# Patient Record
Sex: Female | Born: 1960
Health system: Southern US, Community
[De-identification: ages and names within clinical notes are randomized; demographics above are authoritative.]

## PROBLEM LIST (undated history)

## (undated) DIAGNOSIS — M539 Dorsopathy, unspecified: Secondary | ICD-10-CM

## (undated) DIAGNOSIS — K219 Gastro-esophageal reflux disease without esophagitis: Secondary | ICD-10-CM

## (undated) DIAGNOSIS — R1310 Dysphagia, unspecified: Secondary | ICD-10-CM

## (undated) DIAGNOSIS — Z95828 Presence of other vascular implants and grafts: Secondary | ICD-10-CM

## (undated) DIAGNOSIS — N3020 Other chronic cystitis without hematuria: Secondary | ICD-10-CM

## (undated) DIAGNOSIS — R42 Dizziness and giddiness: Secondary | ICD-10-CM

## (undated) DIAGNOSIS — G35 Multiple sclerosis: Secondary | ICD-10-CM

## (undated) DIAGNOSIS — R1319 Other dysphagia: Secondary | ICD-10-CM

## (undated) DIAGNOSIS — G43909 Migraine, unspecified, not intractable, without status migrainosus: Secondary | ICD-10-CM

## (undated) DIAGNOSIS — Z978 Presence of other specified devices: Secondary | ICD-10-CM

## (undated) DIAGNOSIS — R12 Heartburn: Secondary | ICD-10-CM

## (undated) DIAGNOSIS — I10 Essential (primary) hypertension: Secondary | ICD-10-CM

## (undated) DIAGNOSIS — K5900 Constipation, unspecified: Secondary | ICD-10-CM

## (undated) DIAGNOSIS — G709 Myoneural disorder, unspecified: Secondary | ICD-10-CM

## (undated) DIAGNOSIS — F419 Anxiety disorder, unspecified: Secondary | ICD-10-CM

## (undated) DIAGNOSIS — F329 Major depressive disorder, single episode, unspecified: Secondary | ICD-10-CM

## (undated) DIAGNOSIS — K200 Eosinophilic esophagitis: Secondary | ICD-10-CM

## (undated) DIAGNOSIS — F32A Depression, unspecified: Secondary | ICD-10-CM

## (undated) HISTORY — DX: Other dysphagia: R13.19

## (undated) HISTORY — DX: Heartburn: R12

## (undated) HISTORY — DX: Essential (primary) hypertension: I10

## (undated) HISTORY — DX: Constipation, unspecified: K59.00

## (undated) HISTORY — DX: Dysphagia, unspecified: R13.10

## (undated) HISTORY — DX: Eosinophilic esophagitis: K20.0

## (undated) HISTORY — DX: Major depressive disorder, single episode, unspecified: F32.9

## (undated) HISTORY — DX: Other chronic cystitis without hematuria: N30.20

## (undated) HISTORY — DX: Multiple sclerosis: G35

## (undated) HISTORY — DX: Dorsopathy, unspecified: M53.9

## (undated) HISTORY — DX: Gastro-esophageal reflux disease without esophagitis: K21.9

## (undated) HISTORY — DX: Depression, unspecified: F32.A

---
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
## 1995-12-08 HISTORY — PX: CARPAL TUNNEL RELEASE: SHX101

## 2000-12-07 HISTORY — PX: OVARIAN CYST REMOVAL: SHX89

## 2000-12-07 HISTORY — PX: CYSTECTOMY: SUR359

## 2000-12-07 HISTORY — PX: KIDNEY DONATION: SHX685

## 2001-12-07 HISTORY — PX: ABDOMINAL HYSTERECTOMY: SHX81

## 2012-12-07 HISTORY — PX: FRACTURE SURGERY: SHX138

## 2014-11-19 ENCOUNTER — Ambulatory Visit: Payer: Self-pay | Admitting: Gastroenterology

## 2014-11-19 DIAGNOSIS — K200 Eosinophilic esophagitis: Secondary | ICD-10-CM

## 2014-11-19 HISTORY — DX: Eosinophilic esophagitis: K20.0

## 2014-11-19 HISTORY — PX: UPPER GASTROINTESTINAL ENDOSCOPY: SHX188

## 2014-12-03 ENCOUNTER — Ambulatory Visit: Payer: Self-pay | Admitting: Orthopedic Surgery

## 2014-12-24 DIAGNOSIS — R262 Difficulty in walking, not elsewhere classified: Secondary | ICD-10-CM | POA: Insufficient documentation

## 2014-12-24 DIAGNOSIS — G4700 Insomnia, unspecified: Secondary | ICD-10-CM | POA: Insufficient documentation

## 2014-12-24 DIAGNOSIS — R5382 Chronic fatigue, unspecified: Secondary | ICD-10-CM

## 2014-12-24 DIAGNOSIS — IMO0001 Reserved for inherently not codable concepts without codable children: Secondary | ICD-10-CM | POA: Insufficient documentation

## 2014-12-24 DIAGNOSIS — G4701 Insomnia due to medical condition: Secondary | ICD-10-CM | POA: Insufficient documentation

## 2014-12-24 DIAGNOSIS — D8989 Other specified disorders involving the immune mechanism, not elsewhere classified: Secondary | ICD-10-CM | POA: Insufficient documentation

## 2014-12-24 DIAGNOSIS — R5383 Other fatigue: Secondary | ICD-10-CM | POA: Insufficient documentation

## 2014-12-24 DIAGNOSIS — R413 Other amnesia: Secondary | ICD-10-CM | POA: Insufficient documentation

## 2015-01-07 ENCOUNTER — Ambulatory Visit: Payer: Self-pay | Admitting: Neurology

## 2015-01-16 ENCOUNTER — Emergency Department: Payer: Self-pay | Admitting: Emergency Medicine

## 2015-05-10 ENCOUNTER — Other Ambulatory Visit: Payer: Self-pay

## 2015-05-10 MED ORDER — DEXLANSOPRAZOLE 60 MG PO CPDR: 60.0000 mg | DELAYED_RELEASE_CAPSULE | Freq: Every day | ORAL | Status: DC

## 2015-05-10 NOTE — Telephone Encounter (Signed)
E-prescribe within allscripts had sent auto refill message for this patient. Sent to pharmacy at this time.

## 2015-05-14 ENCOUNTER — Ambulatory Visit: Payer: Self-pay | Admitting: Licensed Clinical Social Worker

## 2015-05-20 ENCOUNTER — Ambulatory Visit: Payer: Self-pay | Admitting: Psychiatry

## 2015-05-21 DIAGNOSIS — K200 Eosinophilic esophagitis: Secondary | ICD-10-CM

## 2015-05-24 ENCOUNTER — Other Ambulatory Visit: Payer: Self-pay

## 2015-05-24 DIAGNOSIS — R1310 Dysphagia, unspecified: Secondary | ICD-10-CM | POA: Insufficient documentation

## 2015-05-24 DIAGNOSIS — G35 Multiple sclerosis: Secondary | ICD-10-CM | POA: Insufficient documentation

## 2015-05-24 DIAGNOSIS — F32A Depression, unspecified: Secondary | ICD-10-CM | POA: Insufficient documentation

## 2015-05-24 DIAGNOSIS — K219 Gastro-esophageal reflux disease without esophagitis: Secondary | ICD-10-CM | POA: Insufficient documentation

## 2015-05-24 DIAGNOSIS — M9979 Connective tissue and disc stenosis of intervertebral foramina of abdomen and other regions: Secondary | ICD-10-CM | POA: Insufficient documentation

## 2015-05-24 DIAGNOSIS — F329 Major depressive disorder, single episode, unspecified: Secondary | ICD-10-CM | POA: Insufficient documentation

## 2015-05-24 DIAGNOSIS — R1319 Other dysphagia: Secondary | ICD-10-CM | POA: Insufficient documentation

## 2015-05-24 DIAGNOSIS — N3020 Other chronic cystitis without hematuria: Secondary | ICD-10-CM | POA: Insufficient documentation

## 2015-05-24 DIAGNOSIS — K5900 Constipation, unspecified: Secondary | ICD-10-CM | POA: Insufficient documentation

## 2015-05-24 DIAGNOSIS — I10 Essential (primary) hypertension: Secondary | ICD-10-CM | POA: Insufficient documentation

## 2015-05-27 ENCOUNTER — Ambulatory Visit: Payer: Self-pay | Admitting: Urgent Care

## 2015-06-04 ENCOUNTER — Ambulatory Visit: Payer: 59 | Admitting: Licensed Clinical Social Worker

## 2015-06-14 ENCOUNTER — Encounter: Payer: Self-pay | Admitting: Urgent Care

## 2015-06-14 ENCOUNTER — Ambulatory Visit (INDEPENDENT_AMBULATORY_CARE_PROVIDER_SITE_OTHER): Payer: 59 | Admitting: Urgent Care

## 2015-06-14 VITALS — BP 125/88 | HR 99 | Temp 98.3°F | Ht 65.0 in | Wt 178.2 lb

## 2015-06-14 DIAGNOSIS — K200 Eosinophilic esophagitis: Secondary | ICD-10-CM | POA: Diagnosis not present

## 2015-06-14 DIAGNOSIS — K5901 Slow transit constipation: Secondary | ICD-10-CM | POA: Diagnosis not present

## 2015-06-14 DIAGNOSIS — K2100 Gastro-esophageal reflux disease with esophagitis, without bleeding: Secondary | ICD-10-CM

## 2015-06-14 MED ORDER — DEXLANSOPRAZOLE 60 MG PO CPDR: 60.0000 mg | DELAYED_RELEASE_CAPSULE | Freq: Every day | ORAL | Status: DC

## 2015-06-14 MED ORDER — POLYETHYLENE GLYCOL POWD: 17.0000 g | Freq: Two times a day (BID) | Status: DC

## 2015-06-14 NOTE — Assessment & Plan Note (Signed)
Resume dexilant 60 mg daily

## 2015-06-14 NOTE — Progress Notes (Signed)
Primary Care Physician: Carlean Jews, NP Primary Gastroenterologist:  Dr Servando Snare  Chief Complaint  Patient presents with  . Follow-up    Eosinophilic Esophagitis    HPI: Rose Clements is a 54 y.o. female here for follow up of eosinophilic esophagitis. She is doing well after prednisone taper. She feels much better. Her dysphagia has improved. Eyes any chest pain. She does have occasional heartburn. She is mid she ran out of Dexilant and was having insurance problems with getting it. She doesn't prescription she has new insurance. She still occasionally has hoarseness. She has been to see an allergist and serum testing was thus far has revealed no allergies. She has been taking MiraLAX twice daily for constipation and this is working well. She denies abdominal pain, nausea, or vomiting. Her appetite has been fine.  Current Outpatient Prescriptions  Medication Sig Dispense Refill  . amitriptyline (ELAVIL) 50 MG tablet Take 50 mg by mouth at bedtime.    . Armodafinil (NUVIGIL) 250 MG tablet Take 250 mg by mouth daily.    Marland Kitchen aspirin 81 MG tablet Take 81 mg by mouth daily.    . baclofen (LIORESAL) 20 MG tablet Take 20 mg by mouth 4 (four) times daily.    . Cholecalciferol (VITAMIN D3) 1000 UNITS CAPS Take by mouth daily.    . Cranberry (THERACRAN PO) Take 1 capsule by mouth 2 (two) times daily.    Marland Kitchen dexlansoprazole (DEXILANT) 60 MG capsule Take 1 capsule (60 mg total) by mouth daily. 30 capsule 0  . escitalopram (LEXAPRO) 10 MG tablet Take 1 tablet by mouth daily.  0  . fluticasone (FLONASE) 50 MCG/ACT nasal spray Place 2 sprays into both nostrils daily.    . hydrOXYzine (VISTARIL) 25 MG capsule Take 1 capsule by mouth daily.  0  . lactobacillus acidophilus (BACID) TABS tablet Take 2 tablets by mouth daily.    Marland Kitchen MYRBETRIQ 50 MG TB24 tablet Take 1 tablet by mouth daily.  0  . polyethylene glycol (MIRALAX / GLYCOLAX) packet Take 17 g by mouth daily.    . polyethylene glycol powder  (GLYCOLAX/MIRALAX) powder Take 1 g by mouth daily.  0  . vitamin C (ASCORBIC ACID) 500 MG tablet Take 500 mg by mouth daily.    Marland Kitchen dexlansoprazole (DEXILANT) 60 MG capsule Take 1 capsule (60 mg total) by mouth daily. 90 capsule 3  . Polyethylene Glycol POWD Take 17 g by mouth 2 (two) times daily. 1054 g 5   No current facility-administered medications for this visit.    Allergies as of 06/14/2015  . (No Known Allergies)    Review of Systems: Gen: + Fatigue that is chronic with her MS Denies any fever, chills, malaise ENT: Negative for diifficulty swallowing , nasal congestion CV: Denies chest pain, angina, palpitations, syncope, orthopnea, PND, peripheral edema, and claudication. Resp: Denies dyspnea at rest, dyspnea with exercise, cough, sputum, wheezing, coughing up blood, and pleurisy. GI: See HPI GU:  Negative for dysuria, hematuria, urinary incontinence, urinary frequency, nocturnal urination.  Endo: Negative for unusual weight change or sweats Derm: Denies jaundice, rash, itching, or unhealing ulcers.  Psych: Denies depression, anxiety, memory loss, suicidal ideation, hallucinations, paranoia, and confusion. Heme: Denies bruising, bleeding, and enlarged lymph nodes.   Physical Examination:  BP 125/88 mmHg  Pulse 99  Temp(Src) 98.3 F (36.8 C) (Oral)  Ht  (1.651 m)  Wt 178 lb 3.2 oz (80.831 kg)  BMI 29.65 kg/m2 Body mass index is 29.65 kg/(m^2). No LMP recorded.  Patient has had a hysterectomy. General:   Alert,  Well-developed, well-nourished, pleasant and cooperative in NAD Head:  Normocephalic and atraumatic. Eyes:  Sclera clear, no icterus.   Conjunctiva pink. Mouth:  No deformity or lesions.  Oropharynx pink & moist. Neck:  Supple; no masses or thyromegaly. Heart:  Regular rate and rhythm; no murmurs, clicks, rubs,  or gallops. Abdomen:   Normal bowel sounds.  Soft, nontender and nondistended. No masses, hepatosplenomegaly or hernias noted. No guarding or rebound  tenderness.   Msk:  Symmetrical without gross deformities. Normal posture. Pulses:  Normal pulses noted. Extremities:  Without clubbing or edema. Neurologic:  Alert and  oriented x3;  grossly normal neurologically. Skin:  Intact without significant lesions or rashes. Cervical Nodes:  No significant cervical adenopathy. Psych:  Alert and cooperative. Normal mood and affect.

## 2015-06-14 NOTE — Assessment & Plan Note (Signed)
Doing well on MiraLAX 17 g twice a day, will refill

## 2015-06-14 NOTE — Assessment & Plan Note (Signed)
Great response with steroids.  She is doing very well. No dysphagia or chest pain at this time.

## 2015-06-14 NOTE — Patient Instructions (Signed)
Begin Dexilant 60mg  daily Followuo with bone density testing in future with your PCP Miralax 17 grams BID

## 2015-07-01 ENCOUNTER — Encounter: Payer: Self-pay | Admitting: Licensed Clinical Social Worker

## 2015-07-01 ENCOUNTER — Ambulatory Visit (INDEPENDENT_AMBULATORY_CARE_PROVIDER_SITE_OTHER): Payer: 59 | Admitting: Licensed Clinical Social Worker

## 2015-07-01 DIAGNOSIS — F331 Major depressive disorder, recurrent, moderate: Secondary | ICD-10-CM
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
## 2015-07-01 NOTE — Progress Notes (Signed)
THERAPIST PROGRESS NOTE  Session Time: 10:10 a.m. - 11:20 a.m.  Participation Level: Active  Behavioral Response: NeatAlertAnxious and Depressed  Type of Therapy: Individual Therapy  Treatment Goals addressed: Coping  Interventions: Solution Focused, Strength-based and Supportive  Summary: Rose Clements is a 54 y.o. female who has a diagnosis of MS and who sought services in this clinic for treatment of her anxiety and depression. Moods were reportedly more down over the last week yet are directly related to ongoing grief reactions combined with client's appropriate struggles with adapting to physical/functional losses due to MS and other health conditions.  Today is her mother's birthday.  She has been deceased for 7 years.  Client talked about feeling triggered around certain dates in terms of her grief.  Rose Clements also shared with LCSW:  "I get frustrated with myself. When I can't remember what I'm trying to say and dealing with some people on the phone."  "I'm still having those emotions."     Client was unable to see LCSW for a while as a result of insurance benefits. Unfortunately she also ran out of refills on her psychotropic medications and described what sounds to be withdrawal symptoms.  Her PCP has been helping her with medications and client is scheduled to return for medication management with Rose Clements later this week.  Spouse started a new job. She has had to drive self to appointments and sometimes feels a bit uncomfortable about this.  Will consider using [facility].  Rose Clements is client's case manager with her insurance company and she has been helpful in linking client to services and appropriate medical professionals.  Due to relapses, she was referred to a specialist at [facility] in [facility], Rose Clements;  She had to give self IV Prednisone following relapses and talked more today about her MS diagnosis which is considered to be the primary and progressive type.  Client reported that  she is eating a bit more but still unable to eat meats. Enjoying more foods without having to puree these any longer. Starting to gain some weight.  Client is learning more about what triggers her MS and other symptoms. She talked about how she has had to do an elimination diet.  Client was taken off of one of the MS medications in June because she was having the relapses and voiced appropriate nervousness about new medication that she will receive within the next few weeks.  This is done as an infusion and she will be doing this at the [facility].  Rose Clements has connected with a [employer] and is getting to know her neighbors and shared how they have visited and helped her out when she had her flare ups.  She is in a Bible Study group that is stopped for the Summer. Spouse, Rose Clements, is a truck driver so he is gone through the week and home on the week-ends.  Per client, he checks on her frequently by phone as does her sister and daughter.  She talked about possibility that her sister and brother in law will move to the area from [place] after a recent visit and their positive experience with the area.  Moving forward, client admits that "I feel safe coming here. I know I can say anything."  Her faith and increasing social contacts are helping her to cope and to distract herself.  Rose Clements with good insight that too much alone time results in racing negative thoughts.  She was very receptive to information and strategies  discussed today during session.  GOALS:  "Getting things out that I can't talk about with others."  Suicidal/Homicidal: Negativewithout intent/plan  Therapist Response:   LCSW normalized client's voiced feelings and stressors associated with chronic medical condition.  Help client to further identify and express feelings connected with life challenges and losses as a result of their medical condition.  Assist in developing awareness of cognitive messages that reinforce hopelessness and  helplessness and assist in self recognition of healthier cognitive messages that enhance self-confidence and improve coping strategies.  LCSW provided client with supportive therapy with insight along with emotional and social support to reinforce client's strengths and available resources.  Encouraged calls to clinic between appointments PRN.  Plan: Return again in two weeks.  Develop specific goals and interventions with client to keep her focused on personal goals.  Diagnosis: Major Depressive Disorder, Recurrent, Moderate   Anxiety   Rose Jan, LCSW 07/01/2015

## 2015-07-04 ENCOUNTER — Encounter: Payer: Self-pay | Admitting: Psychiatry

## 2015-07-04 ENCOUNTER — Ambulatory Visit (INDEPENDENT_AMBULATORY_CARE_PROVIDER_SITE_OTHER): Payer: 59 | Admitting: Psychiatry

## 2015-07-04 VITALS — BP 142/88 | HR 84 | Temp 97.5°F | Ht 65.0 in | Wt 183.2 lb

## 2015-07-04 DIAGNOSIS — F331 Major depressive disorder, recurrent, moderate: Secondary | ICD-10-CM

## 2015-07-04 MED ORDER — SERTRALINE HCL 25 MG PO TABS: ORAL_TABLET | ORAL | Status: DC

## 2015-07-04 MED ORDER — HYDROXYZINE PAMOATE 25 MG PO CAPS: ORAL_CAPSULE | ORAL | Status: DC

## 2015-07-04 NOTE — Progress Notes (Signed)
BH MD/PA/NP OP Progress Note  07/04/2015 10:44 AM Toni Demo  MRN:  [id number]  Subjective:  Patient returns for follow-up of her major depressive disorder, moderate, recurrent. She states that overall nothing has changed significantly. She states she still experiences depressive episodes she describes as being perhaps 2 days in length. She endorses symptoms of anhedonia, lack of motivation and depressed mood. She denies any suicidal ideation. She states that what predominates her life are her medical issues and getting treatment for her MS. She states that she had not been for a while as there were some issues related to insurance/[employer] but she relates now that has been straightened out.  He has continued her escitalopram since her last visit. He saw her primary care nurse practitioner on July 11 2 increased her dose from 10 mg daily to 15 mg daily. Patient states she has not noticed significant benefit in this medication since starting it in April 2016. She does state that the hydroxyzine she's been taking two 25 mg capsules at bedtime and she states it has had a moderate effect on her ability to sleep. She states she has not been taking it in the daytime because she was not sure how to take it as Clinical research associate had written that she could take 1-2 tablets in the daytime. Chief Complaint:  Chief Complaint    Follow-up; Medication Refill; Anxiety; Depression; Fatigue; Panic Attack     Visit Diagnosis:  No diagnosis found.  Past Medical History:  Past Medical History  Diagnosis Date  . Multilevel degenerative disc disease   . Depression   . GERD (gastroesophageal reflux disease)   . Hypertension   . Esophageal dysphagia   . Heartburn   . Dysphagia     Dx by Beverely Risen on 10/15/14  . Multiple sclerosis   . Cystitis bacillary, chronic   . Eosinophilic esophagitis 11/19/2014    GE junction benign stricture s/p balloon dilation, retained food contents. ?gastroparesis versus secondary to EE.  .  Constipation     Past Surgical History  Procedure Laterality Date  . Tonsillectomy  1973  . Cesarean section  1989  . Carpal tunnel release Right 1997  . Cystectomy  2002    From chest  . Ovarian cyst removal Left 2002    Ovarian mass removal  . Abdominal hysterectomy  2003  . Upper gastrointestinal endoscopy  11/19/2014    Benign appearing esophageal stricture-Dilated.  . Kidney donation Left 2002   Family History:  Family History  Problem Relation Age of Onset  . Diabetes Mother   . Hypertension Mother   . Heart disease Mother   . Pulmonary embolism Mother   . Diabetes Father   . Diabetes Sister   . Hypertension Sister   . Heart disease Sister   . Bipolar disorder Sister   . Heart attack Maternal Grandmother   . Pancreatic cancer Maternal Grandfather   . Narcolepsy Brother    Social History:  History   Social History  . Marital Status: Married    Spouse Name: N/A  . Number of Children: N/A  . Years of Education: N/A   Social History Main Topics  . Smoking status: Never Smoker   . Smokeless tobacco: Never Used  . Alcohol Use: No  . Drug Use: No  . Sexual Activity: No   Other Topics Concern  . None   Social History Narrative   Additional History:   Assessment:   Musculoskeletal: Strength & Muscle Tone: Patient ambulates slowly with  a cane. Gait & Station: Slow and with cane Patient leans: N/A  Psychiatric Specialty Exam: HPI  Review of Systems  Psychiatric/Behavioral: Positive for depression. Negative for suicidal ideas, hallucinations, memory loss and substance abuse. The patient is nervous/anxious and has insomnia.     Blood pressure 142/94, pulse 84, temperature 97.5 F (36.4 C), temperature source Tympanic, height 5\' 5"  (1.651 m), weight 183 lb 3.2 oz (83.099 kg), SpO2 98 %.Body mass index is 30.49 kg/(m^2).  General Appearance: Neat and Well Groomed  Eye Contact:  Good  Speech:  Clear and Coherent  Volume:  Normal  Mood:  Same  Affect:   Patient was slightly bright, she was tearful when discussing the recent July anniversary of her mother's death in Apr 29, 2008  Thought Process:  Linear and Logical  Orientation:  Full (Time, Place, and Person)  Thought Content:  Negative  Suicidal Thoughts:  No  Homicidal Thoughts:  No  Memory:  Immediate;   Good Recent;   Good Remote;   Good  Judgement:  Good  Insight:  Good  Psychomotor Activity:  Negative  Concentration:  Good  Recall:  Good  Fund of Knowledge: Good  Language: Good  Akathisia:  Negative  Handed:  Right unknown   AIMS (if indicated):  N/A  Assets:  Communication Skills Desire for Improvement Social Support  ADL's:  Intact  Cognition: WNL  Sleep:  Fair with Vistaril    Is the patient at risk to self?  No. Has the patient been a risk to self in the past 6 months?  No. Has the patient been a risk to self within the distant past?  No. Is the patient a risk to others?  No. Has the patient been a risk to others in the past 6 months?  No. Has the patient been a risk to others within the distant past?  No.  Current Medications: Current Outpatient Prescriptions  Medication Sig Dispense Refill  . amitriptyline (ELAVIL) 50 MG tablet Take 50 mg by mouth at bedtime.    . Armodafinil (NUVIGIL) 250 MG tablet Take 250 mg by mouth daily.    Marland Kitchen aspirin 81 MG tablet Take 81 mg by mouth daily.    . baclofen (LIORESAL) 20 MG tablet Take 20 mg by mouth 4 (four) times daily.    . Cholecalciferol (VITAMIN D3) 1000 UNITS CAPS Take by mouth daily.    . Cranberry (THERACRAN PO) Take 1 capsule by mouth 2 (two) times daily.    Marland Kitchen dexlansoprazole (DEXILANT) 60 MG capsule Take 1 capsule (60 mg total) by mouth daily. 30 capsule 0  . escitalopram (LEXAPRO) 10 MG tablet Take 1.5 tablets by mouth daily.    . fluticasone (FLONASE) 50 MCG/ACT nasal spray Place 2 sprays into both nostrils daily.    . hydrALAZINE (APRESOLINE) 25 MG tablet Take by mouth.    . hydrOXYzine (VISTARIL) 25 MG capsule  During day can take one capsule twice daily for anxiety, take four capsules at bedtime for sleep. 180 capsule 1  . lactobacillus acidophilus (BACID) TABS tablet Take 2 tablets by mouth daily.    Marland Kitchen MYRBETRIQ 50 MG TB24 tablet Take 1 tablet by mouth daily.  0  . nystatin cream (MYCOSTATIN) apply to affected area twice a day for 7 days then if needed for yeast infection  0  . polyethylene glycol (MIRALAX / GLYCOLAX) packet Take 17 g by mouth daily.    . sertraline (ZOLOFT) 25 MG tablet Take one half a tablet in the morning  for 7 days then increase to one whole tablet in the morning. 30 tablet 1   No current facility-administered medications for this visit.    Medical Decision Making:  Established Problem, Stable/Improving (1), Review of Medication Regimen & Side Effects (2) and Review of New Medication or Change in Dosage (2)  Treatment Plan Summary:Medication management and Plan We will discontinue her escitalopram. We will start sertraline 25 mg in the morning for 7 days and then she will increase to 50 mg in the morning. She will continue her Vistaril however I have written her dosing to be 25 mg twice a day as needed for anxiety and 100 mg at bedtime for insomnia. Patient will follow up in 1 month. She's been encouraged colony questions concerns prior to her next appointment. Patient is continuing with therapy.   Wallace Going 07/04/2015, 10:44 AM

## 2015-07-15 ENCOUNTER — Ambulatory Visit (INDEPENDENT_AMBULATORY_CARE_PROVIDER_SITE_OTHER): Payer: 59 | Admitting: Licensed Clinical Social Worker

## 2015-07-15 ENCOUNTER — Ambulatory Visit: Payer: Self-pay

## 2015-07-15 ENCOUNTER — Ambulatory Visit: Payer: Self-pay | Admitting: Family Medicine

## 2015-07-15 DIAGNOSIS — F331 Major depressive disorder, recurrent, moderate: Secondary | ICD-10-CM
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
## 2015-07-16 ENCOUNTER — Encounter: Payer: Self-pay | Admitting: Family Medicine

## 2015-07-16 ENCOUNTER — Inpatient Hospital Stay: Payer: 59 | Attending: Family Medicine | Admitting: Family Medicine

## 2015-07-16 ENCOUNTER — Inpatient Hospital Stay: Payer: 59

## 2015-07-16 VITALS — BP 134/85 | HR 81 | Temp 97.3°F | Resp 16 | Ht 67.13 in | Wt 181.7 lb

## 2015-07-16 VITALS — BP 134/75 | HR 80 | Temp 97.4°F | Resp 16

## 2015-07-16 DIAGNOSIS — K219 Gastro-esophageal reflux disease without esophagitis: Secondary | ICD-10-CM | POA: Insufficient documentation

## 2015-07-16 DIAGNOSIS — G35 Multiple sclerosis: Secondary | ICD-10-CM

## 2015-07-16 DIAGNOSIS — F329 Major depressive disorder, single episode, unspecified: Secondary | ICD-10-CM | POA: Diagnosis not present

## 2015-07-16 DIAGNOSIS — Z79899 Other long term (current) drug therapy: Secondary | ICD-10-CM | POA: Diagnosis not present

## 2015-07-16 DIAGNOSIS — I10 Essential (primary) hypertension: Secondary | ICD-10-CM | POA: Insufficient documentation

## 2015-07-16 HISTORY — DX: Multiple sclerosis: G35

## 2015-07-16 MED ORDER — SODIUM CHLORIDE 0.9 % IV SOLN
Freq: Once | INTRAVENOUS | Status: AC
  Administered 2015-07-16: 11:00:00 via INTRAVENOUS
  Filled 2015-07-16: qty 1000

## 2015-07-16 MED ORDER — SODIUM CHLORIDE 0.9 % IV SOLN
300.0000 mg | Freq: Once | INTRAVENOUS | Status: AC
  Administered 2015-07-16: 300 mg via INTRAVENOUS
  Filled 2015-07-16: qty 15

## 2015-07-16 MED ORDER — ACETAMINOPHEN 500 MG PO TABS
1000.0000 mg | ORAL_TABLET | Freq: Once | ORAL | Status: AC
  Administered 2015-07-16: 1000 mg via ORAL
  Filled 2015-07-16: qty 2

## 2015-07-16 NOTE — Progress Notes (Signed)
[facility]  [phone]  [phone]     Rose Clements DOB: May 11, 1961  MR#: [id number]  CWU#:[id number]  Patient Care Team: Carlean Jews, NP as PCP - General (Family Medicine)  CHIEF COMPLAINT:  Chief Complaint  Patient presents with  . Follow-up    Is here today for Tysabri infusion...   Patient with past medical history including multiple sclerosis, hypertension, depression, anxiety, arthritis, bulging lumbar intravertebral discs.  INTERVAL HISTORY:  Patient is here to establish care in order to receive Tysabri infusions for treatment of multiple sclerosis. She is currently under the primary care neurologist Dr. Malvin Johns extranodal clinic in [place]. She does see a specialist, Dr. Primitivo Gauze with [facility]. Patient understands that we are here for infusion services only. She denies any acute complaints or issues at this time.  REVIEW OF SYSTEMS:   Review of Systems  Constitutional: Negative for fever, chills, weight loss, malaise/fatigue and diaphoresis.  HENT: Negative for congestion, ear discharge, ear pain, hearing loss, nosebleeds, sore throat and tinnitus.   Eyes: Negative for blurred vision, double vision, photophobia, pain, discharge and redness.  Respiratory: Negative for cough, hemoptysis, sputum production, shortness of breath, wheezing and stridor.   Cardiovascular: Negative for chest pain, palpitations, orthopnea, claudication, leg swelling and PND.  Gastrointestinal: Negative for heartburn, nausea, vomiting, abdominal pain, diarrhea, constipation, blood in stool and melena.  Genitourinary: Negative.   Musculoskeletal: Negative.   Skin: Negative.   Neurological: Positive for focal weakness and weakness. Negative for dizziness, tingling, seizures and headaches.       MS  Endo/Heme/Allergies: Does not bruise/bleed easily.  Psychiatric/Behavioral: Negative for depression. The patient is not nervous/anxious and does not have  insomnia.     As per HPI. Otherwise, a complete review of systems is negatve.   PAST MEDICAL HISTORY: Past Medical History  Diagnosis Date  . Multilevel degenerative disc disease   . Depression   . GERD (gastroesophageal reflux disease)   . Hypertension   . Esophageal dysphagia   . Heartburn   . Dysphagia     Dx by Beverely Risen on 10/15/14  . Multiple sclerosis   . Cystitis bacillary, chronic   . Eosinophilic esophagitis 11/19/2014    GE junction benign stricture s/p balloon dilation, retained food contents. ?gastroparesis versus secondary to EE.  . Constipation   . Multiple sclerosis 07/16/2015    PAST SURGICAL HISTORY: Past Surgical History  Procedure Laterality Date  . Tonsillectomy  1973  . Cesarean section  1989  . Carpal tunnel release Right 1997  . Cystectomy  2002    From chest  . Ovarian cyst removal Left 2002    Ovarian mass removal  . Abdominal hysterectomy  2003  . Upper gastrointestinal endoscopy  11/19/2014    Benign appearing esophageal stricture-Dilated.  . Kidney donation Left 2002    FAMILY HISTORY Family History  Problem Relation Age of Onset  . Diabetes Mother   . Hypertension Mother   . Heart disease Mother   . Pulmonary embolism Mother   . Diabetes Father   . Diabetes Sister   . Hypertension Sister   . Heart disease Sister   . Bipolar disorder Sister   . Heart attack Maternal Grandmother   . Pancreatic cancer Maternal Grandfather   . Narcolepsy Brother     GYNECOLOGIC HISTORY:  No LMP recorded. Patient has had a hysterectomy.     ADVANCED DIRECTIVES:    HEALTH MAINTENANCE: History  Substance Use Topics  . Smoking status:  Never Smoker   . Smokeless tobacco: Never Used  . Alcohol Use: No     Colonoscopy:  PAP:  Bone density:  Lipid panel:  No Known Allergies  Current Outpatient Prescriptions  Medication Sig Dispense Refill  . amitriptyline (ELAVIL) 50 MG tablet Take 50 mg by mouth at bedtime.    . Armodafinil (NUVIGIL)  250 MG tablet Take 250 mg by mouth daily.    Marland Kitchen aspirin 81 MG tablet Take 81 mg by mouth daily.    . baclofen (LIORESAL) 20 MG tablet Take 20 mg by mouth 4 (four) times daily.    . calcium carbonate (OS-CAL) 600 MG TABS tablet Take 600 mg by mouth 2 (two) times daily with a meal.    . Cholecalciferol (VITAMIN D3) 1000 UNITS CAPS Take 2 capsules by mouth daily.     . Cranberry (THERACRAN PO) Take 1 capsule by mouth 2 (two) times daily.    Marland Kitchen dexlansoprazole (DEXILANT) 60 MG capsule Take 1 capsule (60 mg total) by mouth daily. 30 capsule 0  . fluticasone (FLONASE) 50 MCG/ACT nasal spray Place 2 sprays into both nostrils daily.    . hydrALAZINE (APRESOLINE) 25 MG tablet Take by mouth.    . hydrOXYzine (VISTARIL) 25 MG capsule During day can take one capsule twice daily for anxiety, take four capsules at bedtime for sleep. 180 capsule 1  . lactobacillus acidophilus (BACID) TABS tablet Take 2 tablets by mouth daily.    Marland Kitchen MYRBETRIQ 50 MG TB24 tablet Take 1 tablet by mouth daily.  0  . nystatin cream (MYCOSTATIN) apply to affected area twice a day for 7 days then if needed for yeast infection  0  . polyethylene glycol (MIRALAX / GLYCOLAX) packet Take 17 g by mouth daily.    . sertraline (ZOLOFT) 25 MG tablet Take one half a tablet in the morning for 7 days then increase to one whole tablet in the morning. (Patient taking differently: Take 25 mg by mouth daily. ) 30 tablet 1  . escitalopram (LEXAPRO) 10 MG tablet Take 1.5 tablets by mouth daily.     No current facility-administered medications for this visit.    OBJECTIVE: BP 134/85 mmHg  Pulse 81  Temp(Src) 97.3 F (36.3 C) (Tympanic)  Resp 16  Ht 5' 7.13" (1.705 m)  Wt 181 lb 10.5 oz (82.399 kg)  BMI 28.34 kg/m2  SpO2 100%   Body mass index is 28.34 kg/(m^2).    ECOG FS:1 - Symptomatic but completely ambulatory  General: Well-developed, well-nourished, no acute distress. Eyes: Pink conjunctiva, anicteric sclera. HEENT: Normocephalic, moist  mucous membranes, clear oropharnyx. Lungs: Clear to auscultation bilaterally. Heart: Regular rate and rhythm. No rubs, murmurs, or gallops. Abdomen: Soft, nontender, nondistended. No organomegaly noted, normoactive bowel sounds. Musculoskeletal: Bilateral lower extremity edema 1+, cyanosis, or clubbing. Neuro: Alert, answering all questions appropriately. Cranial nerves grossly intact. Multiple sclerosis, uses cane for ambulatory aid. Skin: No rashes or petechiae noted. Psych: Normal affect.    LAB RESULTS:  No results found for any previous visit.  STUDIES: No results found.  ASSESSMENT:  Multiple sclerosis  PLAN:   1. MS. We'll proceed with Tysabri 300 mg infusions every 4 weeks. Patient has been advised to you questions regarding treatment or her disease should be directed to neurologist. All appropriate paperwork has been received and copy of prescription from Dr. Primitivo Gauze is on file. We'll schedule infusions every 4 weeks with next provider follow-up in approximately 6 months.  Patient expressed understanding and was in  agreement with this plan. She also understands that She can call clinic at any time with any questions, concerns, or complaints.   Dr. Orlie Dakin was available for consultation and review of plan of care for this patient.   Loann Quill, NP   07/16/2015 12:05 PM

## 2015-08-06 ENCOUNTER — Encounter: Payer: Self-pay | Admitting: Psychiatry

## 2015-08-06 ENCOUNTER — Ambulatory Visit (INDEPENDENT_AMBULATORY_CARE_PROVIDER_SITE_OTHER): Payer: 59 | Admitting: Licensed Clinical Social Worker

## 2015-08-06 ENCOUNTER — Ambulatory Visit (INDEPENDENT_AMBULATORY_CARE_PROVIDER_SITE_OTHER): Payer: 59 | Admitting: Psychiatry

## 2015-08-06 VITALS — BP 132/84 | HR 78 | Temp 97.7°F | Ht 65.0 in | Wt 192.4 lb

## 2015-08-06 DIAGNOSIS — F411 Generalized anxiety disorder: Secondary | ICD-10-CM | POA: Diagnosis not present

## 2015-08-06 DIAGNOSIS — F331 Major depressive disorder, recurrent, moderate: Secondary | ICD-10-CM

## 2015-08-06 MED ORDER — SERTRALINE HCL 50 MG PO TABS: 75.0000 mg | ORAL_TABLET | ORAL | Status: DC

## 2015-08-06 MED ORDER — CLONAZEPAM 0.5 MG PO TABS: 0.5000 mg | ORAL_TABLET | Freq: Two times a day (BID) | ORAL | Status: DC | PRN
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
## 2015-08-06 NOTE — Progress Notes (Signed)
BH MD/PA/NP OP Progress Note  08/06/2015 10:49 AM Rose Clements  MRN:  [id number]  Subjective:  Patient returns for follow-up of her major depressive disorder, moderate, recurrent. She states that overall nothing has changed significantly.  She has only been taking sertraline 25 mg. She states that the Vistaril has not been helpful to address her anxiety or insomnia. She states she did begin some infusion treatments or an mass and states that she is wondering whether it lapses given her some type of headaches. She states that probably happened 2 weeks ago whereas in the sertraline was started about 4 weeks ago. She was inquiring as to whether it was sertraline. I stated I was not sure but it does not appear that the onset of the headaches is contemporaneous with the sertraline.  She said  that she tends to over think things. She states that will then lead to some anxiety. She states that sometimes she reflects that she should not be that worried but that her mind just over thinks things.  Chief Complaint:  I have not really noticed a thing Chief Complaint    Anxiety; Follow-up; Depression; Fatigue     Visit Diagnosis:  No diagnosis found.  Past Medical History:  Past Medical History  Diagnosis Date  . Multilevel degenerative disc disease   . Depression   . GERD (gastroesophageal reflux disease)   . Hypertension   . Esophageal dysphagia   . Heartburn   . Dysphagia     Dx by Beverely Risen on 10/15/14  . Multiple sclerosis   . Cystitis bacillary, chronic   . Eosinophilic esophagitis 11/19/2014    GE junction benign stricture s/p balloon dilation, retained food contents. ?gastroparesis versus secondary to EE.  . Constipation   . Multiple sclerosis 07/16/2015    Past Surgical History  Procedure Laterality Date  . Tonsillectomy  1973  . Cesarean section  1989  . Carpal tunnel release Right 1997  . Cystectomy  2002    From chest  . Ovarian cyst removal Left 2002    Ovarian mass removal  .  Abdominal hysterectomy  2003  . Upper gastrointestinal endoscopy  11/19/2014    Benign appearing esophageal stricture-Dilated.  . Kidney donation Left 2002   Family History:  Family History  Problem Relation Age of Onset  . Diabetes Mother   . Hypertension Mother   . Heart disease Mother   . Pulmonary embolism Mother   . Diabetes Father   . Diabetes Sister   . Hypertension Sister   . Heart disease Sister   . Bipolar disorder Sister   . Heart attack Maternal Grandmother   . Pancreatic cancer Maternal Grandfather   . Narcolepsy Brother    Social History:  Social History   Social History  . Marital Status: Married    Spouse Name: N/A  . Number of Children: N/A  . Years of Education: N/A   Social History Main Topics  . Smoking status: Never Smoker   . Smokeless tobacco: Never Used  . Alcohol Use: No  . Drug Use: No  . Sexual Activity: No   Other Topics Concern  . None   Social History Narrative   Additional History:   Assessment:   Musculoskeletal: Strength & Muscle Tone: Patient ambulates slowly with a cane. Gait & Station: Slow and with cane Patient leans: N/A  Psychiatric Specialty Exam: Anxiety Symptoms include insomnia and nervous/anxious behavior. Patient reports no suicidal ideas.    Depression  Associated symptoms include insomnia.  Associated symptoms include no suicidal ideas.  Past medical history includes anxiety.     Review of Systems  Psychiatric/Behavioral: Positive for depression. Negative for suicidal ideas, hallucinations, memory loss and substance abuse. The patient is nervous/anxious and has insomnia.     Blood pressure 132/84, pulse 78, temperature 97.7 F (36.5 C), temperature source Tympanic, height  (1.651 m), weight 192 lb 6.4 oz (87.272 kg), SpO2 96 %.Body mass index is 32.02 kg/(m^2).  General Appearance: Neat and Well Groomed  Eye Contact:  Good  Speech:  Clear and Coherent  Volume:  Normal  Mood:  Same  Affect:   reactive, able to smile and joke about difficulty remembering what she wants to stay in the doctor's appointment  Thought Process:  Linear and Logical  Orientation:  Full (Time, Place, and Person)  Thought Content:  Negative  Suicidal Thoughts:  No  Homicidal Thoughts:  No  Memory:  Immediate;   Good Recent;   Good Remote;   Good  Judgement:  Good  Insight:  Good  Psychomotor Activity:  Negative  Concentration:  Good  Recall:  Good  Fund of Knowledge: Good  Language: Good  Akathisia:  Negative  Handed:  Right unknown   AIMS (if indicated):  N/A  Assets:  Communication Skills Desire for Improvement Social Support  ADL's:  Intact  Cognition: WNL  Sleep:  Fair with Vistaril    Is the patient at risk to self?  No. Has the patient been a risk to self in the past 6 months?  No. Has the patient been a risk to self within the distant past?  No. Is the patient a risk to others?  No. Has the patient been a risk to others in the past 6 months?  No. Has the patient been a risk to others within the distant past?  No.  Current Medications: Current Outpatient Prescriptions  Medication Sig Dispense Refill  . amitriptyline (ELAVIL) 50 MG tablet Take 50 mg by mouth at bedtime.    Marland Kitchen aspirin 81 MG tablet Take 81 mg by mouth daily.    . baclofen (LIORESAL) 20 MG tablet Take 20 mg by mouth 4 (four) times daily.    . calcium carbonate (OS-CAL) 600 MG TABS tablet Take 600 mg by mouth 2 (two) times daily with a meal.    . Cholecalciferol (VITAMIN D3) 1000 UNITS CAPS Take 2 capsules by mouth daily.     . Cranberry (THERACRAN PO) Take 1 capsule by mouth 2 (two) times daily.    Marland Kitchen dexlansoprazole (DEXILANT) 60 MG capsule Take 1 capsule (60 mg total) by mouth daily. 30 capsule 0  . fluticasone (FLONASE) 50 MCG/ACT nasal spray Place 2 sprays into both nostrils daily.    . hydrALAZINE (APRESOLINE) 25 MG tablet Take by mouth.    . hydrOXYzine (VISTARIL) 25 MG capsule During day can take one capsule twice  daily for anxiety, take four capsules at bedtime for sleep. 180 capsule 1  . lactobacillus acidophilus (BACID) TABS tablet Take 2 tablets by mouth daily.    . modafinil (PROVIGIL) 200 MG tablet Take 200 mg by mouth daily.  0  . MYRBETRIQ 50 MG TB24 tablet Take 1 tablet by mouth daily.  0  . nystatin cream (MYCOSTATIN) apply to affected area twice a day for 7 days then if needed for yeast infection  0  . polyethylene glycol (MIRALAX / GLYCOLAX) packet Take 17 g by mouth daily.    Marland Kitchen  sertraline (ZOLOFT) 50 MG tablet Take 1.5 tablets (75 mg total) by mouth every morning. 45 tablet 1  . clonazePAM (KLONOPIN) 0.5 MG tablet Take 1 tablet (0.5 mg total) by mouth 2 (two) times daily as needed for anxiety. 60 tablet 1   No current facility-administered medications for this visit.    Medical Decision Making:  Established Problem, Stable/Improving (1), Review of Medication Regimen & Side Effects (2) and Review of New Medication or Change in Dosage (2)  Treatment Plan Summary:Medication management and Plan We will increase her sertraline to 50 mg in the morning for 1 week and then she will go to 75 mg. I've written out instructions for her to do this using 2/25 mg tablets for 1 week and then going to 3 of her 25 mg tablets. She's aware that when she goes to the pharmacy for her next prescription she will get a 50 mg tablet and thus take 1-1/2 tablet.   We will discontinue the Vistaril she has not noticed any benefit even when she is gone to 75 mg dosing. We will start some clonazepam 0.5 mg twice a day as needed for anxiety. Risk and benefits of been discussing patient's able consent. She'll follow up in 1 month. She's been encouraged call any questions or concerns prior to her next point. Wallace Going 08/06/2015, 10:49 AM

## 2015-08-15 ENCOUNTER — Ambulatory Visit (INDEPENDENT_AMBULATORY_CARE_PROVIDER_SITE_OTHER): Payer: 59 | Admitting: Licensed Clinical Social Worker

## 2015-08-15 DIAGNOSIS — F411 Generalized anxiety disorder: Secondary | ICD-10-CM

## 2015-08-15 DIAGNOSIS — F331 Major depressive disorder, recurrent, moderate: Secondary | ICD-10-CM | POA: Diagnosis not present

## 2015-08-15 NOTE — Progress Notes (Signed)
THERAPIST PROGRESS NOTE  Session Time: 10:03 a.m. -  11:10 a.m.  Participation Level: Active  Behavioral Response: NeatAlertAnxious and Depressed  Type of Therapy: Individual Therapy  Treatment Goals addressed: Coping  Interventions: Solution Focused, Strength-based and Supportive  Summary: Rose Clements is a 54 y.o. female who [place] to [place] and stated "I still have my little moments but it doesn't seem as bad."   Client reported that she was stiff today and has had frequent headaches and various other symptoms of MS flare ups.  Will return for her second infusion of a new MS medication in about two weeks.  Appropriate concern voiced about recent confusion at medical appointment regarding what type of MS she actually has.  Enjoyed recent trip to see family in [place] over the past week-end. Celebrated her wedding anniversary at a favorite [employer] in [place] and was joined by family and one of her best friends.   A friend from [place] came back with client and her husband to visit for two weeks.  This friend is very helpful yet sometimes Rose Clements reports that she feels over-whelmed with the friends efforts to do things around her home.  The two have a long relationship so she feels comfortable telling friend when to stop doing certain things and is okay being alone in her room while friend is in the home.  Rose Clements was glad that her appetite has improved so that she could enjoy a cake from her favorite hometown bakery and the food during her visit home.  Overall she feels physically and emotionally improved since last year and spoke very positively about her move to this area and her medical providers stating "Everyone has been so nice and spot on."  During visit with family, they complicated her on how much better she appeared and was getting around and client shared with LCSW her overall physical and mental status this time last year.  One stressor is that she has still not been approved for her  SSDI benefits and is working with a firm, English as a second language teacher, and unable to have her case transferred to a local disability attorney due to this firm having been involved for going on four years now.  Much appropriate frustration voiced about the delay in her going before a Sharee Pimple and the time frame provided to her is as long as another 18 months.  "I just want my money that I worked for since I was 54 years old."  Rose Clements was very receptive to [employer] idea about contacting her representatives and possibly the [employer] office.  Interactions with her spouse have been better with the exception that he did not notify her that he had invited her friend in [place] to come and stay with her for two weeks.  She expressed her frustration to him and overall is feeling better about being more assertive in her communication versus repressing her feelings.  "I don't hold back on my opinions."  Her faith continues to be a source of strength for her and client with determination to live with her disease and with more understanding about her illness and accepting of her physical limitations.  No new concerns or goals identified in session.  Rose Clements continues to be engaged in the therapeutic process and reports that being able to talk to LCSW is helpful and that previously provided hand-outs are good references for her.  Suicidal/Homicidal: Negativewithout intent/plan   Therapist Response:   LCSW normalized client's voiced feelings and stressors associated with living  with an uncertain diagnosis and chronic medical condition. Reinforced those habits and characteristics that client possesses that assist her to be resilient and cope.   Again commended her on recognizing the importance of setting appropriate boundaries to care for herself emotionally and physically and reviewed the benefits of having a positive support network.   LCSW provided client with ongoing emotional and social support.  Provided her with a print out of the names and  contact information for local [employer] and Representatives and urged her to reach to these individuals regarding the delay of her SSDI hearing/receipt of benefits.  Encouraged calls to clinic between appointments PRN.  Plan: Return again in two weeks.  Client will take medications as prescribed.  She will notify this clinic and providers of new concerns and/or psychosocial needs.  Diagnosis: Major Depressive Disorder, Recurrent, Moderate   Anxiety [address]  Felecia Jan, [place] 08/15/2015

## 2015-08-16 DIAGNOSIS — F411 Generalized anxiety disorder: Secondary | ICD-10-CM | POA: Insufficient documentation

## 2015-08-21 ENCOUNTER — Inpatient Hospital Stay: Payer: Commercial Managed Care - PPO | Attending: Family Medicine

## 2015-08-21 VITALS — BP 108/60 | HR 80 | Temp 98.2°F

## 2015-08-21 DIAGNOSIS — Z79899 Other long term (current) drug therapy: Secondary | ICD-10-CM | POA: Insufficient documentation

## 2015-08-21 DIAGNOSIS — G35 Multiple sclerosis: Secondary | ICD-10-CM

## 2015-08-21 MED ORDER — ACETAMINOPHEN 500 MG PO TABS
1000.0000 mg | ORAL_TABLET | Freq: Once | ORAL | Status: AC
  Administered 2015-08-21: 1000 mg via ORAL
  Filled 2015-08-21: qty 2

## 2015-08-21 MED ORDER — SODIUM CHLORIDE 0.9 % IV SOLN
Freq: Once | INTRAVENOUS | Status: AC
  Administered 2015-08-21: 11:00:00 via INTRAVENOUS
  Filled 2015-08-21: qty 1000

## 2015-08-21 MED ORDER — SODIUM CHLORIDE 0.9 % IV SOLN
300.0000 mg | Freq: Once | INTRAVENOUS | Status: AC
  Administered 2015-08-21: 300 mg via INTRAVENOUS
  Filled 2015-08-21: qty 15

## 2015-09-05 ENCOUNTER — Ambulatory Visit (INDEPENDENT_AMBULATORY_CARE_PROVIDER_SITE_OTHER): Payer: 59 | Admitting: Licensed Clinical Social Worker

## 2015-09-05 DIAGNOSIS — F331 Major depressive disorder, recurrent, moderate: Secondary | ICD-10-CM

## 2015-09-05 DIAGNOSIS — F411 Generalized anxiety disorder: Secondary | ICD-10-CM | POA: Diagnosis not present

## 2015-09-05 NOTE — Progress Notes (Signed)
THERAPIST PROGRESS NOTE  Session Time: 11:15 a.m. - 12:30 p.m.  Participation Level: Active  Behavioral Response: CasualAlertAnxious and Depressed  Type of Therapy: Individual Therapy  Treatment Goals addressed: Anxiety, Communication: assertiveness and Coping  Interventions: Solution Focused, Strength-based, Assertiveness Training and Supportive  Summary: Rose Clements is a 54 y.o. female who presents with ongoing anxiety and mild to moderate depressive symptoms which Rose Clements identifies as responding to current medications.  Primary stressors around friend from [place] still being in the home after extending her visit beyond the originally proposed two weeks.  Spouse & her daughter have offered to talk to the friend yet Rose Clements has declined and wants to continue to handle this.  She shared with LCSW that she has been open with the friend that it is time for her to return to her home in [place] and client has also established boundaries with the friend who client experiences as overly attentive and to a degree invasive into what client needs and into client and spouse's personal business.  Rose Clements discussed a few conversations where she feels that she was able to assert herself and notify the friend that she was crossing a line with her behaviors.  She very much wants to do things her way and at a pace that she is comfortable with and often feels that the friend wants things to be done the friend's way.  Discussion around not wanting to be pitied rather respected for how and when she does things.  Based on examples shared, Rose Clements is advocating for herself and her needs.  Various communication patterns and behaviors that client feels stressed about were the theme of the session. Rose Clements has asked her Church for prayer over how she handles things and her reactions to her friends and family members.  "I sometimes worry that I'm being inappropriate. It's about not wanting to hurt someone's feelings."  Rose Clements voiced how she struggles  with putting her thoughts into words and has concerns about ". . . Saying it the wrong way."    Other stress is that her son in law just came out of the hospital for a flare up of his Lupus and daughter works and has been caring for him.  She and spouse will travel to [facility] to stay with their daughter & son-in-law for the Holidays in December and if she can receive her next transfusion/treatment for her MS in [facility], she may plan to visit a little longer.  Her SSDI is still pending and she is waiting for a hearing before the [place].  Appropriate frustration regarding this lengthy process was voiced.  Her original denial and reasons cited per the Sharee Pimple around this were discussed with LCSW today.  Rose Clements will have additional assessments/evaluations to determine her functional status and she now feels more comfortable that all of her medical information/providers can furnish her attorney with additional medical records/assessments for her SSDI hearing.  She continued voicing much satisfaction with her medical care since moving to [facility].  Physically she has better days yet has learned more about her physical limitations and paces herself and how much she does.  She continues to find strength in her faith and much support from her Elesa Hacker and [employer] members who check on client.  [name]'s family in [place] and her daughter along with several friends living out of state also call to check on her regularly.  Suicidal/Homicidal: Negativewithout intent/plan  Therapist Response:   Ongoing emotional and social support was provided and emphasized client's strengths  and encouraged client to decide what she needs to know about responding to people and/or situations that she feels uncomfortable about.   LCSW normalized client's voiced feelings and stressors associated with the uncertainties of her illness/symptoms and the appropriate challenges while adapting and adjusting to these uncertainties.   Again commended her on recognizing the  importance of setting appropriate boundaries to care for herself emotionally and physically and reviewed the benefits of finding the skills/mind set to accept people as they are while not compromising her own needs and values.  Discussed the role of disappointment and the potential negative impact on mood and stress if not accepted and dealt with.  Normalized her experiences and validated ongoing expression of feelings, struggles and needs.  Encouraged calls to clinic between appointments PRN.  Plan: Return again in two weeks.  Client will take medications as prescribed.  She will notify this clinic and providers of new concerns and/or psychosocial needs.  Diagnosis: Major Depressive Disorder, Recurrent, Moderate   Anxiety [address]  Felecia Jan, [place] 09/05/2015

## 2015-09-06 ENCOUNTER — Ambulatory Visit (INDEPENDENT_AMBULATORY_CARE_PROVIDER_SITE_OTHER): Payer: 59 | Admitting: Psychiatry

## 2015-09-06 ENCOUNTER — Encounter: Payer: Self-pay | Admitting: Psychiatry

## 2015-09-06 VITALS — BP 132/84 | HR 80 | Temp 97.8°F | Ht 65.0 in | Wt 190.0 lb

## 2015-09-06 DIAGNOSIS — F331 Major depressive disorder, recurrent, moderate: Secondary | ICD-10-CM | POA: Diagnosis not present

## 2015-09-06 DIAGNOSIS — F411 Generalized anxiety disorder: Secondary | ICD-10-CM | POA: Diagnosis not present

## 2015-09-06 MED ORDER — ZOLPIDEM TARTRATE 5 MG PO TABS: 5.0000 mg | ORAL_TABLET | Freq: Every evening | ORAL | Status: DC | PRN

## 2015-09-06 MED ORDER — SERTRALINE HCL 100 MG PO TABS: 100.0000 mg | ORAL_TABLET | ORAL | Status: DC

## 2015-09-06 NOTE — Progress Notes (Signed)
BH MD/PA/NP OP Progress Note  09/06/2015 11:16 AM Rose Clements  MRN:  [id number]  Subjective:  Patient returns for follow-up of her major depressive disorder, moderate, recurrent. She indicated that she feels like she is doing a little bit better. She is not really able to state how much better but she feels like her mood has improved to some extent. She states she continues to have difficulty sleeping. She states that she has some issues with word finding and attention and concentration. She wonders whether this is part of her MS or part of her depression.  She states the biggest issue right now as far social stressors is that she has a house guest that was supposed to stay for 2 weeks but now is planning on leaving around October 10 which would make the house guest stay a total of 6 weeks. She states initially her husband how to be a good idea for her to have help around the house but the visit is being too prolonged and the patient is feeling somewhat crowded by the presence of the visit her.  She reports some anxiety when she gets up in the morning. She states she's been taking Klonopin in the morning with good benefit. She reports that she has issues with insomnia and typically does not sleep well at all.  Chief Complaint:  A little better Chief Complaint    Follow-up; Medication Refill     Visit Diagnosis:  No diagnosis found.  Past Medical History:  Past Medical History  Diagnosis Date  . Multilevel degenerative disc disease   . Depression   . GERD (gastroesophageal reflux disease)   . Hypertension   . Esophageal dysphagia   . Heartburn   . Dysphagia     Dx by Beverely Risen on 10/15/14  . Multiple sclerosis   . Cystitis bacillary, chronic   . Eosinophilic esophagitis 11/19/2014    GE junction benign stricture s/p balloon dilation, retained food contents. ?gastroparesis versus secondary to EE.  . Constipation   . Multiple sclerosis 07/16/2015    Past Surgical History  Procedure  Laterality Date  . Tonsillectomy  1973  . Cesarean section  1989  . Carpal tunnel release Right 1997  . Cystectomy  2002    From chest  . Ovarian cyst removal Left 2002    Ovarian mass removal  . Abdominal hysterectomy  2003  . Upper gastrointestinal endoscopy  11/19/2014    Benign appearing esophageal stricture-Dilated.  . Kidney donation Left 2002   Family History:  Family History  Problem Relation Age of Onset  . Diabetes Mother   . Hypertension Mother   . Heart disease Mother   . Pulmonary embolism Mother   . Diabetes Father   . Diabetes Sister   . Hypertension Sister   . Heart disease Sister   . Bipolar disorder Sister   . Heart attack Maternal Grandmother   . Pancreatic cancer Maternal Grandfather   . Narcolepsy Brother    Social History:  Social History   Social History  . Marital Status: Married    Spouse Name: N/A  . Number of Children: N/A  . Years of Education: N/A   Social History Main Topics  . Smoking status: Never Smoker   . Smokeless tobacco: Never Used  . Alcohol Use: No  . Drug Use: No  . Sexual Activity: No   Other Topics Concern  . Not on file   Social History Narrative   Additional History:   Assessment:  Musculoskeletal: Strength & Muscle Tone: Patient ambulates slowly with a cane. Gait & Station: Slow and with cane Patient leans: N/A  Psychiatric Specialty Exam: Anxiety Symptoms include insomnia and nervous/anxious behavior. Patient reports no suicidal ideas.    Depression        Associated symptoms include insomnia.  Associated symptoms include no suicidal ideas.  Past medical history includes anxiety.     Review of Systems  Psychiatric/Behavioral: Positive for depression (reports slight improvement). Negative for suicidal ideas, hallucinations, memory loss and substance abuse. The patient is nervous/anxious and has insomnia.     There were no vitals taken for this visit.There is no weight on file to calculate BMI.   General Appearance: Neat and Well Groomed  Eye Contact:  Good  Speech:  Clear and Coherent  Volume:  Normal  Mood:  A little better  Affect:  a little brighter  Thought Process:  Linear and Logical  Orientation:  Full (Time, Place, and Person)  Thought Content:  Negative  Suicidal Thoughts:  No  Homicidal Thoughts:  No  Memory:  Immediate;   Good Recent;   Good Remote;   Good  Judgement:  Good  Insight:  Good  Psychomotor Activity:  Negative  Concentration:  Good  Recall:  Good  Fund of Knowledge: Good  Language: Good  Akathisia:  Negative  Handed:  Right unknown   AIMS (if indicated):  N/A  Assets:  Communication Skills Desire for Improvement Social Support  ADL's:  Intact  Cognition: WNL  Sleep:  Fair with Vistaril    Is the patient at risk to self?  No. Has the patient been a risk to self in the past 6 months?  No. Has the patient been a risk to self within the distant past?  No. Is the patient a risk to others?  No. Has the patient been a risk to others in the past 6 months?  No. Has the patient been a risk to others within the distant past?  No.  Current Medications: Current Outpatient Prescriptions  Medication Sig Dispense Refill  . amitriptyline (ELAVIL) 50 MG tablet Take 50 mg by mouth at bedtime.    Marland Kitchen aspirin 81 MG tablet Take 81 mg by mouth daily.    . baclofen (LIORESAL) 20 MG tablet Take 20 mg by mouth 4 (four) times daily.    . calcium carbonate (OS-CAL) 600 MG TABS tablet Take 600 mg by mouth 2 (two) times daily with a meal.    . Cholecalciferol (VITAMIN D3) 1000 UNITS CAPS Take 2 capsules by mouth daily.     . clonazePAM (KLONOPIN) 0.5 MG tablet Take 1 tablet (0.5 mg total) by mouth 2 (two) times daily as needed for anxiety. 60 tablet 1  . dexlansoprazole (DEXILANT) 60 MG capsule Take 1 capsule (60 mg total) by mouth daily. 30 capsule 0  . fluticasone (FLONASE) 50 MCG/ACT nasal spray Place 2 sprays into both nostrils daily.    Marland Kitchen lactobacillus  acidophilus (BACID) TABS tablet Take 2 tablets by mouth daily.    . modafinil (PROVIGIL) 200 MG tablet Take 200 mg by mouth daily.  0  . nystatin cream (MYCOSTATIN) apply to affected area twice a day for 7 days then if needed for yeast infection  0  . polyethylene glycol (MIRALAX / GLYCOLAX) packet Take 17 g by mouth daily.    . sertraline (ZOLOFT) 100 MG tablet Take 1 tablet (100 mg total) by mouth every morning. 30 tablet 1  . zolpidem (AMBIEN) 5  MG tablet Take 1 tablet (5 mg total) by mouth at bedtime as needed for sleep. 30 tablet 1   No current facility-administered medications for this visit.    Medical Decision Making:  Established Problem, Stable/Improving (1), Review of Medication Regimen & Side Effects (2) and Review of New Medication or Change in Dosage (2)  Treatment Plan Summary:Medication management and Plan  Major depressive disorder, recurrent, moderate- patient reports some benefit at sertraline 75 mg daily. We decided we will increase to 100 mg daily. Patient is aware she can take 2 of her 50 mg tablets. We'll continue her clonazepam 0.5 millions twice daily as needed. Patient states she's typically only been using it once a day and has not used it twice a day yet.  Insomnia-Ambien 5 mg at bedtime as needed for insomnia. Risk and benefits of been discussing patient's able to consent.   Anxiety-Klonopin 0.5 mg twice daily as needed Wallace Going 09/06/2015, 11:16 AM

## 2015-09-18 ENCOUNTER — Inpatient Hospital Stay: Payer: Commercial Managed Care - PPO | Attending: Family Medicine

## 2015-09-18 VITALS — BP 145/81 | HR 75 | Temp 98.2°F | Resp 18

## 2015-09-18 DIAGNOSIS — G35 Multiple sclerosis: Secondary | ICD-10-CM | POA: Insufficient documentation

## 2015-09-18 DIAGNOSIS — Z79899 Other long term (current) drug therapy: Secondary | ICD-10-CM | POA: Diagnosis not present

## 2015-09-18 MED ORDER — NATALIZUMAB 300 MG/15ML IV CONC
300.0000 mg | Freq: Once | INTRAVENOUS | Status: AC
  Administered 2015-09-18: 300 mg via INTRAVENOUS
  Filled 2015-09-18: qty 15

## 2015-09-18 MED ORDER — SODIUM CHLORIDE 0.9 % IV SOLN
Freq: Once | INTRAVENOUS | Status: AC
  Administered 2015-09-18: 11:00:00 via INTRAVENOUS
  Filled 2015-09-18: qty 1000

## 2015-09-18 MED ORDER — ACETAMINOPHEN 500 MG PO TABS
1000.0000 mg | ORAL_TABLET | Freq: Once | ORAL | Status: AC
  Administered 2015-09-18: 1000 mg via ORAL
  Filled 2015-09-18: qty 2

## 2015-09-20 ENCOUNTER — Ambulatory Visit (INDEPENDENT_AMBULATORY_CARE_PROVIDER_SITE_OTHER): Payer: 59 | Admitting: Licensed Clinical Social Worker

## 2015-09-20 DIAGNOSIS — F331 Major depressive disorder, recurrent, moderate: Secondary | ICD-10-CM | POA: Diagnosis not present

## 2015-09-20 DIAGNOSIS — F411 Generalized anxiety disorder: Secondary | ICD-10-CM

## 2015-09-20 NOTE — Progress Notes (Signed)
THERAPIST PROGRESS NOTE  Session Time: 9:05 a.m. - 10:10 a.m.  Participation Level: Active  Behavioral Response: CasualAlertAnxious, Depressed and Euthymic  Type of Therapy: Individual Therapy  Treatment Goals addressed: Anxiety, Communication: Assertiveness & Boundary setting and Coping  Interventions: Strength-based and Supportive  Summary: Rose Clements is a 54 y.o. female who presents with improvement in depressive symptoms yet does report episodic feelings of guilt, frustration, feeling down yet connects much of this to recent physical problems related to her MS.  She feels anxious most days yet describes self as being more aware of triggers for this and with additional strategies that help to alleviate this, including self talk, prayer, music, aromatherapy and talking to family and friends who are positive and encouraging.  [name] talked about ongoing irritability and stress around friend's extended stay since the trip back home was cancelled due to [facility].  Rose Clements has been assertive with her friend yet has moments where she assesses herself to be almost mean and rude and keeps a check on any un-necessary guilt and shame.  Muscle spasms and leg cramps in her legs due to MS.  She is hoping to avoid having to start on Prednisone.  She received a third infusion called Tysabri for MS but there is trouble getting IVs started because of complications with client's veins.  Overall reports "It's not bad."  Rose Clements is able to laugh and joke with the nursing staff and continues to be pleased with all of the medical and psychiatric care that she is receiving.  She has a follow up with the Neurologist to assess the effectiveness of the medication.  "I can't really tell a difference." The medical staff go back and forth with what type of MS she has and this presents more stress for her given the unknowns of her illness.  Rose Clements is looking forward to being with the family over the [place]. Leaving  November 17, 2015, via [employer] to spend several weeks with her daughter and son-in-law.  The son-in-law is in better spirits when [place] talked to him today.    Her faith is a source of strength for her and her Elesa Clements family continues to keep in touch with her and offers to drive her places and come to visit and help her out at home.  "I have my way of doing things."  Rose Clements believes that part of her frustration with the friend is when her friend tries to compare her own physical aches/pains and problems to what Rose Clements has and rational or not, client's experience with her friend due to the two of them having very different ways of doing things around the home.  Overall she feels that she is moving towards personal goals in therapy when it comes to being more comfortable asserting herself and speaking up.  One area that she admits continues to bother her is that as a result of her MS, she often has a difficult time putting her words/thoughts together and can become easily distracted or go away from the topic.  She has come to accept this as part of her illness and seems to be less critical of herself in comparison to how she was in the remote past.  Rose Clements expressed appropriate disappointment about LCSW leaving this practice and would like to return for a follow up visit prior to LCSW's last day.  She accepted the letter outlining other providers in the area including Nolon Rod, LCSW in this practice.  She voiced appreciation to LCSW for help  provided over the past several months and indicated "You have helped me so much to see things in different ways and to be more assertive."  Suicidal/Homicidal: Negativewithout intent/plan  Therapist Response:   Ongoing emotional and social support was provided and emphasized client's strengths and encouraged client to journal/note specific social situations/encounters where she feels uncomfortable and further recommended that she also write down thoughts & feelings attached to  these experiences.   Commended her on recognizing the importance of setting appropriate boundaries to care for herself emotionally and physically.  Normalized her experiences and validated ongoing expression of feelings, struggles and needs.  Encouraged calls to clinic between appointments PRN.  LCSW discussed with client that 10/11/15 will be LCSW last day in this clinic and provided her with a letter that outlined this as well as her options for additional outpatient therapist.  Welcomed client to return to see LCSW at least one more time prior to 10/11/15, and reassured that either LCSW or [facility] staff could assist client with locating another therapist.  Reassured Rose Clements that this will not interrupt her services with Dr. Mayford Knife in the practice.   Thanked client for trusting LCSW and for being such an active participant in her treatment.    Plan: Return again in two weeks.  Client will take medications as prescribed.  She will notify this clinic and providers of new concerns and/or psychosocial needs along with her decision about future outpatient therapy referrals.  Diagnosis: Major Depressive Disorder, Recurrent, Moderate   Anxiety [address]  Rose Clements, [place] 09/20/2015

## 2015-09-24 ENCOUNTER — Ambulatory Visit: Payer: Self-pay | Admitting: Psychiatry

## 2015-10-04 ENCOUNTER — Ambulatory Visit (INDEPENDENT_AMBULATORY_CARE_PROVIDER_SITE_OTHER): Payer: 59 | Admitting: Licensed Clinical Social Worker

## 2015-10-04 DIAGNOSIS — F411 Generalized anxiety disorder: Secondary | ICD-10-CM

## 2015-10-04 DIAGNOSIS — F331 Major depressive disorder, recurrent, moderate: Secondary | ICD-10-CM

## 2015-10-04 NOTE — Progress Notes (Signed)
THERAPIST PROGRESS NOTE  Session Time: 11:20 a.m. - 12:30 p.m.  Participation Level: Active  Behavioral Response: CasualAlertAnxious and Depressed  Type of Therapy: Individual Therapy  Treatment Goals addressed: Anxiety and Coping  Interventions: CBT, Solution Focused, Strength-based, Supportive and Reframing  Summary: Rose Clements is a 54 y.o. female who presents with recent set back in terms of her depressive symptoms and anxiety given recent MS flare up and overall feeling sick physically.  Stress of having her friend from [place] in the home for the past six weeks has also exacerbated client's anxiety and irritability.  The friend left earlier in the week and Rose Clements is home alone again during the week until spouse comes home on the week-ends. She shared with LCSW a video clip of herself this morning awake at 4:00 a.m. And the different areas of her home where she has not completed certain tasks.  "This is not me. I'm just feeling special."  She is aware of her tendencies and beliefs around perfectionism and how things "should" or "should not" be and agreed that these likely cause her to feel more over-whelmed and depressed.  Ongoing struggles and adjustment to living with MS and being alone most of the time was topic of session.  Rose Clements's faith is a source of strength for her and she continues to connect and receive support from her [facility] group.  Overall client appears to be doing the best that she can in the circumstances and informed LCSW that she appreciated hearing that reassurance and encouragement.  She was receptive to suggestions and support offered around being less judgmental of herself, less critical self talk and to LCSW's ideas around tips for avoiding procrastination.  Rose Clements expressed appropriate disappointment about LCSW leaving this practice and agreed to follow up with Nolon Rod, LCSW in this practice.  She voiced appreciation to LCSW for help provided over the past several months  and indicated "You have helped me so much to see things in different ways and to be more assertive."  She would like to resume OPT services with LCSW once insurance credentialing is established with her insurance company.  In the interim, Rose Clements will follow up with Nolon Rod, LCSW in this practice.  No immediate risks of harm to self or others, Rose Clements maintains personal hygiene and able to care for herself independently at this time.  She denied additional concerns or psychosocial needs at time of session.  Suicidal/Homicidal: Negativewithout intent/plan  Therapist Response:  Assessed client's functional status and for immediate risk factors. Ongoing emotional and social support was provided and emphasized client's strengths and encouraged practicing less self-criticism and discussed idea of living judgment free.  Psycho-education on underlying causes for perfectionism and strategies for dealing with these.  Commended her on recognizing the importance of setting appropriate boundaries to care for herself emotionally and physically with others in her life.  Normalized her experiences and validated ongoing expression of feelings, struggles and needs in relation to living with a chronic & debilitating illness.  Encouraged to set up OPT appointment with other LCSW in [facility].  Thanked client for trusting LCSW and for being such an active participant in her treatment.  Reassured client that LCSW will follow up with her in coming weeks about insurance credentialing with her insurance provider.  Plan: Terminate patient-therapist relationship and client will follow up with other provider, Nolon Rod in this clinic.  Client will take medications as prescribed and keep scheduled appointments.    Diagnosis: Major Depressive Disorder, Recurrent,  Moderate   Anxiety State   Rose Clements, [place] 10/04/2015

## 2015-10-15 ENCOUNTER — Encounter: Payer: Self-pay | Admitting: Psychiatry

## 2015-10-15 ENCOUNTER — Ambulatory Visit (INDEPENDENT_AMBULATORY_CARE_PROVIDER_SITE_OTHER): Payer: 59 | Admitting: Psychiatry

## 2015-10-15 VITALS — BP 128/84 | HR 85 | Temp 97.5°F | Ht 65.0 in | Wt 202.6 lb

## 2015-10-15 DIAGNOSIS — F411 Generalized anxiety disorder: Secondary | ICD-10-CM

## 2015-10-15 DIAGNOSIS — F331 Major depressive disorder, recurrent, moderate: Secondary | ICD-10-CM | POA: Diagnosis not present

## 2015-10-15 MED ORDER — CLONAZEPAM 0.5 MG PO TABS: 0.5000 mg | ORAL_TABLET | Freq: Two times a day (BID) | ORAL | Status: DC | PRN

## 2015-10-15 MED ORDER — SERTRALINE HCL 100 MG PO TABS: 100.0000 mg | ORAL_TABLET | ORAL | Status: DC

## 2015-10-15 NOTE — Progress Notes (Signed)
BH MD/PA/NP OP Progress Note  10/15/2015 10:10 AM Rose Clements  MRN:  [id number]  Subjective:  Patient returns for follow-up of her major depressive disorder, moderate, recurrent. Patient indicates that her mood has been stable. She states she can't really noticed a significant difference from the increase in her sertraline from 75 mg 200 mg. However she is able to laugh and display a bright mood in this appointment today. She discusses an upcoming trip for the holidays to go see her daughter and son-in-law in [place]. She states she looks forward to that trip.  She states her biggest issue continues to be attention and follow through. She is seeing her neurologist yesterday and discussed this issue and she was told that this is part of her MS. I told her that one option might be a stimulant for these things however given the number of medications I did not feel that adding additional medication to her current medication regimen would be ideal this time. He could continue to explore this in the future and in particular if she is able to decrease the number of medication she is on. Chief Complaint:  memory Chief Complaint    Follow-up; Medication Refill     Visit Diagnosis:     ICD-9-CM ICD-10-CM   1. Anxiety state 300.00 F41.1   2. Major depressive disorder, recurrent episode, moderate (HCC) 296.32 F33.1     Past Medical History:  Past Medical History  Diagnosis Date  . Multilevel degenerative disc disease   . Depression   . GERD (gastroesophageal reflux disease)   . Hypertension   . Esophageal dysphagia   . Heartburn   . Dysphagia     Dx by Beverely Risen on 10/15/14  . Multiple sclerosis (HCC)   . Cystitis bacillary, chronic   . Eosinophilic esophagitis 11/19/2014    GE junction benign stricture s/p balloon dilation, retained food contents. ?gastroparesis versus secondary to EE.  . Constipation   . Multiple sclerosis (HCC) 07/16/2015    Past Surgical History  Procedure Laterality Date   . Tonsillectomy  1973  . Cesarean section  1989  . Carpal tunnel release Right 1997  . Cystectomy  2002    From chest  . Ovarian cyst removal Left 2002    Ovarian mass removal  . Abdominal hysterectomy  2003  . Upper gastrointestinal endoscopy  11/19/2014    Benign appearing esophageal stricture-Dilated.  . Kidney donation Left 2002   Family History:  Family History  Problem Relation Age of Onset  . Diabetes Mother   . Hypertension Mother   . Heart disease Mother   . Pulmonary embolism Mother   . Diabetes Father   . Diabetes Sister   . Hypertension Sister   . Heart disease Sister   . Bipolar disorder Sister   . Heart attack Maternal Grandmother   . Pancreatic cancer Maternal Grandfather   . Narcolepsy Brother    Social History:  Social History   Social History  . Marital Status: Married    Spouse Name: N/A  . Number of Children: N/A  . Years of Education: N/A   Social History Main Topics  . Smoking status: Never Smoker   . Smokeless tobacco: Never Used  . Alcohol Use: No  . Drug Use: No  . Sexual Activity: Not Currently   Other Topics Concern  . None   Social History Narrative   Additional History:   Assessment:   Musculoskeletal: Strength & Muscle Tone: Patient ambulates slowly with a cane.  Gait & Station: Slow and with cane Patient leans: N/A  Psychiatric Specialty Exam: Anxiety Symptoms include insomnia and nervous/anxious behavior. Patient reports no suicidal ideas.    Depression        Associated symptoms include insomnia.  Associated symptoms include no suicidal ideas.  Past medical history includes anxiety.     Review of Systems  Psychiatric/Behavioral: Positive for depression (reports slight improvement). Negative for suicidal ideas, hallucinations, memory loss and substance abuse. The patient is nervous/anxious and has insomnia.     Blood pressure 128/84, pulse 85, temperature 97.5 F (36.4 C), temperature source Tympanic, height   (1.651 m), weight 202 lb 9.6 oz (91.899 kg), SpO2 99 %.Body mass index is 33.71 kg/(m^2).  General Appearance: Neat and Well Groomed  Eye Contact:  Good  Speech:  Clear and Coherent  Volume:  Normal  Mood:  Good  Affect:  Fairly bright, able to smile  Thought Process:  Linear and Logical  Orientation:  Full (Time, Place, and Person)  Thought Content:  Negative  Suicidal Thoughts:  No  Homicidal Thoughts:  No  Memory:  Immediate;   Good Recent;   Good Remote;   Good  Judgement:  Good  Insight:  Good  Psychomotor Activity:  Negative  Concentration:  Good  Recall:  Good  Fund of Knowledge: Good  Language: Good  Akathisia:  Negative  Handed:  Right unknown   AIMS (if indicated):  N/A  Assets:  Communication Skills Desire for Improvement Social Support  ADL's:  Intact  Cognition: WNL  Sleep:  Fair with Vistaril    Is the patient at risk to self?  No. Has the patient been a risk to self in the past 6 months?  No. Has the patient been a risk to self within the distant past?  No. Is the patient a risk to others?  No. Has the patient been a risk to others in the past 6 months?  No. Has the patient been a risk to others within the distant past?  No.  Current Medications: Current Outpatient Prescriptions  Medication Sig Dispense Refill  . amitriptyline (ELAVIL) 50 MG tablet Take 50 mg by mouth at bedtime.    Marland Kitchen aspirin 81 MG tablet Take 81 mg by mouth daily.    . baclofen (LIORESAL) 20 MG tablet Take 20 mg by mouth 4 (four) times daily.    . calcium carbonate (OS-CAL) 600 MG TABS tablet Take 600 mg by mouth 2 (two) times daily with a meal.    . cetirizine (ZYRTEC) 10 MG tablet Take 10 mg by mouth daily.    . Cholecalciferol (VITAMIN D3) 1000 UNITS CAPS Take 2 capsules by mouth daily.     . clonazePAM (KLONOPIN) 0.5 MG tablet Take 1 tablet (0.5 mg total) by mouth 2 (two) times daily as needed for anxiety. 60 tablet 2  . dexlansoprazole (DEXILANT) 60 MG capsule Take 1 capsule (60  mg total) by mouth daily. 30 capsule 0  . fluticasone (FLONASE) 50 MCG/ACT nasal spray Place 2 sprays into both nostrils daily.    Marland Kitchen gabapentin (NEURONTIN) 300 MG capsule Take by mouth.    . hydrOXYzine (VISTARIL) 25 MG capsule   0  . lactobacillus acidophilus (BACID) TABS tablet Take 2 tablets by mouth daily.    . mirabegron ER (MYRBETRIQ) 25 MG TB24 tablet Take 25 mg by mouth.    . modafinil (PROVIGIL) 200 MG tablet Take 200 mg by mouth daily.  0  . natalizumab (TYSABRI) 300  MG/15ML injection Inject into the vein.    Marland Kitchen nystatin cream (MYCOSTATIN) apply to affected area twice a day for 7 days then if needed for yeast infection  0  . polyethylene glycol (MIRALAX / GLYCOLAX) packet Take 17 g by mouth daily.    . polyethylene glycol powder (GLYCOLAX/MIRALAX) powder take 17GM (DISSOLVED IN WATER) by mouth twice a day  0  . sertraline (ZOLOFT) 100 MG tablet Take 1 tablet (100 mg total) by mouth every morning. 30 tablet 2  . zolpidem (AMBIEN) 5 MG tablet Take 1 tablet (5 mg total) by mouth at bedtime as needed for sleep. 30 tablet 1   No current facility-administered medications for this visit.    Medical Decision Making:  Established Problem, Stable/Improving (1), Review of Medication Regimen & Side Effects (2) and Review of New Medication or Change in Dosage (2)  Treatment Plan Summary:Medication management and Plan  Major depressive disorder, recurrent, moderate- patient's mood is stable. We will continue sertraline 100 mg daily.    Insomnia-Ambien 5 mg at bedtime as needed for insomnia. Risk and benefits of been discussing patient's able to consent.   Anxiety-Klonopin 0.5 mg twice daily as neededPatient states she's typically only been using it once a day and has not used it twice a day yet. Wallace Going 10/15/2015, 10:10 AM

## 2015-10-16 ENCOUNTER — Inpatient Hospital Stay: Payer: 59 | Attending: Family Medicine

## 2015-10-16 VITALS — BP 133/73 | HR 78 | Temp 97.0°F | Resp 18

## 2015-10-16 DIAGNOSIS — Z79899 Other long term (current) drug therapy: Secondary | ICD-10-CM | POA: Insufficient documentation

## 2015-10-16 DIAGNOSIS — G35 Multiple sclerosis: Secondary | ICD-10-CM | POA: Diagnosis not present

## 2015-10-16 MED ORDER — ACETAMINOPHEN 500 MG PO TABS
1000.0000 mg | ORAL_TABLET | Freq: Once | ORAL | Status: AC
  Administered 2015-10-16: 1000 mg via ORAL
  Filled 2015-10-16: qty 2

## 2015-10-16 MED ORDER — SODIUM CHLORIDE 0.9 % IV SOLN
Freq: Once | INTRAVENOUS | Status: AC
  Administered 2015-10-16: 10:00:00 via INTRAVENOUS
  Filled 2015-10-16: qty 1000

## 2015-10-16 MED ORDER — NATALIZUMAB 300 MG/15ML IV CONC
300.0000 mg | Freq: Once | INTRAVENOUS | Status: AC
  Administered 2015-10-16: 300 mg via INTRAVENOUS
  Filled 2015-10-16: qty 15

## 2015-10-16 NOTE — Progress Notes (Signed)
Patient would not stay 1 hour post infusion.  IV removed and patient d/c to home.

## 2015-10-17 DIAGNOSIS — N3941 Urge incontinence: Secondary | ICD-10-CM | POA: Insufficient documentation

## 2015-10-17 DIAGNOSIS — R3915 Urgency of urination: Secondary | ICD-10-CM | POA: Insufficient documentation

## 2015-11-13 ENCOUNTER — Inpatient Hospital Stay: Payer: Commercial Managed Care - PPO | Attending: Family Medicine

## 2015-11-13 VITALS — BP 108/59 | HR 77 | Temp 98.7°F | Resp 18

## 2015-11-13 DIAGNOSIS — Z79899 Other long term (current) drug therapy: Secondary | ICD-10-CM | POA: Diagnosis not present

## 2015-11-13 DIAGNOSIS — G35 Multiple sclerosis: Secondary | ICD-10-CM

## 2015-11-13 MED ORDER — SODIUM CHLORIDE 0.9 % IV SOLN
300.0000 mg | Freq: Once | INTRAVENOUS | Status: AC
  Administered 2015-11-13: 300 mg via INTRAVENOUS
  Filled 2015-11-13: qty 15

## 2015-11-13 MED ORDER — SODIUM CHLORIDE 0.9 % IV SOLN
Freq: Once | INTRAVENOUS | Status: AC
  Administered 2015-11-13: 11:00:00 via INTRAVENOUS
  Filled 2015-11-13: qty 1000

## 2015-11-13 MED ORDER — ACETAMINOPHEN 500 MG PO TABS
1000.0000 mg | ORAL_TABLET | Freq: Once | ORAL | Status: AC
  Administered 2015-11-13: 1000 mg via ORAL
  Filled 2015-11-13: qty 2

## 2015-12-11 ENCOUNTER — Inpatient Hospital Stay: Payer: Commercial Managed Care - PPO | Attending: Family Medicine

## 2015-12-11 VITALS — BP 131/81 | HR 72 | Temp 97.2°F

## 2015-12-11 DIAGNOSIS — Z79899 Other long term (current) drug therapy: Secondary | ICD-10-CM | POA: Insufficient documentation

## 2015-12-11 DIAGNOSIS — G35 Multiple sclerosis: Secondary | ICD-10-CM | POA: Diagnosis not present

## 2015-12-11 MED ORDER — SODIUM CHLORIDE 0.9 % IV SOLN
Freq: Once | INTRAVENOUS | Status: AC
  Administered 2015-12-11: 11:00:00 via INTRAVENOUS
  Filled 2015-12-11: qty 1000

## 2015-12-11 MED ORDER — SODIUM CHLORIDE 0.9 % IV SOLN
300.0000 mg | Freq: Once | INTRAVENOUS | Status: AC
  Administered 2015-12-11: 300 mg via INTRAVENOUS
  Filled 2015-12-11: qty 15

## 2015-12-11 MED ORDER — ACETAMINOPHEN 500 MG PO TABS
1000.0000 mg | ORAL_TABLET | Freq: Once | ORAL | Status: AC
  Administered 2015-12-11: 1000 mg via ORAL
  Filled 2015-12-11: qty 2

## 2015-12-13 ENCOUNTER — Ambulatory Visit (INDEPENDENT_AMBULATORY_CARE_PROVIDER_SITE_OTHER): Payer: 59 | Admitting: Psychiatry

## 2015-12-13 ENCOUNTER — Encounter: Payer: Self-pay | Admitting: Psychiatry

## 2015-12-13 VITALS — BP 128/78 | HR 96 | Temp 98.2°F | Ht 65.0 in | Wt 202.4 lb

## 2015-12-13 DIAGNOSIS — F411 Generalized anxiety disorder: Secondary | ICD-10-CM | POA: Diagnosis not present

## 2015-12-13 DIAGNOSIS — F331 Major depressive disorder, recurrent, moderate: Secondary | ICD-10-CM

## 2015-12-13 MED ORDER — ZOLPIDEM TARTRATE 5 MG PO TABS: 5.0000 mg | ORAL_TABLET | Freq: Every evening | ORAL | Status: DC | PRN

## 2015-12-13 MED ORDER — SERTRALINE HCL 100 MG PO TABS: 100.0000 mg | ORAL_TABLET | ORAL | Status: DC

## 2015-12-13 MED ORDER — CLONAZEPAM 0.5 MG PO TABS: 0.5000 mg | ORAL_TABLET | Freq: Two times a day (BID) | ORAL | Status: DC | PRN

## 2015-12-13 NOTE — Progress Notes (Signed)
BH MD/PA/NP OP Progress Note  12/13/2015 10:35 AM Rose Clements  MRN:  [id number]  Subjective:  Patient returns for follow-up of her major depressive disorder, moderate, recurrent. Patient does states she feels like the sertraline has been good for her mood. She states that she did go visit her daughter in [place] over the holidays and the visit was good. She states the travel was somewhat stressful for her because she had her walker and had to get on planes and deal with transfers. However she states that the staff in the airport were very friendly to her and this helped. She states she plans to continue seeing the therapist that has left this clinic and gone on to her own practice. She states the clonazepam she typically uses once a day. She states uses the Ambien very infrequently. She discussed that this past week she moved a tray from her living room and put it on her walker but when she woke up in the morning she did not remember doing that. I discussed whether this was a side effect of Ambien and she states she's been on Ambien now since September and has not had any sleep behaviors related to Ambien and also believes she didn't take Ambien the night that the issue with the tray occurred. Chief Complaint:  good Chief Complaint    Follow-up; Medication Refill; Other     Visit Diagnosis:     ICD-9-CM ICD-10-CM   1. Anxiety state 300.00 F41.1   2. Major depressive disorder, recurrent episode, moderate (HCC) 296.32 F33.1     Past Medical History:  Past Medical History  Diagnosis Date  . Multilevel degenerative disc disease   . Depression   . GERD (gastroesophageal reflux disease)   . Hypertension   . Esophageal dysphagia   . Heartburn   . Dysphagia     Dx by Beverely Risen on 10/15/14  . Multiple sclerosis (HCC)   . Cystitis bacillary, chronic   . Eosinophilic esophagitis 11/19/2014    GE junction benign stricture s/p balloon dilation, retained food contents. ?gastroparesis versus secondary  to EE.  . Constipation   . Multiple sclerosis (HCC) 07/16/2015    Past Surgical History  Procedure Laterality Date  . Tonsillectomy  1973  . Cesarean section  1989  . Carpal tunnel release Right 1997  . Cystectomy  2002    From chest  . Ovarian cyst removal Left 2002    Ovarian mass removal  . Abdominal hysterectomy  2003  . Upper gastrointestinal endoscopy  11/19/2014    Benign appearing esophageal stricture-Dilated.  . Kidney donation Left 2002   Family History:  Family History  Problem Relation Age of Onset  . Diabetes Mother   . Hypertension Mother   . Heart disease Mother   . Pulmonary embolism Mother   . Diabetes Father   . Diabetes Sister   . Hypertension Sister   . Heart disease Sister   . Bipolar disorder Sister   . Heart attack Maternal Grandmother   . Pancreatic cancer Maternal Grandfather   . Narcolepsy Brother    Social History:  Social History   Social History  . Marital Status: Married    Spouse Name: N/A  . Number of Children: N/A  . Years of Education: N/A   Social History Main Topics  . Smoking status: Never Smoker   . Smokeless tobacco: Never Used  . Alcohol Use: No  . Drug Use: No  . Sexual Activity: Not Currently   Other Topics  Concern  . None   Social History Narrative   Additional History:   Assessment:   Musculoskeletal: Strength & Muscle Tone: Patient ambulates slowly with a cane. Gait & Station: Slow and with cane Patient leans: N/A  Psychiatric Specialty Exam: Anxiety Patient reports no insomnia, nervous/anxious behavior or suicidal ideas.    Depression        Associated symptoms include does not have insomnia and no suicidal ideas.  Past medical history includes anxiety.     Review of Systems  Psychiatric/Behavioral: Negative for depression, suicidal ideas, hallucinations, memory loss and substance abuse. The patient is not nervous/anxious and does not have insomnia.   All other systems reviewed and are negative.    Blood pressure 128/78, pulse 96, temperature 98.2 F (36.8 C), temperature source Tympanic, height 5\' 5"  (1.651 m), weight 202 lb 6.4 oz (91.808 kg), SpO2 97 %.Body mass index is 33.68 kg/(m^2).  General Appearance: Neat and Well Groomed  Eye Contact:  Good  Speech:  Clear and Coherent  Volume:  Normal  Mood:  Good  Affect:  Congruent  Thought Process:  Linear and Logical  Orientation:  Full (Time, Place, and Person)  Thought Content:  Negative  Suicidal Thoughts:  No  Homicidal Thoughts:  No  Memory:  Immediate;   Good Recent;   Good Remote;   Good  Judgement:  Good  Insight:  Good  Psychomotor Activity:  Negative  Concentration:  Good  Recall:  Good  Fund of Knowledge: Good  Language: Good  Akathisia:  Negative  Handed:  Right unknown   AIMS (if indicated):  N/A  Assets:  Communication Skills Desire for Improvement Social Support  ADL's:  Intact  Cognition: WNL  Sleep:  Good periodically uses the Ambien    Is the patient at risk to self?  No. Has the patient been a risk to self in the past 6 months?  No. Has the patient been a risk to self within the distant past?  No. Is the patient a risk to others?  No. Has the patient been a risk to others in the past 6 months?  No. Has the patient been a risk to others within the distant past?  No.  Current Medications: Current Outpatient Prescriptions  Medication Sig Dispense Refill  . amitriptyline (ELAVIL) 50 MG tablet Take 50 mg by mouth at bedtime.    Marland Kitchen aspirin 81 MG tablet Take 81 mg by mouth daily.    . baclofen (LIORESAL) 20 MG tablet Take 20 mg by mouth 4 (four) times daily.    . calcium carbonate (OS-CAL) 600 MG TABS tablet Take 600 mg by mouth 2 (two) times daily with a meal.    . cetirizine (ZYRTEC) 10 MG tablet Take 10 mg by mouth daily.    . Cholecalciferol (VITAMIN D3) 1000 UNITS CAPS Take 2 capsules by mouth daily.     . clonazePAM (KLONOPIN) 0.5 MG tablet Take 1 tablet (0.5 mg total) by mouth 2 (two) times  daily as needed for anxiety. 60 tablet 4  . dexlansoprazole (DEXILANT) 60 MG capsule Take 1 capsule (60 mg total) by mouth daily. 30 capsule 0  . fluticasone (FLONASE) 50 MCG/ACT nasal spray Place 2 sprays into both nostrils daily.    Marland Kitchen gabapentin (NEURONTIN) 300 MG capsule Take by mouth.    . lactobacillus acidophilus (BACID) TABS tablet Take 2 tablets by mouth daily.    . modafinil (PROVIGIL) 200 MG tablet Take 200 mg by mouth daily.  0  .  natalizumab (TYSABRI) 300 MG/15ML injection Inject into the vein.    Marland Kitchen nystatin cream (MYCOSTATIN) apply to affected area twice a day for 7 days then if needed for yeast infection  0  . polyethylene glycol powder (GLYCOLAX/MIRALAX) powder take 17GM (DISSOLVED IN WATER) by mouth twice a day  0  . sertraline (ZOLOFT) 100 MG tablet Take 1 tablet (100 mg total) by mouth every morning. 30 tablet 4  . zolpidem (AMBIEN) 5 MG tablet Take 1 tablet (5 mg total) by mouth at bedtime as needed for sleep. 30 tablet 4   No current facility-administered medications for this visit.    Medical Decision Making:  Established Problem, Stable/Improving (1), Review of Medication Regimen & Side Effects (2) and Review of New Medication or Change in Dosage (2)  Treatment Plan Summary:Medication management and Plan  Major depressive disorder, recurrent, moderate- patient's mood is stable. We will continue sertraline 100 mg daily.    Insomnia-Ambien 5 mg at bedtime as needed for insomnia. Risk and benefits of been discussing patient's able to consent.   Anxiety-Klonopin 0.5 mg twice daily as needed. Patient states she's typically only been using it once a day and has not used it twice a day yet.  Patient has been fairly stable on this regimen. She'll follow up in 3 months. She's aware I will be departing this clinic next month but that he will be able to continue with another provider within this clinic. She's been encouraged call any questions or concerns prior to her next  appointment. Wallace Going 12/13/2015, 10:35 AM

## 2016-01-13 ENCOUNTER — Inpatient Hospital Stay: Payer: 59

## 2016-01-13 ENCOUNTER — Inpatient Hospital Stay: Payer: 59 | Attending: Family Medicine | Admitting: Family Medicine

## 2016-01-13 VITALS — BP 132/87 | HR 86 | Temp 97.9°F | Resp 18 | Wt 211.4 lb

## 2016-01-13 DIAGNOSIS — K219 Gastro-esophageal reflux disease without esophagitis: Secondary | ICD-10-CM | POA: Diagnosis not present

## 2016-01-13 DIAGNOSIS — Z79899 Other long term (current) drug therapy: Secondary | ICD-10-CM | POA: Diagnosis not present

## 2016-01-13 DIAGNOSIS — F329 Major depressive disorder, single episode, unspecified: Secondary | ICD-10-CM | POA: Diagnosis not present

## 2016-01-13 DIAGNOSIS — F419 Anxiety disorder, unspecified: Secondary | ICD-10-CM | POA: Insufficient documentation

## 2016-01-13 DIAGNOSIS — I10 Essential (primary) hypertension: Secondary | ICD-10-CM | POA: Insufficient documentation

## 2016-01-13 DIAGNOSIS — G35 Multiple sclerosis: Secondary | ICD-10-CM | POA: Diagnosis not present

## 2016-01-13 DIAGNOSIS — Z7982 Long term (current) use of aspirin: Secondary | ICD-10-CM | POA: Diagnosis not present

## 2016-01-13 MED ORDER — SODIUM CHLORIDE 0.9 % IV SOLN
300.0000 mg | Freq: Once | INTRAVENOUS | Status: AC
  Administered 2016-01-13: 300 mg via INTRAVENOUS
  Filled 2016-01-13: qty 15

## 2016-01-13 MED ORDER — ACETAMINOPHEN 500 MG PO TABS
1000.0000 mg | ORAL_TABLET | Freq: Once | ORAL | Status: AC
  Administered 2016-01-13: 1000 mg via ORAL

## 2016-01-13 MED ORDER — SODIUM CHLORIDE 0.9 % IV SOLN
Freq: Once | INTRAVENOUS | Status: DC
  Administered 2016-01-13: 10:00:00 via INTRAVENOUS
  Filled 2016-01-13: qty 1000

## 2016-01-13 NOTE — Progress Notes (Signed)
Rose Clements  [phone]  [phone]     Rose Clements DOB: August 20, 1961  MR#: [id number]  QIO#:[id number]  Patient Care Team: Carlean Jews, NP as PCP - General (Family Medicine)  CHIEF COMPLAINT:  No chief complaint on file.  Patient with past medical history including multiple sclerosis, hypertension, depression, anxiety, arthritis, bulging lumbar intravertebral discs.  INTERVAL HISTORY:  Patient is here for further evaluation and continued outpatient non-oncology infusions of Tysabri for treatment of multiple sclerosis. She is currently under the primary care neurologist Dr. Dan Humphreys with [facility]. She last saw Dr. Dan Humphreys in December and she has scheduled follow up in March 2017. She reports having some numbness on the right side as well as chronic weakness. She is using a walker for ambulation.   REVIEW OF SYSTEMS:   Review of Systems  Constitutional: Negative for fever, chills, weight loss, malaise/fatigue and diaphoresis.  HENT: Negative for congestion, ear discharge, ear pain, hearing loss, nosebleeds, sore throat and tinnitus.   Eyes: Negative for blurred vision, double vision, photophobia, pain, discharge and redness.  Respiratory: Negative for cough, hemoptysis, sputum production, shortness of breath, wheezing and stridor.   Cardiovascular: Negative for chest pain, palpitations, orthopnea, claudication, leg swelling and PND.  Gastrointestinal: Negative for heartburn, nausea, vomiting, abdominal pain, diarrhea, constipation, blood in stool and melena.  Genitourinary: Negative.   Musculoskeletal: Negative.   Skin: Negative.   Neurological: Positive for tingling, focal weakness and weakness. Negative for dizziness, seizures and headaches.       MS  Endo/Heme/Allergies: Does not bruise/bleed easily.  Psychiatric/Behavioral: Negative for depression. The patient is not nervous/anxious and does not have insomnia.     As per HPI.  Otherwise, a complete review of systems is negatve.   PAST MEDICAL HISTORY: Past Medical History  Diagnosis Date  . Multilevel degenerative disc disease   . Depression   . GERD (gastroesophageal reflux disease)   . Hypertension   . Esophageal dysphagia   . Heartburn   . Dysphagia     Dx by Beverely Risen on 10/15/14  . Multiple sclerosis (HCC)   . Cystitis bacillary, chronic   . Eosinophilic esophagitis 11/19/2014    GE junction benign stricture s/p balloon dilation, retained food contents. ?gastroparesis versus secondary to EE.  . Constipation   . Multiple sclerosis (HCC) 07/16/2015    PAST SURGICAL HISTORY: Past Surgical History  Procedure Laterality Date  . Tonsillectomy  1973  . Cesarean section  1989  . Carpal tunnel release Right 1997  . Cystectomy  2002    From chest  . Ovarian cyst removal Left 2002    Ovarian mass removal  . Abdominal hysterectomy  2003  . Upper gastrointestinal endoscopy  11/19/2014    Benign appearing esophageal stricture-Dilated.  . Kidney donation Left 2002    FAMILY HISTORY Family History  Problem Relation Age of Onset  . Diabetes Mother   . Hypertension Mother   . Heart disease Mother   . Pulmonary embolism Mother   . Diabetes Father   . Diabetes Sister   . Hypertension Sister   . Heart disease Sister   . Bipolar disorder Sister   . Heart attack Maternal Grandmother   . Pancreatic cancer Maternal Grandfather   . Narcolepsy Brother     GYNECOLOGIC HISTORY:  No LMP recorded. Patient has had a hysterectomy.     ADVANCED DIRECTIVES:    HEALTH MAINTENANCE: Social History  Substance Use Topics  . Smoking status: Never Smoker   .  Smokeless tobacco: Never Used  . Alcohol Use: No    No Known Allergies  Current Outpatient Prescriptions  Medication Sig Dispense Refill  . baclofen (LIORESAL) 20 MG tablet Take 20 mg by mouth 4 (four) times daily.    . calcium carbonate (OS-CAL) 600 MG TABS tablet Take 600 mg by mouth 2 (two) times  daily with a meal.    . cetirizine (ZYRTEC) 10 MG tablet Take 10 mg by mouth daily.    . Cholecalciferol (VITAMIN D3) 1000 UNITS CAPS Take 2 capsules by mouth daily.     . clonazePAM (KLONOPIN) 0.5 MG tablet Take 1 tablet (0.5 mg total) by mouth 2 (two) times daily as needed for anxiety. 60 tablet 4  . dexlansoprazole (DEXILANT) 60 MG capsule Take 1 capsule (60 mg total) by mouth daily. 30 capsule 0  . fluticasone (FLONASE) 50 MCG/ACT nasal spray Place 2 sprays into both nostrils daily.    Marland Kitchen gabapentin (NEURONTIN) 300 MG capsule Take 300 mg by mouth 3 (three) times daily.     Marland Kitchen lactobacillus acidophilus (BACID) TABS tablet Take 2 tablets by mouth daily.    . modafinil (PROVIGIL) 200 MG tablet Take 200 mg by mouth daily.  0  . natalizumab (TYSABRI) 300 MG/15ML injection Inject into the vein.    Marland Kitchen nystatin cream (MYCOSTATIN) apply to affected area twice a day for 7 days then if needed for yeast infection  0  . polyethylene glycol powder (GLYCOLAX/MIRALAX) powder take 17GM (DISSOLVED IN WATER) by mouth twice a day  0  . sertraline (ZOLOFT) 100 MG tablet Take 1 tablet (100 mg total) by mouth every morning. 30 tablet 4  . zolpidem (AMBIEN) 5 MG tablet Take 1 tablet (5 mg total) by mouth at bedtime as needed for sleep. 30 tablet 4  . aspirin 81 MG tablet Take 81 mg by mouth daily.     No current facility-administered medications for this visit.   Facility-Administered Medications Ordered in Other Visits  Medication Dose Route Frequency Provider Last Rate Last Dose  . 0.9 %  sodium chloride infusion   Intravenous Once Loann Quill, NP      . acetaminophen (TYLENOL) tablet 1,000 mg  1,000 mg Oral Once Loann Quill, NP      . natalizumab (TYSABRI) 300 mg in sodium chloride 0.9 % 100 mL IVPB  300 mg Intravenous Once Loann Quill, NP        OBJECTIVE: BP 132/87 mmHg  Pulse 86  Temp(Src) 97.9 F (36.6 C) (Tympanic)  Resp 18  Wt 211 lb 6.7 oz (95.9 kg)   Body mass index is 35.18  kg/(m^2).    ECOG FS:2 - Symptomatic, <50% confined to bed  General: Well-developed, well-nourished, no acute distress. Eyes: Pink conjunctiva, anicteric sclera. HEENT: Normocephalic, moist mucous membranes, clear oropharnyx. Lungs: Clear to auscultation bilaterally. Heart: Regular rate and rhythm. No rubs, murmurs, or gallops. Abdomen: Soft, nontender, nondistended. No organomegaly noted, normoactive bowel sounds. Musculoskeletal: Bilateral lower extremity edema 1+, nonpitting, cyanosis, or clubbing. Neuro: Alert, answering all questions appropriately. Cranial nerves grossly intact. Multiple sclerosis, using walker for ambulatory aid. Skin: No rashes or petechiae noted. Psych: Normal affect.  LAB RESULTS:  No results found for any previous visit.  STUDIES: No results found.  ASSESSMENT:  Multiple sclerosis  PLAN:   1. MS. We'll proceed with Tysabri 300 mg infusions every 4 weeks. Patient has been advised that questions regarding treatment or her disease should be directed to neurologist. Her next follow  up appointment with Dr. Dan Humphreys is in March 2017. All appropriate paperwork has been received and copy of prescription from Dr. Dan Humphreys is on file.  We'll schedule infusions every 4 weeks with next provider follow-up in approximately 6 months.  Patient expressed understanding and was in agreement with this plan. She also understands that She can call clinic at any time with any questions, concerns, or complaints.   Dr. Orlie Dakin was available for consultation and review of plan of care for this patient.   Loann Quill, NP   01/13/2016 10:42 AM

## 2016-02-13 ENCOUNTER — Ambulatory Visit: Payer: Self-pay

## 2016-02-14 ENCOUNTER — Encounter: Payer: Self-pay | Admitting: Family Medicine

## 2016-02-14 ENCOUNTER — Ambulatory Visit: Payer: Self-pay

## 2016-02-18 ENCOUNTER — Ambulatory Visit: Payer: Self-pay

## 2016-02-20 ENCOUNTER — Inpatient Hospital Stay: Payer: Commercial Managed Care - PPO | Attending: Family Medicine

## 2016-02-20 ENCOUNTER — Ambulatory Visit: Payer: Self-pay

## 2016-02-20 VITALS — BP 135/87 | HR 90 | Temp 99.0°F | Resp 19

## 2016-02-20 DIAGNOSIS — Z79899 Other long term (current) drug therapy: Secondary | ICD-10-CM | POA: Insufficient documentation

## 2016-02-20 DIAGNOSIS — G35 Multiple sclerosis: Secondary | ICD-10-CM

## 2016-02-20 MED ORDER — SODIUM CHLORIDE 0.9 % IV SOLN
Freq: Once | INTRAVENOUS | Status: AC
  Administered 2016-02-20: 10:00:00 via INTRAVENOUS
  Filled 2016-02-20: qty 1000

## 2016-02-20 MED ORDER — ACETAMINOPHEN 500 MG PO TABS
1000.0000 mg | ORAL_TABLET | Freq: Once | ORAL | Status: AC
  Administered 2016-02-20: 1000 mg via ORAL
  Filled 2016-02-20: qty 2

## 2016-02-20 MED ORDER — NATALIZUMAB 300 MG/15ML IV CONC
300.0000 mg | Freq: Once | INTRAVENOUS | Status: AC
  Administered 2016-02-20: 300 mg via INTRAVENOUS
  Filled 2016-02-20: qty 15

## 2016-03-12 ENCOUNTER — Ambulatory Visit (INDEPENDENT_AMBULATORY_CARE_PROVIDER_SITE_OTHER): Payer: 59 | Admitting: Psychiatry

## 2016-03-12 ENCOUNTER — Ambulatory Visit: Payer: Self-pay

## 2016-03-12 ENCOUNTER — Encounter: Payer: Self-pay | Admitting: Psychiatry

## 2016-03-12 VITALS — BP 142/82 | HR 92 | Temp 97.9°F | Ht 65.0 in | Wt 206.6 lb

## 2016-03-12 DIAGNOSIS — F331 Major depressive disorder, recurrent, moderate: Secondary | ICD-10-CM | POA: Diagnosis not present

## 2016-03-12 MED ORDER — SERTRALINE HCL 100 MG PO TABS: 100.0000 mg | ORAL_TABLET | ORAL | Status: DC

## 2016-03-12 NOTE — Progress Notes (Signed)
Patient ID: Rose Clements, female   DOB: June 28, 1961, 55 y.o.   MRN: [id number] [facility] MD/PA/NP OP Progress Note  03/12/2016 10:54 AM Rose Clements  MRN:  [id number]  Subjective:  Patient returns for follow-up of her major depressive disorder, moderate, recurrent. She was previously seen by Dr. Mayford Knife and this is the first visit for this patient with this clinician. Patient comes to the office in the with a walker. She reports that she has multiple sclerosis and has been doing okay. States that her mood is overall okay. States that she sleeps okay and forgets to take the Ambien. She is able to do her own activities at home. States she takes the Klonopin once daily once in a while. Educated her about the risks with falls along with all her other medications for pain and muscle relaxation.  Chief Complaint:  good Chief Complaint    Follow-up; Medication Refill     Visit Diagnosis:   No diagnosis found.  Past Medical History:  Past Medical History  Diagnosis Date  . Multilevel degenerative disc disease   . Depression   . GERD (gastroesophageal reflux disease)   . Hypertension   . Esophageal dysphagia   . Heartburn   . Dysphagia     Dx by Beverely Risen on 10/15/14  . Multiple sclerosis (HCC)   . Cystitis bacillary, chronic   . Eosinophilic esophagitis 11/19/2014    GE junction benign stricture s/p balloon dilation, retained food contents. ?gastroparesis versus secondary to EE.  . Constipation   . Multiple sclerosis (HCC) 07/16/2015    Past Surgical History  Procedure Laterality Date  . Tonsillectomy  1973  . Cesarean section  1989  . Carpal tunnel release Right 1997  . Cystectomy  2002    From chest  . Ovarian cyst removal Left 2002    Ovarian mass removal  . Abdominal hysterectomy  2003  . Upper gastrointestinal endoscopy  11/19/2014    Benign appearing esophageal stricture-Dilated.  . Kidney donation Left 2002   Family History:  Family History  Problem Relation Age of Onset  .  Diabetes Mother   . Hypertension Mother   . Heart disease Mother   . Pulmonary embolism Mother   . Diabetes Father   . Diabetes Sister   . Hypertension Sister   . Heart disease Sister   . Bipolar disorder Sister   . Heart attack Maternal Grandmother   . Pancreatic cancer Maternal Grandfather   . Narcolepsy Brother    Social History:  Social History   Social History  . Marital Status: Married    Spouse Name: N/A  . Number of Children: N/A  . Years of Education: N/A   Social History Main Topics  . Smoking status: Never Smoker   . Smokeless tobacco: Never Used  . Alcohol Use: No  . Drug Use: No  . Sexual Activity: Not Currently   Other Topics Concern  . None   Social History Narrative   Additional History:   Assessment:   Musculoskeletal: Strength & Muscle Tone: Patient ambulates slowly with a cane. Gait & Station: Slow and with cane Patient leans: N/A  Psychiatric Specialty Exam: Anxiety Patient reports no insomnia, nervous/anxious behavior or suicidal ideas.    Depression        Associated symptoms include does not have insomnia and no suicidal ideas.  Past medical history includes anxiety.     Review of Systems  Psychiatric/Behavioral: Negative for depression, suicidal ideas, hallucinations, memory loss and substance abuse. The  patient is not nervous/anxious and does not have insomnia.   All other systems reviewed and are negative.   Blood pressure 142/82, pulse 92, temperature 97.9 F (36.6 C), temperature source Tympanic, height 5\' 5"  (1.651 m), weight 206 lb 9.6 oz (93.713 kg), SpO2 99 %.Body mass index is 34.38 kg/(m^2).  General Appearance: Neat and Well Groomed  Eye Contact:  Good  Speech:  Clear and Coherent  Volume:  Normal  Mood:  Good  Affect:  Congruent  Thought Process:  Linear and Logical  Orientation:  Full (Time, Place, and Person)  Thought Content:  Negative  Suicidal Thoughts:  No  Homicidal Thoughts:  No  Memory:  Immediate;    Good Recent;   Good Remote;   Good  Judgement:  Good  Insight:  Good  Psychomotor Activity:  Negative  Concentration:  Good  Recall:  Good  Fund of Knowledge: Good  Language: Good  Akathisia:  Negative  Handed:  Right unknown   AIMS (if indicated):  N/A  Assets:  Communication Skills Desire for Improvement Social Support  ADL's:  Intact  Cognition: WNL  Sleep:  Good periodically uses the Ambien    Is the patient at risk to self?  No. Has the patient been a risk to self in the past 6 months?  No. Has the patient been a risk to self within the distant past?  No. Is the patient a risk to others?  No. Has the patient been a risk to others in the past 6 months?  No. Has the patient been a risk to others within the distant past?  No.  Current Medications: Current Outpatient Prescriptions  Medication Sig Dispense Refill  . amitriptyline (ELAVIL) 50 MG tablet Take by mouth.    Marland Kitchen aspirin 81 MG tablet Take 81 mg by mouth daily.    . baclofen (LIORESAL) 20 MG tablet Take 20 mg by mouth 4 (four) times daily.    . calcium carbonate (OS-CAL) 600 MG TABS tablet Take 600 mg by mouth 2 (two) times daily with a meal.    . cetirizine (ZYRTEC) 10 MG tablet Take 10 mg by mouth daily.    . Cholecalciferol (VITAMIN D3) 1000 UNITS CAPS Take 2 capsules by mouth daily.     . clonazePAM (KLONOPIN) 0.5 MG tablet Take 1 tablet (0.5 mg total) by mouth 2 (two) times daily as needed for anxiety. 60 tablet 4  . fluticasone (FLONASE) 50 MCG/ACT nasal spray Place 2 sprays into both nostrils daily.    Marland Kitchen gabapentin (NEURONTIN) 300 MG capsule Take 300 mg by mouth 3 (three) times daily.     Marland Kitchen lactobacillus acidophilus (BACID) TABS tablet Take 2 tablets by mouth daily.    . modafinil (PROVIGIL) 200 MG tablet Take 200 mg by mouth daily.  0  . natalizumab (TYSABRI) 300 MG/15ML injection Inject into the vein.    Marland Kitchen nystatin cream (MYCOSTATIN) apply to affected area twice a day for 7 days then if needed for yeast  infection  0  . pantoprazole (PROTONIX) 40 MG tablet   0  . polyethylene glycol powder (GLYCOLAX/MIRALAX) powder take 17GM (DISSOLVED IN WATER) by mouth twice a day  0  . sertraline (ZOLOFT) 100 MG tablet Take 1 tablet (100 mg total) by mouth every morning. 30 tablet 4  . zolpidem (AMBIEN) 5 MG tablet Take 1 tablet (5 mg total) by mouth at bedtime as needed for sleep. 30 tablet 4   No current facility-administered medications for this visit.  Medical Decision Making:  Established Problem, Stable/Improving (1), Review of Medication Regimen & Side Effects (2) and Review of New Medication or Change in Dosage (2)  Treatment Plan Summary:Medication management and Plan   Major depressive disorder, recurrent, moderate- patient's mood is stable. We will continue sertraline 100 mg daily.    Insomnia-Ambien 5 mg at bedtime as needed for insomnia. Risk and benefits of been discussing patient's able to consent.   Anxiety-Klonopin 0.5 mg once daily as needed. Patient cautioned about the fall risk with the Klonopin and the other muscle relaxants that she takes for her multiple sclerosis. Patient is appreciative of this information.  Patient has been fairly stable on this regimen. She'll follow up in 3 months  .Madgeline Rayo 03/12/2016, 10:54 AM

## 2016-03-13 ENCOUNTER — Ambulatory Visit: Payer: Self-pay

## 2016-03-18 ENCOUNTER — Inpatient Hospital Stay: Payer: Commercial Managed Care - PPO | Attending: Family Medicine

## 2016-03-18 VITALS — BP 126/78 | HR 90 | Temp 99.3°F | Resp 20

## 2016-03-18 DIAGNOSIS — Z79899 Other long term (current) drug therapy: Secondary | ICD-10-CM | POA: Diagnosis not present

## 2016-03-18 DIAGNOSIS — G35 Multiple sclerosis: Secondary | ICD-10-CM | POA: Insufficient documentation

## 2016-03-18 MED ORDER — SODIUM CHLORIDE 0.9 % IV SOLN
Freq: Once | INTRAVENOUS | Status: AC
  Administered 2016-03-18: 10:00:00 via INTRAVENOUS
  Filled 2016-03-18: qty 1000

## 2016-03-18 MED ORDER — ACETAMINOPHEN 500 MG PO TABS
1000.0000 mg | ORAL_TABLET | Freq: Once | ORAL | Status: AC
  Administered 2016-03-18: 1000 mg via ORAL
  Filled 2016-03-18: qty 2

## 2016-03-18 MED ORDER — SODIUM CHLORIDE 0.9 % IV SOLN
300.0000 mg | Freq: Once | INTRAVENOUS | Status: AC
  Administered 2016-03-18: 300 mg via INTRAVENOUS
  Filled 2016-03-18: qty 15

## 2016-03-19 ENCOUNTER — Inpatient Hospital Stay: Payer: Commercial Managed Care - PPO

## 2016-03-26 ENCOUNTER — Telehealth: Payer: Self-pay

## 2016-03-26 MED ORDER — POLYETHYLENE GLYCOL POWD: 17.0000 g | Freq: Every day | Status: DC

## 2016-03-26 NOTE — Telephone Encounter (Signed)
Appointment scheduled for 04/07/16 per Trudie Reed

## 2016-03-26 NOTE — Telephone Encounter (Signed)
Received refill request for patient for Miralax. Patient last saw Lorenza Burton, NP in 06/2015. Refill sent in to pharmacy at this time, but patient will need an appointment prior to another refill being called in.  Please call and schedule patient an appointment with Dr. Servando Snare prior to end of July this year.

## 2016-04-07 ENCOUNTER — Other Ambulatory Visit: Payer: Self-pay

## 2016-04-07 ENCOUNTER — Encounter: Payer: Self-pay | Admitting: Gastroenterology

## 2016-04-07 ENCOUNTER — Ambulatory Visit (INDEPENDENT_AMBULATORY_CARE_PROVIDER_SITE_OTHER): Payer: Commercial Managed Care - PPO | Admitting: Gastroenterology

## 2016-04-07 VITALS — BP 131/78 | HR 78 | Temp 98.4°F | Ht 65.0 in | Wt 207.2 lb

## 2016-04-07 DIAGNOSIS — R1310 Dysphagia, unspecified: Secondary | ICD-10-CM

## 2016-04-07 DIAGNOSIS — K5900 Constipation, unspecified: Secondary | ICD-10-CM

## 2016-04-07 DIAGNOSIS — K5909 Other constipation: Secondary | ICD-10-CM

## 2016-04-07 NOTE — Progress Notes (Signed)
Primary Care Physician: Carlean Jews, NP  Primary Gastroenterologist:  Dr. [name]  Chief Complaint  Patient presents with  . Gastroesophageal Reflux  . Abdominal Pain  . Weight Gain    HPI: Rose Clements is a 55 y.o. female here because of dysphagia. The patient reports that she has problems swallowing both to solids and liquids like she had in the past prior to being dilated in 2015. The patient denies any unexplained weight loss and states she has been gaining weight. There is no report of any abdominal pain nausea vomiting fevers or chills. The patient also reports that she was on MiraLAX which was helping her with her constipation but states that her insurance only covers 1 bottle per year.  Current Outpatient Prescriptions  Medication Sig Dispense Refill  . amitriptyline (ELAVIL) 50 MG tablet Take by mouth.    Marland Kitchen aspirin 81 MG tablet Take 81 mg by mouth daily.    . baclofen (LIORESAL) 20 MG tablet Take 20 mg by mouth 4 (four) times daily.    . calcium carbonate (OS-CAL) 600 MG TABS tablet Take 600 mg by mouth 2 (two) times daily with a meal.    . cetirizine (ZYRTEC) 10 MG tablet Take 10 mg by mouth daily.    . Cholecalciferol (VITAMIN D3) 1000 UNITS CAPS Take 2 capsules by mouth daily.     . clonazePAM (KLONOPIN) 0.5 MG tablet Take 1 tablet (0.5 mg total) by mouth 2 (two) times daily as needed for anxiety. 60 tablet 4  . fluticasone (FLONASE) 50 MCG/ACT nasal spray Place 2 sprays into both nostrils daily.    Marland Kitchen gabapentin (NEURONTIN) 300 MG capsule Take 300 mg by mouth 3 (three) times daily.     Marland Kitchen lactobacillus acidophilus (BACID) TABS tablet Take 2 tablets by mouth daily.    . modafinil (PROVIGIL) 200 MG tablet Take 200 mg by mouth daily.  0  . natalizumab (TYSABRI) 300 MG/15ML injection Inject into the vein.    Marland Kitchen nystatin cream (MYCOSTATIN) apply to affected area twice a day for 7 days then if needed for yeast infection  0  . pantoprazole (PROTONIX) 40 MG tablet   0  .  Polyethylene Glycol POWD Take 17 g by mouth daily. 527 g 3  . sertraline (ZOLOFT) 100 MG tablet Take 1 tablet (100 mg total) by mouth every morning. 30 tablet 4  . zolpidem (AMBIEN) 5 MG tablet Take 1 tablet (5 mg total) by mouth at bedtime as needed for sleep. 30 tablet 4   No current facility-administered medications for this visit.    Allergies as of 04/07/2016  . (No Known Allergies)    ROS:  General: Negative for anorexia, weight loss, fever, chills, fatigue, weakness. ENT: Negative for hoarseness, difficulty swallowing , nasal congestion. CV: Negative for chest pain, angina, palpitations, dyspnea on exertion, peripheral edema.  Respiratory: Negative for dyspnea at rest, dyspnea on exertion, cough, sputum, wheezing.  GI: See history of present illness. GU:  Negative for dysuria, hematuria, urinary incontinence, urinary frequency, nocturnal urination.  Endo: Negative for unusual weight change.    Physical Examination:   BP 131/78 mmHg  Pulse 78  Temp(Src) 98.4 F (36.9 C) (Oral)  Ht 5\' 5"  (1.651 m)  Wt 207 lb 3.2 oz (93.985 kg)  BMI 34.48 kg/m2  General: Well-nourished, well-developed in no acute distress.  Eyes: No icterus. Conjunctivae pink. Mouth: Oropharyngeal mucosa moist and pink , no lesions erythema or exudate. Lungs: Clear to auscultation bilaterally. Non-labored. Heart:  Regular rate and rhythm, no murmurs rubs or gallops.  Abdomen: Bowel sounds are normal, nontender, nondistended, no hepatosplenomegaly or masses, no abdominal bruits or hernia , no rebound or guarding.   Extremities: No lower extremity edema. No clubbing or deformities. Neuro: Alert and oriented x 3.  Grossly intact. Skin: Warm and dry, no jaundice.   Psych: Alert and cooperative, normal mood and affect.  Labs:    Imaging Studies: No results found.  Assessment and Plan:   Rose Clements is a 55 y.o. y/o female who comes in today with dysphagia that has returned. The patient will be set up  for an EGD. The patient has also had the MiraLAX sent off again and prior authorization will be done if needed. The patient has been explained the plan and agrees with it.   Note: This dictation was prepared with Dragon dictation along with smaller phrase technology. Any transcriptional errors that result from this process are unintentional.

## 2016-04-09 ENCOUNTER — Ambulatory Visit: Payer: Self-pay

## 2016-04-15 ENCOUNTER — Inpatient Hospital Stay: Payer: Commercial Managed Care - PPO | Attending: Family Medicine

## 2016-04-15 VITALS — BP 131/84 | HR 86 | Temp 97.0°F | Resp 18

## 2016-04-15 DIAGNOSIS — G35 Multiple sclerosis: Secondary | ICD-10-CM | POA: Diagnosis not present

## 2016-04-15 DIAGNOSIS — Z79899 Other long term (current) drug therapy: Secondary | ICD-10-CM | POA: Insufficient documentation

## 2016-04-15 MED ORDER — SODIUM CHLORIDE 0.9 % IV SOLN
Freq: Once | INTRAVENOUS | Status: AC
Start: 2016-04-15 — End: 2016-04-15
  Administered 2016-04-15: 12:00:00 via INTRAVENOUS
  Filled 2016-04-15: qty 1000

## 2016-04-15 MED ORDER — ACETAMINOPHEN 500 MG PO TABS
1000.0000 mg | ORAL_TABLET | Freq: Once | ORAL | Status: AC
  Administered 2016-04-15: 1000 mg via ORAL
  Filled 2016-04-15: qty 2

## 2016-04-15 MED ORDER — SODIUM CHLORIDE 0.9 % IV SOLN
300.0000 mg | Freq: Once | INTRAVENOUS | Status: AC
  Administered 2016-04-15: 300 mg via INTRAVENOUS
  Filled 2016-04-15: qty 15

## 2016-04-16 ENCOUNTER — Inpatient Hospital Stay: Payer: Commercial Managed Care - PPO

## 2016-05-07 ENCOUNTER — Ambulatory Visit: Payer: Self-pay

## 2016-05-13 ENCOUNTER — Inpatient Hospital Stay: Payer: Commercial Managed Care - PPO | Attending: Family Medicine

## 2016-05-13 VITALS — BP 134/84 | HR 76 | Temp 97.3°F | Resp 18

## 2016-05-13 DIAGNOSIS — Z79899 Other long term (current) drug therapy: Secondary | ICD-10-CM | POA: Insufficient documentation

## 2016-05-13 DIAGNOSIS — G35 Multiple sclerosis: Secondary | ICD-10-CM | POA: Insufficient documentation

## 2016-05-13 MED ORDER — SODIUM CHLORIDE 0.9 % IV SOLN
Freq: Once | INTRAVENOUS | Status: AC
  Administered 2016-05-13: 11:00:00 via INTRAVENOUS
  Filled 2016-05-13: qty 1000

## 2016-05-13 MED ORDER — ACETAMINOPHEN 500 MG PO TABS
1000.0000 mg | ORAL_TABLET | Freq: Once | ORAL | Status: AC
  Administered 2016-05-13: 1000 mg via ORAL
  Filled 2016-05-13: qty 2

## 2016-05-13 MED ORDER — SODIUM CHLORIDE 0.9 % IV SOLN
300.0000 mg | Freq: Once | INTRAVENOUS | Status: AC
  Administered 2016-05-13: 300 mg via INTRAVENOUS
  Filled 2016-05-13: qty 15

## 2016-05-14 ENCOUNTER — Inpatient Hospital Stay: Payer: Commercial Managed Care - PPO

## 2016-05-18 ENCOUNTER — Other Ambulatory Visit: Payer: Self-pay

## 2016-05-18 MED ORDER — POLYETHYLENE GLYCOL POWD: 17.0000 g | Freq: Every day | Status: DC

## 2016-05-19 ENCOUNTER — Ambulatory Visit: Payer: Commercial Managed Care - PPO | Admitting: Anesthesiology

## 2016-05-19 ENCOUNTER — Encounter
Admission: RE | Disposition: A | Discharge status: Home / Self Care | Payer: Self-pay | Source: Ambulatory Visit | Attending: Gastroenterology

## 2016-05-19 ENCOUNTER — Encounter: Payer: Self-pay | Admitting: *Deleted

## 2016-05-19 ENCOUNTER — Ambulatory Visit
Admission: RE | Admit: 2016-05-19 | Discharge: 2016-05-19 | Disposition: A | Discharge status: Home / Self Care | Payer: Commercial Managed Care - PPO | Source: Ambulatory Visit | Attending: Gastroenterology | Admitting: Gastroenterology

## 2016-05-19 DIAGNOSIS — F329 Major depressive disorder, single episode, unspecified: Secondary | ICD-10-CM | POA: Insufficient documentation

## 2016-05-19 DIAGNOSIS — Z90710 Acquired absence of both cervix and uterus: Secondary | ICD-10-CM | POA: Diagnosis not present

## 2016-05-19 DIAGNOSIS — Z7951 Long term (current) use of inhaled steroids: Secondary | ICD-10-CM | POA: Diagnosis not present

## 2016-05-19 DIAGNOSIS — G35 Multiple sclerosis: Secondary | ICD-10-CM | POA: Insufficient documentation

## 2016-05-19 DIAGNOSIS — Z79899 Other long term (current) drug therapy: Secondary | ICD-10-CM | POA: Diagnosis not present

## 2016-05-19 DIAGNOSIS — Z8489 Family history of other specified conditions: Secondary | ICD-10-CM | POA: Insufficient documentation

## 2016-05-19 DIAGNOSIS — R1310 Dysphagia, unspecified: Secondary | ICD-10-CM | POA: Diagnosis not present

## 2016-05-19 DIAGNOSIS — K219 Gastro-esophageal reflux disease without esophagitis: Secondary | ICD-10-CM | POA: Insufficient documentation

## 2016-05-19 DIAGNOSIS — Z905 Acquired absence of kidney: Secondary | ICD-10-CM | POA: Diagnosis not present

## 2016-05-19 DIAGNOSIS — Z9889 Other specified postprocedural states: Secondary | ICD-10-CM | POA: Diagnosis not present

## 2016-05-19 DIAGNOSIS — N3020 Other chronic cystitis without hematuria: Secondary | ICD-10-CM | POA: Insufficient documentation

## 2016-05-19 DIAGNOSIS — K222 Esophageal obstruction: Secondary | ICD-10-CM | POA: Insufficient documentation

## 2016-05-19 DIAGNOSIS — Z7982 Long term (current) use of aspirin: Secondary | ICD-10-CM | POA: Diagnosis not present

## 2016-05-19 DIAGNOSIS — I10 Essential (primary) hypertension: Secondary | ICD-10-CM | POA: Insufficient documentation

## 2016-05-19 HISTORY — PX: ESOPHAGOGASTRODUODENOSCOPY (EGD) WITH PROPOFOL: SHX5813

## 2016-05-19 SURGERY — ESOPHAGOGASTRODUODENOSCOPY (EGD) WITH PROPOFOL
Anesthesia: General

## 2016-05-19 MED ORDER — PROPOFOL 10 MG/ML IV BOLUS
INTRAVENOUS | Status: DC | PRN
  Administered 2016-05-19: 40 mg via INTRAVENOUS

## 2016-05-19 MED ORDER — SODIUM CHLORIDE 0.9 % IV SOLN
INTRAVENOUS | Status: DC
  Administered 2016-05-19: 16:00:00 via INTRAVENOUS
  Administered 2016-05-19: 1000 mL via INTRAVENOUS

## 2016-05-19 MED ORDER — MIDAZOLAM HCL 5 MG/5ML IJ SOLN
INTRAMUSCULAR | Status: DC | PRN
  Administered 2016-05-19: 2 mg via INTRAVENOUS

## 2016-05-19 MED ORDER — LIDOCAINE 2% (20 MG/ML) 5 ML SYRINGE
INTRAMUSCULAR | Status: DC | PRN
  Administered 2016-05-19: 100 mg via INTRAVENOUS

## 2016-05-19 MED ORDER — PROPOFOL 500 MG/50ML IV EMUL
INTRAVENOUS | Status: DC | PRN
  Administered 2016-05-19: 180 ug/kg/min via INTRAVENOUS

## 2016-05-19 MED ORDER — FENTANYL CITRATE (PF) 100 MCG/2ML IJ SOLN
INTRAMUSCULAR | Status: DC | PRN
  Administered 2016-05-19: 50 ug via INTRAVENOUS

## 2016-05-19 NOTE — H&P (Signed)
[name], MD [facility] [address], [address] [place], [place] [address] Phone: [phone] Fax : [phone]  Primary Care Physician:  Carlean Jews, NP Primary Gastroenterologist:  Dr. Servando Snare  Pre-Procedure History & Physical: HPI:  Rose Clements is a 55 y.o. female is here for an endoscopy.   Past Medical History  Diagnosis Date  . Multilevel degenerative disc disease   . Depression   . GERD (gastroesophageal reflux disease)   . Hypertension   . Esophageal dysphagia   . Heartburn   . Dysphagia     Dx by Beverely Risen on 10/15/14  . Multiple sclerosis (HCC)   . Cystitis bacillary, chronic   . Eosinophilic esophagitis 11/19/2014    GE junction benign stricture s/p balloon dilation, retained food contents. ?gastroparesis versus secondary to EE.  . Constipation   . Multiple sclerosis (HCC) 07/16/2015    Past Surgical History  Procedure Laterality Date  . Tonsillectomy  1973  . Cesarean section  1989  . Carpal tunnel release Right 1997  . Cystectomy  2002    From chest  . Ovarian cyst removal Left 2002    Ovarian mass removal  . Abdominal hysterectomy  2003  . Upper gastrointestinal endoscopy  11/19/2014    Benign appearing esophageal stricture-Dilated.  . Kidney donation Left 2002    Prior to Admission medications   Medication Sig Start Date End Date Taking? Authorizing Provider  aspirin 81 MG tablet Take 81 mg by mouth daily.   Yes Historical Provider, MD  baclofen (LIORESAL) 20 MG tablet Take 20 mg by mouth 4 (four) times daily.   Yes Historical Provider, MD  calcium carbonate (OS-CAL) 600 MG TABS tablet Take 600 mg by mouth 2 (two) times daily with a meal.   Yes Historical Provider, MD  cetirizine (ZYRTEC) 10 MG tablet Take 10 mg by mouth daily.   Yes Historical Provider, MD  Cholecalciferol (VITAMIN D3) 1000 UNITS CAPS Take 2 capsules by mouth daily.    Yes Historical Provider, MD  fluticasone (FLONASE) 50 MCG/ACT nasal spray Place 2 sprays into both nostrils daily.    Yes Historical Provider, MD  gabapentin (NEURONTIN) 300 MG capsule Take 300 mg by mouth 3 (three) times daily. Reported on 05/19/2016 10/14/15 10/13/16 Yes Historical Provider, MD  lactobacillus acidophilus (BACID) TABS tablet Take 2 tablets by mouth daily.   Yes Historical Provider, MD  modafinil (PROVIGIL) 200 MG tablet Take 200 mg by mouth daily. 08/02/15  Yes Historical Provider, MD  natalizumab (TYSABRI) 300 MG/15ML injection Inject into the vein.   Yes Historical Provider, MD  nystatin cream (MYCOSTATIN) apply to affected area twice a day for 7 days then if needed for yeast infection 06/17/15  Yes Historical Provider, MD  pantoprazole (PROTONIX) 40 MG tablet  02/21/16  Yes Historical Provider, MD  sertraline (ZOLOFT) 100 MG tablet Take 1 tablet (100 mg total) by mouth every morning. 03/12/16  Yes Himabindu Ravi, MD  zolpidem (AMBIEN) 5 MG tablet Take 1 tablet (5 mg total) by mouth at bedtime as needed for sleep. 12/13/15  Yes Kerin Salen, MD  amitriptyline (ELAVIL) 50 MG tablet Take by mouth. Reported on 05/19/2016 02/04/16   Historical Provider, MD  clonazePAM (KLONOPIN) 0.5 MG tablet Take 1 tablet (0.5 mg total) by mouth 2 (two) times daily as needed for anxiety. Patient not taking: Reported on 05/19/2016 12/13/15   Kerin Salen, MD  Polyethylene Glycol POWD Take 17 g by mouth daily. 05/18/16   [name], MD  Allergies as of 05/18/2016  . (No Known Allergies)    Family History  Problem Relation Age of Onset  . Diabetes Mother   . Hypertension Mother   . Heart disease Mother   . Pulmonary embolism Mother   . Diabetes Father   . Diabetes Sister   . Hypertension Sister   . Heart disease Sister   . Bipolar disorder Sister   . Heart attack Maternal Grandmother   . Pancreatic cancer Maternal Grandfather   . Narcolepsy Brother     Social History   Social History  . Marital Status: Married    Spouse Name: N/A  . Number of Children: N/A  . Years of Education: N/A    Occupational History  . Not on file.   Social History Main Topics  . Smoking status: Never Smoker   . Smokeless tobacco: Never Used  . Alcohol Use: No  . Drug Use: No  . Sexual Activity: Not Currently   Other Topics Concern  . Not on file   Social History Narrative    Review of Systems: See HPI, otherwise negative ROS  Physical Exam: BP 129/86 mmHg  Pulse 72  Temp(Src) 97.1 F (36.2 C) (Tympanic)  Resp 18  Ht  (1.651 m)  Wt 206 lb (93.441 kg)  BMI 34.28 kg/m2  SpO2 100% General:   Alert,  pleasant and cooperative in NAD Head:  Normocephalic and atraumatic. Neck:  Supple; no masses or thyromegaly. Lungs:  Clear throughout to auscultation.    Heart:  Regular rate and rhythm. Abdomen:  Soft, nontender and nondistended. Normal bowel sounds, without guarding, and without rebound.   Neurologic:  Alert and  oriented x4;  grossly normal neurologically.  Impression/Plan: Rose Clements is here for an endoscopy to be performed for dysphagia  Risks, benefits, limitations, and alternatives regarding  endoscopy have been reviewed with the patient.  Questions have been answered.  All parties agreeable.   [name], MD  05/19/2016, 4:18 PM

## 2016-05-19 NOTE — Anesthesia Preprocedure Evaluation (Signed)
Anesthesia Evaluation  Patient identified by MRN, date of birth, ID band Patient awake    Reviewed: Allergy & Precautions, NPO status , Patient's Chart, lab work & pertinent test results  Airway Mallampati: II  TM Distance: >3 FB     Dental  (+) Chipped   Pulmonary neg pulmonary ROS,    Pulmonary exam normal        Cardiovascular hypertension, Pt. on medications Normal cardiovascular exam     Neuro/Psych Anxiety Depression MS Hx  Neuromuscular disease    GI/Hepatic Neg liver ROS, GERD  Medicated and Controlled,dysphagia   Endo/Other  negative endocrine ROS  Renal/GU negative Renal ROS Bladder dysfunction      Musculoskeletal  (+) Arthritis , Osteoarthritis,  Degenerative disc disease   Abdominal Normal abdominal exam  (+)   Peds negative pediatric ROS (+)  Hematology negative hematology ROS (+)   Anesthesia Other Findings MS Hx Chronic cystitis  Reproductive/Obstetrics                             Anesthesia Physical Anesthesia Plan  ASA: III  Anesthesia Plan: General   Post-op Pain Management:    Induction: Intravenous  Airway Management Planned: Nasal Cannula  Additional Equipment:   Intra-op Plan:   Post-operative Plan:   Informed Consent: I have reviewed the patients History and Physical, chart, labs and discussed the procedure including the risks, benefits and alternatives for the proposed anesthesia with the patient or authorized representative who has indicated his/her understanding and acceptance.   Dental advisory given  Plan Discussed with: CRNA and Surgeon  Anesthesia Plan Comments:         Anesthesia Quick Evaluation

## 2016-05-19 NOTE — Op Note (Signed)
[facility] Gastroenterology Patient Name: Rose Clements Procedure Date: 05/19/2016 11:49 AM MRN: [id number] Account #: [id number] Date of Birth: 1961/12/06 Admit Type: Outpatient Age: 55 Room: [facility] ENDO ROOM 4 Gender: Female Note Status: Finalized Procedure:            Upper GI endoscopy Indications:          Dysphagia Providers:            [name], MD Referring MD:         Yevonne Pax, MD (Referring MD) Medicines:            Propofol per Anesthesia Complications:        No immediate complications. Procedure:            Pre-Anesthesia Assessment:                       - Prior to the procedure, a History and Physical was                        performed, and patient medications and allergies were                        reviewed. The patient's tolerance of previous                        anesthesia was also reviewed. The risks and benefits of                        the procedure and the sedation options and risks were                        discussed with the patient. All questions were                        answered, and informed consent was obtained. Prior                        Anticoagulants: The patient has taken no previous                        anticoagulant or antiplatelet agents. ASA Grade                        Assessment: II - A patient with mild systemic disease.                        After reviewing the risks and benefits, the patient was                        deemed in satisfactory condition to undergo the                        procedure.                       After obtaining informed consent, the endoscope was                        passed under direct vision. Throughout the procedure,  the patient's blood pressure, pulse, and oxygen                        saturations were monitored continuously. The Endoscope                        was introduced through the mouth, and advanced to the                        second  part of duodenum. The upper GI endoscopy was                        accomplished without difficulty. The patient tolerated                        the procedure well. Findings:      One mild benign-appearing, intrinsic stenosis was found at the       gastroesophageal junction. And was traversed. A TTS dilator was passed       through the scope. Dilation with a 15-16.5-18 mm balloon dilator was       performed to 18 mm. The dilation site was examined following endoscope       reinsertion and showed complete resolution of luminal narrowing.      The stomach was normal.      The examined duodenum was normal. Impression:           - Benign-appearing esophageal stenosis. Dilated.                       - Normal stomach.                       - Normal examined duodenum.                       - No specimens collected. Recommendation:       - Repeat upper endoscopy PRN for retreatment. Procedure Code(s):    --- Professional ---                       [phone], Esophagogastroduodenoscopy, flexible, transoral;                        with transendoscopic balloon dilation of esophagus                        (less than 30 mm diameter) Diagnosis Code(s):    --- Professional ---                       R13.10, Dysphagia, unspecified                       K22.2, Esophageal obstruction CPT copyright 2016 American Medical Association. All rights reserved. The codes documented in this report are preliminary and upon coder review may  be revised to meet current compliance requirements. [name], MD 05/19/2016 4:31:22 PM This report has been signed electronically. Number of Addenda: 0 Note Initiated On: 05/19/2016 11:49 AM      [facility]

## 2016-05-19 NOTE — Anesthesia Procedure Notes (Signed)
Date/Time: 05/19/2016 4:15 PM Performed by: Henrietta Hoover Pre-anesthesia Checklist: Patient identified, Emergency Drugs available, Suction available, Patient being monitored and Timeout performed Patient Re-evaluated:Patient Re-evaluated prior to inductionOxygen Delivery Method: Nasal cannula

## 2016-05-19 NOTE — Transfer of Care (Signed)
Immediate Anesthesia Transfer of Care Note  Patient: Rose Clements  Procedure(s) Performed: Procedure(s): ESOPHAGOGASTRODUODENOSCOPY (EGD) WITH PROPOFOL (N/A)  Patient Location: PACU  Anesthesia Type:General  Level of Consciousness: awake  Airway & Oxygen Therapy: Patient Spontanous Breathing and Patient connected to nasal cannula oxygen  Post-op Assessment: Report given to RN and Post -op Vital signs reviewed and stable  Post vital signs: Reviewed and stable  Last Vitals:  Filed Vitals:   05/19/16 1530  BP: 129/86  Pulse: 72  Temp: 36.2 C  Resp: 18    Last Pain:  Filed Vitals:   05/19/16 1640  PainSc: 8          Complications: No apparent anesthesia complications

## 2016-05-20 ENCOUNTER — Encounter: Payer: Self-pay | Admitting: Gastroenterology

## 2016-05-20 NOTE — Anesthesia Postprocedure Evaluation (Signed)
Anesthesia Post Note  Patient: Mesa Stockley  Procedure(s) Performed: Procedure(s) (LRB): ESOPHAGOGASTRODUODENOSCOPY (EGD) WITH PROPOFOL (N/A)  Patient location during evaluation: PACU Anesthesia Type: General Level of consciousness: awake and alert and oriented Pain management: pain level controlled Vital Signs Assessment: post-procedure vital signs reviewed and stable Respiratory status: spontaneous breathing Cardiovascular status: blood pressure returned to baseline Anesthetic complications: no    Last Vitals:  Filed Vitals:   05/19/16 1700 05/19/16 1710  BP: 124/56 107/85  Pulse: 77 76  Temp:    Resp: 10 13    Last Pain:  Filed Vitals:   05/19/16 1712  PainSc: 8                  Lovelyn Sheeran

## 2016-06-04 ENCOUNTER — Ambulatory Visit: Payer: Self-pay

## 2016-06-10 ENCOUNTER — Inpatient Hospital Stay: Payer: Commercial Managed Care - PPO | Attending: Family Medicine

## 2016-06-10 VITALS — BP 131/76 | HR 71 | Temp 98.0°F | Resp 18

## 2016-06-10 DIAGNOSIS — G35 Multiple sclerosis: Secondary | ICD-10-CM

## 2016-06-10 DIAGNOSIS — Z79899 Other long term (current) drug therapy: Secondary | ICD-10-CM | POA: Insufficient documentation

## 2016-06-10 MED ORDER — SODIUM CHLORIDE 0.9 % IV SOLN
300.0000 mg | Freq: Once | INTRAVENOUS | Status: AC
  Administered 2016-06-10: 300 mg via INTRAVENOUS
  Filled 2016-06-10: qty 15

## 2016-06-10 MED ORDER — ACETAMINOPHEN 500 MG PO TABS
1000.0000 mg | ORAL_TABLET | Freq: Once | ORAL | Status: AC
  Administered 2016-06-10: 1000 mg via ORAL
  Filled 2016-06-10: qty 2

## 2016-06-10 MED ORDER — SODIUM CHLORIDE 0.9 % IV SOLN
Freq: Once | INTRAVENOUS | Status: AC
  Administered 2016-06-10: 10:00:00 via INTRAVENOUS
  Filled 2016-06-10: qty 1000

## 2016-06-11 ENCOUNTER — Inpatient Hospital Stay: Payer: Commercial Managed Care - PPO

## 2016-06-11 ENCOUNTER — Ambulatory Visit (INDEPENDENT_AMBULATORY_CARE_PROVIDER_SITE_OTHER): Payer: 59 | Admitting: Psychiatry

## 2016-06-11 ENCOUNTER — Encounter: Payer: Self-pay | Admitting: Psychiatry

## 2016-06-11 VITALS — BP 122/70 | HR 74 | Temp 97.6°F | Ht 65.0 in | Wt 209.4 lb

## 2016-06-11 DIAGNOSIS — F331 Major depressive disorder, recurrent, moderate: Secondary | ICD-10-CM | POA: Diagnosis not present

## 2016-06-11 DIAGNOSIS — F411 Generalized anxiety disorder: Secondary | ICD-10-CM

## 2016-06-11 MED ORDER — SERTRALINE HCL 100 MG PO TABS: 150.0000 mg | ORAL_TABLET | ORAL | Status: DC

## 2016-06-11 NOTE — Progress Notes (Signed)
Patient ID: Rose Clements, female   DOB: 1961/06/23, 55 y.o.   MRN: [id number] [facility] MD/PA/NP OP Progress Note  06/11/2016 11:23 AM Rose Clements  MRN:  [id number]  Subjective:  Patient returns for follow-up of her major depressive disorder, moderate, recurrent. Patient comes to the office in the with a walker. States she was able to taper off the klonopin. States she has more anxious, states she has a lot of social anxiety when she talks to new people. States it was her mother`s death anniversary and she finds this time upsetting. States being restricted by her MS is frustrating for her. She is able to do her own activities at home to an extent.. She takes the Ambien occasionally, states she forgets since she takes so many medications. She sees Felecia Jan for therapy and is working on some strategies to cope with her anxieties.   Chief Complaint:  good Chief Complaint    Follow-up; Medication Refill     Visit Diagnosis:     ICD-9-CM ICD-10-CM   1. Major depressive disorder, recurrent episode, moderate (HCC) 296.32 F33.1   2. Anxiety state 300.00 F41.1     Past Medical History:  Past Medical History  Diagnosis Date  . Multilevel degenerative disc disease   . Depression   . GERD (gastroesophageal reflux disease)   . Hypertension   . Esophageal dysphagia   . Heartburn   . Dysphagia     Dx by Beverely Risen on 10/15/14  . Multiple sclerosis (HCC)   . Cystitis bacillary, chronic   . Eosinophilic esophagitis 11/19/2014    GE junction benign stricture s/p balloon dilation, retained food contents. ?gastroparesis versus secondary to EE.  . Constipation   . Multiple sclerosis (HCC) 07/16/2015    Past Surgical History  Procedure Laterality Date  . Tonsillectomy  1973  . Cesarean section  1989  . Carpal tunnel release Right 1997  . Cystectomy  2002    From chest  . Ovarian cyst removal Left 2002    Ovarian mass removal  . Abdominal hysterectomy  2003  . Upper gastrointestinal endoscopy   11/19/2014    Benign appearing esophageal stricture-Dilated.  . Kidney donation Left 2002  . Esophagogastroduodenoscopy (egd) with propofol N/A 05/19/2016    Procedure: ESOPHAGOGASTRODUODENOSCOPY (EGD) WITH PROPOFOL;  Surgeon: [name], MD;  Location: [facility] ENDOSCOPY;  Service: Endoscopy;  Laterality: N/A;   Family History:  Family History  Problem Relation Age of Onset  . Diabetes Mother   . Hypertension Mother   . Heart disease Mother   . Pulmonary embolism Mother   . Diabetes Father   . Diabetes Sister   . Hypertension Sister   . Heart disease Sister   . Bipolar disorder Sister   . Heart attack Maternal Grandmother   . Pancreatic cancer Maternal Grandfather   . Narcolepsy Brother    Social History:  Social History   Social History  . Marital Status: Married    Spouse Name: N/A  . Number of Children: N/A  . Years of Education: N/A   Social History Main Topics  . Smoking status: Never Smoker   . Smokeless tobacco: Never Used  . Alcohol Use: No  . Drug Use: No  . Sexual Activity: Not Currently   Other Topics Concern  . None   Social History Narrative   Additional History:   Assessment:   Musculoskeletal: Strength & Muscle Tone: Patient ambulates slowly with a cane. Gait & Station: Slow and with cane Patient leans: N/A  Psychiatric Specialty Exam: Anxiety Patient reports no insomnia, nervous/anxious behavior or suicidal ideas.    Depression        Associated symptoms include does not have insomnia and no suicidal ideas.  Past medical history includes anxiety.     Review of Systems  Psychiatric/Behavioral: Negative for depression, suicidal ideas, hallucinations, memory loss and substance abuse. The patient is not nervous/anxious and does not have insomnia.   All other systems reviewed and are negative.   Blood pressure 122/70, pulse 74, temperature 97.6 F (36.4 C), temperature source Tympanic, height 5\' 5"  (1.651 m), weight 209 lb 6.4 oz (94.983 kg),  SpO2 96 %.Body mass index is 34.85 kg/(m^2).  General Appearance: Neat and Well Groomed  Eye Contact:  Good  Speech:  Clear and Coherent  Volume:  Normal  Mood:  Good  Affect:  Congruent  Thought Process:  Linear and Logical  Orientation:  Full (Time, Place, and Person)  Thought Content:  Negative  Suicidal Thoughts:  No  Homicidal Thoughts:  No  Memory:  Immediate;   Good Recent;   Good Remote;   Good  Judgement:  Good  Insight:  Good  Psychomotor Activity:  Negative  Concentration:  Good  Recall:  Good  Fund of Knowledge: Good  Language: Good  Akathisia:  Negative  Handed:  Right unknown   AIMS (if indicated):  N/A  Assets:  Communication Skills Desire for Improvement Social Support  ADL's:  Intact  Cognition: WNL  Sleep:  Good periodically uses the Ambien    Is the patient at risk to self?  No. Has the patient been a risk to self in the past 6 months?  No. Has the patient been a risk to self within the distant past?  No. Is the patient a risk to others?  No. Has the patient been a risk to others in the past 6 months?  No. Has the patient been a risk to others within the distant past?  No.  Current Medications: Current Outpatient Prescriptions  Medication Sig Dispense Refill  . aspirin 81 MG tablet Take 81 mg by mouth daily.    . baclofen (LIORESAL) 20 MG tablet Take 20 mg by mouth 4 (four) times daily.    . calcium carbonate (OS-CAL) 600 MG TABS tablet Take 600 mg by mouth 2 (two) times daily with a meal.    . cetirizine (ZYRTEC) 10 MG tablet Take 10 mg by mouth daily.    . Cholecalciferol (VITAMIN D3) 1000 UNITS CAPS Take 2 capsules by mouth daily.     . fluticasone (FLONASE) 50 MCG/ACT nasal spray Place 2 sprays into both nostrils daily.    Marland Kitchen gabapentin (NEURONTIN) 300 MG capsule Take 300 mg by mouth 3 (three) times daily. Reported on 05/19/2016    . lactobacillus acidophilus (BACID) TABS tablet Take 2 tablets by mouth daily.    . modafinil (PROVIGIL) 200 MG  tablet Take 200 mg by mouth daily.  0  . natalizumab (TYSABRI) 300 MG/15ML injection Inject into the vein.    . pantoprazole (PROTONIX) 40 MG tablet   0  . Polyethylene Glycol POWD Take 17 g by mouth daily. 527 g 3  . sertraline (ZOLOFT) 100 MG tablet Take 1 tablet (100 mg total) by mouth every morning. 30 tablet 4  . zolpidem (AMBIEN) 5 MG tablet Take 1 tablet (5 mg total) by mouth at bedtime as needed for sleep. 30 tablet 4   No current facility-administered medications for this visit.  Medical Decision Making:  Established Problem, Stable/Improving (1), Review of Medication Regimen & Side Effects (2) and Review of New Medication or Change in Dosage (2)  Treatment Plan Summary:Medication management and Plan   Major depressive disorder, recurrent, moderate- patient's mood is stable. We will increase sertraline to150 mg daily.    Insomnia-Ambien 5 mg at bedtime as needed for insomnia. Risk and benefits of been discussing patient's able to consent.  Anxiety-Same as above.  RTC in 2 months or call before if necessary.  Marland KitchenRAVI, Niyonna Betsill 06/11/2016, 11:23 AM

## 2016-07-02 ENCOUNTER — Ambulatory Visit: Payer: Self-pay

## 2016-07-03 ENCOUNTER — Ambulatory Visit: Payer: Self-pay

## 2016-07-08 ENCOUNTER — Inpatient Hospital Stay: Payer: Commercial Managed Care - PPO

## 2016-07-09 ENCOUNTER — Inpatient Hospital Stay: Payer: Commercial Managed Care - PPO

## 2016-07-10 ENCOUNTER — Inpatient Hospital Stay: Payer: Commercial Managed Care - PPO | Attending: Family Medicine

## 2016-07-10 VITALS — BP 127/81 | HR 60 | Temp 98.4°F

## 2016-07-10 DIAGNOSIS — Z79899 Other long term (current) drug therapy: Secondary | ICD-10-CM | POA: Insufficient documentation

## 2016-07-10 DIAGNOSIS — G35 Multiple sclerosis: Secondary | ICD-10-CM | POA: Insufficient documentation

## 2016-07-10 MED ORDER — SODIUM CHLORIDE 0.9 % IV SOLN
300.0000 mg | Freq: Once | INTRAVENOUS | Status: AC
  Administered 2016-07-10: 300 mg via INTRAVENOUS
  Filled 2016-07-10: qty 15

## 2016-07-10 MED ORDER — ACETAMINOPHEN 500 MG PO TABS
1000.0000 mg | ORAL_TABLET | Freq: Once | ORAL | Status: AC
  Administered 2016-07-10: 1000 mg via ORAL
  Filled 2016-07-10: qty 2

## 2016-07-10 MED ORDER — SODIUM CHLORIDE 0.9 % IV SOLN
Freq: Once | INTRAVENOUS | Status: AC
  Administered 2016-07-10: 10:00:00 via INTRAVENOUS
  Filled 2016-07-10: qty 1000

## 2016-07-10 NOTE — Progress Notes (Signed)
Patient would not stay 1 hour post infusion.  Dr. Orlie Dakin is aware.

## 2016-07-30 ENCOUNTER — Ambulatory Visit: Payer: Self-pay | Admitting: Family Medicine

## 2016-07-30 ENCOUNTER — Ambulatory Visit: Payer: Self-pay

## 2016-08-05 ENCOUNTER — Ambulatory Visit: Payer: Self-pay

## 2016-08-05 ENCOUNTER — Ambulatory Visit: Payer: Self-pay | Admitting: Hematology and Oncology

## 2016-08-12 ENCOUNTER — Inpatient Hospital Stay: Payer: Commercial Managed Care - PPO

## 2016-08-12 ENCOUNTER — Inpatient Hospital Stay: Payer: Commercial Managed Care - PPO | Attending: Family Medicine | Admitting: Hematology and Oncology

## 2016-08-12 VITALS — BP 125/86 | HR 59 | Temp 98.4°F | Resp 18 | Wt 211.9 lb

## 2016-08-12 VITALS — BP 105/62 | HR 68 | Temp 97.1°F | Resp 18

## 2016-08-12 DIAGNOSIS — I10 Essential (primary) hypertension: Secondary | ICD-10-CM | POA: Insufficient documentation

## 2016-08-12 DIAGNOSIS — K219 Gastro-esophageal reflux disease without esophagitis: Secondary | ICD-10-CM | POA: Diagnosis not present

## 2016-08-12 DIAGNOSIS — Z79899 Other long term (current) drug therapy: Secondary | ICD-10-CM | POA: Diagnosis not present

## 2016-08-12 DIAGNOSIS — G35 Multiple sclerosis: Secondary | ICD-10-CM

## 2016-08-12 DIAGNOSIS — Z7982 Long term (current) use of aspirin: Secondary | ICD-10-CM | POA: Diagnosis not present

## 2016-08-12 DIAGNOSIS — F329 Major depressive disorder, single episode, unspecified: Secondary | ICD-10-CM | POA: Diagnosis not present

## 2016-08-12 MED ORDER — SODIUM CHLORIDE 0.9 % IV SOLN
Freq: Once | INTRAVENOUS | Status: AC
  Administered 2016-08-12: 11:00:00 via INTRAVENOUS
  Filled 2016-08-12: qty 1000

## 2016-08-12 MED ORDER — ACETAMINOPHEN 500 MG PO TABS
1000.0000 mg | ORAL_TABLET | Freq: Once | ORAL | Status: AC
  Administered 2016-08-12: 1000 mg via ORAL
  Filled 2016-08-12: qty 2

## 2016-08-12 MED ORDER — NATALIZUMAB 300 MG/15ML IV CONC
300.0000 mg | Freq: Once | INTRAVENOUS | Status: AC
  Administered 2016-08-12: 300 mg via INTRAVENOUS
  Filled 2016-08-12: qty 15

## 2016-08-12 MED ORDER — SODIUM CHLORIDE 0.9 % IJ SOLN
3.0000 mL | Freq: Once | INTRAMUSCULAR | Status: DC
  Filled 2016-08-12: qty 10

## 2016-08-12 NOTE — Progress Notes (Signed)
[facility]-  Cancer Center  Clinic day:  08/12/2016  Chief Complaint: Rose Clements is a 55 y.o. female with multiple sclerosis who is seen for reassessment and continuation of Tysabri.  HPI:   The patient presented with neurologic symptoms in 2011 when she suddenly developed walking instability. Subsequent MRI scans and CSF analysis confirmed the diagnosis of multiple sclerosis.  She began taking Avonex (interferon beta 1 a).   She developed progressive disease and was switched to Tysabri (natalizumab) in 07/2015.   She has continued to receive Tysabri monthly.  Anti JC virus antibody index is performed every 6 months. Testing was negative on 02/28/2016.  For her painful dysesthesias and muscle spasticity, she was started on gabapentin 300 mg with dose titration.  Current dose is 600 mg TID.  The patient was last seen by Rhett Bannister, NP on 01/13/2016.  At that time, she noted some numbness on the right side as well as chronic weakness. She was using a walker for ambulation.   Follow-up visit with Dr. Denice Bors on 02/28/2716 revealed no acute neurologic symptoms worrisome for relapse.  She had experienced some increased spasticity and walking instability.   Follow-up visit with Horton Chin, PA on 05/12/2016 noted left upper and lower extremity numbness and tingling, left lower extremity spasticity and right upper extremity tremor which is intermittent. She noted that the tremor could occur both at rest and on intention. MRI scan of both brain and cervical spine on 02/28/2016 was stable in comparison to 05/16/2015.  Anti-Tysabri antbodies were ordered.  Consideration was being made to switch to Rituxan.  Plan was for continuation of Tysabri.  She last received Tysabri on 07/10/2016 in the [place] infusion center.  She tolerated her infusion well.  She has a follow-up appointment with her neurologist on 09/01/2016.  Current MS symptoms include muscle spasticity,  dysesthesias in bilateral lower extremities, gait instability, urinary incontinence, and cognitive difficulty (word-finding difficulty).   Past Medical History:  Diagnosis Date  . Constipation   . Cystitis bacillary, chronic   . Depression   . Dysphagia    Dx by Beverely Risen on 10/15/14  . Eosinophilic esophagitis 11/19/2014   GE junction benign stricture s/p balloon dilation, retained food contents. ?gastroparesis versus secondary to EE.  Marland Kitchen Esophageal dysphagia   . GERD (gastroesophageal reflux disease)   . Heartburn   . Hypertension   . Multilevel degenerative disc disease   . Multiple sclerosis (HCC)   . Multiple sclerosis (HCC) 07/16/2015    Past Surgical History:  Procedure Laterality Date  . ABDOMINAL HYSTERECTOMY  2003  . CARPAL TUNNEL RELEASE Right 1997  . CESAREAN SECTION  1989  . CYSTECTOMY  2002   From chest  . ESOPHAGOGASTRODUODENOSCOPY (EGD) WITH PROPOFOL N/A 05/19/2016   Procedure: ESOPHAGOGASTRODUODENOSCOPY (EGD) WITH PROPOFOL;  Surgeon: [name], MD;  Location: [facility] ENDOSCOPY;  Service: Endoscopy;  Laterality: N/A;  . KIDNEY DONATION Left 2002  . OVARIAN CYST REMOVAL Left 2002   Ovarian mass removal  . TONSILLECTOMY  1973  . UPPER GASTROINTESTINAL ENDOSCOPY  11/19/2014   Benign appearing esophageal stricture-Dilated.    Family History  Problem Relation Age of Onset  . Diabetes Mother   . Hypertension Mother   . Heart disease Mother   . Pulmonary embolism Mother   . Diabetes Father   . Diabetes Sister   . Hypertension Sister   . Heart disease Sister   . Bipolar disorder Sister   . Narcolepsy Brother   . Heart  attack Maternal Grandmother   . Pancreatic cancer Maternal Grandfather     Social History:  reports that she has never smoked. She has never used smokeless tobacco. She reports that she does not drink alcohol or use drugs.  The patient is accompanied by her friend, Rose Clements, today.  Allergies: No Known Allergies  Current Medications: Current  Outpatient Prescriptions  Medication Sig Dispense Refill  . aspirin 81 MG tablet Take 81 mg by mouth daily.    . baclofen (LIORESAL) 20 MG tablet Take 20 mg by mouth 4 (four) times daily.    . calcium carbonate (OS-CAL) 600 MG TABS tablet Take 600 mg by mouth 2 (two) times daily with a meal.    . cetirizine (ZYRTEC) 10 MG tablet Take 10 mg by mouth daily.    . Cholecalciferol (VITAMIN D3) 1000 UNITS CAPS Take 2 capsules by mouth daily.     . fluticasone (FLONASE) 50 MCG/ACT nasal spray Place 2 sprays into both nostrils daily.    Marland Kitchen. gabapentin (NEURONTIN) 300 MG capsule Take 300 mg by mouth 3 (three) times daily. Reported on 05/19/2016    . lactobacillus acidophilus (BACID) TABS tablet Take 2 tablets by mouth daily.    . modafinil (PROVIGIL) 200 MG tablet Take 200 mg by mouth 2 (two) times daily.   0  . natalizumab (TYSABRI) 300 MG/15ML injection Inject into the vein.    . pantoprazole (PROTONIX) 40 MG tablet   0  . Polyethylene Glycol POWD Take 17 g by mouth daily. 527 g 3  . sertraline (ZOLOFT) 100 MG tablet Take 1.5 tablets (150 mg total) by mouth every morning. 45 tablet 2   No current facility-administered medications for this visit.     Review of Systems:  GENERAL:  Energy level is fair.  Sweats all the time (wake up soaked).  Feels cold. PERFORMANCE STATUS (ECOG):  2 HEENT:  No visual changes, runny nose, sore throat, mouth sores or tenderness. Lungs: Shortness of breath with talking.  No cough.  No hemoptysis.  PFTs in 09/2016. Cardiac:  No chest pain, palpitations, orthopnea, or PND. GI:  Constipation, uses Miralax.  No nausea, vomiting, diarrhea, melena or hematochezia.  Colonoscopy 2012. GU:  Urinary incontinence.  No urgency, frequency, dysuria, or hematuria. Musculoskeletal:  Spasms on left side of neck.  No back pain.  Arhtritis.  No muscle tenderness. Extremities:  No pain or swelling. Skin:  No rashes or skin changes. Neuro:  Bilateral leg numbness.  Left leg dragging more.   No headache, numbness or weakness, balance or coordination issues. Endocrine:  No diabetes, thyroid issues, hot flashes or night sweats. Psych:  No mood changes, depression or anxiety. Pain:  No focal pain. Review of systems:  All other systems reviewed and found to be negative.  Physical Exam: Blood pressure 125/86, pulse (!) 59, temperature 98.4 F (36.9 C), temperature source Tympanic, resp. rate 18, weight 211 lb 13.8 oz (96.1 kg). GENERAL:  Well developed, well nourished, sitting comfortably in the exam room in no acute distress.  Rolling walker at her side. MENTAL STATUS:  Alert and oriented to person, place and time. HEAD:  Braided hair.  Normocephalic, atraumatic, face symmetric, no Cushingoid features. EYES:  Glasses.  Brown eyes.  Left eyebrow pierced.  Pupils equal round and reactive to light and accomodation.  No conjunctivitis or scleral icterus. ENT:  Oropharynx clear without lesion.  Tongue normal. Mucous membranes moist.  RESPIRATORY:  Clear to auscultation without rales, wheezes or rhonchi. CARDIOVASCULAR:  Regular  rate and rhythm without murmur, rub or gallop. ABDOMEN:  Soft, non-tender, with active bowel sounds, and no hepatosplenomegaly.  No masses. SKIN:  No rashes, ulcers or lesions. EXTREMITIES: No edema, no skin discoloration or tenderness.  No palpable cords. LYMPH NODES: No palpable cervical, supraclavicular, axillary or inguinal adenopathy  NEUROLOGICAL: Alert & oriented, cranial nerves II-XII intact; motor strength 5/5 throughout; sensation intact; Left sided finger to nose dysmetria. RAM normal; left patella reflex 2++.  PSYCH:  Appropriate.  No visits with results within 3 Day(s) from this visit.  Latest known visit with results is:  No results found for any previous visit.    Assessment:  Rose Clements is a 55 y.o. female with multiple sclerosis since 2011.  She presented with walking difficulties.  MRI scans and CSF analysis confirmed the diagnosis of  multiple sclerosis.  She initially received Avonex (interferon beta 1 a).   She developed progressive disease and was switched to Tysabri (natalizumab) in 07/2015.   She has continued to receive Tysabri monthly.  Anti JC virus antibody index is performed every 6 months. Testing was negative on 02/28/2016.  Last report was negative per patient.    Plan: 1.  Review entire medical history, diagnosis and management of MS.  Discuss continuation of Tysabri per her neurologist.  Discuss negative JC virus testing per patient. 2.  Tysabri today. 3.  RTC monthly for Tysabri. 4.  RTC in 6 months for MD assessment and Tysabri.   Rosey Bath, MD  08/12/2016

## 2016-08-12 NOTE — Progress Notes (Signed)
Patient complains of neck pain.  Otherwise no complaints.

## 2016-08-18 ENCOUNTER — Ambulatory Visit (INDEPENDENT_AMBULATORY_CARE_PROVIDER_SITE_OTHER): Payer: Medicare Other | Admitting: Psychiatry

## 2016-08-18 ENCOUNTER — Encounter: Payer: Self-pay | Admitting: Psychiatry

## 2016-08-18 VITALS — BP 125/79 | HR 70 | Temp 98.6°F | Ht 65.0 in | Wt 215.2 lb

## 2016-08-18 DIAGNOSIS — F411 Generalized anxiety disorder: Secondary | ICD-10-CM

## 2016-08-18 DIAGNOSIS — F331 Major depressive disorder, recurrent, moderate: Secondary | ICD-10-CM

## 2016-08-18 MED ORDER — SERTRALINE HCL 100 MG PO TABS: 150.0000 mg | ORAL_TABLET | ORAL | 2 refills | Status: DC

## 2016-08-18 NOTE — Progress Notes (Signed)
Patient ID: Rose Clements, female   DOB: 1961-04-10, 55 y.o.   MRN: [id number] [facility] MD/PA/NP OP Progress Note  08/18/2016 11:08 AM Rose Clements  MRN:  [id number]  Subjective:  Patient returns for follow-up of her major depressive disorder, moderate, recurrent.  Patient reports she is doing okay. The increase in the sertraline has been helpful for her mood. Her Modafanil was increased to 400mg  daily by her sleep specialist. This was increased because she was sleeping too much. She has stopped taking the Ambien. She is able to do her own activities at home to an extent. Her husband cooks for her during the week and works  As a [employer]. . She sees Felecia Jan for therapy and is working on some strategies to cope with her anxieties.   Chief Complaint:  Doing well Chief Complaint    Follow-up; Medication Refill     Visit Diagnosis:     ICD-9-CM ICD-10-CM   1. Major depressive disorder, recurrent episode, moderate (HCC) 296.32 F33.1   2. Anxiety state 300.00 F41.1     Past Medical History:  Past Medical History:  Diagnosis Date  . Constipation   . Cystitis bacillary, chronic   . Depression   . Dysphagia    Dx by Beverely Risen on 10/15/14  . Eosinophilic esophagitis 11/19/2014   GE junction benign stricture s/p balloon dilation, retained food contents. ?gastroparesis versus secondary to EE.  Marland Kitchen Esophageal dysphagia   . GERD (gastroesophageal reflux disease)   . Heartburn   . Hypertension   . Multilevel degenerative disc disease   . Multiple sclerosis (HCC)   . Multiple sclerosis (HCC) 07/16/2015    Past Surgical History:  Procedure Laterality Date  . ABDOMINAL HYSTERECTOMY  2003  . CARPAL TUNNEL RELEASE Right 1997  . CESAREAN SECTION  1989  . CYSTECTOMY  2002   From chest  . ESOPHAGOGASTRODUODENOSCOPY (EGD) WITH PROPOFOL N/A 05/19/2016   Procedure: ESOPHAGOGASTRODUODENOSCOPY (EGD) WITH PROPOFOL;  Surgeon: [name], MD;  Location: [facility] ENDOSCOPY;  Service: Endoscopy;  Laterality:  N/A;  . KIDNEY DONATION Left 2002  . OVARIAN CYST REMOVAL Left 2002   Ovarian mass removal  . TONSILLECTOMY  1973  . UPPER GASTROINTESTINAL ENDOSCOPY  11/19/2014   Benign appearing esophageal stricture-Dilated.   Family History:  Family History  Problem Relation Age of Onset  . Diabetes Mother   . Hypertension Mother   . Heart disease Mother   . Pulmonary embolism Mother   . Diabetes Father   . Diabetes Sister   . Hypertension Sister   . Heart disease Sister   . Bipolar disorder Sister   . Narcolepsy Brother   . Heart attack Maternal Grandmother   . Pancreatic cancer Maternal Grandfather    Social History:  Social History   Social History  . Marital status: Married    Spouse name: N/A  . Number of children: N/A  . Years of education: N/A   Social History Main Topics  . Smoking status: Never Smoker  . Smokeless tobacco: Never Used  . Alcohol use No  . Drug use: No  . Sexual activity: Not Currently   Other Topics Concern  . None   Social History Narrative  . None   Additional History:   Assessment:   Musculoskeletal: Strength & Muscle Tone: Patient ambulates slowly with a cane. Gait & Station: Slow and with cane Patient leans: N/A  Psychiatric Specialty Exam: Anxiety  Patient reports no insomnia, nervous/anxious behavior or suicidal ideas.    Depression  Associated symptoms include does not have insomnia and no suicidal ideas.  Past medical history includes anxiety.   Medication Refill     Review of Systems  Psychiatric/Behavioral: Negative for depression, hallucinations, memory loss, substance abuse and suicidal ideas. The patient is not nervous/anxious and does not have insomnia.   All other systems reviewed and are negative.   Blood pressure 125/79, pulse 70, temperature 98.6 F (37 C), temperature source Oral, height 5\' 5"  (1.651 m), weight 215 lb 3.2 oz (97.6 kg).Body mass index is 35.81 kg/m.  General Appearance: Neat and Well Groomed   Eye Contact:  Good  Speech:  Clear and Coherent  Volume:  Normal  Mood:  Good  Affect:  Congruent  Thought Process:  Linear and Logical  Orientation:  Full (Time, Place, and Person)  Thought Content:  Negative  Suicidal Thoughts:  No  Homicidal Thoughts:  No  Memory:  Immediate;   Good Recent;   Good Remote;   Good  Judgement:  Good  Insight:  Good  Psychomotor Activity:  Negative  Concentration:  Good  Recall:  Good  Fund of Knowledge: Good  Language: Good  Akathisia:  Negative  Handed:  Right unknown   AIMS (if indicated):  N/A  Assets:  Communication Skills Desire for Improvement Social Support  ADL's:  Intact  Cognition: WNL  Sleep:  fair   Is the patient at risk to self?  No. Has the patient been a risk to self in the past 6 months?  No. Has the patient been a risk to self within the distant past?  No. Is the patient a risk to others?  No. Has the patient been a risk to others in the past 6 months?  No. Has the patient been a risk to others within the distant past?  No.  Current Medications: Current Outpatient Prescriptions  Medication Sig Dispense Refill  . aspirin 81 MG tablet Take 81 mg by mouth daily.    . baclofen (LIORESAL) 20 MG tablet Take 20 mg by mouth 4 (four) times daily.    . calcium carbonate (OS-CAL) 600 MG TABS tablet Take 600 mg by mouth 2 (two) times daily with a meal.    . cetirizine (ZYRTEC) 10 MG tablet Take 10 mg by mouth daily.    . Cholecalciferol (VITAMIN D3) 1000 UNITS CAPS Take 2 capsules by mouth daily.     . fluticasone (FLONASE) 50 MCG/ACT nasal spray Place 2 sprays into both nostrils daily.    Marland Kitchen gabapentin (NEURONTIN) 300 MG capsule Take 300 mg by mouth 3 (three) times daily. Reported on 05/19/2016    . lactobacillus acidophilus (BACID) TABS tablet Take 2 tablets by mouth daily.    . modafinil (PROVIGIL) 200 MG tablet Take 200 mg by mouth 2 (two) times daily.   0  . natalizumab (TYSABRI) 300 MG/15ML injection Inject into the vein.     . pantoprazole (PROTONIX) 40 MG tablet   0  . Polyethylene Glycol POWD Take 17 g by mouth daily. 527 g 3  . sertraline (ZOLOFT) 100 MG tablet Take 1.5 tablets (150 mg total) by mouth every morning. 45 tablet 2   No current facility-administered medications for this visit.     Medical Decision Making:  Established Problem, Stable/Improving (1), Review of Medication Regimen & Side Effects (2) and Review of New Medication or Change in Dosage (2)  Treatment Plan Summary:Medication management and Plan   Major depressive disorder, recurrent, moderate- patient's mood is stable. Continue sertraline to150  mg daily.   She continues to see Felecia Janina thompson for therapy.  Anxiety-Same as above.  RTC in 3 months or call before if necessary.  Marland Kitchen.Lawrence Roldan 08/18/2016, 11:08 AM

## 2016-09-05 ENCOUNTER — Encounter: Payer: Self-pay | Admitting: Hematology and Oncology

## 2016-09-09 ENCOUNTER — Inpatient Hospital Stay: Payer: 59

## 2016-09-11 ENCOUNTER — Inpatient Hospital Stay: Payer: 59

## 2016-09-18 ENCOUNTER — Inpatient Hospital Stay: Payer: Medicare Other | Attending: Family Medicine

## 2016-09-18 VITALS — BP 109/70 | HR 73 | Temp 98.0°F | Resp 16

## 2016-09-18 DIAGNOSIS — G35 Multiple sclerosis: Secondary | ICD-10-CM | POA: Diagnosis present

## 2016-09-18 DIAGNOSIS — Z79899 Other long term (current) drug therapy: Secondary | ICD-10-CM | POA: Insufficient documentation

## 2016-09-18 MED ORDER — ACETAMINOPHEN 500 MG PO TABS
1000.0000 mg | ORAL_TABLET | Freq: Once | ORAL | Status: AC
  Administered 2016-09-18: 1000 mg via ORAL
  Filled 2016-09-18: qty 2

## 2016-09-18 MED ORDER — SODIUM CHLORIDE 0.9 % IV SOLN
300.0000 mg | Freq: Once | INTRAVENOUS | Status: AC
  Administered 2016-09-18: 300 mg via INTRAVENOUS
  Filled 2016-09-18: qty 15

## 2016-09-18 MED ORDER — SODIUM CHLORIDE 0.9 % IV SOLN
Freq: Once | INTRAVENOUS | Status: AC
  Administered 2016-09-18: 11:00:00 via INTRAVENOUS
  Filled 2016-09-18: qty 1000

## 2016-10-09 ENCOUNTER — Inpatient Hospital Stay: Payer: Medicare Other

## 2016-10-13 ENCOUNTER — Ambulatory Visit: Payer: Self-pay

## 2016-10-21 ENCOUNTER — Inpatient Hospital Stay: Payer: Medicare Other | Attending: Family Medicine

## 2016-10-21 VITALS — BP 123/78 | HR 62 | Temp 98.9°F | Resp 16

## 2016-10-21 DIAGNOSIS — Z79899 Other long term (current) drug therapy: Secondary | ICD-10-CM | POA: Insufficient documentation

## 2016-10-21 DIAGNOSIS — G35 Multiple sclerosis: Secondary | ICD-10-CM | POA: Insufficient documentation

## 2016-10-21 MED ORDER — SODIUM CHLORIDE 0.9 % IV SOLN
Freq: Once | INTRAVENOUS | Status: AC
  Administered 2016-10-21: 11:00:00 via INTRAVENOUS
  Filled 2016-10-21: qty 1000

## 2016-10-21 MED ORDER — ACETAMINOPHEN 500 MG PO TABS
1000.0000 mg | ORAL_TABLET | Freq: Once | ORAL | Status: AC
  Administered 2016-10-21: 1000 mg via ORAL
  Filled 2016-10-21: qty 2

## 2016-10-21 MED ORDER — SODIUM CHLORIDE 0.9 % IV SOLN
300.0000 mg | Freq: Once | INTRAVENOUS | Status: AC
  Administered 2016-10-21: 300 mg via INTRAVENOUS
  Filled 2016-10-21: qty 15

## 2016-11-06 ENCOUNTER — Inpatient Hospital Stay: Payer: Medicare Other

## 2016-11-10 ENCOUNTER — Ambulatory Visit: Payer: Self-pay

## 2016-11-17 ENCOUNTER — Encounter: Payer: Self-pay | Admitting: Psychiatry

## 2016-11-17 ENCOUNTER — Ambulatory Visit (INDEPENDENT_AMBULATORY_CARE_PROVIDER_SITE_OTHER): Payer: No Typology Code available for payment source | Admitting: Psychiatry

## 2016-11-17 VITALS — BP 143/88 | HR 73 | Temp 98.4°F | Wt 225.6 lb

## 2016-11-17 DIAGNOSIS — F411 Generalized anxiety disorder: Secondary | ICD-10-CM | POA: Diagnosis not present

## 2016-11-17 DIAGNOSIS — F331 Major depressive disorder, recurrent, moderate: Secondary | ICD-10-CM | POA: Diagnosis not present

## 2016-11-17 MED ORDER — DULOXETINE HCL 40 MG PO CPEP: 40.0000 mg | ORAL_CAPSULE | Freq: Every day | ORAL | 1 refills | Status: DC

## 2016-11-17 NOTE — Progress Notes (Signed)
Patient ID: Rose Clements, female   DOB: 1961/03/24, 55 y.o.   MRN: [id number] [facility] MD/PA/NP OP Progress Note  11/17/2016 10:38 AM Rose Clements  MRN:  [id number]  Subjective:  Patient returns for follow-up of her major depressive disorder, moderate, recurrent. Reports feeling depressed, states her Neurologist took her off the sertraline and started cymbalta at 20mg  to help with pain. Patient reports feeling more depressed since the switch. She has been eating more and has gained 14 lbs. Denies any suicidal thoughts.She sees Felecia Jan for therapy and is working on some strategies to cope with her anxieties.   Chief Complaint:  Depressed Chief Complaint    Follow-up; Medication Refill     Visit Diagnosis:     ICD-9-CM ICD-10-CM   1. Major depressive disorder, recurrent episode, moderate (HCC) 296.32 F33.1   2. Anxiety state 300.00 F41.1     Past Medical History:  Past Medical History:  Diagnosis Date  . Constipation   . Cystitis bacillary, chronic   . Depression   . Dysphagia    Dx by Beverely Risen on 10/15/14  . Eosinophilic esophagitis 11/19/2014   GE junction benign stricture s/p balloon dilation, retained food contents. ?gastroparesis versus secondary to EE.  Marland Kitchen Esophageal dysphagia   . GERD (gastroesophageal reflux disease)   . Heartburn   . Hypertension   . Multilevel degenerative disc disease   . Multiple sclerosis (HCC)   . Multiple sclerosis (HCC) 07/16/2015    Past Surgical History:  Procedure Laterality Date  . ABDOMINAL HYSTERECTOMY  2003  . CARPAL TUNNEL RELEASE Right 1997  . CESAREAN SECTION  1989  . CYSTECTOMY  2002   From chest  . ESOPHAGOGASTRODUODENOSCOPY (EGD) WITH PROPOFOL N/A 05/19/2016   Procedure: ESOPHAGOGASTRODUODENOSCOPY (EGD) WITH PROPOFOL;  Surgeon: [name], MD;  Location: [facility] ENDOSCOPY;  Service: Endoscopy;  Laterality: N/A;  . KIDNEY DONATION Left 2002  . OVARIAN CYST REMOVAL Left 2002   Ovarian mass removal  . TONSILLECTOMY  1973  . UPPER  GASTROINTESTINAL ENDOSCOPY  11/19/2014   Benign appearing esophageal stricture-Dilated.   Family History:  Family History  Problem Relation Age of Onset  . Diabetes Mother   . Hypertension Mother   . Heart disease Mother   . Pulmonary embolism Mother   . Diabetes Father   . Diabetes Sister   . Hypertension Sister   . Heart disease Sister   . Bipolar disorder Sister   . Narcolepsy Brother   . Heart attack Maternal Grandmother   . Pancreatic cancer Maternal Grandfather    Social History:  Social History   Social History  . Marital status: Married    Spouse name: N/A  . Number of children: N/A  . Years of education: N/A   Social History Main Topics  . Smoking status: Never Smoker  . Smokeless tobacco: Never Used  . Alcohol use No  . Drug use: No  . Sexual activity: Not Currently   Other Topics Concern  . None   Social History Narrative  . None   Additional History:   Assessment:   Musculoskeletal: Strength & Muscle Tone: Patient ambulates slowly with a cane. Gait & Station: Slow and with cane Patient leans: N/A  Psychiatric Specialty Exam: Medication Refill   Anxiety  Patient reports no insomnia, nervous/anxious behavior or suicidal ideas.    Depression         Associated symptoms include does not have insomnia and no suicidal ideas.  Past medical history includes anxiety.  Review of Systems  Psychiatric/Behavioral: Negative for depression, hallucinations, memory loss, substance abuse and suicidal ideas. The patient is not nervous/anxious and does not have insomnia.   All other systems reviewed and are negative.   Blood pressure (!) 143/88, pulse 73, temperature 98.4 F (36.9 C), temperature source Oral, weight 225 lb 9.6 oz (102.3 kg).Body mass index is 37.54 kg/m.  General Appearance: Neat and Well Groomed  Eye Contact:  Good  Speech:  Clear and Coherent  Volume:  Normal  Mood:  depressed  Affect:  Congruent  Thought Process:  Linear and  Logical  Orientation:  Full (Time, Place, and Person)  Thought Content:  Negative  Suicidal Thoughts:  No  Homicidal Thoughts:  No  Memory:  Immediate;   Good Recent;   Good Remote;   Good  Judgement:  Good  Insight:  Good  Psychomotor Activity:  Negative  Concentration:  Good  Recall:  Good  Fund of Knowledge: Good  Language: Good  Akathisia:  Negative  Handed:  Right unknown   AIMS (if indicated):  N/A  Assets:  Communication Skills Desire for Improvement Social Support  ADL's:  Intact  Cognition: WNL  Sleep:  fair   Is the patient at risk to self?  No. Has the patient been a risk to self in the past 6 months?  No. Has the patient been a risk to self within the distant past?  No. Is the patient a risk to others?  No. Has the patient been a risk to others in the past 6 months?  No. Has the patient been a risk to others within the distant past?  No.  Current Medications: Current Outpatient Prescriptions  Medication Sig Dispense Refill  . aspirin 81 MG tablet Take 81 mg by mouth daily.    . baclofen (LIORESAL) 20 MG tablet Take 20 mg by mouth 4 (four) times daily.    . calcium carbonate (OS-CAL) 600 MG TABS tablet Take 600 mg by mouth 2 (two) times daily with a meal.    . cetirizine (ZYRTEC) 10 MG tablet Take 10 mg by mouth daily.    . Cholecalciferol (VITAMIN D3) 1000 UNITS CAPS Take 2 capsules by mouth daily.     . DULoxetine (CYMBALTA) 20 MG capsule Take by mouth.    . fluticasone (FLONASE) 50 MCG/ACT nasal spray Place 2 sprays into both nostrils daily.    Marland Kitchen lactobacillus acidophilus (BACID) TABS tablet Take 2 tablets by mouth daily.    . modafinil (PROVIGIL) 200 MG tablet Take 200 mg by mouth 2 (two) times daily.   0  . natalizumab (TYSABRI) 300 MG/15ML injection Inject into the vein.    . pantoprazole (PROTONIX) 40 MG tablet   0  . Polyethylene Glycol POWD Take 17 g by mouth daily. 527 g 3  . zolpidem (AMBIEN) 5 MG tablet   0  . gabapentin (NEURONTIN) 300 MG  capsule Take 300 mg by mouth 3 (three) times daily. Reported on 05/19/2016     No current facility-administered medications for this visit.     Medical Decision Making:  Established Problem, Stable/Improving (1), Review of Medication Regimen & Side Effects (2) and Review of New Medication or Change in Dosage (2)  Treatment Plan Summary:Medication management and Plan   Major depressive disorder, recurrent, moderate- patient's mood is stable.   Increase cymbalta to 40mg  daily. She continues to see Felecia Jan for therapy.  Anxiety-Same as above.  RTC in 2 months or call before if necessary.  Marland Kitchen  Rose Clements 11/17/2016, 10:38 AM

## 2016-11-18 ENCOUNTER — Inpatient Hospital Stay: Payer: No Typology Code available for payment source | Attending: Family Medicine

## 2016-11-18 VITALS — BP 143/83 | HR 65 | Resp 18

## 2016-11-18 DIAGNOSIS — Z79899 Other long term (current) drug therapy: Secondary | ICD-10-CM | POA: Diagnosis not present

## 2016-11-18 DIAGNOSIS — G35 Multiple sclerosis: Secondary | ICD-10-CM | POA: Diagnosis not present

## 2016-11-18 MED ORDER — SODIUM CHLORIDE 0.9 % IV SOLN
300.0000 mg | Freq: Once | INTRAVENOUS | Status: AC
  Administered 2016-11-18: 300 mg via INTRAVENOUS
  Filled 2016-11-18: qty 15

## 2016-11-18 MED ORDER — SODIUM CHLORIDE 0.9 % IV SOLN
Freq: Once | INTRAVENOUS | Status: AC
  Administered 2016-11-18: 11:00:00 via INTRAVENOUS
  Filled 2016-11-18: qty 1000

## 2016-11-18 MED ORDER — ACETAMINOPHEN 500 MG PO TABS
1000.0000 mg | ORAL_TABLET | Freq: Once | ORAL | Status: AC
  Administered 2016-11-18: 1000 mg via ORAL
  Filled 2016-11-18: qty 2

## 2016-12-04 ENCOUNTER — Inpatient Hospital Stay: Payer: Medicare Other

## 2016-12-08 ENCOUNTER — Ambulatory Visit: Payer: Self-pay

## 2016-12-16 ENCOUNTER — Inpatient Hospital Stay: Payer: No Typology Code available for payment source

## 2017-01-01 ENCOUNTER — Inpatient Hospital Stay: Payer: Medicare Other

## 2017-01-04 DIAGNOSIS — R2681 Unsteadiness on feet: Secondary | ICD-10-CM | POA: Diagnosis not present

## 2017-01-04 DIAGNOSIS — Z79899 Other long term (current) drug therapy: Secondary | ICD-10-CM | POA: Diagnosis not present

## 2017-01-04 DIAGNOSIS — M62838 Other muscle spasm: Secondary | ICD-10-CM | POA: Diagnosis not present

## 2017-01-04 DIAGNOSIS — G35 Multiple sclerosis: Secondary | ICD-10-CM | POA: Diagnosis not present

## 2017-01-05 ENCOUNTER — Ambulatory Visit: Payer: Self-pay

## 2017-01-05 DIAGNOSIS — F331 Major depressive disorder, recurrent, moderate: Secondary | ICD-10-CM | POA: Diagnosis not present

## 2017-01-06 ENCOUNTER — Inpatient Hospital Stay: Payer: PRIVATE HEALTH INSURANCE | Attending: Family Medicine

## 2017-01-06 VITALS — BP 120/80 | HR 82 | Temp 97.8°F | Resp 18

## 2017-01-06 DIAGNOSIS — G35 Multiple sclerosis: Secondary | ICD-10-CM | POA: Diagnosis not present

## 2017-01-06 DIAGNOSIS — Z79899 Other long term (current) drug therapy: Secondary | ICD-10-CM | POA: Diagnosis not present

## 2017-01-06 MED ORDER — ACETAMINOPHEN 500 MG PO TABS
1000.0000 mg | ORAL_TABLET | Freq: Once | ORAL | Status: AC
  Administered 2017-01-06: 1000 mg via ORAL
  Filled 2017-01-06: qty 2

## 2017-01-06 MED ORDER — SODIUM CHLORIDE 0.9 % IV SOLN
300.0000 mg | Freq: Once | INTRAVENOUS | Status: AC
  Administered 2017-01-06: 300 mg via INTRAVENOUS
  Filled 2017-01-06: qty 15

## 2017-01-06 MED ORDER — SODIUM CHLORIDE 0.9 % IV SOLN
Freq: Once | INTRAVENOUS | Status: AC
  Administered 2017-01-06: 10:00:00 via INTRAVENOUS
  Filled 2017-01-06: qty 1000

## 2017-01-06 NOTE — Patient Instructions (Signed)
Patient instructed to notify her neurologist for any concerns, questions or side effects from her Tysabri. Infusion today.

## 2017-01-13 ENCOUNTER — Inpatient Hospital Stay: Payer: No Typology Code available for payment source

## 2017-01-19 ENCOUNTER — Ambulatory Visit (INDEPENDENT_AMBULATORY_CARE_PROVIDER_SITE_OTHER): Payer: No Typology Code available for payment source | Admitting: Psychiatry

## 2017-01-19 ENCOUNTER — Encounter: Payer: Self-pay | Admitting: Psychiatry

## 2017-01-19 VITALS — BP 124/83 | HR 67 | Temp 97.6°F | Wt 232.2 lb

## 2017-01-19 DIAGNOSIS — F331 Major depressive disorder, recurrent, moderate: Secondary | ICD-10-CM | POA: Diagnosis not present

## 2017-01-19 DIAGNOSIS — F411 Generalized anxiety disorder: Secondary | ICD-10-CM | POA: Diagnosis not present

## 2017-01-19 NOTE — Progress Notes (Signed)
Patient ID: Rose Clements, female   DOB: August 16, 1961, 56 y.o.   MRN: [id number] [facility] MD/PA/NP OP Progress Note  01/19/2017 10:43 AM Cherisa Brucker  MRN:  [id number]  Subjective:  Patient returns for follow-up of her major depressive disorder, moderate, recurrent. Patient today reports that she continues to feel depressed and overwhelmed. She recently came back from her daughter's home in [place]. Reports that it has been stressful visit since her son-in-law had some serious cardiac problems. States that her neurologist has increased her Cymbalta to 60 mg and she is been on this higher dose for a week. She has not been able to tell any difference. But she does agree that overall his stress levels have better since she is back home. She does agree that she has taken on more than she can and she needs to set some limits and boundaries. She denies any suicidal thoughts.  Chief Complaint:  Depressed  Visit Diagnosis:     ICD-9-CM ICD-10-CM   1. Major depressive disorder, recurrent episode, moderate (HCC) 296.32 F33.1   2. Anxiety state 300.00 F41.1     Past Medical History:  Past Medical History:  Diagnosis Date  . Constipation   . Cystitis bacillary, chronic   . Depression   . Dysphagia    Dx by Beverely Risen on 10/15/14  . Eosinophilic esophagitis 11/19/2014   GE junction benign stricture s/p balloon dilation, retained food contents. ?gastroparesis versus secondary to EE.  Marland Kitchen Esophageal dysphagia   . GERD (gastroesophageal reflux disease)   . Heartburn   . Hypertension   . Multilevel degenerative disc disease   . Multiple sclerosis (HCC)   . Multiple sclerosis (HCC) 07/16/2015    Past Surgical History:  Procedure Laterality Date  . ABDOMINAL HYSTERECTOMY  2003  . CARPAL TUNNEL RELEASE Right 1997  . CESAREAN SECTION  1989  . CYSTECTOMY  2002   From chest  . ESOPHAGOGASTRODUODENOSCOPY (EGD) WITH PROPOFOL N/A 05/19/2016   Procedure: ESOPHAGOGASTRODUODENOSCOPY (EGD) WITH PROPOFOL;  Surgeon: [name], MD;  Location: [facility] ENDOSCOPY;  Service: Endoscopy;  Laterality: N/A;  . KIDNEY DONATION Left 2002  . OVARIAN CYST REMOVAL Left 2002   Ovarian mass removal  . TONSILLECTOMY  1973  . UPPER GASTROINTESTINAL ENDOSCOPY  11/19/2014   Benign appearing esophageal stricture-Dilated.   Family History:  Family History  Problem Relation Age of Onset  . Diabetes Mother   . Hypertension Mother   . Heart disease Mother   . Pulmonary embolism Mother   . Diabetes Father   . Diabetes Sister   . Hypertension Sister   . Heart disease Sister   . Bipolar disorder Sister   . Narcolepsy Brother   . Heart attack Maternal Grandmother   . Pancreatic cancer Maternal Grandfather    Social History:  Social History   Social History  . Marital status: Married    Spouse name: N/A  . Number of children: N/A  . Years of education: N/A   Social History Main Topics  . Smoking status: Never Smoker  . Smokeless tobacco: Never Used  . Alcohol use No  . Drug use: No  . Sexual activity: Not Currently   Other Topics Concern  . Not on file   Social History Narrative  . No narrative on file   Additional History:   Assessment:   Musculoskeletal: Strength & Muscle Tone: Patient ambulates slowly with a cane. Gait & Station: Slow and with cane Patient leans: N/A  Psychiatric Specialty Exam: Medication Refill  Anxiety  Patient reports no insomnia, nervous/anxious behavior or suicidal ideas.    Depression         Associated symptoms include does not have insomnia and no suicidal ideas.  Past medical history includes anxiety.     Review of Systems  Psychiatric/Behavioral: Negative for depression, hallucinations, memory loss, substance abuse and suicidal ideas. The patient is not nervous/anxious and does not have insomnia.   All other systems reviewed and are negative.   There were no vitals taken for this visit.There is no height or weight on file to calculate BMI.  General Appearance: Neat  and Well Groomed  Eye Contact:  Good  Speech:  Clear and Coherent  Volume:  Normal  Mood:  depressed  Affect:  Congruent  Thought Process:  Linear and Logical  Orientation:  Full (Time, Place, and Person)  Thought Content:  Negative  Suicidal Thoughts:  No  Homicidal Thoughts:  No  Memory:  Immediate;   Good Recent;   Good Remote;   Good  Judgement:  Good  Insight:  Good  Psychomotor Activity:  Negative  Concentration:  Good  Recall:  Good  Fund of Knowledge: Good  Language: Good  Akathisia:  Negative  Handed:  Right unknown   AIMS (if indicated):  N/A  Assets:  Communication Skills Desire for Improvement Social Support  ADL's:  Intact  Cognition: WNL  Sleep:  fair   Is the patient at risk to self?  No. Has the patient been a risk to self in the past 6 months?  No. Has the patient been a risk to self within the distant past?  No. Is the patient a risk to others?  No. Has the patient been a risk to others in the past 6 months?  No. Has the patient been a risk to others within the distant past?  No.  Current Medications: Current Outpatient Prescriptions  Medication Sig Dispense Refill  . aspirin 81 MG tablet Take 81 mg by mouth daily.    . baclofen (LIORESAL) 20 MG tablet Take 20 mg by mouth 4 (four) times daily.    . calcium carbonate (OS-CAL) 600 MG TABS tablet Take 600 mg by mouth 2 (two) times daily with a meal.    . cetirizine (ZYRTEC) 10 MG tablet Take 10 mg by mouth daily.    . Cholecalciferol (VITAMIN D3) 1000 UNITS CAPS Take 2 capsules by mouth daily.     . DULoxetine 40 MG CPEP Take 40 mg by mouth daily. 30 capsule 1  . fluticasone (FLONASE) 50 MCG/ACT nasal spray Place 2 sprays into both nostrils daily.    Marland Kitchen gabapentin (NEURONTIN) 300 MG capsule Take 300 mg by mouth 3 (three) times daily. Reported on 05/19/2016    . lactobacillus acidophilus (BACID) TABS tablet Take 2 tablets by mouth daily.    . modafinil (PROVIGIL) 200 MG tablet Take 200 mg by mouth 2 (two)  times daily.   0  . natalizumab (TYSABRI) 300 MG/15ML injection Inject into the vein.    . pantoprazole (PROTONIX) 40 MG tablet   0  . Polyethylene Glycol POWD Take 17 g by mouth daily. 527 g 3  . zolpidem (AMBIEN) 5 MG tablet   0   No current facility-administered medications for this visit.     Medical Decision Making:  Established Problem, Stable/Improving (1), Review of Medication Regimen & Side Effects (2) and Review of New Medication or Change in Dosage (2)  Treatment Plan Summary:Medication management and Plan  Major depressive disorder, recurrent, moderate  Continue cymbalta at 60mg  daily. Patient recommended to have a good diet and take care of herself. Patient recommended to follow-up with the massage therapy or physical therapy as recommended by her neurologist She continues to see Felecia Jan for therapy.  Anxiety-Same as above.  RTC in 2 months or call before if necessary.  Marland KitchenRAVI, Tami Blass 01/19/2017, 10:43 AM

## 2017-01-29 ENCOUNTER — Inpatient Hospital Stay: Payer: Medicare Other

## 2017-01-29 DIAGNOSIS — F331 Major depressive disorder, recurrent, moderate: Secondary | ICD-10-CM | POA: Diagnosis not present

## 2017-02-02 ENCOUNTER — Ambulatory Visit: Payer: Self-pay

## 2017-02-03 ENCOUNTER — Inpatient Hospital Stay: Payer: PRIVATE HEALTH INSURANCE | Attending: Family Medicine

## 2017-02-03 VITALS — BP 124/76 | HR 78 | Temp 98.5°F | Resp 18

## 2017-02-03 DIAGNOSIS — Z79899 Other long term (current) drug therapy: Secondary | ICD-10-CM | POA: Diagnosis not present

## 2017-02-03 DIAGNOSIS — G35 Multiple sclerosis: Secondary | ICD-10-CM | POA: Insufficient documentation

## 2017-02-03 MED ORDER — SODIUM CHLORIDE 0.9 % IV SOLN
300.0000 mg | Freq: Once | INTRAVENOUS | Status: AC
  Administered 2017-02-03: 300 mg via INTRAVENOUS
  Filled 2017-02-03: qty 15

## 2017-02-03 MED ORDER — ACETAMINOPHEN 500 MG PO TABS
1000.0000 mg | ORAL_TABLET | Freq: Once | ORAL | Status: AC
  Administered 2017-02-03: 1000 mg via ORAL
  Filled 2017-02-03: qty 2

## 2017-02-03 MED ORDER — SODIUM CHLORIDE 0.9 % IV SOLN
Freq: Once | INTRAVENOUS | Status: AC
  Administered 2017-02-03: 10:00:00 via INTRAVENOUS
  Filled 2017-02-03: qty 1000

## 2017-02-10 ENCOUNTER — Inpatient Hospital Stay: Payer: No Typology Code available for payment source

## 2017-02-19 DIAGNOSIS — F331 Major depressive disorder, recurrent, moderate: Secondary | ICD-10-CM | POA: Diagnosis not present

## 2017-02-22 ENCOUNTER — Ambulatory Visit (INDEPENDENT_AMBULATORY_CARE_PROVIDER_SITE_OTHER): Payer: No Typology Code available for payment source | Admitting: Psychiatry

## 2017-02-22 ENCOUNTER — Encounter: Payer: Self-pay | Admitting: Psychiatry

## 2017-02-22 VITALS — BP 124/83 | HR 82 | Temp 98.2°F | Wt 232.8 lb

## 2017-02-22 DIAGNOSIS — F331 Major depressive disorder, recurrent, moderate: Secondary | ICD-10-CM | POA: Diagnosis not present

## 2017-02-22 DIAGNOSIS — F411 Generalized anxiety disorder: Secondary | ICD-10-CM

## 2017-02-22 MED ORDER — DULOXETINE HCL 60 MG PO CPEP: 60.0000 mg | ORAL_CAPSULE | Freq: Every day | ORAL | 1 refills | Status: DC

## 2017-02-22 NOTE — Progress Notes (Signed)
Patient ID: Rose Clements, female   DOB: August 22, 1961, 56 y.o.   MRN: [id number] [facility] MD/PA/NP OP Progress Note  02/22/2017 11:48 AM Rose Clements  MRN:  [id number]  Subjective:  Patient returns for follow-up of her major depressive disorder, moderate, recurrent. Patient reports that she might be a little better than last time. She recently had an episode at [facility] that upset her. Patient reports she is not sleeping well but states she has not been taking her medication. States that she has trouble taking medication for sleep. Otherwise doing okay and has an appointment with a neurologist next week for her MS. Denies any suicidal thoughts.  Chief Complaint:  Difficulty sleeping Chief Complaint    Follow-up; Medication Refill     Visit Diagnosis:     ICD-9-CM ICD-10-CM   1. Major depressive disorder, recurrent episode, moderate (HCC) 296.32 F33.1   2. Anxiety state 300.00 F41.1     Past Medical History:  Past Medical History:  Diagnosis Date  . Constipation   . Cystitis bacillary, chronic   . Depression   . Dysphagia    Dx by Beverely Risen on 10/15/14  . Eosinophilic esophagitis 11/19/2014   GE junction benign stricture s/p balloon dilation, retained food contents. ?gastroparesis versus secondary to EE.  Marland Kitchen Esophageal dysphagia   . GERD (gastroesophageal reflux disease)   . Heartburn   . Hypertension   . Multilevel degenerative disc disease   . Multiple sclerosis (HCC)   . Multiple sclerosis (HCC) 07/16/2015    Past Surgical History:  Procedure Laterality Date  . ABDOMINAL HYSTERECTOMY  2003  . CARPAL TUNNEL RELEASE Right 1997  . CESAREAN SECTION  1989  . CYSTECTOMY  2002   From chest  . ESOPHAGOGASTRODUODENOSCOPY (EGD) WITH PROPOFOL N/A 05/19/2016   Procedure: ESOPHAGOGASTRODUODENOSCOPY (EGD) WITH PROPOFOL;  Surgeon: [name], MD;  Location: [facility] ENDOSCOPY;  Service: Endoscopy;  Laterality: N/A;  . KIDNEY DONATION Left 2002  . OVARIAN CYST REMOVAL Left 2002   Ovarian mass removal   . TONSILLECTOMY  1973  . UPPER GASTROINTESTINAL ENDOSCOPY  11/19/2014   Benign appearing esophageal stricture-Dilated.   Family History:  Family History  Problem Relation Age of Onset  . Diabetes Mother   . Hypertension Mother   . Heart disease Mother   . Pulmonary embolism Mother   . Diabetes Father   . Diabetes Sister   . Hypertension Sister   . Heart disease Sister   . Bipolar disorder Sister   . Narcolepsy Brother   . Heart attack Maternal Grandmother   . Pancreatic cancer Maternal Grandfather    Social History:  Social History   Social History  . Marital status: Married    Spouse name: N/A  . Number of children: N/A  . Years of education: N/A   Social History Main Topics  . Smoking status: Never Smoker  . Smokeless tobacco: Never Used  . Alcohol use No  . Drug use: No  . Sexual activity: Not Currently   Other Topics Concern  . None   Social History Narrative  . None   Additional History:   Assessment:   Musculoskeletal: Strength & Muscle Tone: Patient ambulates slowly with a cane. Gait & Station: Slow and with cane Patient leans: N/A  Psychiatric Specialty Exam: Medication Refill   Anxiety  Patient reports no insomnia, nervous/anxious behavior or suicidal ideas.    Depression         Associated symptoms include does not have insomnia and no suicidal ideas.  Past  medical history includes anxiety.     Review of Systems  Psychiatric/Behavioral: Negative for depression, hallucinations, memory loss, substance abuse and suicidal ideas. The patient is not nervous/anxious and does not have insomnia.   All other systems reviewed and are negative.   Blood pressure 124/83, pulse 82, temperature 98.2 F (36.8 C), temperature source Oral, weight 232 lb 12.8 oz (105.6 kg).Body mass index is 38.74 kg/m.  General Appearance: Neat and Well Groomed  Eye Contact:  Good  Speech:  Clear and Coherent  Volume:  Normal  Mood: some improvement  Affect:   Congruent  Thought Process:  Linear and Logical  Orientation:  Full (Time, Place, and Person)  Thought Content:  Negative  Suicidal Thoughts:  No  Homicidal Thoughts:  No  Memory:  Immediate;   Good Recent;   Good Remote;   Good  Judgement:  Good  Insight:  Good  Psychomotor Activity:  Negative  Concentration:  Good  Recall:  Good  Fund of Knowledge: Good  Language: Good  Akathisia:  Negative  Handed:  Right unknown   AIMS (if indicated):  N/A  Assets:  Communication Skills Desire for Improvement Social Support  ADL's:  Intact  Cognition: WNL  Sleep:  Not sleeping well, not taking ambien.   Is the patient at risk to self?  No. Has the patient been a risk to self in the past 6 months?  No. Has the patient been a risk to self within the distant past?  No. Is the patient a risk to others?  No. Has the patient been a risk to others in the past 6 months?  No. Has the patient been a risk to others within the distant past?  No.  Current Medications: Current Outpatient Prescriptions  Medication Sig Dispense Refill  . aspirin 81 MG tablet Take 81 mg by mouth daily.    . baclofen (LIORESAL) 20 MG tablet Take 20 mg by mouth 4 (four) times daily.    . calcium carbonate (OS-CAL) 600 MG TABS tablet Take 600 mg by mouth 2 (two) times daily with a meal.    . cetirizine (ZYRTEC) 10 MG tablet Take 10 mg by mouth daily.    . Cholecalciferol (VITAMIN D3) 1000 UNITS CAPS Take 2 capsules by mouth daily.     . DULoxetine (CYMBALTA) 60 MG capsule Take 60 mg by mouth daily.  0  . fluticasone (FLONASE) 50 MCG/ACT nasal spray Place 2 sprays into both nostrils daily.    Marland Kitchen lactobacillus acidophilus (BACID) TABS tablet Take 2 tablets by mouth daily.    . natalizumab (TYSABRI) 300 MG/15ML injection Inject into the vein.    . pantoprazole (PROTONIX) 40 MG tablet   0  . Polyethylene Glycol POWD Take 17 g by mouth daily. 527 g 3  . zolpidem (AMBIEN) 5 MG tablet   0   No current facility-administered  medications for this visit.     Medical Decision Making:  Established Problem, Stable/Improving (1), Review of Medication Regimen & Side Effects (2) and Review of New Medication or Change in Dosage (2)  Treatment Plan Summary:Medication management and Plan   Major depressive disorder, recurrent, moderate  Continue cymbalta at 60mg  daily. Patient recommended to have a good diet and take care of herself. Patient recommended to follow-up with the massage therapy or physical therapy as recommended by her neurologist Take ambien for insomnia She continues to see Felecia Jan for therapy.  Anxiety-Same as above.  RTC in 2 months or call before if  necessary.  Marland KitchenRAVI, Sallye Lunz 02/22/2017, 11:48 AM

## 2017-02-23 DIAGNOSIS — I10 Essential (primary) hypertension: Secondary | ICD-10-CM | POA: Diagnosis not present

## 2017-02-23 DIAGNOSIS — G4700 Insomnia, unspecified: Secondary | ICD-10-CM | POA: Diagnosis not present

## 2017-02-23 DIAGNOSIS — N951 Menopausal and female climacteric states: Secondary | ICD-10-CM | POA: Diagnosis not present

## 2017-02-23 DIAGNOSIS — G35 Multiple sclerosis: Secondary | ICD-10-CM | POA: Diagnosis not present

## 2017-02-26 ENCOUNTER — Inpatient Hospital Stay: Payer: Medicare Other

## 2017-03-01 DIAGNOSIS — G35 Multiple sclerosis: Secondary | ICD-10-CM | POA: Diagnosis not present

## 2017-03-01 DIAGNOSIS — Z79899 Other long term (current) drug therapy: Secondary | ICD-10-CM | POA: Diagnosis not present

## 2017-03-01 DIAGNOSIS — M792 Neuralgia and neuritis, unspecified: Secondary | ICD-10-CM | POA: Diagnosis not present

## 2017-03-01 DIAGNOSIS — M62838 Other muscle spasm: Secondary | ICD-10-CM | POA: Diagnosis not present

## 2017-03-02 ENCOUNTER — Ambulatory Visit: Payer: Self-pay

## 2017-03-02 MED ORDER — NATALIZUMAB 300 MG/15ML IV CONC
300.0000 mg | Freq: Once | INTRAVENOUS | Status: AC
  Administered 2017-03-03: 300 mg via INTRAVENOUS
  Filled 2017-03-02: qty 15

## 2017-03-02 MED ORDER — ACETAMINOPHEN 500 MG PO TABS
1000.0000 mg | ORAL_TABLET | Freq: Once | ORAL | Status: AC
  Administered 2017-03-03: 1000 mg via ORAL
  Filled 2017-03-02: qty 2

## 2017-03-02 MED ORDER — SODIUM CHLORIDE 0.9 % IV SOLN
Freq: Once | INTRAVENOUS | Status: AC
  Administered 2017-03-03: 11:00:00 via INTRAVENOUS
  Filled 2017-03-02: qty 1000

## 2017-03-03 ENCOUNTER — Inpatient Hospital Stay: Payer: No Typology Code available for payment source | Attending: Family Medicine

## 2017-03-03 ENCOUNTER — Ambulatory Visit: Payer: Self-pay | Admitting: Hematology and Oncology

## 2017-03-03 ENCOUNTER — Encounter: Payer: Self-pay | Admitting: Hematology and Oncology

## 2017-03-03 ENCOUNTER — Ambulatory Visit: Payer: Self-pay

## 2017-03-03 ENCOUNTER — Inpatient Hospital Stay (HOSPITAL_BASED_OUTPATIENT_CLINIC_OR_DEPARTMENT_OTHER): Payer: No Typology Code available for payment source | Admitting: Hematology and Oncology

## 2017-03-03 ENCOUNTER — Other Ambulatory Visit: Payer: Self-pay | Admitting: Hematology and Oncology

## 2017-03-03 VITALS — BP 128/74 | HR 66 | Resp 18

## 2017-03-03 VITALS — BP 115/79 | HR 78 | Temp 97.7°F | Resp 18 | Wt 233.7 lb

## 2017-03-03 DIAGNOSIS — I10 Essential (primary) hypertension: Secondary | ICD-10-CM | POA: Insufficient documentation

## 2017-03-03 DIAGNOSIS — G35 Multiple sclerosis: Secondary | ICD-10-CM

## 2017-03-03 DIAGNOSIS — Z79899 Other long term (current) drug therapy: Secondary | ICD-10-CM

## 2017-03-03 DIAGNOSIS — K219 Gastro-esophageal reflux disease without esophagitis: Secondary | ICD-10-CM | POA: Insufficient documentation

## 2017-03-03 NOTE — Progress Notes (Signed)
Patient states she is having a lot of leg spasms.  States she saw her neurologist recently.  She has appointment to see if she is a candidate for baclofen pump.  Had no new lesions when seen. Patient recently placed on Klonopin.

## 2017-03-03 NOTE — Patient Instructions (Signed)

## 2017-03-03 NOTE — Progress Notes (Signed)
[facility]-  Cancer Center  Clinic day:  03/03/2017   Chief Complaint: Rose Clements is a 56 y.o. female with multiple sclerosis who is seen for 6 month assessment and continuation of Tysabri.  HPI:   The patient was last seen in the medical oncology clinic on 08/12/2016.  At that time, she was seen for initial assessment.  MS symptoms include muscle spasticity, dysesthesias in bilateral lower extremities, gait instability, urinary incontinence, and cognitive difficulty (word-finding difficulty).  She continued Tysabri.  She has continued to receive monthly Tysabri (09/18/2016 - 02/03/2017).  During the interim, she describes similar symptoms to her last visit. She is going to pain management for spasticity on Monday. She states that her MRI at [facility] revelaed no new lesions.  She has had no interval problems with infections.   Past Medical History:  Diagnosis Date  . Constipation   . Cystitis bacillary, chronic   . Depression   . Dysphagia    Dx by Beverely Risen on 10/15/14  . Eosinophilic esophagitis 11/19/2014   GE junction benign stricture s/p balloon dilation, retained food contents. ?gastroparesis versus secondary to EE.  Marland Kitchen Esophageal dysphagia   . GERD (gastroesophageal reflux disease)   . Heartburn   . Hypertension   . Multilevel degenerative disc disease   . Multiple sclerosis (HCC)   . Multiple sclerosis (HCC) 07/16/2015    Past Surgical History:  Procedure Laterality Date  . ABDOMINAL HYSTERECTOMY  2003  . CARPAL TUNNEL RELEASE Right 1997  . CESAREAN SECTION  1989  . CYSTECTOMY  2002   From chest  . ESOPHAGOGASTRODUODENOSCOPY (EGD) WITH PROPOFOL N/A 05/19/2016   Procedure: ESOPHAGOGASTRODUODENOSCOPY (EGD) WITH PROPOFOL;  Surgeon: [name], MD;  Location: [facility] ENDOSCOPY;  Service: Endoscopy;  Laterality: N/A;  . KIDNEY DONATION Left 2002  . OVARIAN CYST REMOVAL Left 2002   Ovarian mass removal  . TONSILLECTOMY  1973  . UPPER GASTROINTESTINAL  ENDOSCOPY  11/19/2014   Benign appearing esophageal stricture-Dilated.    Family History  Problem Relation Age of Onset  . Diabetes Mother   . Hypertension Mother   . Heart disease Mother   . Pulmonary embolism Mother   . Diabetes Father   . Diabetes Sister   . Hypertension Sister   . Heart disease Sister   . Bipolar disorder Sister   . Narcolepsy Brother   . Heart attack Maternal Grandmother   . Pancreatic cancer Maternal Grandfather     Social History:  reports that she has never smoked. She has never used smokeless tobacco. She reports that she does not drink alcohol or use drugs.  The patient is accompanied by her friend, Rose Clements, today.  Allergies: No Known Allergies  Current Medications: Current Outpatient Prescriptions  Medication Sig Dispense Refill  . aspirin 81 MG tablet Take 81 mg by mouth daily.    . baclofen (LIORESAL) 20 MG tablet Take 20 mg by mouth 4 (four) times daily.    . calcium carbonate (OS-CAL) 600 MG TABS tablet Take 600 mg by mouth 2 (two) times daily with a meal.    . cetirizine (ZYRTEC) 10 MG tablet Take 10 mg by mouth daily.    . Cholecalciferol (VITAMIN D3) 1000 UNITS CAPS Take 2 capsules by mouth daily.     . DULoxetine (CYMBALTA) 60 MG capsule Take 1 capsule (60 mg total) by mouth daily. 30 capsule 1  . fluticasone (FLONASE) 50 MCG/ACT nasal spray Place 2 sprays into both nostrils daily.    Marland Kitchen lactobacillus  acidophilus (BACID) TABS tablet Take 2 tablets by mouth daily.    . natalizumab (TYSABRI) 300 MG/15ML injection Inject into the vein.    . pantoprazole (PROTONIX) 40 MG tablet   0  . Polyethylene Glycol POWD Take 17 g by mouth daily. 527 g 3  . clonazePAM (KLONOPIN) 0.5 MG tablet Take 0.5 mg by mouth. Take 1/2 tablet by mouth in the morning and one tablet at bedtime  0  . zolpidem (AMBIEN) 5 MG tablet   0   No current facility-administered medications for this visit.    Facility-Administered Medications Ordered in Other Visits  Medication  Dose Route Frequency Provider Last Rate Last Dose  . 0.9 %  sodium chloride infusion   Intravenous Once Rosey Bath, MD      . acetaminophen (TYLENOL) tablet 1,000 mg  1,000 mg Oral Once Rosey Bath, MD      . natalizumab (TYSABRI) 300 mg in sodium chloride 0.9 % 100 mL IVPB  300 mg Intravenous Once Rosey Bath, MD        Review of Systems:  GENERAL:  Energy level is fair.  Sweats.  Feels cold.  Weight up 22 pounds. PERFORMANCE STATUS (ECOG):  2 HEENT:  No visual changes, runny nose, sore throat, mouth sores or tenderness. Lungs: Shortness of breath with talking.  No cough.  No hemoptysis.  PFTs in 09/2016. Cardiac:  No chest pain, palpitations, orthopnea, or PND. GI:  Constipation, uses Miralax.  No nausea, vomiting, diarrhea, melena or hematochezia.  Colonoscopy 2012. GU:  Urinary incontinence.  No urgency, frequency, dysuria, or hematuria. Musculoskeletal:  Spasms on left side of neck.  No back pain.  Arhtritis.  No muscle tenderness. Extremities:  Leg spasms.  No swelling. Skin:  No rashes or skin changes. Neuro:  Bilateral leg numbness.  Left leg dragging.  No headache, numbness or weakness, balance or coordination issues. Endocrine:  No diabetes, thyroid issues, hot flashes or night sweats. Psych:  No mood changes, depression or anxiety. Pain:  No focal pain. Review of systems:  All other systems reviewed and found to be negative.  Physical Exam: Blood pressure 115/79, pulse 78, temperature 97.7 F (36.5 C), temperature source Tympanic, resp. rate 18, weight 233 lb 11 oz (106 kg). GENERAL:  Well developed, well nourished, woman sitting comfortably in the exam room in no acute distress.  Rolling walker at her side. MENTAL STATUS:  Alert and oriented to person, place and time. HEAD:  Wearing a scarf.  Normocephalic, atraumatic, face symmetric, no Cushingoid features. EYES:  Glasses.  Brown eyes.  Left eyebrow pierced.  Pupils equal round and reactive to light and  accomodation.  No conjunctivitis or scleral icterus. ENT:  Oropharynx clear without lesion.  Tongue normal. Mucous membranes moist.  RESPIRATORY:  Clear to auscultation without rales, wheezes or rhonchi. CARDIOVASCULAR:  Regular rate and rhythm without murmur, rub or gallop. ABDOMEN:  Soft, non-tender, with active bowel sounds, and no hepatosplenomegaly.  No masses. SKIN:  No rashes, ulcers or lesions. EXTREMITIES: No edema, no skin discoloration or tenderness.  No palpable cords. LYMPH NODES: No palpable cervical, supraclavicular, axillary or inguinal adenopathy  NEUROLOGICAL: Alert & oriented, cranial nerves II-XII intact; motor strength 5/5 throughout; sensation intact; Left sided finger to nose dysmetria. RAM normal; left patella reflex 2++.  PSYCH:  Appropriate.   No visits with results within 3 Day(s) from this visit.  Latest known visit with results is:  No results found for any previous visit.  Assessment:  Rose Clements is a 56 y.o. female with multiple sclerosis since 2011.  She presented with walking difficulties.  MRI scans and CSF analysis confirmed the diagnosis of multiple sclerosis.  She initially received Avonex (interferon beta 1 a).   She developed progressive disease and was switched to Tysabri (natalizumab) in 07/2015.   She has continued to receive Tysabri monthly (last 02/03/2017).  Anti JC virus antibody index is performed every 6 months. Testing was negative on 02/28/2016.  Last report was negative per patient.    Symptomatically, she is struggling with muscle spasms.  Exam is stable.  Plan: 1.  Await results of anti-JC virus testing before Tysabri today. 2.  RTC monthly for Tysabri. 3.  RTC in 6 months for MD assessment and Tysabri.   Rosey Bath, MD  03/03/2017, 10:21 AM

## 2017-03-10 ENCOUNTER — Inpatient Hospital Stay: Payer: PRIVATE HEALTH INSURANCE

## 2017-03-12 DIAGNOSIS — F331 Major depressive disorder, recurrent, moderate: Secondary | ICD-10-CM | POA: Diagnosis not present

## 2017-03-26 DIAGNOSIS — F331 Major depressive disorder, recurrent, moderate: Secondary | ICD-10-CM | POA: Diagnosis not present

## 2017-03-30 MED ORDER — SODIUM CHLORIDE 0.9 % IV SOLN
Freq: Once | INTRAVENOUS | Status: AC
  Administered 2017-03-31: 10:00:00 via INTRAVENOUS
  Filled 2017-03-30: qty 1000

## 2017-03-30 MED ORDER — SODIUM CHLORIDE 0.9 % IV SOLN
300.0000 mg | Freq: Once | INTRAVENOUS | Status: AC
  Administered 2017-03-31: 300 mg via INTRAVENOUS
  Filled 2017-03-30: qty 15

## 2017-03-30 MED ORDER — ACETAMINOPHEN 500 MG PO TABS
1000.0000 mg | ORAL_TABLET | Freq: Once | ORAL | Status: AC
  Administered 2017-03-31: 1000 mg via ORAL
  Filled 2017-03-30: qty 2

## 2017-03-31 ENCOUNTER — Inpatient Hospital Stay: Payer: No Typology Code available for payment source | Attending: Family Medicine

## 2017-03-31 VITALS — BP 122/82 | HR 69 | Temp 97.5°F | Resp 18

## 2017-03-31 DIAGNOSIS — G35 Multiple sclerosis: Secondary | ICD-10-CM

## 2017-03-31 NOTE — Patient Instructions (Signed)

## 2017-04-07 ENCOUNTER — Inpatient Hospital Stay: Payer: PRIVATE HEALTH INSURANCE

## 2017-04-09 DIAGNOSIS — F331 Major depressive disorder, recurrent, moderate: Secondary | ICD-10-CM | POA: Diagnosis not present

## 2017-04-22 ENCOUNTER — Ambulatory Visit: Payer: No Typology Code available for payment source | Admitting: Psychiatry

## 2017-04-27 MED ORDER — ACETAMINOPHEN 500 MG PO TABS
1000.0000 mg | ORAL_TABLET | Freq: Once | ORAL | Status: AC
  Administered 2017-04-28: 1000 mg via ORAL
  Filled 2017-04-27: qty 2

## 2017-04-27 MED ORDER — SODIUM CHLORIDE 0.9 % IV SOLN
300.0000 mg | Freq: Once | INTRAVENOUS | Status: AC
  Administered 2017-04-28: 300 mg via INTRAVENOUS
  Filled 2017-04-27: qty 15

## 2017-04-27 MED ORDER — SODIUM CHLORIDE 0.9 % IV SOLN
Freq: Once | INTRAVENOUS | Status: AC
  Administered 2017-04-28: 11:00:00 via INTRAVENOUS
  Filled 2017-04-27: qty 1000

## 2017-04-28 ENCOUNTER — Inpatient Hospital Stay: Payer: No Typology Code available for payment source | Attending: Family Medicine

## 2017-04-28 VITALS — BP 121/78 | HR 73 | Temp 98.0°F | Resp 18

## 2017-04-28 DIAGNOSIS — Z79899 Other long term (current) drug therapy: Secondary | ICD-10-CM | POA: Insufficient documentation

## 2017-04-28 DIAGNOSIS — G35 Multiple sclerosis: Secondary | ICD-10-CM | POA: Diagnosis not present

## 2017-05-06 DIAGNOSIS — F331 Major depressive disorder, recurrent, moderate: Secondary | ICD-10-CM | POA: Diagnosis not present

## 2017-05-13 DIAGNOSIS — R252 Cramp and spasm: Secondary | ICD-10-CM | POA: Diagnosis not present

## 2017-05-24 ENCOUNTER — Ambulatory Visit: Payer: Medicare Other | Admitting: Psychiatry

## 2017-05-25 MED ORDER — SODIUM CHLORIDE 0.9 % IV SOLN
300.0000 mg | Freq: Once | INTRAVENOUS | Status: AC
  Administered 2017-05-26: 300 mg via INTRAVENOUS
  Filled 2017-05-25: qty 15

## 2017-05-25 MED ORDER — SODIUM CHLORIDE 0.9 % IV SOLN
Freq: Once | INTRAVENOUS | Status: AC
  Administered 2017-05-26: 10:00:00 via INTRAVENOUS
  Filled 2017-05-25: qty 1000

## 2017-05-25 MED ORDER — ACETAMINOPHEN 500 MG PO TABS
1000.0000 mg | ORAL_TABLET | Freq: Once | ORAL | Status: AC
  Administered 2017-05-26: 1000 mg via ORAL
  Filled 2017-05-25: qty 2

## 2017-05-26 ENCOUNTER — Inpatient Hospital Stay: Payer: No Typology Code available for payment source | Attending: Oncology

## 2017-05-26 VITALS — BP 109/77 | HR 73 | Temp 97.9°F | Resp 18

## 2017-05-26 DIAGNOSIS — G35 Multiple sclerosis: Secondary | ICD-10-CM

## 2017-05-26 DIAGNOSIS — Z79899 Other long term (current) drug therapy: Secondary | ICD-10-CM | POA: Insufficient documentation

## 2017-05-28 DIAGNOSIS — F331 Major depressive disorder, recurrent, moderate: Secondary | ICD-10-CM | POA: Diagnosis not present

## 2017-05-31 ENCOUNTER — Ambulatory Visit (INDEPENDENT_AMBULATORY_CARE_PROVIDER_SITE_OTHER): Payer: PRIVATE HEALTH INSURANCE | Admitting: Psychiatry

## 2017-05-31 ENCOUNTER — Encounter: Payer: Self-pay | Admitting: Psychiatry

## 2017-05-31 VITALS — BP 124/85 | HR 89 | Temp 98.0°F | Wt 234.2 lb

## 2017-05-31 DIAGNOSIS — F331 Major depressive disorder, recurrent, moderate: Secondary | ICD-10-CM

## 2017-05-31 MED ORDER — ZOLPIDEM TARTRATE 5 MG PO TABS: 5.0000 mg | ORAL_TABLET | Freq: Every evening | ORAL | 1 refills | Status: DC | PRN

## 2017-05-31 NOTE — Progress Notes (Signed)
Patient ID: Rose Clements, female   DOB: August 30, 1961, 56 y.o.   MRN: [id number] [facility] MD/PA/NP OP Progress Note  05/31/2017 2:51 PM Rose Clements  MRN:  [id number]  Subjective:  Patient returns for follow-up of her major depressive disorder, moderate, recurrent. Reports doing ok. States that there were several deaths in her family that has been stressful. Overall reports mood has been okay. Her neurologist increased the Cymbalta to 90 mg daily to help with the pain. She was also started on Klonopin at 0.5 mg to help with hot flashes by her primary care physician. However she reports that she does not want to get dependent on it and is considering tapering off. Patient was told to check with her neurologist if the Klonopin was helping with any of her spasms. Denies any suicidal thoughts. We also discussed that she could start on a hormone supplement after having a consult with a gynecologist.  Chief Complaint:  Deaths in the family Chief Complaint    Follow-up; Medication Refill     Visit Diagnosis:     ICD-10-CM   1. Major depressive disorder, recurrent episode, moderate (HCC) F33.1     Past Medical History:  Past Medical History:  Diagnosis Date  . Constipation   . Cystitis bacillary, chronic   . Depression   . Dysphagia    Dx by Beverely Risen on 10/15/14  . Eosinophilic esophagitis 11/19/2014   GE junction benign stricture s/p balloon dilation, retained food contents. ?gastroparesis versus secondary to EE.  Marland Kitchen Esophageal dysphagia   . GERD (gastroesophageal reflux disease)   . Heartburn   . Hypertension   . Multilevel degenerative disc disease   . Multiple sclerosis (HCC)   . Multiple sclerosis (HCC) 07/16/2015    Past Surgical History:  Procedure Laterality Date  . ABDOMINAL HYSTERECTOMY  2003  . CARPAL TUNNEL RELEASE Right 1997  . CESAREAN SECTION  1989  . CYSTECTOMY  2002   From chest  . ESOPHAGOGASTRODUODENOSCOPY (EGD) WITH PROPOFOL N/A 05/19/2016   Procedure:  ESOPHAGOGASTRODUODENOSCOPY (EGD) WITH PROPOFOL;  Surgeon: [name], MD;  Location: [facility] ENDOSCOPY;  Service: Endoscopy;  Laterality: N/A;  . KIDNEY DONATION Left 2002  . OVARIAN CYST REMOVAL Left 2002   Ovarian mass removal  . TONSILLECTOMY  1973  . UPPER GASTROINTESTINAL ENDOSCOPY  11/19/2014   Benign appearing esophageal stricture-Dilated.   Family History:  Family History  Problem Relation Age of Onset  . Diabetes Mother   . Hypertension Mother   . Heart disease Mother   . Pulmonary embolism Mother   . Diabetes Father   . Diabetes Sister   . Hypertension Sister   . Heart disease Sister   . Bipolar disorder Sister   . Narcolepsy Brother   . Heart attack Maternal Grandmother   . Pancreatic cancer Maternal Grandfather    Social History:  Social History   Social History  . Marital status: Married    Spouse name: N/A  . Number of children: N/A  . Years of education: N/A   Social History Main Topics  . Smoking status: Never Smoker  . Smokeless tobacco: Never Used  . Alcohol use No  . Drug use: No  . Sexual activity: Not Currently   Other Topics Concern  . None   Social History Narrative  . None   Additional History:   Assessment:   Musculoskeletal: Strength & Muscle Tone: Patient ambulates slowly with a cane. Gait & Station: Slow and with cane Patient leans: N/A  Psychiatric Specialty Exam:  Medication Refill   Anxiety  Patient reports no insomnia, nervous/anxious behavior or suicidal ideas.    Depression         Associated symptoms include does not have insomnia and no suicidal ideas.  Past medical history includes anxiety.     Review of Systems  Psychiatric/Behavioral: Negative for depression, hallucinations, memory loss, substance abuse and suicidal ideas. The patient is not nervous/anxious and does not have insomnia.   All other systems reviewed and are negative.   Blood pressure 124/85, pulse 89, temperature 98 F (36.7 C), temperature source  Oral, weight 234 lb 3.2 oz (106.2 kg).Body mass index is 38.97 kg/m.  General Appearance: Neat and Well Groomed  Eye Contact:  Good  Speech:  Clear and Coherent  Volume:  Normal  Mood: doing ok  Affect:  Congruent  Thought Process:  Linear and Logical  Orientation:  Full (Time, Place, and Person)  Thought Content:  Negative  Suicidal Thoughts:  No  Homicidal Thoughts:  No  Memory:  Immediate;   Good Recent;   Good Remote;   Good  Judgement:  Good  Insight:  Good  Psychomotor Activity:  Negative  Concentration:  Good  Recall:  Good  Fund of Knowledge: Good  Language: Good  Akathisia:  Negative  Handed:  Right unknown   AIMS (if indicated):  N/A  Assets:  Communication Skills Desire for Improvement Social Support  ADL's:  Intact  Cognition: WNL  Sleep:  Sleeping well with Ambien    Is the patient at risk to self?  No. Has the patient been a risk to self in the past 6 months?  No. Has the patient been a risk to self within the distant past?  No. Is the patient a risk to others?  No. Has the patient been a risk to others in the past 6 months?  No. Has the patient been a risk to others within the distant past?  No.  Current Medications: Current Outpatient Prescriptions  Medication Sig Dispense Refill  . aspirin 81 MG tablet Take 81 mg by mouth daily.    . baclofen (LIORESAL) 20 MG tablet Take 20 mg by mouth 4 (four) times daily.    . calcium carbonate (OS-CAL) 600 MG TABS tablet Take 600 mg by mouth 2 (two) times daily with a meal.    . cetirizine (ZYRTEC) 10 MG tablet Take 10 mg by mouth daily.    . Cholecalciferol (VITAMIN D3) 1000 UNITS CAPS Take 2 capsules by mouth daily.     . clonazePAM (KLONOPIN) 0.5 MG tablet Take 0.5 mg by mouth. Take 1/2 tablet by mouth in the morning and one tablet at bedtime  0  . DULoxetine (CYMBALTA) 60 MG capsule Take 1 capsule (60 mg total) by mouth daily. 30 capsule 1  . fluticasone (FLONASE) 50 MCG/ACT nasal spray Place 2 sprays into  both nostrils daily.    Marland Kitchen lactobacillus acidophilus (BACID) TABS tablet Take 2 tablets by mouth daily.    . natalizumab (TYSABRI) 300 MG/15ML injection Inject into the vein.    . pantoprazole (PROTONIX) 40 MG tablet   0  . Polyethylene Glycol POWD Take 17 g by mouth daily. 527 g 3  . zolpidem (AMBIEN) 5 MG tablet Take 1 tablet (5 mg total) by mouth at bedtime as needed for sleep. 30 tablet 1   No current facility-administered medications for this visit.     Medical Decision Making:  Established Problem, Stable/Improving (1), Review of Medication Regimen & Side Effects (  2) and Review of New Medication or Change in Dosage (2)  Treatment Plan Summary:Medication management and Plan   Major depressive disorder, recurrent, moderate  Continue cymbalta at 90mg  daily. Take ambien for insomnia She continues to see Felecia Jan for therapy.  Anxiety-Same as above.  Klonopin for muscle spasms per her neurologist and primary care physician  RTC in 3 months or call before if necessary.  Marland KitchenRAVI, Rose Clements 05/31/2017, 2:51 PM

## 2017-06-01 DIAGNOSIS — R252 Cramp and spasm: Secondary | ICD-10-CM | POA: Insufficient documentation

## 2017-06-18 ENCOUNTER — Encounter: Payer: Self-pay | Admitting: Neurology

## 2017-06-22 MED ORDER — SODIUM CHLORIDE 0.9 % IV SOLN
300.0000 mg | Freq: Once | INTRAVENOUS | Status: AC
  Administered 2017-06-23: 300 mg via INTRAVENOUS
  Filled 2017-06-22: qty 15

## 2017-06-22 MED ORDER — ACETAMINOPHEN 500 MG PO TABS
1000.0000 mg | ORAL_TABLET | Freq: Once | ORAL | Status: AC
  Administered 2017-06-23: 1000 mg via ORAL

## 2017-06-22 MED ORDER — SODIUM CHLORIDE 0.9 % IV SOLN
Freq: Once | INTRAVENOUS | Status: AC
  Administered 2017-06-23: 11:00:00 via INTRAVENOUS
  Filled 2017-06-22: qty 1000

## 2017-06-23 ENCOUNTER — Inpatient Hospital Stay: Payer: No Typology Code available for payment source | Attending: Family Medicine

## 2017-06-23 VITALS — BP 117/77 | HR 73 | Temp 97.0°F | Resp 18

## 2017-06-23 DIAGNOSIS — Z79899 Other long term (current) drug therapy: Secondary | ICD-10-CM | POA: Insufficient documentation

## 2017-06-23 DIAGNOSIS — G35 Multiple sclerosis: Secondary | ICD-10-CM

## 2017-06-24 DIAGNOSIS — F331 Major depressive disorder, recurrent, moderate: Secondary | ICD-10-CM | POA: Diagnosis not present

## 2017-07-08 DIAGNOSIS — F331 Major depressive disorder, recurrent, moderate: Secondary | ICD-10-CM | POA: Diagnosis not present

## 2017-07-21 ENCOUNTER — Inpatient Hospital Stay: Payer: No Typology Code available for payment source

## 2017-08-02 ENCOUNTER — Telehealth: Payer: Self-pay | Admitting: *Deleted

## 2017-08-02 NOTE — Telephone Encounter (Signed)
Called and left patient a message r/t appts for her treatment and to call the cancer center back with questions.

## 2017-08-13 DIAGNOSIS — F331 Major depressive disorder, recurrent, moderate: Secondary | ICD-10-CM | POA: Diagnosis not present

## 2017-08-17 NOTE — Progress Notes (Signed)
[facility]-  Cancer Center  Clinic day:  08/18/2017   Chief Complaint: Rose Clements is a 56 y.o. female with multiple sclerosis who is seen for 6 month assessment and continuation of monthly Tysabri.  HPI:   The patient was last seen in the medical oncology clinic on 03/03/2017.  At that time, she was complaining of  MS.  Her symptoms included muscle spasticity, dysesthesias in bilateral lower extremities, gait instability, urinary incontinence, and cognitive difficulty (word-finding difficulty).  She continued Tysabri.  She has continued to receive monthly Tysabri (09/18/2016 - 06/23/2017). She did not receive infusion in August due to her being out of town and having to pay $150 out of pocket while in [place]. She is being followed by [facility] neurology. Plans are to repeat MRI in March 2019. Last anti-JC virus antibody was done on 03/01/2017; result was negative.  She was seen by pain management at [facility] on 05/13/2017 for evaluation and management of her muscle spasms for a consult. Patient will be scheduled for a return follow up once insurance concerns cleared up.   During the interim, she notes feeling "sore" following a fall last night.  She has chronic "soreness" to bilateral lower extremities. Patient ambulatory with use of walker. She is scheduled to see [facility] neurology on 08/30/2017 and PCP on 09/02/2017.   She has nightly headaches that cause her to be nauseasated. She is using Flonase and Zyrtec for chronic allergies. She has weakness in her RIGHT wrist/hand.  She describes a "crawling" sensation and "running pain" in her LEFT upper and lower extremities.  She denies interval infections.    Past Medical History:  Diagnosis Date  . Constipation   . Cystitis bacillary, chronic   . Depression   . Dysphagia    Dx by Beverely Risen on 10/15/14  . Eosinophilic esophagitis 11/19/2014   GE junction benign stricture s/p balloon dilation, retained food contents. ?gastroparesis  versus secondary to EE.  Marland Kitchen Esophageal dysphagia   . GERD (gastroesophageal reflux disease)   . Heartburn   . Hypertension   . Multilevel degenerative disc disease   . Multiple sclerosis (HCC)   . Multiple sclerosis (HCC) 07/16/2015    Past Surgical History:  Procedure Laterality Date  . ABDOMINAL HYSTERECTOMY  2003  . CARPAL TUNNEL RELEASE Right 1997  . CESAREAN SECTION  1989  . CYSTECTOMY  2002   From chest  . ESOPHAGOGASTRODUODENOSCOPY (EGD) WITH PROPOFOL N/A 05/19/2016   Procedure: ESOPHAGOGASTRODUODENOSCOPY (EGD) WITH PROPOFOL;  Surgeon: [name], MD;  Location: [facility] ENDOSCOPY;  Service: Endoscopy;  Laterality: N/A;  . KIDNEY DONATION Left 2002  . OVARIAN CYST REMOVAL Left 2002   Ovarian mass removal  . TONSILLECTOMY  1973  . UPPER GASTROINTESTINAL ENDOSCOPY  11/19/2014   Benign appearing esophageal stricture-Dilated.    Family History  Problem Relation Age of Onset  . Diabetes Mother   . Hypertension Mother   . Heart disease Mother   . Pulmonary embolism Mother   . Diabetes Father   . Diabetes Sister   . Hypertension Sister   . Heart disease Sister   . Bipolar disorder Sister   . Narcolepsy Brother   . Heart attack Maternal Grandmother   . Pancreatic cancer Maternal Grandfather     Social History:  reports that she has never smoked. She has never used smokeless tobacco. She reports that she does not drink alcohol or use drugs.  The patient is alone today.  Allergies: No Known Allergies  Current Medications:  Current Outpatient Prescriptions  Medication Sig Dispense Refill  . aspirin 81 MG tablet Take 81 mg by mouth daily.    . baclofen (LIORESAL) 20 MG tablet Take 20 mg by mouth 4 (four) times daily.    . calcium carbonate (OS-CAL) 600 MG TABS tablet Take 600 mg by mouth 2 (two) times daily with a meal.    . cetirizine (ZYRTEC) 10 MG tablet Take 10 mg by mouth daily.    . Cholecalciferol (VITAMIN D3) 1000 UNITS CAPS Take 2 capsules by mouth daily.     .  DULoxetine (CYMBALTA) 60 MG capsule Take 1 capsule (60 mg total) by mouth daily. 30 capsule 1  . fluticasone (FLONASE) 50 MCG/ACT nasal spray Place 2 sprays into both nostrils daily.    . natalizumab (TYSABRI) 300 MG/15ML injection Inject into the vein.    . pantoprazole (PROTONIX) 40 MG tablet   0  . Polyethylene Glycol POWD Take 17 g by mouth daily. 527 g 3  . zolpidem (AMBIEN) 5 MG tablet Take 1 tablet (5 mg total) by mouth at bedtime as needed for sleep. 30 tablet 1   No current facility-administered medications for this visit.    Facility-Administered Medications Ordered in Other Visits  Medication Dose Route Frequency Provider Last Rate Last Dose  . 0.9 %  sodium chloride infusion   Intravenous Once Nelva Nay C, MD      . acetaminophen (TYLENOL) tablet 1,000 mg  1,000 mg Oral Once Corcoran, Melissa C, MD      . natalizumab (TYSABRI) 300 mg in sodium chloride 0.9 % 100 mL IVPB  300 mg Intravenous Once Rosey Bath, MD        Review of Systems:  GENERAL:  Energy level is fair.  No fevers.  Weight down 4 pounds.  PERFORMANCE STATUS (ECOG):  2 HEENT:  Allergies.  No visual changes, runny nose, sore throat, mouth sores or tenderness. Lungs: Np shortness of breath  Or cough.  No hemoptysis.  PFTs in 09/2016. Cardiac:  No chest pain, palpitations, orthopnea, or PND. GI:  Constipation, uses Miralax.  No nausea, vomiting, diarrhea, melena or hematochezia.  Colonoscopy 2012. GU:  Urinary incontinence.  No urgency, frequency, dysuria, or hematuria. Musculoskeletal:  Spasms on left side of neck.  No back pain.  Arhtritis.  No muscle tenderness. Extremities:  Leg spasms.  No swelling. Skin:  No rashes or skin changes. Neuro:  Bilateral leg numbness.  Left foot drop.  No headache, numbness or weakness, balance or coordination issues. Endocrine:  No diabetes, thyroid issues, hot flashes or night sweats. Psych:  No mood changes, depression or anxiety. Pain:  Chronic lower extremity  discomfort. Review of systems:  All other systems reviewed and found to be negative.  Physical Exam: Blood pressure 128/83, pulse 67, temperature (!) 97.3 F (36.3 C), temperature source Tympanic, resp. rate 18, weight 229 lb 15 oz (104.3 kg). GENERAL:  Well developed, well nourished, woman sitting comfortably in the exam room in no acute distress.  Rolling walker at her side. MENTAL STATUS:  Alert and oriented to person, place and time. HEAD:  Long brown braids.  Normocephalic, atraumatic, face symmetric, no Cushingoid features. EYES:  Glasses.  Brown eyes.  Left eyebrow and ear pierced.  Pupils equal round and reactive to light and accomodation.  No conjunctivitis or scleral icterus. ENT:  Oropharynx clear without lesion.  Tongue normal. Mucous membranes moist.  RESPIRATORY:  Clear to auscultation without rales, wheezes or rhonchi. CARDIOVASCULAR:  Regular rate  and rhythm without murmur, rub or gallop. ABDOMEN:  Soft, non-tender, with active bowel sounds, and no hepatosplenomegaly.  No masses. SKIN:  No rashes, ulcers or lesions. EXTREMITIES: No lower extremity bruising or edema.  No skin discoloration or tenderness.  No palpable cords. LYMPH NODES: No palpable cervical, supraclavicular, axillary or inguinal adenopathy  NEUROLOGICAL: Alert & oriented, cranial nerves II-XII intact; motor strength 4+/5 throughout except for mild bilateral proximal lower extremity weakness and right wrist weakness; sensation intact; RAM and finger to nose normal; left patella reflex 2++.  PSYCH:  Appropriate.   No visits with results within 3 Day(s) from this visit.  Latest known visit with results is:  No results found for any previous visit.    Assessment:  Rose Clements is a 56 y.o. female with multiple sclerosis since 2011.  She presented with walking difficulties.  MRI scans and CSF analysis confirmed the diagnosis of multiple sclerosis.  She initially received Avonex (interferon beta 1 a).   She  developed progressive disease and was switched to Tysabri (natalizumab) in 07/2015.   She has continued to receive Tysabri monthly (last 06/23/2017). She missed 07/2017 infusion due to her being out of town.  Anti-JC virus antibody index is performed every 6 months. Testing was negative on 02/28/2016.  Last report was negative per patient.    Symptomatically, she continues to struggle with muscle spasms.  She recently fell.  She is having headaches that are attributed to her allergies.  Exam reveals right wrist weakness.  Plan: 1.  Tysabri today.  2.  Follow up with neurology as scheduled. Anticipate anti-JC virus testing before next infusion. 3.  RTC monthly for Tysabri. 4.  RTC in 6 months for MD assessment and Tysabri.   Quentin Mulling, NP  08/18/2017, 9:24 AM   I saw and evaluated the patient, participating in the key portions of the service and reviewing pertinent diagnostic studies and records.  I reviewed the nurse practitioner's note and agree with the findings and the plan.  The assessment and plan were discussed with the patient.  A few questions were asked by the patient and answered.   Rosey Bath, MD 08/18/2017,9:43 AM

## 2017-08-18 ENCOUNTER — Ambulatory Visit: Payer: Self-pay

## 2017-08-18 ENCOUNTER — Inpatient Hospital Stay: Payer: Medicare Other | Attending: Hematology and Oncology | Admitting: Hematology and Oncology

## 2017-08-18 ENCOUNTER — Encounter: Payer: Self-pay | Admitting: Hematology and Oncology

## 2017-08-18 VITALS — BP 118/77 | HR 71 | Temp 97.6°F | Resp 18

## 2017-08-18 VITALS — BP 128/83 | HR 67 | Temp 97.3°F | Resp 18 | Wt 229.9 lb

## 2017-08-18 DIAGNOSIS — R51 Headache: Secondary | ICD-10-CM | POA: Insufficient documentation

## 2017-08-18 DIAGNOSIS — Z7982 Long term (current) use of aspirin: Secondary | ICD-10-CM | POA: Diagnosis not present

## 2017-08-18 DIAGNOSIS — R110 Nausea: Secondary | ICD-10-CM | POA: Insufficient documentation

## 2017-08-18 DIAGNOSIS — I10 Essential (primary) hypertension: Secondary | ICD-10-CM

## 2017-08-18 DIAGNOSIS — Z9181 History of falling: Secondary | ICD-10-CM | POA: Diagnosis not present

## 2017-08-18 DIAGNOSIS — Z79899 Other long term (current) drug therapy: Secondary | ICD-10-CM | POA: Diagnosis not present

## 2017-08-18 DIAGNOSIS — G8929 Other chronic pain: Secondary | ICD-10-CM

## 2017-08-18 DIAGNOSIS — R531 Weakness: Secondary | ICD-10-CM | POA: Diagnosis not present

## 2017-08-18 DIAGNOSIS — G35 Multiple sclerosis: Secondary | ICD-10-CM

## 2017-08-18 DIAGNOSIS — K219 Gastro-esophageal reflux disease without esophagitis: Secondary | ICD-10-CM | POA: Diagnosis not present

## 2017-08-18 MED ORDER — SODIUM CHLORIDE 0.9 % IV SOLN
300.0000 mg | Freq: Once | INTRAVENOUS | Status: AC
  Administered 2017-08-18: 300 mg via INTRAVENOUS
  Filled 2017-08-18: qty 15

## 2017-08-18 MED ORDER — SODIUM CHLORIDE 0.9 % IV SOLN
Freq: Once | INTRAVENOUS | Status: AC
  Administered 2017-08-18: 10:00:00 via INTRAVENOUS
  Filled 2017-08-18: qty 1000

## 2017-08-18 MED ORDER — ACETAMINOPHEN 500 MG PO TABS
1000.0000 mg | ORAL_TABLET | Freq: Once | ORAL | Status: AC
  Administered 2017-08-18: 1000 mg via ORAL

## 2017-08-18 MED ORDER — ACETAMINOPHEN 500 MG PO TABS
ORAL_TABLET | ORAL | Status: AC
  Filled 2017-08-18: qty 2

## 2017-08-18 NOTE — Progress Notes (Signed)
Patient has upcoming appointments with PCP and Neurology.  States she fell yesterday.  C/O legs hurting today 8/10.  Also states she has a headache every night.  Sometimes interferes with sleep.  Uses Ambien to help her sleep.

## 2017-08-24 ENCOUNTER — Ambulatory Visit (INDEPENDENT_AMBULATORY_CARE_PROVIDER_SITE_OTHER): Payer: PRIVATE HEALTH INSURANCE | Admitting: Psychiatry

## 2017-08-24 ENCOUNTER — Encounter: Payer: Self-pay | Admitting: Psychiatry

## 2017-08-24 VITALS — BP 123/86 | HR 75 | Temp 97.5°F | Wt 231.8 lb

## 2017-08-24 DIAGNOSIS — F331 Major depressive disorder, recurrent, moderate: Secondary | ICD-10-CM

## 2017-08-24 MED ORDER — ZOLPIDEM TARTRATE 5 MG PO TABS: 5.0000 mg | ORAL_TABLET | Freq: Every evening | ORAL | 1 refills | Status: DC | PRN
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
## 2017-08-30 DIAGNOSIS — Z79899 Other long term (current) drug therapy: Secondary | ICD-10-CM | POA: Diagnosis not present

## 2017-08-30 DIAGNOSIS — G35 Multiple sclerosis: Secondary | ICD-10-CM | POA: Diagnosis not present

## 2017-08-30 DIAGNOSIS — M62838 Other muscle spasm: Secondary | ICD-10-CM | POA: Diagnosis not present

## 2017-08-30 DIAGNOSIS — Z1231 Encounter for screening mammogram for malignant neoplasm of breast: Secondary | ICD-10-CM | POA: Diagnosis not present

## 2017-08-31 ENCOUNTER — Ambulatory Visit: Payer: Medicare Other | Admitting: Psychiatry

## 2017-09-01 ENCOUNTER — Ambulatory Visit: Payer: Self-pay | Admitting: Hematology and Oncology

## 2017-09-07 DIAGNOSIS — G4710 Hypersomnia, unspecified: Secondary | ICD-10-CM | POA: Diagnosis not present

## 2017-09-07 DIAGNOSIS — J069 Acute upper respiratory infection, unspecified: Secondary | ICD-10-CM | POA: Diagnosis not present

## 2017-09-10 DIAGNOSIS — F331 Major depressive disorder, recurrent, moderate: Secondary | ICD-10-CM | POA: Diagnosis not present

## 2017-09-13 DIAGNOSIS — R252 Cramp and spasm: Secondary | ICD-10-CM | POA: Diagnosis not present

## 2017-09-15 ENCOUNTER — Inpatient Hospital Stay: Payer: Medicare Other | Attending: Hematology and Oncology

## 2017-09-15 VITALS — BP 142/84 | HR 64 | Temp 97.4°F | Resp 20

## 2017-09-15 DIAGNOSIS — K219 Gastro-esophageal reflux disease without esophagitis: Secondary | ICD-10-CM | POA: Diagnosis not present

## 2017-09-15 DIAGNOSIS — G35 Multiple sclerosis: Secondary | ICD-10-CM | POA: Insufficient documentation

## 2017-09-15 DIAGNOSIS — Z9181 History of falling: Secondary | ICD-10-CM | POA: Insufficient documentation

## 2017-09-15 DIAGNOSIS — Z79899 Other long term (current) drug therapy: Secondary | ICD-10-CM | POA: Diagnosis not present

## 2017-09-15 DIAGNOSIS — Z7982 Long term (current) use of aspirin: Secondary | ICD-10-CM | POA: Insufficient documentation

## 2017-09-15 DIAGNOSIS — I10 Essential (primary) hypertension: Secondary | ICD-10-CM | POA: Insufficient documentation

## 2017-09-15 MED ORDER — ACETAMINOPHEN 500 MG PO TABS
1000.0000 mg | ORAL_TABLET | Freq: Once | ORAL | Status: AC
Start: 2017-09-15 — End: 2017-09-15
  Administered 2017-09-15: 1000 mg via ORAL
  Filled 2017-09-15: qty 2

## 2017-09-15 MED ORDER — SODIUM CHLORIDE 0.9 % IV SOLN
300.0000 mg | Freq: Once | INTRAVENOUS | Status: AC
  Administered 2017-09-15: 300 mg via INTRAVENOUS
  Filled 2017-09-15: qty 15

## 2017-09-15 MED ORDER — SODIUM CHLORIDE 0.9 % IV SOLN
Freq: Once | INTRAVENOUS | Status: AC
  Administered 2017-09-15: 10:00:00 via INTRAVENOUS
  Filled 2017-09-15: qty 1000

## 2017-09-21 DIAGNOSIS — G47419 Narcolepsy without cataplexy: Secondary | ICD-10-CM | POA: Diagnosis not present

## 2017-09-28 DIAGNOSIS — R0602 Shortness of breath: Secondary | ICD-10-CM | POA: Diagnosis not present

## 2017-09-28 DIAGNOSIS — G47419 Narcolepsy without cataplexy: Secondary | ICD-10-CM | POA: Diagnosis not present

## 2017-09-28 DIAGNOSIS — G4700 Insomnia, unspecified: Secondary | ICD-10-CM | POA: Diagnosis not present

## 2017-10-08 DIAGNOSIS — R262 Difficulty in walking, not elsewhere classified: Secondary | ICD-10-CM | POA: Diagnosis not present

## 2017-10-08 DIAGNOSIS — G8929 Other chronic pain: Secondary | ICD-10-CM | POA: Diagnosis not present

## 2017-10-08 DIAGNOSIS — R252 Cramp and spasm: Secondary | ICD-10-CM | POA: Diagnosis not present

## 2017-10-08 DIAGNOSIS — G35 Multiple sclerosis: Secondary | ICD-10-CM | POA: Diagnosis not present

## 2017-10-13 ENCOUNTER — Inpatient Hospital Stay: Payer: Medicare Other | Attending: Hematology and Oncology

## 2017-10-13 VITALS — BP 129/73 | HR 64 | Temp 96.8°F | Resp 18

## 2017-10-13 DIAGNOSIS — G35 Multiple sclerosis: Secondary | ICD-10-CM | POA: Diagnosis not present

## 2017-10-13 MED ORDER — ACETAMINOPHEN 500 MG PO TABS
1000.0000 mg | ORAL_TABLET | Freq: Once | ORAL | Status: AC
  Administered 2017-10-13: 1000 mg via ORAL
  Filled 2017-10-13: qty 2

## 2017-10-13 MED ORDER — SODIUM CHLORIDE 0.9 % IV SOLN
Freq: Once | INTRAVENOUS | Status: AC
  Administered 2017-10-13: 10:00:00 via INTRAVENOUS
  Filled 2017-10-13: qty 1000

## 2017-10-13 MED ORDER — NATALIZUMAB 300 MG/15ML IV CONC
300.0000 mg | Freq: Once | INTRAVENOUS | Status: AC
  Administered 2017-10-13: 300 mg via INTRAVENOUS
  Filled 2017-10-13: qty 15

## 2017-10-14 DIAGNOSIS — F331 Major depressive disorder, recurrent, moderate: Secondary | ICD-10-CM | POA: Diagnosis not present

## 2017-11-10 ENCOUNTER — Inpatient Hospital Stay: Payer: Medicare Other | Attending: Hematology and Oncology

## 2017-11-10 VITALS — BP 130/78 | HR 86 | Temp 97.2°F | Resp 20

## 2017-11-10 DIAGNOSIS — G35 Multiple sclerosis: Secondary | ICD-10-CM | POA: Insufficient documentation

## 2017-11-10 DIAGNOSIS — Z79899 Other long term (current) drug therapy: Secondary | ICD-10-CM | POA: Insufficient documentation

## 2017-11-10 DIAGNOSIS — Z5111 Encounter for antineoplastic chemotherapy: Secondary | ICD-10-CM | POA: Insufficient documentation

## 2017-11-10 MED ORDER — NATALIZUMAB 300 MG/15ML IV CONC
300.0000 mg | Freq: Once | INTRAVENOUS | Status: AC
  Administered 2017-11-10: 300 mg via INTRAVENOUS
  Filled 2017-11-10: qty 15

## 2017-11-10 MED ORDER — SODIUM CHLORIDE 0.9 % IV SOLN
Freq: Once | INTRAVENOUS | Status: AC
  Administered 2017-11-10: 10:00:00 via INTRAVENOUS
  Filled 2017-11-10: qty 1000

## 2017-11-10 MED ORDER — ACETAMINOPHEN 500 MG PO TABS
ORAL_TABLET | ORAL | Status: AC
  Filled 2017-11-10: qty 2

## 2017-11-10 MED ORDER — ACETAMINOPHEN 500 MG PO TABS
1000.0000 mg | ORAL_TABLET | Freq: Once | ORAL | Status: AC
  Administered 2017-11-10: 1000 mg via ORAL

## 2017-11-23 ENCOUNTER — Ambulatory Visit: Payer: Self-pay | Admitting: Psychiatry

## 2017-11-26 DIAGNOSIS — F331 Major depressive disorder, recurrent, moderate: Secondary | ICD-10-CM | POA: Diagnosis not present

## 2017-12-14 MED ORDER — SODIUM CHLORIDE 0.9 % IV SOLN
300.0000 mg | Freq: Once | INTRAVENOUS | Status: AC
  Administered 2017-12-15: 300 mg via INTRAVENOUS
  Filled 2017-12-14: qty 15

## 2017-12-15 ENCOUNTER — Inpatient Hospital Stay: Payer: Medicare Other | Attending: Hematology and Oncology

## 2017-12-15 VITALS — BP 130/84 | HR 72 | Temp 97.3°F | Resp 20

## 2017-12-15 DIAGNOSIS — G35 Multiple sclerosis: Secondary | ICD-10-CM | POA: Diagnosis not present

## 2017-12-15 DIAGNOSIS — Z5111 Encounter for antineoplastic chemotherapy: Secondary | ICD-10-CM | POA: Insufficient documentation

## 2017-12-15 DIAGNOSIS — Z79899 Other long term (current) drug therapy: Secondary | ICD-10-CM | POA: Diagnosis not present

## 2017-12-15 MED ORDER — SODIUM CHLORIDE 0.9 % IV SOLN
Freq: Once | INTRAVENOUS | Status: AC
  Administered 2017-12-15: 10:00:00 via INTRAVENOUS
  Filled 2017-12-15: qty 1000

## 2017-12-15 MED ORDER — ACETAMINOPHEN 500 MG PO TABS
1000.0000 mg | ORAL_TABLET | Freq: Once | ORAL | Status: AC
  Administered 2017-12-15: 1000 mg via ORAL
  Filled 2017-12-15: qty 2

## 2017-12-15 NOTE — Patient Instructions (Signed)

## 2017-12-29 DIAGNOSIS — R1310 Dysphagia, unspecified: Secondary | ICD-10-CM | POA: Diagnosis not present

## 2017-12-29 DIAGNOSIS — F329 Major depressive disorder, single episode, unspecified: Secondary | ICD-10-CM | POA: Diagnosis not present

## 2017-12-29 DIAGNOSIS — R252 Cramp and spasm: Secondary | ICD-10-CM | POA: Diagnosis not present

## 2017-12-29 DIAGNOSIS — I10 Essential (primary) hypertension: Secondary | ICD-10-CM | POA: Diagnosis not present

## 2017-12-29 DIAGNOSIS — K210 Gastro-esophageal reflux disease with esophagitis: Secondary | ICD-10-CM | POA: Diagnosis not present

## 2017-12-29 DIAGNOSIS — R5382 Chronic fatigue, unspecified: Secondary | ICD-10-CM | POA: Diagnosis not present

## 2017-12-29 DIAGNOSIS — G35 Multiple sclerosis: Secondary | ICD-10-CM | POA: Diagnosis not present

## 2017-12-29 DIAGNOSIS — D8989 Other specified disorders involving the immune mechanism, not elsewhere classified: Secondary | ICD-10-CM | POA: Diagnosis not present

## 2017-12-29 DIAGNOSIS — K200 Eosinophilic esophagitis: Secondary | ICD-10-CM | POA: Diagnosis not present

## 2017-12-29 DIAGNOSIS — K222 Esophageal obstruction: Secondary | ICD-10-CM | POA: Diagnosis not present

## 2017-12-29 DIAGNOSIS — F411 Generalized anxiety disorder: Secondary | ICD-10-CM | POA: Diagnosis not present

## 2017-12-30 DIAGNOSIS — F331 Major depressive disorder, recurrent, moderate: Secondary | ICD-10-CM | POA: Diagnosis not present

## 2018-01-04 DIAGNOSIS — K219 Gastro-esophageal reflux disease without esophagitis: Secondary | ICD-10-CM | POA: Diagnosis not present

## 2018-01-04 DIAGNOSIS — E669 Obesity, unspecified: Secondary | ICD-10-CM | POA: Diagnosis not present

## 2018-01-04 DIAGNOSIS — M62838 Other muscle spasm: Secondary | ICD-10-CM | POA: Diagnosis not present

## 2018-01-04 DIAGNOSIS — Z6838 Body mass index (BMI) 38.0-38.9, adult: Secondary | ICD-10-CM | POA: Diagnosis not present

## 2018-01-04 DIAGNOSIS — I10 Essential (primary) hypertension: Secondary | ICD-10-CM | POA: Diagnosis not present

## 2018-01-04 DIAGNOSIS — F329 Major depressive disorder, single episode, unspecified: Secondary | ICD-10-CM | POA: Diagnosis not present

## 2018-01-04 DIAGNOSIS — Z79899 Other long term (current) drug therapy: Secondary | ICD-10-CM | POA: Diagnosis not present

## 2018-01-04 DIAGNOSIS — G35 Multiple sclerosis: Secondary | ICD-10-CM | POA: Diagnosis not present

## 2018-01-04 DIAGNOSIS — F419 Anxiety disorder, unspecified: Secondary | ICD-10-CM | POA: Diagnosis not present

## 2018-01-04 HISTORY — PX: OTHER SURGICAL HISTORY: SHX169

## 2018-01-05 DIAGNOSIS — G35 Multiple sclerosis: Secondary | ICD-10-CM | POA: Diagnosis not present

## 2018-01-05 DIAGNOSIS — K219 Gastro-esophageal reflux disease without esophagitis: Secondary | ICD-10-CM | POA: Diagnosis not present

## 2018-01-05 DIAGNOSIS — M62838 Other muscle spasm: Secondary | ICD-10-CM | POA: Diagnosis not present

## 2018-01-05 DIAGNOSIS — I10 Essential (primary) hypertension: Secondary | ICD-10-CM | POA: Diagnosis not present

## 2018-01-05 DIAGNOSIS — F419 Anxiety disorder, unspecified: Secondary | ICD-10-CM | POA: Diagnosis not present

## 2018-01-05 DIAGNOSIS — F329 Major depressive disorder, single episode, unspecified: Secondary | ICD-10-CM | POA: Diagnosis not present

## 2018-01-12 ENCOUNTER — Inpatient Hospital Stay: Payer: Medicare Other | Attending: Hematology and Oncology

## 2018-01-12 VITALS — BP 117/80 | HR 81 | Temp 97.6°F | Resp 20

## 2018-01-12 DIAGNOSIS — Z79899 Other long term (current) drug therapy: Secondary | ICD-10-CM | POA: Diagnosis not present

## 2018-01-12 DIAGNOSIS — G35 Multiple sclerosis: Secondary | ICD-10-CM | POA: Diagnosis not present

## 2018-01-12 DIAGNOSIS — Z5111 Encounter for antineoplastic chemotherapy: Secondary | ICD-10-CM | POA: Insufficient documentation

## 2018-01-12 MED ORDER — SODIUM CHLORIDE 0.9 % IV SOLN
Freq: Once | INTRAVENOUS | Status: AC
  Administered 2018-01-12: 10:00:00 via INTRAVENOUS
  Filled 2018-01-12: qty 1000

## 2018-01-12 MED ORDER — SODIUM CHLORIDE 0.9 % IV SOLN
300.0000 mg | Freq: Once | INTRAVENOUS | Status: AC
  Administered 2018-01-12: 300 mg via INTRAVENOUS
  Filled 2018-01-12: qty 15

## 2018-01-12 MED ORDER — ACETAMINOPHEN 500 MG PO TABS: 1000.0000 mg | ORAL_TABLET | Freq: Once | ORAL | Status: DC

## 2018-01-12 NOTE — Progress Notes (Signed)
Patient just recently had a Baclofen pump surgically inserted.

## 2018-01-21 ENCOUNTER — Encounter: Payer: Self-pay | Admitting: Nurse Practitioner

## 2018-01-21 DIAGNOSIS — R252 Cramp and spasm: Secondary | ICD-10-CM | POA: Diagnosis not present

## 2018-01-21 DIAGNOSIS — Z451 Encounter for adjustment and management of infusion pump: Secondary | ICD-10-CM | POA: Diagnosis not present

## 2018-02-03 DIAGNOSIS — F331 Major depressive disorder, recurrent, moderate: Secondary | ICD-10-CM | POA: Diagnosis not present

## 2018-02-08 NOTE — Progress Notes (Signed)
[facility]-  Cancer Center  Clinic day:  02/09/2018   Chief Complaint: Rose Clements is a 57 y.o. female with multiple sclerosis who is seen for 6 month assessment and continuation of monthly Tysabri.  HPI:   The patient was last seen in the medical oncology clinic on 08/18/2017.  At that time, patient complained of feeling "sore" following a mechanical fall.  She noted chronic "soreness" to her bilateral lower extremities.  Patient ambulatory with use of walker.  Patient followed by [facility] neurology.  Patient noted nightly headaches and nausea.  She was using Flonase and Zyrtec for chronic allergies.  Patient with chronic weakness in her right wrist/hand.  She described a "crawling" sensation and "running pain" in her left upper and lower extremities.  Since her last visit, patient has been treated with monthly Tysabri (last 01/12/2018).  Last JCV antibody result available for review from 03/01/2017 was negative (index value 0.13). Patient last seen by neurology on 08/30/2017.   Patient was admitted to [facility] on 01/04/2018 for a permanent intrathecal baclofen pump insertion.  Patient was discharged home on 01/05/2018 following an uncomplicated hospital admission.  Symptomatically, she is feeling "better" with her baclofen pump. She notes that she is not as stiff. Pump dose is being titrated as patient is still taking oral baclofen 10 mg BID as well. She continues to have "burning and numbness" in her legs. She has LEFT sided foot drop. Patient with chronic allergy issues. Patient continues to experience intermittent urinary incontinence.   Patient ambulates with the assistance of a rollator walker. She is eating well. Her weight is up 4 pounds. Patient complains of pain rated 6/10 today.    Past Medical History:  Diagnosis Date  . Constipation   . Cystitis bacillary, chronic   . Depression   . Dysphagia    Dx by Beverely Risen on 10/15/14  . Eosinophilic esophagitis 11/19/2014    GE junction benign stricture s/p balloon dilation, retained food contents. ?gastroparesis versus secondary to EE.  Marland Kitchen Esophageal dysphagia   . GERD (gastroesophageal reflux disease)   . Heartburn   . Hypertension   . Multilevel degenerative disc disease   . Multiple sclerosis (HCC)   . Multiple sclerosis (HCC) 07/16/2015    Past Surgical History:  Procedure Laterality Date  . ABDOMINAL HYSTERECTOMY  2003  . CARPAL TUNNEL RELEASE Right 1997  . CESAREAN SECTION  1989  . CYSTECTOMY  2002   From chest  . ESOPHAGOGASTRODUODENOSCOPY (EGD) WITH PROPOFOL N/A 05/19/2016   Procedure: ESOPHAGOGASTRODUODENOSCOPY (EGD) WITH PROPOFOL;  Surgeon: [name], MD;  Location: [facility] ENDOSCOPY;  Service: Endoscopy;  Laterality: N/A;  . KIDNEY DONATION Left 2002  . OVARIAN CYST REMOVAL Left 2002   Ovarian mass removal  . TONSILLECTOMY  1973  . UPPER GASTROINTESTINAL ENDOSCOPY  11/19/2014   Benign appearing esophageal stricture-Dilated.    Family History  Problem Relation Age of Onset  . Diabetes Mother   . Hypertension Mother   . Heart disease Mother   . Pulmonary embolism Mother   . Diabetes Father   . Diabetes Sister   . Hypertension Sister   . Heart disease Sister   . Bipolar disorder Sister   . Narcolepsy Brother   . Heart attack Maternal Grandmother   . Pancreatic cancer Maternal Grandfather     Social History:  reports that  has never smoked. she has never used smokeless tobacco. She reports that she does not drink alcohol or use drugs.  She lives in  [place].  The patient is alone today.  Allergies: No Known Allergies  Current Medications: Current Outpatient Medications  Medication Sig Dispense Refill  . aspirin 81 MG tablet Take 81 mg by mouth daily.    . baclofen (LIORESAL) 20 MG tablet Take 20 mg by mouth 2 (two) times daily.     . calcium carbonate (OS-CAL) 600 MG TABS tablet Take 600 mg by mouth 2 (two) times daily with a meal.    . cetirizine (ZYRTEC) 10 MG tablet Take 10 mg  by mouth daily.    . Cholecalciferol (VITAMIN D3) 1000 UNITS CAPS Take 2 capsules by mouth daily.     . DULoxetine (CYMBALTA) 60 MG capsule Take 1 capsule (60 mg total) by mouth daily. 30 capsule 1  . fluticasone (FLONASE) 50 MCG/ACT nasal spray Place 2 sprays into both nostrils daily.    . natalizumab (TYSABRI) 300 MG/15ML injection Inject into the vein.    . pantoprazole (PROTONIX) 40 MG tablet   0  . Polyethylene Glycol POWD Take 17 g by mouth daily. 527 g 3  . sennosides-docusate sodium (SENOKOT-S) 8.6-50 MG tablet Take 2 tablets by mouth 2 (two) times daily.    Marland Kitchen tiZANidine (ZANAFLEX) 2 MG tablet Take 2 mg by mouth 3 (three) times daily.  11   No current facility-administered medications for this visit.     Review of Systems:  GENERAL:  Energy level is fair.  Feeling better after initiation of baclofen pump.  No fevers.  Weight up 4 pounds.  PERFORMANCE STATUS (ECOG):  2 HEENT:  Allergies.  No visual changes, runny nose, sore throat, mouth sores or tenderness. Lungs: No shortness of breath or cough.  No hemoptysis.  Cardiac:  No chest pain, palpitations, orthopnea, or PND. GI:  Constipation, uses Miralax.  No nausea, vomiting, diarrhea, melena or hematochezia.  Colonoscopy 2012. GU:  Urinary incontinence.  No urgency, frequency, dysuria, or hematuria. Musculoskeletal:  Spasms on left side of neck.  LEFT foot drop. No back pain.  Arhtritis.  No muscle tenderness. Extremities:  Leg spasms.  "Burning and numbness in legs".  No swelling. Skin:  No rashes or skin changes. Neuro:  Bilateral leg numbness.  Left foot drop.  No headache, numbness or weakness, balance or coordination issues. Endocrine:  No diabetes, thyroid issues, hot flashes or night sweats. Psych:  No mood changes, depression or anxiety. Pain:  6/10 - chronic lower extremity discomfort. Review of systems:  All other systems reviewed and found to be negative.  Physical Exam: Blood pressure 126/85, pulse 63, temperature  (!) 96.7 F (35.9 C), temperature source Tympanic, weight 233 lb 4 oz (105.8 kg). GENERAL:  Well developed, well nourished, woman sitting comfortably in the exam room in no acute distress.  She has a rolling walker at her side. MENTAL STATUS:  Alert and oriented to person, place and time. HEAD:  Wearing a black cap.  Brown hair.  Normocephalic, atraumatic, face symmetric, no Cushingoid features. EYES:  Glasses.  Brown eyes.  Left eyebrow and ear pierced.  Pupils equal round and reactive to light and accomodation.  No conjunctivitis or scleral icterus. ENT:  Oropharynx clear without lesion.  Tongue normal. Mucous membranes moist.  RESPIRATORY:  Clear to auscultation without rales, wheezes or rhonchi. CARDIOVASCULAR:  Regular rate and rhythm without murmur, rub or gallop. ABDOMEN:  Soft, non-tender, with active bowel sounds, and no hepatosplenomegaly.  No masses. SKIN:  No rashes, ulcers or lesions. EXTREMITIES: No lower extremity bruising or edema.  No  skin discoloration or tenderness.  No palpable cords. LYMPH NODES: No palpable cervical, supraclavicular, axillary or inguinal adenopathy  NEUROLOGICAL: Alert & oriented, cranial nerves II-XII intact; motor strength 4+/5 throughout except for mild bilateral proximal lower extremity weakness and right wrist weakness; sensation intact; RAM and finger to nose normal; left patella reflex 2++.  PSYCH:  Appropriate.   No visits with results within 3 Day(s) from this visit.  Latest known visit with results is:  No results found for any previous visit.    Assessment:  Rose Clements is a 57 y.o. female with multiple sclerosis since 2011.  She presented with walking difficulties.  MRI scans and CSF analysis confirmed the diagnosis of multiple sclerosis.  She initially received Avonex (interferon beta 1 a).   She developed progressive disease and was switched to Tysabri (natalizumab) in 07/2015.   She has continued to receive Tysabri monthly (last  01/12/2018). She missed 07/2017 infusion due to her being out of town.  Anti-JC virus antibody index is performed every 6 months. Testing was negative on 03/01/2017.  Last report was negative per patient.    She underwent permanent intrathecal baclofen pump insertion on 01/04/2018.  Symptomatically, she is feeling better after baclofen pump placement.  She has chronic pain in her lower extremities. She has muscle spasms in her neck and lower extremities related to her multiple sclerosis.  She has headaches that she attributes to her allergies.  Exam is stable.   Plan: 1.  Tysabri today.  2.  Follow up with neurology as scheduled.  3.  Verify recent anti-JC virus testing- done. 4.  RTC monthly for Tysabri. 5.  RTC in 6 months for MD assessment and Tysabri.   Quentin Mulling, NP  02/09/2018, 9:54 AM   I saw and evaluated the patient, participating in the key portions of the service and reviewing pertinent diagnostic studies and records.  I reviewed the nurse practitioner's note and agree with the findings and the plan.  The assessment and plan were discussed with the patient.  A few questions were asked by the patient and answered.   Rose Bath, MD 02/09/2018,9:54 AM

## 2018-02-09 ENCOUNTER — Encounter: Payer: Self-pay | Admitting: Hematology and Oncology

## 2018-02-09 ENCOUNTER — Inpatient Hospital Stay: Payer: Medicare Other | Attending: Hematology and Oncology | Admitting: Hematology and Oncology

## 2018-02-09 ENCOUNTER — Inpatient Hospital Stay: Payer: Medicare Other

## 2018-02-09 VITALS — BP 126/85 | HR 63 | Temp 96.7°F | Wt 233.2 lb

## 2018-02-09 VITALS — BP 143/82 | HR 67 | Resp 18

## 2018-02-09 DIAGNOSIS — F329 Major depressive disorder, single episode, unspecified: Secondary | ICD-10-CM | POA: Diagnosis not present

## 2018-02-09 DIAGNOSIS — G35 Multiple sclerosis: Secondary | ICD-10-CM | POA: Insufficient documentation

## 2018-02-09 DIAGNOSIS — I10 Essential (primary) hypertension: Secondary | ICD-10-CM | POA: Diagnosis not present

## 2018-02-09 DIAGNOSIS — Z7982 Long term (current) use of aspirin: Secondary | ICD-10-CM | POA: Insufficient documentation

## 2018-02-09 DIAGNOSIS — Z79899 Other long term (current) drug therapy: Secondary | ICD-10-CM | POA: Insufficient documentation

## 2018-02-09 DIAGNOSIS — K219 Gastro-esophageal reflux disease without esophagitis: Secondary | ICD-10-CM | POA: Diagnosis not present

## 2018-02-09 DIAGNOSIS — M21372 Foot drop, left foot: Secondary | ICD-10-CM | POA: Diagnosis not present

## 2018-02-09 MED ORDER — ACETAMINOPHEN 500 MG PO TABS
1000.0000 mg | ORAL_TABLET | Freq: Once | ORAL | Status: AC
  Administered 2018-02-09: 1000 mg via ORAL

## 2018-02-09 MED ORDER — SODIUM CHLORIDE 0.9 % IV SOLN
Freq: Once | INTRAVENOUS | Status: AC
  Administered 2018-02-09: 11:00:00 via INTRAVENOUS
  Filled 2018-02-09: qty 1000

## 2018-02-09 MED ORDER — ACETAMINOPHEN 500 MG PO TABS
ORAL_TABLET | ORAL | Status: AC
  Filled 2018-02-09: qty 2

## 2018-02-09 MED ORDER — SODIUM CHLORIDE 0.9 % IV SOLN
300.0000 mg | Freq: Once | INTRAVENOUS | Status: AC
  Administered 2018-02-09: 300 mg via INTRAVENOUS
  Filled 2018-02-09: qty 15

## 2018-02-09 NOTE — Patient Instructions (Signed)

## 2018-02-09 NOTE — Progress Notes (Signed)
Patient states she has had nausea a couple of times.  Feels like she has salt taste in her mouth and then throws up a small amount of clear liquid.  Recently had baclofen pump placed (1-29).  She is still taking 10 mg Baclofen twice daily.  Pain in her legs today 6/10.

## 2018-02-17 ENCOUNTER — Encounter: Payer: Self-pay | Admitting: Neurology

## 2018-02-18 ENCOUNTER — Encounter: Payer: Self-pay | Admitting: Nurse Practitioner

## 2018-02-18 DIAGNOSIS — G35 Multiple sclerosis: Secondary | ICD-10-CM | POA: Diagnosis not present

## 2018-02-18 DIAGNOSIS — Z451 Encounter for adjustment and management of infusion pump: Secondary | ICD-10-CM | POA: Diagnosis not present

## 2018-02-18 DIAGNOSIS — R252 Cramp and spasm: Secondary | ICD-10-CM | POA: Diagnosis not present

## 2018-02-18 DIAGNOSIS — M79606 Pain in leg, unspecified: Secondary | ICD-10-CM | POA: Diagnosis not present

## 2018-02-22 ENCOUNTER — Encounter: Payer: Self-pay | Admitting: Nurse Practitioner

## 2018-02-25 DIAGNOSIS — F331 Major depressive disorder, recurrent, moderate: Secondary | ICD-10-CM | POA: Diagnosis not present

## 2018-03-03 ENCOUNTER — Encounter: Payer: Self-pay | Admitting: Internal Medicine

## 2018-03-03 ENCOUNTER — Ambulatory Visit (INDEPENDENT_AMBULATORY_CARE_PROVIDER_SITE_OTHER): Payer: Medicare Other | Admitting: Internal Medicine

## 2018-03-03 DIAGNOSIS — K5909 Other constipation: Secondary | ICD-10-CM

## 2018-03-03 DIAGNOSIS — Z1231 Encounter for screening mammogram for malignant neoplasm of breast: Secondary | ICD-10-CM | POA: Diagnosis not present

## 2018-03-03 DIAGNOSIS — R635 Abnormal weight gain: Secondary | ICD-10-CM | POA: Diagnosis not present

## 2018-03-03 DIAGNOSIS — J309 Allergic rhinitis, unspecified: Secondary | ICD-10-CM

## 2018-03-03 DIAGNOSIS — R300 Dysuria: Secondary | ICD-10-CM | POA: Diagnosis not present

## 2018-03-03 DIAGNOSIS — G35 Multiple sclerosis: Secondary | ICD-10-CM

## 2018-03-03 DIAGNOSIS — Z0001 Encounter for general adult medical examination with abnormal findings: Secondary | ICD-10-CM

## 2018-03-03 DIAGNOSIS — T50905A Adverse effect of unspecified drugs, medicaments and biological substances, initial encounter: Secondary | ICD-10-CM

## 2018-03-03 DIAGNOSIS — Z1239 Encounter for other screening for malignant neoplasm of breast: Secondary | ICD-10-CM

## 2018-03-03 MED ORDER — POLYETHYLENE GLYCOL POWD: 17.0000 g | Freq: Every day | 12 refills | Status: DC

## 2018-03-03 NOTE — Progress Notes (Signed)
[facility] [address] [place], [place] [address]  Internal MEDICINE  Office Visit Note  Patient Name: Rose Clements  [id number]  [id number]  Date of Service: 03/16/2018  Chief Complaint  Patient presents with  . Annual Exam     HPI Pt is here for routine health maintenance examination. She is concerned about her weight gain. Physical limitation due to MS.Does see neurology on a regular basis. Allergies and sinus congestion bothers her a lot. Takes all meds as prescribed.  Current Medication: Outpatient Encounter Medications as of 03/03/2018  Medication Sig Note  . aspirin 81 MG tablet Take 81 mg by mouth daily.   . baclofen (LIORESAL) 20 MG tablet Take 20 mg by mouth 2 (two) times daily.  02/09/2018: Now takes 10 mg twice daily  . calcium carbonate (OS-CAL) 600 MG TABS tablet Take 600 mg by mouth 2 (two) times daily with a meal.   . cetirizine (ZYRTEC) 10 MG tablet Take 10 mg by mouth daily.   . Cholecalciferol (VITAMIN D3) 1000 UNITS CAPS Take 2 capsules by mouth daily.    . DULoxetine (CYMBALTA) 30 MG capsule Take by mouth. Take 3 caps po daily   . fluticasone (FLONASE) 50 MCG/ACT nasal spray Place 2 sprays into both nostrils daily.   . natalizumab (TYSABRI) 300 MG/15ML injection Inject into the vein. 10/15/2015: Received from: [facility]  . pantoprazole (PROTONIX) 40 MG tablet  03/12/2016: Received from: External Pharmacy  . Polyethylene Glycol POWD Take 17 g by mouth daily.   Marland Kitchen tiZANidine (ZANAFLEX) 2 MG tablet Take 2 mg by mouth 3 (three) times daily.   . [DISCONTINUED] Polyethylene Glycol POWD Take 17 g by mouth daily.   . sennosides-docusate sodium (SENOKOT-S) 8.6-50 MG tablet Take 2 tablets by mouth 2 (two) times daily.   . [DISCONTINUED] DULoxetine (CYMBALTA) 60 MG capsule Take 1 capsule (60 mg total) by mouth daily. 03/03/2017: Now taking 90 mg.   No facility-administered encounter medications on file as of 03/03/2018.     Surgical History: Past  Surgical History:  Procedure Laterality Date  . ABDOMINAL HYSTERECTOMY  2003  . baclofen pump insertion Right 01/04/2018  . CARPAL TUNNEL RELEASE Right 1997  . CESAREAN SECTION  1989  . CYSTECTOMY  2002   From chest  . ESOPHAGOGASTRODUODENOSCOPY (EGD) WITH PROPOFOL N/A 05/19/2016   Procedure: ESOPHAGOGASTRODUODENOSCOPY (EGD) WITH PROPOFOL;  Surgeon: [name], MD;  Location: [facility] ENDOSCOPY;  Service: Endoscopy;  Laterality: N/A;  . KIDNEY DONATION Left 2002  . OVARIAN CYST REMOVAL Left 2002   Ovarian mass removal  . TONSILLECTOMY  1973  . UPPER GASTROINTESTINAL ENDOSCOPY  11/19/2014   Benign appearing esophageal stricture-Dilated.    Medical History: Past Medical History:  Diagnosis Date  . Constipation   . Cystitis bacillary, chronic   . Depression   . Dysphagia    Dx by Beverely Risen on 10/15/14  . Eosinophilic esophagitis 11/19/2014   GE junction benign stricture s/p balloon dilation, retained food contents. ?gastroparesis versus secondary to EE.  Marland Kitchen Esophageal dysphagia   . GERD (gastroesophageal reflux disease)   . Heartburn   . Hypertension   . Multilevel degenerative disc disease   . Multiple sclerosis (HCC)   . Multiple sclerosis (HCC) 07/16/2015    Family History: Family History  Problem Relation Age of Onset  . Diabetes Mother   . Hypertension Mother   . Heart disease Mother   . Pulmonary embolism Mother   . Diabetes Father   . Diabetes Sister   .  Hypertension Sister   . Heart disease Sister   . Bipolar disorder Sister   . Narcolepsy Brother   . Heart attack Maternal Grandmother   . Pancreatic cancer Maternal Grandfather     Review of Systems  Constitutional: Negative for chills, fatigue and unexpected weight change.  HENT: Positive for postnasal drip, sinus pressure, sinus pain and sneezing. Negative for congestion, rhinorrhea and sore throat.   Eyes: Positive for itching. Negative for redness.  Respiratory: Positive for shortness of breath. Negative for  cough and chest tightness.   Cardiovascular: Negative for chest pain and palpitations.  Gastrointestinal: Negative for abdominal pain, constipation, diarrhea, nausea and vomiting.  Genitourinary: Negative for dysuria and frequency.  Musculoskeletal: Positive for gait problem and myalgias. Negative for arthralgias, back pain, joint swelling and neck pain.  Skin: Negative for rash.  Allergic/Immunologic: Positive for environmental allergies and food allergies.  Neurological: Positive for weakness and numbness. Negative for tremors and speech difficulty.  Hematological: Negative for adenopathy. Does not bruise/bleed easily.  Psychiatric/Behavioral: Negative for behavioral problems (Depression), sleep disturbance and suicidal ideas. The patient is not nervous/anxious.    Vital Signs: BP 140/72 (BP Location: Left Arm, Patient Position: Sitting, Cuff Size: Normal)   Pulse 78   Resp 16   Ht 5\' 5"  (1.651 m)   Wt 233 lb 12.8 oz (106.1 kg)   SpO2 98%   BMI 38.91 kg/m   Physical Exam  Constitutional: She is oriented to person, place, and time. She appears well-developed and well-nourished. No distress.  HENT:  Head: Normocephalic and atraumatic.  Mouth/Throat: Oropharynx is clear and moist. No oropharyngeal exudate.  Eyes: Pupils are equal, round, and reactive to light. EOM are normal.  Neck: Normal range of motion. Neck supple. No JVD present. No tracheal deviation present. No thyromegaly present.  Cardiovascular: Normal rate, regular rhythm and normal heart sounds. Exam reveals no gallop and no friction rub.  No murmur heard. Pulmonary/Chest: Effort normal. No respiratory distress. She has no wheezes. She has no rales. She exhibits no tenderness. Right breast exhibits no inverted nipple. Left breast exhibits no inverted nipple. Breasts are symmetrical.  Abdominal: Soft. Bowel sounds are normal.  Musculoskeletal: Normal range of motion.  Lymphadenopathy:    She has no cervical adenopathy.   Neurological: She is alert and oriented to person, place, and time. No cranial nerve deficit. She exhibits normal muscle tone.  Skin: Skin is warm and dry. She is not diaphoretic.  Psychiatric: She has a normal mood and affect. Her behavior is normal. Judgment and thought content normal.     LABS: Recent Results (from the past 2160 hour(s))  Urinalysis, Routine w reflex microscopic     Status: Abnormal   Collection Time: 03/03/18  3:49 PM  Result Value Ref Range   Specific Gravity, UA 1.021 1.005 - 1.030   pH, UA =>9.0 (A) 5.0 - 7.5   Color, UA Yellow Yellow   Appearance Ur Clear Clear   Leukocytes, UA Trace (A) Negative   Protein, UA 1+ (A) Negative/Trace   Glucose, UA Negative Negative   Ketones, UA Negative Negative   RBC, UA Negative Negative   Bilirubin, UA Negative Negative   Urobilinogen, Ur 1.0 0.2 - 1.0 mg/dL   Nitrite, UA Negative Negative   Microscopic Examination See below:     Comment: Microscopic was indicated and was performed.  Microscopic Examination     Status: None   Collection Time: 03/03/18  3:49 PM  Result Value Ref Range   WBC,  UA 0-5 0 - 5 /hpf   RBC, UA 0-2 0 - 2 /hpf   Epithelial Cells (non renal) 0-10 0 - 10 /hpf   Casts None seen None seen /lpf   Mucus, UA Present Not Estab.   Bacteria, UA Few None seen/Few   Assessment/Plan: 1. Encounter for general adult medical examination with abnormal findings - Updated Labs and mammogram.  2. Multiple sclerosis (HCC) - Per neurology - Comprehensive metabolic panel  3. Breast screening - MM DIGITAL SCREENING BILATERAL; Future  4. Weight gain due to medication - Water therapy is recommended for her - DULoxetine (CYMBALTA) 30 MG capsule; Take by mouth. Take 3 caps po daily  5. Other constipation - CBC with Differential/Platelet - TSH - T4, free - Polyethylene Glycol POWD; Take 17 g by mouth daily.  Dispense: 527 g; Refill: 12  6. Allergic rhinitis, unspecified seasonality, unspecified trigger -  Allergy Test  7. Dysuria - Urinalysis, Routine w reflex microscopic - Microscopic Examination  General Counseling: Lusia verbalizes understanding of the findings of todays visit and agrees with plan of treatment. I have discussed any further diagnostic evaluation that may be needed or ordered today. We also reviewed her medications today. she has been encouraged to call the office with any questions or concerns that should arise related to todays visit.   Orders Placed This Encounter  Procedures  . Allergy Test  . Microscopic Examination  . MM DIGITAL SCREENING BILATERAL  . Pneumococcal conjugate vaccine 13-valent  . Urinalysis, Routine w reflex microscopic  . CBC with Differential/Platelet  . Lipid Panel With LDL/HDL Ratio  . TSH  . T4, free  . Comprehensive metabolic panel    Meds ordered this encounter  Medications  . Polyethylene Glycol POWD    Sig: Take 17 g by mouth daily.    Dispense:  527 g    Refill:  12    Time spent:25 Minutes   Lyndon Code, MD  Internal Medicine

## 2018-03-04 LAB — URINALYSIS, ROUTINE W REFLEX MICROSCOPIC
BILIRUBIN UA: NEGATIVE
GLUCOSE, UA: NEGATIVE
KETONES UA: NEGATIVE
NITRITE UA: NEGATIVE
RBC UA: NEGATIVE
SPEC GRAV UA: 1.021 (ref 1.005–1.030)
Urobilinogen, Ur: 1 mg/dL (ref 0.2–1.0)
pH, UA: >=9 — AB (ref 5.0–7.5)

## 2018-03-04 LAB — MICROSCOPIC EXAMINATION: Casts: NONE SEEN /lpf

## 2018-03-07 DIAGNOSIS — Z79899 Other long term (current) drug therapy: Secondary | ICD-10-CM | POA: Diagnosis not present

## 2018-03-07 DIAGNOSIS — G35 Multiple sclerosis: Secondary | ICD-10-CM | POA: Diagnosis not present

## 2018-03-09 ENCOUNTER — Inpatient Hospital Stay: Payer: Medicare Other | Attending: Hematology and Oncology

## 2018-03-09 VITALS — BP 149/78 | HR 58 | Temp 98.1°F | Resp 18 | Wt 233.0 lb

## 2018-03-09 DIAGNOSIS — Z7982 Long term (current) use of aspirin: Secondary | ICD-10-CM | POA: Diagnosis not present

## 2018-03-09 DIAGNOSIS — M21372 Foot drop, left foot: Secondary | ICD-10-CM | POA: Insufficient documentation

## 2018-03-09 DIAGNOSIS — G35 Multiple sclerosis: Secondary | ICD-10-CM | POA: Diagnosis present

## 2018-03-09 DIAGNOSIS — I10 Essential (primary) hypertension: Secondary | ICD-10-CM | POA: Diagnosis present

## 2018-03-09 DIAGNOSIS — K219 Gastro-esophageal reflux disease without esophagitis: Secondary | ICD-10-CM | POA: Diagnosis not present

## 2018-03-09 DIAGNOSIS — Z79899 Other long term (current) drug therapy: Secondary | ICD-10-CM | POA: Insufficient documentation

## 2018-03-09 DIAGNOSIS — F329 Major depressive disorder, single episode, unspecified: Secondary | ICD-10-CM | POA: Diagnosis not present

## 2018-03-09 MED ORDER — SODIUM CHLORIDE 0.9 % IV SOLN
Freq: Once | INTRAVENOUS | Status: AC
  Administered 2018-03-09: 10:00:00 via INTRAVENOUS
  Filled 2018-03-09: qty 1000

## 2018-03-09 MED ORDER — ACETAMINOPHEN 500 MG PO TABS
1000.0000 mg | ORAL_TABLET | Freq: Once | ORAL | Status: AC
  Administered 2018-03-09: 1000 mg via ORAL

## 2018-03-09 MED ORDER — ACETAMINOPHEN 500 MG PO TABS
ORAL_TABLET | ORAL | Status: AC
  Filled 2018-03-09: qty 2

## 2018-03-09 MED ORDER — SODIUM CHLORIDE 0.9 % IV SOLN
300.0000 mg | Freq: Once | INTRAVENOUS | Status: AC
  Administered 2018-03-09: 300 mg via INTRAVENOUS
  Filled 2018-03-09: qty 15

## 2018-03-21 DIAGNOSIS — Z451 Encounter for adjustment and management of infusion pump: Secondary | ICD-10-CM | POA: Diagnosis not present

## 2018-03-21 DIAGNOSIS — R252 Cramp and spasm: Secondary | ICD-10-CM | POA: Diagnosis not present

## 2018-03-21 DIAGNOSIS — M542 Cervicalgia: Secondary | ICD-10-CM | POA: Diagnosis not present

## 2018-03-23 ENCOUNTER — Emergency Department
Admission: EM | Admit: 2018-03-23 | Discharge: 2018-03-23 | Disposition: A | Discharge status: Home / Self Care | Payer: No Typology Code available for payment source | Attending: Emergency Medicine | Admitting: Emergency Medicine

## 2018-03-23 ENCOUNTER — Emergency Department: Payer: No Typology Code available for payment source

## 2018-03-23 ENCOUNTER — Other Ambulatory Visit: Payer: Self-pay

## 2018-03-23 ENCOUNTER — Encounter: Payer: Self-pay | Admitting: Emergency Medicine

## 2018-03-23 DIAGNOSIS — Z7982 Long term (current) use of aspirin: Secondary | ICD-10-CM | POA: Diagnosis not present

## 2018-03-23 DIAGNOSIS — Z79899 Other long term (current) drug therapy: Secondary | ICD-10-CM | POA: Insufficient documentation

## 2018-03-23 DIAGNOSIS — G35 Multiple sclerosis: Secondary | ICD-10-CM | POA: Insufficient documentation

## 2018-03-23 DIAGNOSIS — S161XXA Strain of muscle, fascia and tendon at neck level, initial encounter: Secondary | ICD-10-CM | POA: Diagnosis not present

## 2018-03-23 DIAGNOSIS — Y9389 Activity, other specified: Secondary | ICD-10-CM | POA: Diagnosis not present

## 2018-03-23 DIAGNOSIS — M542 Cervicalgia: Secondary | ICD-10-CM | POA: Diagnosis not present

## 2018-03-23 DIAGNOSIS — S199XXA Unspecified injury of neck, initial encounter: Secondary | ICD-10-CM | POA: Diagnosis not present

## 2018-03-23 DIAGNOSIS — Y999 Unspecified external cause status: Secondary | ICD-10-CM | POA: Diagnosis not present

## 2018-03-23 DIAGNOSIS — Y92414 Local residential or business street as the place of occurrence of the external cause: Secondary | ICD-10-CM | POA: Insufficient documentation

## 2018-03-23 DIAGNOSIS — S1980XA Other specified injuries of unspecified part of neck, initial encounter: Secondary | ICD-10-CM | POA: Diagnosis present

## 2018-03-23 MED ORDER — HYDROCODONE-ACETAMINOPHEN 5-325 MG PO TABS: 1.0000 | ORAL_TABLET | Freq: Four times a day (QID) | ORAL | 0 refills | Status: DC | PRN

## 2018-03-23 NOTE — Discharge Instructions (Addendum)
Follow-up with your regular doctor if you are not better in 5-7 days.  Use the pain medication as needed.  Apply ice to any areas that hurt.  In 3 days use wet heat followed by ice.  If you are worsening please return the emergency department
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
## 2018-03-31 ENCOUNTER — Other Ambulatory Visit: Payer: Self-pay | Admitting: Internal Medicine

## 2018-03-31 MED ORDER — PANTOPRAZOLE SODIUM 40 MG PO TBEC: 40.0000 mg | DELAYED_RELEASE_TABLET | Freq: Every day | ORAL | 3 refills | Status: DC

## 2018-04-06 ENCOUNTER — Inpatient Hospital Stay: Payer: Medicare Other | Attending: Hematology and Oncology

## 2018-04-06 VITALS — BP 137/87 | HR 62 | Temp 97.3°F | Resp 20

## 2018-04-06 DIAGNOSIS — Z79899 Other long term (current) drug therapy: Secondary | ICD-10-CM | POA: Insufficient documentation

## 2018-04-06 DIAGNOSIS — I10 Essential (primary) hypertension: Secondary | ICD-10-CM | POA: Insufficient documentation

## 2018-04-06 DIAGNOSIS — G35 Multiple sclerosis: Secondary | ICD-10-CM | POA: Diagnosis not present

## 2018-04-06 MED ORDER — SODIUM CHLORIDE 0.9 % IV SOLN
Freq: Once | INTRAVENOUS | Status: AC
Start: 2018-04-06 — End: 2018-04-06
  Administered 2018-04-06: 10:00:00 via INTRAVENOUS
  Filled 2018-04-06: qty 1000

## 2018-04-06 MED ORDER — ACETAMINOPHEN 500 MG PO TABS
1000.0000 mg | ORAL_TABLET | Freq: Once | ORAL | Status: AC
  Administered 2018-04-06: 1000 mg via ORAL
  Filled 2018-04-06: qty 2

## 2018-04-06 MED ORDER — SODIUM CHLORIDE 0.9 % IV SOLN
300.0000 mg | Freq: Once | INTRAVENOUS | Status: AC
  Administered 2018-04-06: 300 mg via INTRAVENOUS
  Filled 2018-04-06: qty 15

## 2018-04-06 NOTE — Patient Instructions (Signed)

## 2018-04-08 DIAGNOSIS — F331 Major depressive disorder, recurrent, moderate: Secondary | ICD-10-CM | POA: Diagnosis not present

## 2018-04-27 ENCOUNTER — Other Ambulatory Visit: Payer: Self-pay | Admitting: Nurse Practitioner

## 2018-04-27 MED ORDER — FLUTICASONE PROPIONATE 50 MCG/ACT NA SUSP: 2.0000 | Freq: Every day | NASAL | 2 refills | Status: DC

## 2018-04-28 DIAGNOSIS — Z79899 Other long term (current) drug therapy: Secondary | ICD-10-CM | POA: Diagnosis not present

## 2018-04-28 DIAGNOSIS — R252 Cramp and spasm: Secondary | ICD-10-CM | POA: Diagnosis not present

## 2018-05-04 ENCOUNTER — Inpatient Hospital Stay: Payer: Medicare Other

## 2018-05-04 VITALS — BP 143/83 | HR 67 | Resp 20

## 2018-05-04 DIAGNOSIS — I10 Essential (primary) hypertension: Secondary | ICD-10-CM | POA: Diagnosis not present

## 2018-05-04 DIAGNOSIS — G35 Multiple sclerosis: Secondary | ICD-10-CM

## 2018-05-04 DIAGNOSIS — Z79899 Other long term (current) drug therapy: Secondary | ICD-10-CM | POA: Diagnosis not present

## 2018-05-04 MED ORDER — SODIUM CHLORIDE 0.9 % IV SOLN
300.0000 mg | Freq: Once | INTRAVENOUS | Status: AC
  Administered 2018-05-04: 300 mg via INTRAVENOUS
  Filled 2018-05-04: qty 15

## 2018-05-04 MED ORDER — SODIUM CHLORIDE 0.9 % IV SOLN
Freq: Once | INTRAVENOUS | Status: AC
  Administered 2018-05-04: 10:00:00 via INTRAVENOUS
  Filled 2018-05-04: qty 1000

## 2018-05-04 MED ORDER — ACETAMINOPHEN 500 MG PO TABS
1000.0000 mg | ORAL_TABLET | Freq: Once | ORAL | Status: AC
  Administered 2018-05-04: 1000 mg via ORAL
  Filled 2018-05-04: qty 2

## 2018-05-04 NOTE — Patient Instructions (Signed)

## 2018-05-05 DIAGNOSIS — Z79899 Other long term (current) drug therapy: Secondary | ICD-10-CM | POA: Diagnosis not present

## 2018-05-05 DIAGNOSIS — R269 Unspecified abnormalities of gait and mobility: Secondary | ICD-10-CM | POA: Diagnosis not present

## 2018-05-05 DIAGNOSIS — G35 Multiple sclerosis: Secondary | ICD-10-CM | POA: Diagnosis not present

## 2018-05-05 DIAGNOSIS — R252 Cramp and spasm: Secondary | ICD-10-CM | POA: Diagnosis not present

## 2018-05-05 DIAGNOSIS — R39198 Other difficulties with micturition: Secondary | ICD-10-CM | POA: Diagnosis not present

## 2018-05-05 DIAGNOSIS — M6281 Muscle weakness (generalized): Secondary | ICD-10-CM | POA: Diagnosis not present

## 2018-05-15 DIAGNOSIS — R252 Cramp and spasm: Secondary | ICD-10-CM | POA: Diagnosis not present

## 2018-05-15 DIAGNOSIS — G35 Multiple sclerosis: Secondary | ICD-10-CM | POA: Diagnosis not present

## 2018-05-27 DIAGNOSIS — M545 Low back pain: Secondary | ICD-10-CM | POA: Diagnosis not present

## 2018-05-27 DIAGNOSIS — M79662 Pain in left lower leg: Secondary | ICD-10-CM | POA: Diagnosis not present

## 2018-05-27 DIAGNOSIS — M79661 Pain in right lower leg: Secondary | ICD-10-CM | POA: Diagnosis not present

## 2018-05-27 DIAGNOSIS — R252 Cramp and spasm: Secondary | ICD-10-CM | POA: Diagnosis not present

## 2018-05-27 DIAGNOSIS — M546 Pain in thoracic spine: Secondary | ICD-10-CM | POA: Diagnosis not present

## 2018-05-27 DIAGNOSIS — Z79899 Other long term (current) drug therapy: Secondary | ICD-10-CM | POA: Diagnosis not present

## 2018-06-01 ENCOUNTER — Inpatient Hospital Stay: Payer: Medicare Other | Attending: Hematology and Oncology

## 2018-06-01 VITALS — BP 137/85 | HR 67 | Temp 97.0°F | Resp 18

## 2018-06-01 DIAGNOSIS — G35 Multiple sclerosis: Secondary | ICD-10-CM | POA: Diagnosis not present

## 2018-06-01 DIAGNOSIS — Z79899 Other long term (current) drug therapy: Secondary | ICD-10-CM | POA: Diagnosis not present

## 2018-06-01 MED ORDER — ACETAMINOPHEN 500 MG PO TABS
1000.0000 mg | ORAL_TABLET | Freq: Once | ORAL | Status: AC
  Administered 2018-06-01: 1000 mg via ORAL
  Filled 2018-06-01: qty 2

## 2018-06-01 MED ORDER — SODIUM CHLORIDE 0.9 % IV SOLN
Freq: Once | INTRAVENOUS | Status: AC
  Administered 2018-06-01: 10:00:00 via INTRAVENOUS
  Filled 2018-06-01: qty 1000

## 2018-06-01 MED ORDER — SODIUM CHLORIDE 0.9 % IV SOLN
300.0000 mg | Freq: Once | INTRAVENOUS | Status: AC
  Administered 2018-06-01: 300 mg via INTRAVENOUS
  Filled 2018-06-01: qty 15

## 2018-06-01 NOTE — Patient Instructions (Signed)

## 2018-06-22 ENCOUNTER — Ambulatory Visit (INDEPENDENT_AMBULATORY_CARE_PROVIDER_SITE_OTHER): Payer: Medicare Other | Admitting: Adult Health

## 2018-06-22 ENCOUNTER — Encounter: Payer: Self-pay | Admitting: Adult Health

## 2018-06-22 VITALS — BP 158/92 | HR 78 | Resp 16 | Ht 65.0 in | Wt 236.4 lb

## 2018-06-22 DIAGNOSIS — G35 Multiple sclerosis: Secondary | ICD-10-CM

## 2018-06-22 DIAGNOSIS — R635 Abnormal weight gain: Secondary | ICD-10-CM

## 2018-06-22 DIAGNOSIS — J309 Allergic rhinitis, unspecified: Secondary | ICD-10-CM | POA: Diagnosis not present

## 2018-06-22 DIAGNOSIS — K219 Gastro-esophageal reflux disease without esophagitis: Secondary | ICD-10-CM | POA: Diagnosis not present

## 2018-06-22 DIAGNOSIS — K5909 Other constipation: Secondary | ICD-10-CM | POA: Diagnosis not present

## 2018-06-22 DIAGNOSIS — I10 Essential (primary) hypertension: Secondary | ICD-10-CM

## 2018-06-22 DIAGNOSIS — T50905A Adverse effect of unspecified drugs, medicaments and biological substances, initial encounter: Secondary | ICD-10-CM

## 2018-06-22 NOTE — Progress Notes (Signed)
[facility] [address] [place], [place] [address]  Internal MEDICINE  Office Visit Note  Patient Name: Rose Clements  [id number]  [id number]  Date of Service: 06/22/2018  Chief Complaint  Patient presents with  . Gastroesophageal Reflux  . Multiple Sclerosis  . Hypertension    HPI Pt here for follow up.  She was interested in bariatric surgery. She reports she has been taking her Cymbalta as prescribed.  She states she feels like that is helping her.  She reports some issues with reflux.  She has been taking her Protonix as prescribed as well. She did not her her blood work done, but will get it done today since she has not eaten.     Current Medication: Outpatient Encounter Medications as of 06/22/2018  Medication Sig Note  . baclofen (LIORESAL) 20 MG tablet Take 20 mg by mouth 2 (two) times daily.  02/09/2018: Now takes 10 mg twice daily  . cetirizine (ZYRTEC) 10 MG tablet Take 10 mg by mouth daily.   . Cholecalciferol (VITAMIN D3) 1000 UNITS CAPS Take 2 capsules by mouth daily.    . DULoxetine (CYMBALTA) 30 MG capsule Take by mouth. Take 3 caps po daily   . fluticasone (FLONASE) 50 MCG/ACT nasal spray Place 2 sprays into both nostrils daily.   . natalizumab (TYSABRI) 300 MG/15ML injection Inject into the vein. 10/15/2015: Received from: [facility]  . pantoprazole (PROTONIX) 40 MG tablet Take 1 tablet (40 mg total) by mouth daily.   . Polyethylene Glycol POWD Take 17 g by mouth daily.   . [DISCONTINUED] aspirin 81 MG tablet Take 81 mg by mouth daily.   . [DISCONTINUED] calcium carbonate (OS-CAL) 600 MG TABS tablet Take 600 mg by mouth 2 (two) times daily with a meal.   . [DISCONTINUED] HYDROcodone-acetaminophen (NORCO/VICODIN) 5-325 MG tablet Take 1 tablet by mouth every 6 (six) hours as needed for moderate pain.   . [DISCONTINUED] sennosides-docusate sodium (SENOKOT-S) 8.6-50 MG tablet Take 2 tablets by mouth 2 (two) times daily.   . [DISCONTINUED] tiZANidine  (ZANAFLEX) 2 MG tablet Take 2 mg by mouth 3 (three) times daily.    No facility-administered encounter medications on file as of 06/22/2018.     Surgical History: Past Surgical History:  Procedure Laterality Date  . ABDOMINAL HYSTERECTOMY  2003  . baclofen pump insertion Right 01/04/2018  . CARPAL TUNNEL RELEASE Right 1997  . CESAREAN SECTION  1989  . CYSTECTOMY  2002   From chest  . ESOPHAGOGASTRODUODENOSCOPY (EGD) WITH PROPOFOL N/A 05/19/2016   Procedure: ESOPHAGOGASTRODUODENOSCOPY (EGD) WITH PROPOFOL;  Surgeon: [name], MD;  Location: [facility] ENDOSCOPY;  Service: Endoscopy;  Laterality: N/A;  . KIDNEY DONATION Left 2002  . OVARIAN CYST REMOVAL Left 2002   Ovarian mass removal  . TONSILLECTOMY  1973  . UPPER GASTROINTESTINAL ENDOSCOPY  11/19/2014   Benign appearing esophageal stricture-Dilated.    Medical History: Past Medical History:  Diagnosis Date  . Constipation   . Cystitis bacillary, chronic   . Depression   . Dysphagia    Dx by Beverely Risen on 10/15/14  . Eosinophilic esophagitis 11/19/2014   GE junction benign stricture s/p balloon dilation, retained food contents. ?gastroparesis versus secondary to EE.  Marland Kitchen Esophageal dysphagia   . GERD (gastroesophageal reflux disease)   . Heartburn   . Hypertension   . Multilevel degenerative disc disease   . Multiple sclerosis (HCC)   . Multiple sclerosis (HCC) 07/16/2015    Family History: Family History  Problem  Relation Age of Onset  . Diabetes Mother   . Hypertension Mother   . Heart disease Mother   . Pulmonary embolism Mother   . Diabetes Father   . Diabetes Sister   . Hypertension Sister   . Heart disease Sister   . Bipolar disorder Sister   . Narcolepsy Brother   . Heart attack Maternal Grandmother   . Pancreatic cancer Maternal Grandfather     Social History   Socioeconomic History  . Marital status: Married    Spouse name: Not on file  . Number of children: Not on file  . Years of education: Not on  file  . Highest education level: Not on file  Occupational History  . Not on file  Social Needs  . Financial resource strain: Not on file  . Food insecurity:    Worry: Not on file    Inability: Not on file  . Transportation needs:    Medical: Not on file    Non-medical: Not on file  Tobacco Use  . Smoking status: Never Smoker  . Smokeless tobacco: Never Used  Substance and Sexual Activity  . Alcohol use: No    Alcohol/week: 0.0 oz  . Drug use: No  . Sexual activity: Not Currently  Lifestyle  . Physical activity:    Days per week: Not on file    Minutes per session: Not on file  . Stress: Not on file  Relationships  . Social connections:    Talks on phone: Not on file    Gets together: Not on file    Attends religious service: Not on file    Active member of club or organization: Not on file    Attends meetings of clubs or organizations: Not on file    Relationship status: Not on file  . Intimate partner violence:    Fear of current or ex partner: Not on file    Emotionally abused: Not on file    Physically abused: Not on file    Forced sexual activity: Not on file  Other Topics Concern  . Not on file  Social History Narrative  . Not on file   Review of Systems  Constitutional: Negative for chills, fatigue and unexpected weight change.  HENT: Negative for congestion, rhinorrhea, sneezing and sore throat.   Eyes: Negative for photophobia, pain and redness.  Respiratory: Negative for cough, chest tightness and shortness of breath.   Cardiovascular: Negative for chest pain and palpitations.  Gastrointestinal: Negative for abdominal pain, constipation, diarrhea, nausea and vomiting.  Endocrine: Negative.   Genitourinary: Negative for dysuria and frequency.  Musculoskeletal: Negative for arthralgias, back pain, joint swelling and neck pain.  Skin: Negative for rash.  Allergic/Immunologic: Negative.   Neurological: Negative for tremors and numbness.  Hematological:  Negative for adenopathy. Does not bruise/bleed easily.  Psychiatric/Behavioral: Negative for behavioral problems and sleep disturbance. The patient is not nervous/anxious.    Vital Signs: BP (!) 158/92   Pulse 78   Resp 16   Ht 5\' 5"  (1.651 m)   Wt 236 lb 6.4 oz (107.2 kg)   SpO2 98%   BMI 39.34 kg/m   Physical Exam  Constitutional: She is oriented to person, place, and time. She appears well-developed and well-nourished. No distress.  HENT:  Head: Normocephalic and atraumatic.  Mouth/Throat: Oropharynx is clear and moist. No oropharyngeal exudate.  Eyes: Pupils are equal, round, and reactive to light. EOM are normal.  Neck: Normal range of motion. Neck supple. No JVD  present. No tracheal deviation present. No thyromegaly present.  Cardiovascular: Normal rate, regular rhythm and normal heart sounds. Exam reveals no gallop and no friction rub.  No murmur heard. Pulmonary/Chest: Effort normal and breath sounds normal. No respiratory distress. She has no wheezes. She has no rales. She exhibits no tenderness.  Abdominal: Soft. There is no tenderness. There is no guarding.  Musculoskeletal: Normal range of motion.  Lymphadenopathy:    She has no cervical adenopathy.  Neurological: She is alert and oriented to person, place, and time. No cranial nerve deficit.  Skin: Skin is warm and dry. She is not diaphoretic.  Psychiatric: She has a normal mood and affect. Her behavior is normal. Judgment and thought content normal.  Nursing note and vitals reviewed.  Assessment/Plan: 1. Gastroesophageal reflux disease, esophagitis presence not specified Continue prilosec.  Obesity Counseling: Risk Assessment: An assessment of behavioral risk factors was made today and includes lack of exercise sedentary lifestyle, lack of portion control and poor dietary habits.  Risk Modification Advice: She was counseled on portion control guidelines. Restricting daily caloric intake to. . The detrimental long  term effects of obesity on her health and ongoing poor compliance was also discussed with the patient.  2. Multiple sclerosis (HCC) Pt has Baclofen intrathecal pump. Continue to follow up with neurology/pain mgmt.    3. Hypertension, unspecified type Take BP at home, and bring log for next visit.   4. Weight gain due to medication Continue to stationary bike.  Try to increase to 30 minutes a day.    There is a liability release in patients' chart. There has been a 10 minute discussion about the side effects including but not limited to elevated blood pressure, anxiety, lack of sleep and dry mouth. Pt understands and will like to start/continue on appetite suppressant at this time. There will be one month RX given at the time of visit with proper follow up. Nova diet plan with restricted calories is given to the pt. Pt understands and agrees with  plan of treatment  General Counseling: Cheron verbalizes understanding of the findings of todays visit and agrees with plan of treatment. I have discussed any further diagnostic evaluation that may be needed or ordered today. We also reviewed her medications today. she has been encouraged to call the office with any questions or concerns that should arise related to todays visit.   Time spent: 25 Minutes   This patient was seen by Blima Ledger AGNP-C in Collaboration with Dr Lyndon Code as a part of collaborative care agreement    Dr Lyndon Code Internal medicine

## 2018-06-23 LAB — COMPREHENSIVE METABOLIC PANEL
A/G RATIO: 1.7 (ref 1.2–2.2)
ALT: 7 IU/L (ref 0–32)
AST: 16 IU/L (ref 0–40)
Albumin: 4.3 g/dL (ref 3.5–5.5)
Alkaline Phosphatase: 87 IU/L (ref 39–117)
BILIRUBIN TOTAL: 0.5 mg/dL (ref 0.0–1.2)
BUN/Creatinine Ratio: 11 (ref 9–23)
BUN: 10 mg/dL (ref 6–24)
CALCIUM: 9.3 mg/dL (ref 8.7–10.2)
CHLORIDE: 100 mmol/L (ref 96–106)
CO2: 21 mmol/L (ref 20–29)
Creatinine, Ser: 0.9 mg/dL (ref 0.57–1.00)
GFR, EST AFRICAN AMERICAN: 83 mL/min/{1.73_m2} (ref >59)
GFR, EST NON AFRICAN AMERICAN: 72 mL/min/{1.73_m2} (ref >59)
GLOBULIN, TOTAL: 2.5 g/dL (ref 1.5–4.5)
Glucose: 82 mg/dL (ref 65–99)
POTASSIUM: 4.2 mmol/L (ref 3.5–5.2)
SODIUM: 139 mmol/L (ref 134–144)
Total Protein: 6.8 g/dL (ref 6.0–8.5)

## 2018-06-23 LAB — LIPID PANEL WITH LDL/HDL RATIO
CHOLESTEROL TOTAL: 236 mg/dL — AB (ref 100–199)
HDL: 69 mg/dL (ref >39)
LDL CALC: 146 mg/dL — AB (ref 0–99)
LDl/HDL Ratio: 2.1 ratio (ref 0.0–3.2)
Triglycerides: 103 mg/dL (ref 0–149)
VLDL CHOLESTEROL CAL: 21 mg/dL (ref 5–40)

## 2018-06-23 LAB — CBC WITH DIFFERENTIAL/PLATELET

## 2018-06-23 LAB — T4, FREE: Free T4: 1.15 ng/dL (ref 0.82–1.77)

## 2018-06-23 LAB — TSH: TSH: 1.59 u[IU]/mL (ref 0.450–4.500)

## 2018-06-29 ENCOUNTER — Inpatient Hospital Stay: Payer: Medicare Other | Attending: Hematology and Oncology

## 2018-06-29 VITALS — BP 127/81 | HR 65 | Temp 96.8°F | Resp 16

## 2018-06-29 DIAGNOSIS — G35 Multiple sclerosis: Secondary | ICD-10-CM | POA: Insufficient documentation

## 2018-06-29 MED ORDER — SODIUM CHLORIDE 0.9 % IV SOLN
Freq: Once | INTRAVENOUS | Status: AC
  Administered 2018-06-29: 10:00:00 via INTRAVENOUS
  Filled 2018-06-29: qty 1000

## 2018-06-29 MED ORDER — SODIUM CHLORIDE 0.9 % IV SOLN
300.0000 mg | Freq: Once | INTRAVENOUS | Status: AC
  Administered 2018-06-29: 300 mg via INTRAVENOUS
  Filled 2018-06-29: qty 15

## 2018-06-29 MED ORDER — ACETAMINOPHEN 500 MG PO TABS
1000.0000 mg | ORAL_TABLET | Freq: Once | ORAL | Status: AC
  Administered 2018-06-29: 1000 mg via ORAL

## 2018-06-29 NOTE — Patient Instructions (Signed)

## 2018-07-05 ENCOUNTER — Ambulatory Visit: Payer: Self-pay | Admitting: Internal Medicine

## 2018-07-27 ENCOUNTER — Ambulatory Visit: Payer: Self-pay

## 2018-08-01 ENCOUNTER — Other Ambulatory Visit: Payer: Self-pay

## 2018-08-01 MED ORDER — FLUTICASONE PROPIONATE 50 MCG/ACT NA SUSP: 2.0000 | Freq: Every day | NASAL | 2 refills | Status: DC

## 2018-08-24 ENCOUNTER — Inpatient Hospital Stay: Payer: PRIVATE HEALTH INSURANCE | Admitting: Hematology and Oncology

## 2018-08-24 ENCOUNTER — Other Ambulatory Visit: Payer: Self-pay | Admitting: Hematology and Oncology

## 2018-08-24 ENCOUNTER — Inpatient Hospital Stay: Payer: PRIVATE HEALTH INSURANCE
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
## 2018-08-25 ENCOUNTER — Ambulatory Visit: Payer: Medicare Other | Admitting: Adult Health

## 2018-08-25 ENCOUNTER — Encounter: Payer: Self-pay | Admitting: Adult Health

## 2018-08-25 VITALS — BP 140/80 | HR 78 | Resp 16 | Ht 65.0 in | Wt 222.0 lb

## 2018-08-25 DIAGNOSIS — R51 Headache: Secondary | ICD-10-CM | POA: Diagnosis not present

## 2018-08-25 DIAGNOSIS — K219 Gastro-esophageal reflux disease without esophagitis: Secondary | ICD-10-CM

## 2018-08-25 DIAGNOSIS — G35 Multiple sclerosis: Secondary | ICD-10-CM | POA: Diagnosis not present

## 2018-08-25 DIAGNOSIS — R519 Headache, unspecified: Secondary | ICD-10-CM

## 2018-08-25 DIAGNOSIS — I10 Essential (primary) hypertension: Secondary | ICD-10-CM | POA: Diagnosis not present

## 2018-08-25 MED ORDER — LISINOPRIL 5 MG PO TABS: 5.0000 mg | ORAL_TABLET | Freq: Every day | ORAL | 3 refills | Status: DC

## 2018-08-25 NOTE — Progress Notes (Signed)
[facility] [address] [place], [place] [address]  Internal MEDICINE  Office Visit Note  Patient Name: Rose Clements  [id number]  [id number]  Date of Service: 09/01/2018  Chief Complaint  Patient presents with  . Hypertension  . Depression  . Gastroesophageal Reflux  . Headache    HPI Pt here for follow up HTN, Depression, GERD and headache.  Since last visit she continues to reports intermittent headaches.  The worst being on the left side of her face and head.  She describes it as a sinus pressure headache.  She says she has no concurrent symptoms, she denies fever, blurred vision or neuro changes.  She did reports one episode of nausea, without vomiting.  She has a history of MS, and she missed her last infusion, which could be contributing to her headaches. She has been logging her BP, a few have been 120/84, 115/74, 134/90, and 139/93.  This were all first thing in the morning.  She reports a few episodes of GERD, but mostly when she has eaten late at night, and layed down. She continues to take Cymbalta for depression, and reports good results.     Current Medication: Outpatient Encounter Medications as of 08/25/2018  Medication Sig Note  . baclofen (LIORESAL) 20 MG tablet Take 20 mg by mouth 2 (two) times daily.  02/09/2018: Now takes 10 mg twice daily  . cetirizine (ZYRTEC) 10 MG tablet Take 10 mg by mouth daily.   . Cholecalciferol (VITAMIN D3) 1000 UNITS CAPS Take 2 capsules by mouth daily.    . DULoxetine (CYMBALTA) 30 MG capsule Take by mouth. Take 3 caps po daily   . fluticasone (FLONASE) 50 MCG/ACT nasal spray Place 2 sprays into both nostrils daily.   . natalizumab (TYSABRI) 300 MG/15ML injection Inject into the vein. 10/15/2015: [facility] from: [facility]  . pantoprazole (PROTONIX) 40 MG tablet Take 1 tablet (40 mg total) by mouth daily.   . Polyethylene Glycol POWD Take 17 g by mouth daily.   Marland Kitchen lisinopril (PRINIVIL,ZESTRIL) 5 MG tablet Take 1 tablet (5 mg  total) by mouth daily.    No facility-administered encounter medications on file as of 08/25/2018.     Surgical History: Past Surgical History:  Procedure Laterality Date  . ABDOMINAL HYSTERECTOMY  2003  . baclofen pump insertion Right 01/04/2018  . CARPAL TUNNEL RELEASE Right 1997  . CESAREAN SECTION  1989  . CYSTECTOMY  2002   From chest  . ESOPHAGOGASTRODUODENOSCOPY (EGD) WITH PROPOFOL N/A 05/19/2016   Procedure: ESOPHAGOGASTRODUODENOSCOPY (EGD) WITH PROPOFOL;  Surgeon: [name], MD;  Location: [facility] ENDOSCOPY;  Service: Endoscopy;  Laterality: N/A;  . KIDNEY DONATION Left 2002  . OVARIAN CYST REMOVAL Left 2002   Ovarian mass removal  . TONSILLECTOMY  1973  . UPPER GASTROINTESTINAL ENDOSCOPY  11/19/2014   Benign appearing esophageal stricture-Dilated.    Medical History: Past Medical History:  Diagnosis Date  . Constipation   . Cystitis bacillary, chronic   . Depression   . Dysphagia    Dx by Beverely Risen on 10/15/14  . Eosinophilic esophagitis 11/19/2014   GE junction benign stricture s/p balloon dilation, retained food contents. ?gastroparesis versus secondary to EE.  Marland Kitchen Esophageal dysphagia   . GERD (gastroesophageal reflux disease)   . Heartburn   . Hypertension   . Multilevel degenerative disc disease   . Multiple sclerosis (HCC)   . Multiple sclerosis (HCC) 07/16/2015    Family History: Family History  Problem Relation Age of Onset  .  Diabetes Mother   . Hypertension Mother   . Heart disease Mother   . Pulmonary embolism Mother   . Diabetes Father   . Diabetes Sister   . Hypertension Sister   . Heart disease Sister   . Bipolar disorder Sister   . Narcolepsy Brother   . Heart attack Maternal Grandmother   . Pancreatic cancer Maternal Grandfather     Social History   Socioeconomic History  . Marital status: Married    Spouse name: Not on file  . Number of children: Not on file  . Years of education: Not on file  . Highest education level: Not on  file  Occupational History  . Not on file  Social Needs  . Financial resource strain: Not on file  . Food insecurity:    Worry: Not on file    Inability: Not on file  . Transportation needs:    Medical: Not on file    Non-medical: Not on file  Tobacco Use  . Smoking status: Never Smoker  . Smokeless tobacco: Never Used  Substance and Sexual Activity  . Alcohol use: No    Alcohol/week: 0.0 standard drinks  . Drug use: No  . Sexual activity: Not Currently  Lifestyle  . Physical activity:    Days per week: Not on file    Minutes per session: Not on file  . Stress: Not on file  Relationships  . Social connections:    Talks on phone: Not on file    Gets together: Not on file    Attends religious service: Not on file    Active member of club or organization: Not on file    Attends meetings of clubs or organizations: Not on file    Relationship status: Not on file  . Intimate partner violence:    Fear of current or ex partner: Not on file    Emotionally abused: Not on file    Physically abused: Not on file    Forced sexual activity: Not on file  Other Topics Concern  . Not on file  Social History Narrative  . Not on file      Review of Systems  Constitutional: Negative for chills, fatigue and unexpected weight change.  HENT: Negative for congestion, rhinorrhea, sneezing and sore throat.   Eyes: Negative for photophobia, pain and redness.  Respiratory: Negative for cough, chest tightness and shortness of breath.   Cardiovascular: Negative for chest pain and palpitations.  Gastrointestinal: Negative for abdominal pain, constipation, diarrhea, nausea and vomiting.  Endocrine: Negative.   Genitourinary: Negative for dysuria and frequency.  Musculoskeletal: Negative for arthralgias, back pain, joint swelling and neck pain.  Skin: Negative for rash.  Allergic/Immunologic: Negative.   Neurological: Positive for headaches. Negative for tremors and numbness.  Hematological:  Negative for adenopathy. Does not bruise/bleed easily.  Psychiatric/Behavioral: Negative for behavioral problems and sleep disturbance. The patient is not nervous/anxious.     Vital Signs: BP 140/80   Pulse 78   Resp 16   Ht 5\' 5"  (1.651 m)   Wt 222 lb (100.7 kg)   SpO2 98%   BMI 36.94 kg/m    Physical Exam  Constitutional: She is oriented to person, place, and time. She appears well-developed and well-nourished. No distress.  HENT:  Head: Normocephalic and atraumatic.  Mouth/Throat: Oropharynx is clear and moist. No oropharyngeal exudate.  Eyes: Pupils are equal, round, and reactive to light. EOM are normal.  Neck: Normal range of motion. Neck supple. No JVD  present. No tracheal deviation present. No thyromegaly present.  Cardiovascular: Normal rate, regular rhythm and normal heart sounds. Exam reveals no gallop and no friction rub.  No murmur heard. Pulmonary/Chest: Effort normal and breath sounds normal. No respiratory distress. She has no wheezes. She has no rales. She exhibits no tenderness.  Abdominal: Soft. There is no tenderness. There is no guarding.  Musculoskeletal: Normal range of motion.  Lymphadenopathy:    She has no cervical adenopathy.  Neurological: She is alert and oriented to person, place, and time. No cranial nerve deficit.  Skin: Skin is warm and dry. She is not diaphoretic.  Psychiatric: She has a normal mood and affect. Her behavior is normal. Judgment and thought content normal.  Nursing note and vitals reviewed.   Assessment/Plan: 1. Gastroesophageal reflux disease, esophagitis presence not specified Continue Protonix.   2. Hypertension, unspecified type Start Lisinopril 5mg  po daily. Pt was previously on lisinopril, and reported good results, without side effects.  Continue to log blood pressure.    3. Multiple sclerosis (HCC) Follow up with neurology as scheduled.  Encouraged pt to have headaches evaluated at that time.   4. Nonintractable  headache, unspecified chronicity pattern, unspecified headache type Could be due to missing MS infusion. Will monitor, pt is following up with Neurology for CT scan and treatment as previously scheduled.   General Counseling: Marisue verbalizes understanding of the findings of todays visit and agrees with plan of treatment. I have discussed any further diagnostic evaluation that may be needed or ordered today. We also reviewed her medications today. she has been encouraged to call the office with any questions or concerns that should arise related to todays visit.  Meds ordered this encounter  Medications  . lisinopril (PRINIVIL,ZESTRIL) 5 MG tablet    Sig: Take 1 tablet (5 mg total) by mouth daily.    Dispense:  90 tablet    Refill:  3    Time spent: 25 Minutes   This patient was seen by Blima Ledger AGNP-C in Collaboration with Dr Lyndon Code as a part of collaborative care agreement    Dr Lyndon Code Internal medicine

## 2018-08-25 NOTE — Patient Instructions (Signed)

## 2018-08-31 ENCOUNTER — Inpatient Hospital Stay: Payer: Medicare Other

## 2018-08-31 ENCOUNTER — Inpatient Hospital Stay: Payer: Medicare Other | Attending: Hematology and Oncology | Admitting: Hematology and Oncology

## 2018-08-31 VITALS — BP 130/76 | HR 65 | Temp 96.9°F | Resp 18

## 2018-08-31 VITALS — BP 136/93 | HR 68 | Temp 95.0°F | Resp 18 | Wt 223.3 lb

## 2018-08-31 DIAGNOSIS — M21372 Foot drop, left foot: Secondary | ICD-10-CM | POA: Diagnosis not present

## 2018-08-31 DIAGNOSIS — K5909 Other constipation: Secondary | ICD-10-CM | POA: Insufficient documentation

## 2018-08-31 DIAGNOSIS — K219 Gastro-esophageal reflux disease without esophagitis: Secondary | ICD-10-CM | POA: Diagnosis not present

## 2018-08-31 DIAGNOSIS — G35 Multiple sclerosis: Secondary | ICD-10-CM

## 2018-08-31 DIAGNOSIS — I10 Essential (primary) hypertension: Secondary | ICD-10-CM | POA: Insufficient documentation

## 2018-08-31 DIAGNOSIS — F329 Major depressive disorder, single episode, unspecified: Secondary | ICD-10-CM | POA: Diagnosis not present

## 2018-08-31 DIAGNOSIS — Z79899 Other long term (current) drug therapy: Secondary | ICD-10-CM

## 2018-08-31 MED ORDER — ACETAMINOPHEN 500 MG PO TABS
1000.0000 mg | ORAL_TABLET | Freq: Once | ORAL | Status: AC
  Administered 2018-08-31: 1000 mg via ORAL

## 2018-08-31 MED ORDER — SODIUM CHLORIDE 0.9 % IV SOLN
Freq: Once | INTRAVENOUS | Status: AC
  Administered 2018-08-31: 14:00:00 via INTRAVENOUS
  Filled 2018-08-31: qty 250

## 2018-08-31 MED ORDER — SODIUM CHLORIDE 0.9 % IV SOLN
300.0000 mg | Freq: Once | INTRAVENOUS | Status: AC
  Administered 2018-08-31: 300 mg via INTRAVENOUS
  Filled 2018-08-31 (×2): qty 15

## 2018-08-31 MED ORDER — ACETAMINOPHEN 500 MG PO TABS
ORAL_TABLET | ORAL | Status: AC
  Filled 2018-08-31: qty 2

## 2018-08-31 NOTE — Progress Notes (Signed)
Patient offers no complaints today.  She does have some red, raised areas on the back of her neck.  She recently started Lisinopril 5 mg daily.  She also cut up a Jalapeno pepper yesterday.  Unsure as to what caused it.  Patient states the areas were sore and now are itching.
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
## 2018-09-02 ENCOUNTER — Telehealth: Payer: Self-pay

## 2018-09-02 ENCOUNTER — Other Ambulatory Visit: Payer: Self-pay | Admitting: Adult Health

## 2018-09-02 MED ORDER — LOSARTAN POTASSIUM 50 MG PO TABS: 50.0000 mg | ORAL_TABLET | Freq: Every day | ORAL | 2 refills | Status: DC

## 2018-09-02 NOTE — Telephone Encounter (Signed)
Tell her to stop Lisinopril, I sent RX for Losartan to pharmacy.

## 2018-09-02 NOTE — Telephone Encounter (Signed)
Pt advised stopped lisinopril and change to losartan

## 2018-09-03 ENCOUNTER — Encounter: Payer: Self-pay | Admitting: Hematology and Oncology

## 2018-09-09 DIAGNOSIS — F331 Major depressive disorder, recurrent, moderate: Secondary | ICD-10-CM | POA: Diagnosis not present

## 2018-09-16 DIAGNOSIS — G43709 Chronic migraine without aura, not intractable, without status migrainosus: Secondary | ICD-10-CM | POA: Diagnosis not present

## 2018-09-16 DIAGNOSIS — Z79899 Other long term (current) drug therapy: Secondary | ICD-10-CM | POA: Diagnosis not present

## 2018-09-16 DIAGNOSIS — G35 Multiple sclerosis: Secondary | ICD-10-CM | POA: Diagnosis not present

## 2018-09-19 DIAGNOSIS — M79651 Pain in right thigh: Secondary | ICD-10-CM | POA: Diagnosis not present

## 2018-09-19 DIAGNOSIS — R200 Anesthesia of skin: Secondary | ICD-10-CM | POA: Diagnosis not present

## 2018-09-19 DIAGNOSIS — R51 Headache: Secondary | ICD-10-CM | POA: Diagnosis not present

## 2018-09-19 DIAGNOSIS — Z451 Encounter for adjustment and management of infusion pump: Secondary | ICD-10-CM | POA: Diagnosis not present

## 2018-09-19 DIAGNOSIS — M24551 Contracture, right hip: Secondary | ICD-10-CM | POA: Diagnosis not present

## 2018-09-19 DIAGNOSIS — Z79899 Other long term (current) drug therapy: Secondary | ICD-10-CM | POA: Diagnosis not present

## 2018-09-19 DIAGNOSIS — R252 Cramp and spasm: Secondary | ICD-10-CM | POA: Diagnosis not present

## 2018-09-19 DIAGNOSIS — R600 Localized edema: Secondary | ICD-10-CM | POA: Diagnosis not present

## 2018-09-19 DIAGNOSIS — G35 Multiple sclerosis: Secondary | ICD-10-CM | POA: Diagnosis not present

## 2018-09-27 ENCOUNTER — Encounter: Payer: Self-pay | Admitting: Pharmacy Technician

## 2018-09-27 NOTE — Progress Notes (Signed)
Patient has been approved for drug assistance by [employer] for Tysabri. First DOS covered is 09/26/18.

## 2018-09-28 ENCOUNTER — Inpatient Hospital Stay: Payer: Medicare Other | Attending: Hematology and Oncology

## 2018-09-28 VITALS — BP 127/85 | HR 59 | Temp 97.5°F | Resp 18

## 2018-09-28 DIAGNOSIS — Z79899 Other long term (current) drug therapy: Secondary | ICD-10-CM | POA: Insufficient documentation

## 2018-09-28 DIAGNOSIS — G35 Multiple sclerosis: Secondary | ICD-10-CM

## 2018-09-28 MED ORDER — SODIUM CHLORIDE 0.9 % IV SOLN
300.0000 mg | Freq: Once | INTRAVENOUS | Status: AC
  Administered 2018-09-28: 300 mg via INTRAVENOUS
  Filled 2018-09-28: qty 15

## 2018-09-28 MED ORDER — SODIUM CHLORIDE 0.9 % IV SOLN
Freq: Once | INTRAVENOUS | Status: AC
  Administered 2018-09-28: 14:00:00 via INTRAVENOUS
  Filled 2018-09-28: qty 250

## 2018-09-28 MED ORDER — ACETAMINOPHEN 500 MG PO TABS
1000.0000 mg | ORAL_TABLET | Freq: Once | ORAL | Status: AC
  Administered 2018-09-28: 1000 mg via ORAL
  Filled 2018-09-28: qty 2

## 2018-09-29 ENCOUNTER — Ambulatory Visit: Payer: Self-pay | Admitting: Internal Medicine

## 2018-10-03 ENCOUNTER — Ambulatory Visit (INDEPENDENT_AMBULATORY_CARE_PROVIDER_SITE_OTHER): Payer: Medicare Other | Admitting: Adult Health

## 2018-10-03 ENCOUNTER — Encounter: Payer: Self-pay | Admitting: Adult Health

## 2018-10-03 VITALS — BP 128/78 | HR 70 | Resp 16 | Ht 65.0 in | Wt 219.0 lb

## 2018-10-03 DIAGNOSIS — I10 Essential (primary) hypertension: Secondary | ICD-10-CM | POA: Diagnosis not present

## 2018-10-03 DIAGNOSIS — G35 Multiple sclerosis: Secondary | ICD-10-CM

## 2018-10-03 DIAGNOSIS — R51 Headache: Secondary | ICD-10-CM

## 2018-10-03 DIAGNOSIS — R519 Headache, unspecified: Secondary | ICD-10-CM

## 2018-10-03 NOTE — Progress Notes (Signed)
[facility] [address] [place], [place] [address]  Internal MEDICINE  Office Visit Note  Patient Name: Rose Clements  [id number]  [id number]  Date of Service: 10/03/2018  Chief Complaint  Patient presents with  . Hypertension    HPI  Patient here for follow-up on hypertension and headaches.  She denies having any issues with her blood pressure since last visit.  Today her blood pressure is well controlled at 128/78.  She reports that her headaches have improved since her neurologist started her on Topamax.  She denies any further needs at this time.   Current Medication: Outpatient Encounter Medications as of 10/03/2018  Medication Sig Note  . baclofen (LIORESAL) 20 MG tablet Take 20 mg by mouth 2 (two) times daily.  02/09/2018: Now takes 10 mg twice daily  . cetirizine (ZYRTEC) 10 MG tablet Take 10 mg by mouth daily.   . Cholecalciferol (VITAMIN D3) 1000 UNITS CAPS Take 2 capsules by mouth daily.    . DULoxetine (CYMBALTA) 30 MG capsule Take by mouth. Take 3 caps po daily   . fluticasone (FLONASE) 50 MCG/ACT nasal spray Place 2 sprays into both nostrils daily.   Marland Kitchen losartan (COZAAR) 50 MG tablet Take 1 tablet (50 mg total) by mouth daily.   . natalizumab (TYSABRI) 300 MG/15ML injection Inject into the vein. 10/15/2015: Received from: [facility]  . pantoprazole (PROTONIX) 40 MG tablet Take 1 tablet (40 mg total) by mouth daily.   . Polyethylene Glycol POWD Take 17 g by mouth daily.   Marland Kitchen topiramate (TOPAMAX) 25 MG tablet Take by mouth.    No facility-administered encounter medications on file as of 10/03/2018.     Surgical History: Past Surgical History:  Procedure Laterality Date  . ABDOMINAL HYSTERECTOMY  2003  . baclofen pump insertion Right 01/04/2018  . CARPAL TUNNEL RELEASE Right 1997  . CESAREAN SECTION  1989  . CYSTECTOMY  2002   From chest  . ESOPHAGOGASTRODUODENOSCOPY (EGD) WITH PROPOFOL N/A 05/19/2016   Procedure: ESOPHAGOGASTRODUODENOSCOPY (EGD)  WITH PROPOFOL;  Surgeon: [name], MD;  Location: [facility] ENDOSCOPY;  Service: Endoscopy;  Laterality: N/A;  . KIDNEY DONATION Left 2002  . OVARIAN CYST REMOVAL Left 2002   Ovarian mass removal  . TONSILLECTOMY  1973  . UPPER GASTROINTESTINAL ENDOSCOPY  11/19/2014   Benign appearing esophageal stricture-Dilated.    Medical History: Past Medical History:  Diagnosis Date  . Constipation   . Cystitis bacillary, chronic   . Depression   . Dysphagia    Dx by Beverely Risen on 10/15/14  . Eosinophilic esophagitis 11/19/2014   GE junction benign stricture s/p balloon dilation, retained food contents. ?gastroparesis versus secondary to EE.  Marland Kitchen Esophageal dysphagia   . GERD (gastroesophageal reflux disease)   . Heartburn   . Hypertension   . Multilevel degenerative disc disease   . Multiple sclerosis (HCC)   . Multiple sclerosis (HCC) 07/16/2015    Family History: Family History  Problem Relation Age of Onset  . Diabetes Mother   . Hypertension Mother   . Heart disease Mother   . Pulmonary embolism Mother   . Diabetes Father   . Diabetes Sister   . Hypertension Sister   . Heart disease Sister   . Bipolar disorder Sister   . Narcolepsy Brother   . Heart attack Maternal Grandmother   . Pancreatic cancer Maternal Grandfather     Social History   Socioeconomic History  . Marital status: Married    Spouse name: Not  on file  . Number of children: Not on file  . Years of education: Not on file  . Highest education level: Not on file  Occupational History  . Not on file  Social Needs  . Financial resource strain: Not on file  . Food insecurity:    Worry: Not on file    Inability: Not on file  . Transportation needs:    Medical: Not on file    Non-medical: Not on file  Tobacco Use  . Smoking status: Never Smoker  . Smokeless tobacco: Never Used  Substance and Sexual Activity  . Alcohol use: No    Alcohol/week: 0.0 standard drinks  . Drug use: No  . Sexual activity: Not  Currently  Lifestyle  . Physical activity:    Days per week: Not on file    Minutes per session: Not on file  . Stress: Not on file  Relationships  . Social connections:    Talks on phone: Not on file    Gets together: Not on file    Attends religious service: Not on file    Active member of club or organization: Not on file    Attends meetings of clubs or organizations: Not on file    Relationship status: Not on file  . Intimate partner violence:    Fear of current or ex partner: Not on file    Emotionally abused: Not on file    Physically abused: Not on file    Forced sexual activity: Not on file  Other Topics Concern  . Not on file  Social History Narrative  . Not on file      Review of Systems  Constitutional: Negative for chills, fatigue and unexpected weight change.  HENT: Negative for congestion, rhinorrhea, sneezing and sore throat.   Eyes: Negative for photophobia, pain and redness.  Respiratory: Negative for cough, chest tightness and shortness of breath.   Cardiovascular: Negative for chest pain and palpitations.  Gastrointestinal: Negative for abdominal pain, constipation, diarrhea, nausea and vomiting.  Endocrine: Negative.   Genitourinary: Negative for dysuria and frequency.  Musculoskeletal: Negative for arthralgias, back pain, joint swelling and neck pain.  Skin: Negative for rash.  Allergic/Immunologic: Negative.   Neurological: Negative for tremors and numbness.  Hematological: Negative for adenopathy. Does not bruise/bleed easily.  Psychiatric/Behavioral: Negative for behavioral problems and sleep disturbance. The patient is not nervous/anxious.     Vital Signs: BP 128/78   Pulse 70   Resp 16   Ht 5\' 5"  (1.651 m)   Wt 219 lb (99.3 kg)   SpO2 97%   BMI 36.44 kg/m    Physical Exam  Constitutional: She is oriented to person, place, and time. She appears well-developed and well-nourished. No distress.  HENT:  Head: Normocephalic and atraumatic.   Mouth/Throat: Oropharynx is clear and moist. No oropharyngeal exudate.  Eyes: Pupils are equal, round, and reactive to light. EOM are normal.  Neck: Normal range of motion. Neck supple. No JVD present. No tracheal deviation present. No thyromegaly present.  Cardiovascular: Normal rate, regular rhythm and normal heart sounds. Exam reveals no gallop and no friction rub.  No murmur heard. Pulmonary/Chest: Effort normal and breath sounds normal. No respiratory distress. She has no wheezes. She has no rales. She exhibits no tenderness.  Abdominal: Soft. There is no tenderness. There is no guarding.  Musculoskeletal: Normal range of motion.  Lymphadenopathy:    She has no cervical adenopathy.  Neurological: She is alert and oriented to person, place,  and time. No cranial nerve deficit.  Skin: Skin is warm and dry. She is not diaphoretic.  Psychiatric: She has a normal mood and affect. Her behavior is normal. Judgment and thought content normal.  Nursing note and vitals reviewed.   Assessment/Plan: 1. Hypertension, unspecified type Blood pressure appears well-controlled on current dose of losartan.  We will follow-up with patient in 6 months, continue current medication therapy.  2. Nonintractable headache, unspecified chronicity pattern, unspecified headache type Headaches are much improved since the addition of Topamax to daily medication regimen.  Continue medications as directed.  3. Multiple sclerosis (HCC) Continue to be followed by neurology, keep all appointments.  General Counseling: Azyiah verbalizes understanding of the findings of todays visit and agrees with plan of treatment. I have discussed any further diagnostic evaluation that may be needed or ordered today. We also reviewed her medications today. she has been encouraged to call the office with any questions or concerns that should arise related to todays visit.    No orders of the defined types were placed in this  encounter.   No orders of the defined types were placed in this encounter.   Time spent: 20 Minutes   This patient was seen by Blima Ledger AGNP-C in Collaboration with Dr Lyndon Code as a part of collaborative care agreement     Johnna Acosta AGNP-C Internal medicine

## 2018-10-03 NOTE — Patient Instructions (Signed)

## 2018-10-07 DIAGNOSIS — F331 Major depressive disorder, recurrent, moderate: Secondary | ICD-10-CM | POA: Diagnosis not present

## 2018-10-15 DIAGNOSIS — Z23 Encounter for immunization: Secondary | ICD-10-CM | POA: Diagnosis not present

## 2018-10-26 ENCOUNTER — Inpatient Hospital Stay: Payer: Medicare Other | Attending: Hematology and Oncology

## 2018-10-26 VITALS — BP 107/72 | HR 75 | Resp 18

## 2018-10-26 DIAGNOSIS — Z79899 Other long term (current) drug therapy: Secondary | ICD-10-CM | POA: Diagnosis not present

## 2018-10-26 DIAGNOSIS — G35 Multiple sclerosis: Secondary | ICD-10-CM | POA: Diagnosis not present

## 2018-10-26 MED ORDER — ACETAMINOPHEN 500 MG PO TABS
1000.0000 mg | ORAL_TABLET | Freq: Once | ORAL | Status: AC
  Administered 2018-10-26: 1000 mg via ORAL
  Filled 2018-10-26: qty 2

## 2018-10-26 MED ORDER — SODIUM CHLORIDE 0.9 % IV SOLN
300.0000 mg | Freq: Once | INTRAVENOUS | Status: AC
  Administered 2018-10-26: 300 mg via INTRAVENOUS
  Filled 2018-10-26: qty 15

## 2018-10-26 MED ORDER — SODIUM CHLORIDE 0.9 % IV SOLN
Freq: Once | INTRAVENOUS | Status: AC
  Administered 2018-10-26: 14:00:00 via INTRAVENOUS
  Filled 2018-10-26: qty 250

## 2018-10-26 NOTE — Patient Instructions (Signed)

## 2018-11-09 DIAGNOSIS — F331 Major depressive disorder, recurrent, moderate: Secondary | ICD-10-CM | POA: Diagnosis not present

## 2018-11-15 ENCOUNTER — Other Ambulatory Visit: Payer: Self-pay | Admitting: Adult Health

## 2018-11-23 ENCOUNTER — Inpatient Hospital Stay: Payer: Medicare Other | Attending: Hematology and Oncology

## 2018-11-23 VITALS — BP 134/84 | HR 90 | Resp 18

## 2018-11-23 DIAGNOSIS — G35 Multiple sclerosis: Secondary | ICD-10-CM | POA: Diagnosis not present

## 2018-11-23 DIAGNOSIS — Z79899 Other long term (current) drug therapy: Secondary | ICD-10-CM | POA: Diagnosis not present

## 2018-11-23 MED ORDER — ACETAMINOPHEN 500 MG PO TABS
1000.0000 mg | ORAL_TABLET | Freq: Once | ORAL | Status: AC
  Administered 2018-11-23: 1000 mg via ORAL
  Filled 2018-11-23: qty 2

## 2018-11-23 MED ORDER — SODIUM CHLORIDE 0.9 % IV SOLN
300.0000 mg | Freq: Once | INTRAVENOUS | Status: AC
  Administered 2018-11-23: 300 mg via INTRAVENOUS
  Filled 2018-11-23: qty 15

## 2018-11-23 MED ORDER — SODIUM CHLORIDE 0.9 % IV SOLN
Freq: Once | INTRAVENOUS | Status: AC
  Administered 2018-11-23: 14:00:00 via INTRAVENOUS
  Filled 2018-11-23: qty 250

## 2018-11-23 NOTE — Patient Instructions (Signed)
Natalizumab injection What is this medicine? NATALIZUMAB (na ta LIZ you mab) is used to treat relapsing multiple sclerosis. This drug is not a cure. It is also used to treat Crohn's disease. This medicine may be used for other purposes; ask your health care provider or pharmacist if you have questions. COMMON BRAND NAME(S): Tysabri What should I tell my health care provider before I take this medicine? They need to know if you have any of these conditions: -immune system problems -progressive multifocal leukoencephalopathy (PML) -an unusual or allergic reaction to natalizumab, other medicines, foods, dyes, or preservatives -pregnant or trying to get pregnant -breast-feeding How should I use this medicine? This medicine is for infusion into a vein. It is given by a health care professional in a hospital or clinic setting. A special MedGuide will be given to you by the pharmacist with each prescription and refill. Be sure to read this information carefully each time. Talk to your pediatrician regarding the use of this medicine in children. This medicine is not approved for use in children. Overdosage: If you think you have taken too much of this medicine contact a poison control center or emergency room at once. NOTE: This medicine is only for you. Do not share this medicine with others. What if I miss a dose? It is important not to miss your dose. Call your doctor or health care professional if you are unable to keep an appointment. What may interact with this medicine? -azathioprine -cyclosporine -interferon -6-mercaptopurine -methotrexate -steroid medicines like prednisone or cortisone -TNF-alpha inhibitors like adalimumab, etanercept, and infliximab -vaccines This list may not describe all possible interactions. Give your health care provider a list of all the medicines, herbs, non-prescription drugs, or dietary supplements you use. Also tell them if you smoke, drink alcohol, or use  illegal drugs. Some items may interact with your medicine. What should I watch for while using this medicine? Your condition will be monitored carefully while you are receiving this medicine. Visit your doctor for regular check ups. Tell your doctor or healthcare professional if your symptoms do not start to get better or if they get worse. Stay away from people who are sick. Call your doctor or health care professional for advice if you get a fever, chills or sore throat, or other symptoms of a cold or flu. Do not treat yourself. In some patients, this medicine may cause a serious brain infection that may cause death. If you have any problems seeing, thinking, speaking, walking, or standing, tell your doctor right away. If you cannot reach your doctor, get urgent medical care. What side effects may I notice from receiving this medicine? Side effects that you should report to your doctor or health care professional as soon as possible: -allergic reactions like skin rash, itching or hives, swelling of the face, lips, or tongue -breathing problems -changes in vision -chest pain -dark urine -depression, feelings of sadness -dizziness -general ill feeling or flu-like symptoms -irregular, missed, or painful menstrual periods -light-colored stools -loss of appetite, nausea -muscle weakness -problems with balance, talking, or walking -right upper belly pain -unusually weak or tired -yellowing of the eyes or skin Side effects that usually do not require medical attention (report to your doctor or health care professional if they continue or are bothersome): -aches, pains -headache -stomach upset -tiredness This list may not describe all possible side effects. Call your doctor for medical advice about side effects. You may report side effects to FDA at 1-800-FDA-1088. Where should I keep   my medicine? This drug is given in a hospital or clinic and will not be stored at home. NOTE: This sheet is  a summary. It may not cover all possible information. If you have questions about this medicine, talk to your doctor, pharmacist, or health care provider.  2019 Elsevier/Gold Standard (2009-01-12 13:33:21)  

## 2018-12-14 DIAGNOSIS — F331 Major depressive disorder, recurrent, moderate: Secondary | ICD-10-CM | POA: Diagnosis not present

## 2018-12-21 ENCOUNTER — Inpatient Hospital Stay: Payer: Medicare Other | Attending: Hematology and Oncology

## 2018-12-21 VITALS — BP 142/93 | HR 88 | Temp 98.4°F | Resp 18

## 2018-12-21 DIAGNOSIS — Z79899 Other long term (current) drug therapy: Secondary | ICD-10-CM | POA: Diagnosis not present

## 2018-12-21 DIAGNOSIS — G35 Multiple sclerosis: Secondary | ICD-10-CM | POA: Diagnosis not present

## 2018-12-21 MED ORDER — ACETAMINOPHEN 500 MG PO TABS
1000.0000 mg | ORAL_TABLET | Freq: Once | ORAL | Status: AC
  Administered 2018-12-21: 1000 mg via ORAL
  Filled 2018-12-21: qty 2

## 2018-12-21 MED ORDER — SODIUM CHLORIDE 0.9 % IV SOLN
Freq: Once | INTRAVENOUS | Status: AC
  Administered 2018-12-21: 14:00:00 via INTRAVENOUS
  Filled 2018-12-21: qty 250

## 2018-12-21 MED ORDER — SODIUM CHLORIDE 0.9 % IV SOLN
300.0000 mg | Freq: Once | INTRAVENOUS | Status: AC
  Administered 2018-12-21: 300 mg via INTRAVENOUS
  Filled 2018-12-21: qty 15

## 2019-01-09 DIAGNOSIS — Z79899 Other long term (current) drug therapy: Secondary | ICD-10-CM | POA: Diagnosis not present

## 2019-01-09 DIAGNOSIS — R252 Cramp and spasm: Secondary | ICD-10-CM | POA: Diagnosis not present

## 2019-01-18 ENCOUNTER — Inpatient Hospital Stay: Payer: Medicare Other | Attending: Hematology and Oncology

## 2019-01-18 ENCOUNTER — Encounter: Payer: Self-pay | Admitting: Pharmacy Technician

## 2019-01-18 VITALS — BP 125/83 | HR 73 | Temp 96.0°F | Resp 20

## 2019-01-18 DIAGNOSIS — Z79899 Other long term (current) drug therapy: Secondary | ICD-10-CM | POA: Diagnosis not present

## 2019-01-18 DIAGNOSIS — G35 Multiple sclerosis: Secondary | ICD-10-CM | POA: Insufficient documentation

## 2019-01-18 MED ORDER — ACETAMINOPHEN 500 MG PO TABS
1000.0000 mg | ORAL_TABLET | Freq: Once | ORAL | Status: AC
  Administered 2019-01-18: 1000 mg via ORAL
  Filled 2019-01-18: qty 2

## 2019-01-18 MED ORDER — SODIUM CHLORIDE 0.9 % IV SOLN
Freq: Once | INTRAVENOUS | Status: AC
  Administered 2019-01-18: 14:00:00 via INTRAVENOUS
  Filled 2019-01-18: qty 250

## 2019-01-18 MED ORDER — SODIUM CHLORIDE 0.9 % IV SOLN
300.0000 mg | Freq: Once | INTRAVENOUS | Status: AC
  Administered 2019-01-18: 300 mg via INTRAVENOUS
  Filled 2019-01-18: qty 15

## 2019-01-18 NOTE — Progress Notes (Signed)
Patient no longer getting Tysabri from [employer]. based on [employer] co pay assistance starting 12/09/18-12/09/19 for $5600. Last DOS covered is 11/23/18.

## 2019-01-18 NOTE — Patient Instructions (Signed)
Natalizumab injection What is this medicine? NATALIZUMAB (na ta LIZ you mab) is used to treat relapsing multiple sclerosis. This drug is not a cure. It is also used to treat Crohn's disease. This medicine may be used for other purposes; ask your health care provider or pharmacist if you have questions. COMMON BRAND NAME(S): Tysabri What should I tell my health care provider before I take this medicine? They need to know if you have any of these conditions: -immune system problems -progressive multifocal leukoencephalopathy (PML) -an unusual or allergic reaction to natalizumab, other medicines, foods, dyes, or preservatives -pregnant or trying to get pregnant -breast-feeding How should I use this medicine? This medicine is for infusion into a vein. It is given by a health care professional in a hospital or clinic setting. A special MedGuide will be given to you by the pharmacist with each prescription and refill. Be sure to read this information carefully each time. Talk to your pediatrician regarding the use of this medicine in children. This medicine is not approved for use in children. Overdosage: If you think you have taken too much of this medicine contact a poison control center or emergency room at once. NOTE: This medicine is only for you. Do not share this medicine with others. What if I miss a dose? It is important not to miss your dose. Call your doctor or health care professional if you are unable to keep an appointment. What may interact with this medicine? -azathioprine -cyclosporine -interferon -6-mercaptopurine -methotrexate -steroid medicines like prednisone or cortisone -TNF-alpha inhibitors like adalimumab, etanercept, and infliximab -vaccines This list may not describe all possible interactions. Give your health care provider a list of all the medicines, herbs, non-prescription drugs, or dietary supplements you use. Also tell them if you smoke, drink alcohol, or use  illegal drugs. Some items may interact with your medicine. What should I watch for while using this medicine? Your condition will be monitored carefully while you are receiving this medicine. Visit your doctor for regular check ups. Tell your doctor or healthcare professional if your symptoms do not start to get better or if they get worse. Stay away from people who are sick. Call your doctor or health care professional for advice if you get a fever, chills or sore throat, or other symptoms of a cold or flu. Do not treat yourself. In some patients, this medicine may cause a serious brain infection that may cause death. If you have any problems seeing, thinking, speaking, walking, or standing, tell your doctor right away. If you cannot reach your doctor, get urgent medical care. What side effects may I notice from receiving this medicine? Side effects that you should report to your doctor or health care professional as soon as possible: -allergic reactions like skin rash, itching or hives, swelling of the face, lips, or tongue -breathing problems -changes in vision -chest pain -dark urine -depression, feelings of sadness -dizziness -general ill feeling or flu-like symptoms -irregular, missed, or painful menstrual periods -light-colored stools -loss of appetite, nausea -muscle weakness -problems with balance, talking, or walking -right upper belly pain -unusually weak or tired -yellowing of the eyes or skin Side effects that usually do not require medical attention (report to your doctor or health care professional if they continue or are bothersome): -aches, pains -headache -stomach upset -tiredness This list may not describe all possible side effects. Call your doctor for medical advice about side effects. You may report side effects to FDA at 1-800-FDA-1088. Where should I keep   my medicine? This drug is given in a hospital or clinic and will not be stored at home. NOTE: This sheet is  a summary. It may not cover all possible information. If you have questions about this medicine, talk to your doctor, pharmacist, or health care provider.  2019 Elsevier/Gold Standard (2009-01-12 13:33:21)  

## 2019-02-01 NOTE — Progress Notes (Signed)
AX#[id number]

## 2019-02-15 ENCOUNTER — Inpatient Hospital Stay: Payer: Medicare Other | Attending: Hematology and Oncology | Admitting: Urgent Care

## 2019-02-15 ENCOUNTER — Other Ambulatory Visit: Payer: Self-pay

## 2019-02-15 ENCOUNTER — Inpatient Hospital Stay: Payer: Medicare Other

## 2019-02-15 VITALS — BP 139/90 | HR 64 | Temp 98.3°F | Resp 16 | Wt 214.0 lb

## 2019-02-15 VITALS — BP 107/65 | HR 86 | Resp 16

## 2019-02-15 DIAGNOSIS — M21372 Foot drop, left foot: Secondary | ICD-10-CM

## 2019-02-15 DIAGNOSIS — Z79899 Other long term (current) drug therapy: Secondary | ICD-10-CM | POA: Insufficient documentation

## 2019-02-15 DIAGNOSIS — G35 Multiple sclerosis: Secondary | ICD-10-CM

## 2019-02-15 DIAGNOSIS — Z5181 Encounter for therapeutic drug level monitoring: Secondary | ICD-10-CM

## 2019-02-15 DIAGNOSIS — Z298 Encounter for other specified prophylactic measures: Secondary | ICD-10-CM

## 2019-02-15 DIAGNOSIS — R51 Headache: Secondary | ICD-10-CM | POA: Diagnosis not present

## 2019-02-15 LAB — COMPREHENSIVE METABOLIC PANEL
ALT: 9 U/L (ref 0–44)
AST: 15 U/L (ref 15–41)
Albumin: 4.1 g/dL (ref 3.5–5.0)
Alkaline Phosphatase: 73 U/L (ref 38–126)
Anion gap: 8 (ref 5–15)
BUN: 15 mg/dL (ref 6–20)
CO2: 25 mmol/L (ref 22–32)
Calcium: 9.2 mg/dL (ref 8.9–10.3)
Chloride: 105 mmol/L (ref 98–111)
Creatinine, Ser: 0.93 mg/dL (ref 0.44–1.00)
GFR calc Af Amer: >60 mL/min (ref >60)
GFR calc non Af Amer: >60 mL/min (ref >60)
Glucose, Bld: 101 mg/dL — ABNORMAL HIGH (ref 70–99)
Potassium: 4.1 mmol/L (ref 3.5–5.1)
Sodium: 138 mmol/L (ref 135–145)
Total Bilirubin: 0.6 mg/dL (ref 0.3–1.2)
Total Protein: 7 g/dL (ref 6.5–8.1)

## 2019-02-15 LAB — CBC WITH DIFFERENTIAL/PLATELET
Abs Immature Granulocytes: 0.01 10*3/uL (ref 0.00–0.07)
BASOS ABS: 0.1 10*3/uL (ref 0.0–0.1)
Basophils Relative: 1 %
Eosinophils Absolute: 0.2 10*3/uL (ref 0.0–0.5)
Eosinophils Relative: 3 %
HCT: 37.5 % (ref 36.0–46.0)
Hemoglobin: 11.9 g/dL — ABNORMAL LOW (ref 12.0–15.0)
Immature Granulocytes: 0 %
LYMPHS PCT: 69 %
Lymphs Abs: 4.9 10*3/uL — ABNORMAL HIGH (ref 0.7–4.0)
MCH: 30 pg (ref 26.0–34.0)
MCHC: 31.7 g/dL (ref 30.0–36.0)
MCV: 94.5 fL (ref 80.0–100.0)
Monocytes Absolute: 0.5 10*3/uL (ref 0.1–1.0)
Monocytes Relative: 7 %
NRBC: 0.3 % — AB (ref 0.0–0.2)
Neutro Abs: 1.4 10*3/uL — ABNORMAL LOW (ref 1.7–7.7)
Neutrophils Relative %: 20 %
Platelets: 285 10*3/uL (ref 150–400)
RBC: 3.97 MIL/uL (ref 3.87–5.11)
RDW: 12.2 % (ref 11.5–15.5)
WBC: 7 10*3/uL (ref 4.0–10.5)

## 2019-02-15 MED ORDER — SODIUM CHLORIDE 0.9 % IV SOLN
300.0000 mg | Freq: Once | INTRAVENOUS | Status: AC
  Administered 2019-02-15: 300 mg via INTRAVENOUS
  Filled 2019-02-15: qty 15

## 2019-02-15 MED ORDER — ACETAMINOPHEN 500 MG PO TABS
1000.0000 mg | ORAL_TABLET | Freq: Once | ORAL | Status: AC
  Administered 2019-02-15: 1000 mg via ORAL
  Filled 2019-02-15: qty 2

## 2019-02-15 MED ORDER — SODIUM CHLORIDE 0.9 % IV SOLN
Freq: Once | INTRAVENOUS | Status: AC
  Administered 2019-02-15: 15:00:00 via INTRAVENOUS
  Filled 2019-02-15: qty 250

## 2019-02-15 NOTE — Progress Notes (Signed)
Pt here for follow up. Denies any concerns at this time.  

## 2019-02-15 NOTE — Patient Instructions (Signed)
Natalizumab injection What is this medicine? NATALIZUMAB (na ta LIZ you mab) is used to treat relapsing multiple sclerosis. This drug is not a cure. It is also used to treat Crohn's disease. This medicine may be used for other purposes; ask your health care provider or pharmacist if you have questions. COMMON BRAND NAME(S): Tysabri What should I tell my health care provider before I take this medicine? They need to know if you have any of these conditions: -immune system problems -progressive multifocal leukoencephalopathy (PML) -an unusual or allergic reaction to natalizumab, other medicines, foods, dyes, or preservatives -pregnant or trying to get pregnant -breast-feeding How should I use this medicine? This medicine is for infusion into a vein. It is given by a health care professional in a hospital or clinic setting. A special MedGuide will be given to you by the pharmacist with each prescription and refill. Be sure to read this information carefully each time. Talk to your pediatrician regarding the use of this medicine in children. This medicine is not approved for use in children. Overdosage: If you think you have taken too much of this medicine contact a poison control center or emergency room at once. NOTE: This medicine is only for you. Do not share this medicine with others. What if I miss a dose? It is important not to miss your dose. Call your doctor or health care professional if you are unable to keep an appointment. What may interact with this medicine? -azathioprine -cyclosporine -interferon -6-mercaptopurine -methotrexate -steroid medicines like prednisone or cortisone -TNF-alpha inhibitors like adalimumab, etanercept, and infliximab -vaccines This list may not describe all possible interactions. Give your health care provider a list of all the medicines, herbs, non-prescription drugs, or dietary supplements you use. Also tell them if you smoke, drink alcohol, or use  illegal drugs. Some items may interact with your medicine. What should I watch for while using this medicine? Your condition will be monitored carefully while you are receiving this medicine. Visit your doctor for regular check ups. Tell your doctor or healthcare professional if your symptoms do not start to get better or if they get worse. Stay away from people who are sick. Call your doctor or health care professional for advice if you get a fever, chills or sore throat, or other symptoms of a cold or flu. Do not treat yourself. In some patients, this medicine may cause a serious brain infection that may cause death. If you have any problems seeing, thinking, speaking, walking, or standing, tell your doctor right away. If you cannot reach your doctor, get urgent medical care. What side effects may I notice from receiving this medicine? Side effects that you should report to your doctor or health care professional as soon as possible: -allergic reactions like skin rash, itching or hives, swelling of the face, lips, or tongue -breathing problems -changes in vision -chest pain -dark urine -depression, feelings of sadness -dizziness -general ill feeling or flu-like symptoms -irregular, missed, or painful menstrual periods -light-colored stools -loss of appetite, nausea -muscle weakness -problems with balance, talking, or walking -right upper belly pain -unusually weak or tired -yellowing of the eyes or skin Side effects that usually do not require medical attention (report to your doctor or health care professional if they continue or are bothersome): -aches, pains -headache -stomach upset -tiredness This list may not describe all possible side effects. Call your doctor for medical advice about side effects. You may report side effects to FDA at 1-800-FDA-1088. Where should I keep   my medicine? This drug is given in a hospital or clinic and will not be stored at home. NOTE: This sheet is  a summary. It may not cover all possible information. If you have questions about this medicine, talk to your doctor, pharmacist, or health care provider.  2019 Elsevier/Gold Standard (2009-01-12 13:33:21)  
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
## 2019-02-22 LAB — JC VIRUS DNA,PCR (WHOLE BLOOD): JC Virus DNA, PCR, Blood: NEGATIVE

## 2019-03-15 ENCOUNTER — Inpatient Hospital Stay: Payer: Medicare Other | Attending: Hematology and Oncology

## 2019-03-15 ENCOUNTER — Other Ambulatory Visit: Payer: Self-pay

## 2019-03-15 VITALS — BP 123/78 | HR 81 | Temp 98.7°F | Resp 18

## 2019-03-15 DIAGNOSIS — G35 Multiple sclerosis: Secondary | ICD-10-CM | POA: Diagnosis not present

## 2019-03-15 DIAGNOSIS — Z79899 Other long term (current) drug therapy: Secondary | ICD-10-CM | POA: Insufficient documentation

## 2019-03-15 MED ORDER — SODIUM CHLORIDE 0.9 % IV SOLN
Freq: Once | INTRAVENOUS | Status: AC
  Administered 2019-03-15: 14:00:00 via INTRAVENOUS
  Filled 2019-03-15: qty 250

## 2019-03-15 MED ORDER — SODIUM CHLORIDE 0.9 % IV SOLN
300.0000 mg | Freq: Once | INTRAVENOUS | Status: AC
  Administered 2019-03-15: 14:00:00 300 mg via INTRAVENOUS
  Filled 2019-03-15: qty 15

## 2019-03-15 MED ORDER — ACETAMINOPHEN 500 MG PO TABS
1000.0000 mg | ORAL_TABLET | Freq: Once | ORAL | Status: AC
  Administered 2019-03-15: 1000 mg via ORAL

## 2019-03-15 NOTE — Patient Instructions (Signed)
Natalizumab injection What is this medicine? NATALIZUMAB (na ta LIZ you mab) is used to treat relapsing multiple sclerosis. This drug is not a cure. It is also used to treat Crohn's disease. This medicine may be used for other purposes; ask your health care provider or pharmacist if you have questions. COMMON BRAND NAME(S): Tysabri What should I tell my health care provider before I take this medicine? They need to know if you have any of these conditions: -immune system problems -progressive multifocal leukoencephalopathy (PML) -an unusual or allergic reaction to natalizumab, other medicines, foods, dyes, or preservatives -pregnant or trying to get pregnant -breast-feeding How should I use this medicine? This medicine is for infusion into a vein. It is given by a health care professional in a hospital or clinic setting. A special MedGuide will be given to you by the pharmacist with each prescription and refill. Be sure to read this information carefully each time. Talk to your pediatrician regarding the use of this medicine in children. This medicine is not approved for use in children. Overdosage: If you think you have taken too much of this medicine contact a poison control center or emergency room at once. NOTE: This medicine is only for you. Do not share this medicine with others. What if I miss a dose? It is important not to miss your dose. Call your doctor or health care professional if you are unable to keep an appointment. What may interact with this medicine? -azathioprine -cyclosporine -interferon -6-mercaptopurine -methotrexate -steroid medicines like prednisone or cortisone -TNF-alpha inhibitors like adalimumab, etanercept, and infliximab -vaccines This list may not describe all possible interactions. Give your health care provider a list of all the medicines, herbs, non-prescription drugs, or dietary supplements you use. Also tell them if you smoke, drink alcohol, or use  illegal drugs. Some items may interact with your medicine. What should I watch for while using this medicine? Your condition will be monitored carefully while you are receiving this medicine. Visit your doctor for regular check ups. Tell your doctor or healthcare professional if your symptoms do not start to get better or if they get worse. Stay away from people who are sick. Call your doctor or health care professional for advice if you get a fever, chills or sore throat, or other symptoms of a cold or flu. Do not treat yourself. In some patients, this medicine may cause a serious brain infection that may cause death. If you have any problems seeing, thinking, speaking, walking, or standing, tell your doctor right away. If you cannot reach your doctor, get urgent medical care. What side effects may I notice from receiving this medicine? Side effects that you should report to your doctor or health care professional as soon as possible: -allergic reactions like skin rash, itching or hives, swelling of the face, lips, or tongue -breathing problems -changes in vision -chest pain -dark urine -depression, feelings of sadness -dizziness -general ill feeling or flu-like symptoms -irregular, missed, or painful menstrual periods -light-colored stools -loss of appetite, nausea -muscle weakness -problems with balance, talking, or walking -right upper belly pain -unusually weak or tired -yellowing of the eyes or skin Side effects that usually do not require medical attention (report to your doctor or health care professional if they continue or are bothersome): -aches, pains -headache -stomach upset -tiredness This list may not describe all possible side effects. Call your doctor for medical advice about side effects. You may report side effects to FDA at 1-800-FDA-1088. Where should I keep   my medicine? This drug is given in a hospital or clinic and will not be stored at home. NOTE: This sheet is  a summary. It may not cover all possible information. If you have questions about this medicine, talk to your doctor, pharmacist, or health care provider.  2019 Elsevier/Gold Standard (2009-01-12 13:33:21)  

## 2019-03-20 DIAGNOSIS — G35 Multiple sclerosis: Secondary | ICD-10-CM | POA: Diagnosis not present

## 2019-03-20 DIAGNOSIS — Z79899 Other long term (current) drug therapy: Secondary | ICD-10-CM | POA: Diagnosis not present

## 2019-04-04 ENCOUNTER — Ambulatory Visit (INDEPENDENT_AMBULATORY_CARE_PROVIDER_SITE_OTHER): Payer: Medicare Other | Admitting: Adult Health

## 2019-04-04 ENCOUNTER — Encounter: Payer: Self-pay | Admitting: Adult Health

## 2019-04-04 ENCOUNTER — Other Ambulatory Visit: Payer: Self-pay

## 2019-04-04 VITALS — BP 111/71 | HR 63 | Resp 16 | Ht 65.0 in | Wt 211.8 lb

## 2019-04-04 DIAGNOSIS — I10 Essential (primary) hypertension: Secondary | ICD-10-CM

## 2019-04-04 DIAGNOSIS — K219 Gastro-esophageal reflux disease without esophagitis: Secondary | ICD-10-CM

## 2019-04-04 DIAGNOSIS — G35 Multiple sclerosis: Secondary | ICD-10-CM

## 2019-04-04 MED ORDER — PANTOPRAZOLE SODIUM 40 MG PO TBEC: 40.0000 mg | DELAYED_RELEASE_TABLET | Freq: Every day | ORAL | 2 refills | Status: DC

## 2019-04-04 NOTE — Progress Notes (Signed)
[facility] [address] [place], [place] [address]  Internal MEDICINE  Telephone Visit  Patient Name: Rose Clements  [id number]  [id number]  Date of Service: 04/04/2019  I connected with the patient at 955 by telephone and verified the patients identity using two identifiers.  I discussed the limitations, risks, security and privacy concerns of performing an evaluation and management service by telephone and the availability of in person appointments. I also discussed with the patient that there may be a patient responsible charge related to the service.  The patient expressed understanding and agrees to proceed.    Chief Complaint  Patient presents with  . Telephone Screen  . Gastroesophageal Reflux    MED REFILL   . Hypertension  . Telephone Assessment    HPI  Pt at televisit for HTN and Med refill on GERD medication.  She reports her blood pressure has been well controlled currently. She denies Chest pain, Shortness of breath, palpitations, headache, or blurred vision. Her gerd is generally well controlled, and she needs a refill on pantoprazole.     Current Medication: Outpatient Encounter Medications as of 04/04/2019  Medication Sig Note  . baclofen (LIORESAL) 20 MG tablet Take 10 mg by mouth as needed.    . cetirizine (ZYRTEC) 10 MG tablet Take 10 mg by mouth daily.   . Cholecalciferol (VITAMIN D3) 1000 UNITS CAPS Take 2 capsules by mouth daily.    . DULoxetine (CYMBALTA) 30 MG capsule Take 30 mg by mouth. TAKE 3 CAPSULES ONCE DAILY   . fluticasone (FLONASE) 50 MCG/ACT nasal spray Place 2 sprays into both nostrils daily.   . natalizumab (TYSABRI) 300 MG/15ML injection Inject into the vein. 10/15/2015: Received from: [facility]  . pantoprazole (PROTONIX) 40 MG tablet Take 1 tablet (40 mg total) by mouth daily.   . Polyethylene Glycol POWD Take 17 g by mouth daily. (Patient taking differently: Take 17 g by mouth as needed. )   . topiramate (TOPAMAX) 50 MG  tablet Take 50 mg by mouth 2 (two) times daily.   . [DISCONTINUED] pantoprazole (PROTONIX) 40 MG tablet Take 1 tablet (40 mg total) by mouth daily.   . [DISCONTINUED] topiramate (TOPAMAX) 25 MG tablet Take 25 mg by mouth 2 (two) times daily.     No facility-administered encounter medications on file as of 04/04/2019.     Surgical History: Past Surgical History:  Procedure Laterality Date  . ABDOMINAL HYSTERECTOMY  2003  . baclofen pump insertion Right 01/04/2018  . CARPAL TUNNEL RELEASE Right 1997  . CESAREAN SECTION  1989  . CYSTECTOMY  2002   From chest  . ESOPHAGOGASTRODUODENOSCOPY (EGD) WITH PROPOFOL N/A 05/19/2016   Procedure: ESOPHAGOGASTRODUODENOSCOPY (EGD) WITH PROPOFOL;  Surgeon: [name], MD;  Location: [facility] ENDOSCOPY;  Service: Endoscopy;  Laterality: N/A;  . KIDNEY DONATION Left 2002  . OVARIAN CYST REMOVAL Left 2002   Ovarian mass removal  . TONSILLECTOMY  1973  . UPPER GASTROINTESTINAL ENDOSCOPY  11/19/2014   Benign appearing esophageal stricture-Dilated.    Medical History: Past Medical History:  Diagnosis Date  . Constipation   . Cystitis bacillary, chronic   . Depression   . Dysphagia    Dx by Beverely Risen on 10/15/14  . Eosinophilic esophagitis 11/19/2014   GE junction benign stricture s/p balloon dilation, retained food contents. ?gastroparesis versus secondary to EE.  Marland Kitchen Esophageal dysphagia   . GERD (gastroesophageal reflux disease)   . Heartburn   . Hypertension   . Multilevel degenerative disc  disease   . Multiple sclerosis (HCC)   . Multiple sclerosis (HCC) 07/16/2015    Family History: Family History  Problem Relation Age of Onset  . Diabetes Mother   . Hypertension Mother   . Heart disease Mother   . Pulmonary embolism Mother   . Diabetes Father   . Diabetes Sister   . Hypertension Sister   . Heart disease Sister   . Bipolar disorder Sister   . Narcolepsy Brother   . Heart attack Maternal Grandmother   . Pancreatic cancer Maternal  Grandfather     Social History   Socioeconomic History  . Marital status: Married    Spouse name: Not on file  . Number of children: Not on file  . Years of education: Not on file  . Highest education level: Not on file  Occupational History  . Not on file  Social Needs  . Financial resource strain: Not on file  . Food insecurity:    Worry: Not on file    Inability: Not on file  . Transportation needs:    Medical: Not on file    Non-medical: Not on file  Tobacco Use  . Smoking status: Never Smoker  . Smokeless tobacco: Never Used  Substance and Sexual Activity  . Alcohol use: No    Alcohol/week: 0.0 standard drinks  . Drug use: No  . Sexual activity: Not Currently  Lifestyle  . Physical activity:    Days per week: Not on file    Minutes per session: Not on file  . Stress: Not on file  Relationships  . Social connections:    Talks on phone: Not on file    Gets together: Not on file    Attends religious service: Not on file    Active member of club or organization: Not on file    Attends meetings of clubs or organizations: Not on file    Relationship status: Not on file  . Intimate partner violence:    Fear of current or ex partner: Not on file    Emotionally abused: Not on file    Physically abused: Not on file    Forced sexual activity: Not on file  Other Topics Concern  . Not on file  Social History Narrative  . Not on file      Review of Systems  Constitutional: Negative for chills, fatigue and unexpected weight change.  HENT: Negative for congestion, rhinorrhea, sneezing and sore throat.   Eyes: Negative for photophobia, pain and redness.  Respiratory: Negative for cough, chest tightness and shortness of breath.   Cardiovascular: Negative for chest pain and palpitations.  Gastrointestinal: Negative for abdominal pain, constipation, diarrhea, nausea and vomiting.  Endocrine: Negative.   Genitourinary: Negative for dysuria and frequency.   Musculoskeletal: Negative for arthralgias, back pain, joint swelling and neck pain.  Skin: Negative for rash.  Allergic/Immunologic: Negative.   Neurological: Negative for tremors and numbness.  Hematological: Negative for adenopathy. Does not bruise/bleed easily.  Psychiatric/Behavioral: Negative for behavioral problems and sleep disturbance. The patient is not nervous/anxious.     Vital Signs: BP 111/71   Pulse 63   Resp 16   Ht 5\' 5"  (1.651 m)   Wt 211 lb 12.8 oz (96.1 kg)   BMI 35.25 kg/m    Observation/Objective:  Well appearing, speaking in full sentences with NAD noted.   Assessment/Plan:  1. Hypertension, unspecified type Stable, continue present management.   2. Gastroesophageal reflux disease, esophagitis presence not specified Patients pantoprazole  controlling symptoms well.  Refilled at this time.   3. Multiple sclerosis (HCC) Continue follow up with neurology as scheduled.    General Counseling: Louis verbalizes understanding of the findings of today's phone visit and agrees with plan of treatment. I have discussed any further diagnostic evaluation that may be needed or ordered today. We also reviewed her medications today. she has been encouraged to call the office with any questions or concerns that should arise related to todays visit.    No orders of the defined types were placed in this encounter.   Meds ordered this encounter  Medications  . pantoprazole (PROTONIX) 40 MG tablet    Sig: Take 1 tablet (40 mg total) by mouth daily.    Dispense:  90 tablet    Refill:  2    Time spent: 11 Minutes    Blima LedgerAdam Jeanise Durfey AGNP-C Internal medicine

## 2019-04-04 NOTE — Patient Instructions (Signed)

## 2019-04-11 ENCOUNTER — Other Ambulatory Visit: Payer: Self-pay

## 2019-04-12 ENCOUNTER — Inpatient Hospital Stay: Payer: Medicare Other | Attending: Hematology and Oncology

## 2019-04-12 VITALS — BP 122/79 | HR 92 | Temp 97.1°F | Resp 18

## 2019-04-12 DIAGNOSIS — Z79899 Other long term (current) drug therapy: Secondary | ICD-10-CM | POA: Diagnosis not present

## 2019-04-12 DIAGNOSIS — G35 Multiple sclerosis: Secondary | ICD-10-CM

## 2019-04-12 MED ORDER — SODIUM CHLORIDE 0.9 % IV SOLN
Freq: Once | INTRAVENOUS | Status: AC
  Administered 2019-04-12: 14:00:00 via INTRAVENOUS
  Filled 2019-04-12: qty 250

## 2019-04-12 MED ORDER — ACETAMINOPHEN 500 MG PO TABS
1000.0000 mg | ORAL_TABLET | Freq: Once | ORAL | Status: AC
  Administered 2019-04-12: 1000 mg via ORAL
  Filled 2019-04-12: qty 2

## 2019-04-12 MED ORDER — SODIUM CHLORIDE 0.9 % IV SOLN
300.0000 mg | Freq: Once | INTRAVENOUS | Status: AC
  Administered 2019-04-12: 300 mg via INTRAVENOUS
  Filled 2019-04-12: qty 15

## 2019-04-17 DIAGNOSIS — Z79899 Other long term (current) drug therapy: Secondary | ICD-10-CM | POA: Diagnosis not present

## 2019-04-17 DIAGNOSIS — R252 Cramp and spasm: Secondary | ICD-10-CM | POA: Diagnosis not present

## 2019-04-17 DIAGNOSIS — M79651 Pain in right thigh: Secondary | ICD-10-CM | POA: Diagnosis not present

## 2019-04-17 DIAGNOSIS — M24551 Contracture, right hip: Secondary | ICD-10-CM | POA: Diagnosis not present

## 2019-04-17 DIAGNOSIS — Z451 Encounter for adjustment and management of infusion pump: Secondary | ICD-10-CM | POA: Diagnosis not present

## 2019-04-17 DIAGNOSIS — R200 Anesthesia of skin: Secondary | ICD-10-CM | POA: Diagnosis not present

## 2019-04-17 DIAGNOSIS — G43909 Migraine, unspecified, not intractable, without status migrainosus: Secondary | ICD-10-CM | POA: Diagnosis not present

## 2019-05-09 ENCOUNTER — Other Ambulatory Visit: Payer: Self-pay

## 2019-05-10 ENCOUNTER — Inpatient Hospital Stay: Payer: Medicare Other | Attending: Hematology and Oncology

## 2019-05-10 DIAGNOSIS — Z5112 Encounter for antineoplastic immunotherapy: Secondary | ICD-10-CM | POA: Diagnosis not present

## 2019-05-10 DIAGNOSIS — G35 Multiple sclerosis: Secondary | ICD-10-CM | POA: Insufficient documentation

## 2019-05-10 MED ORDER — ACETAMINOPHEN 500 MG PO TABS
1000.0000 mg | ORAL_TABLET | Freq: Once | ORAL | Status: AC
  Administered 2019-05-10: 1000 mg via ORAL
  Filled 2019-05-10: qty 2

## 2019-05-10 MED ORDER — SODIUM CHLORIDE 0.9 % IV SOLN
300.0000 mg | Freq: Once | INTRAVENOUS | Status: AC
  Administered 2019-05-10: 300 mg via INTRAVENOUS
  Filled 2019-05-10: qty 15

## 2019-05-10 MED ORDER — SODIUM CHLORIDE 0.9 % IV SOLN
Freq: Once | INTRAVENOUS | Status: AC
  Administered 2019-05-10: 14:00:00 via INTRAVENOUS
  Filled 2019-05-10: qty 250

## 2019-05-10 MED ORDER — DIPHENHYDRAMINE HCL 50 MG/ML IJ SOLN: 50.0000 mg | Freq: Once | INTRAMUSCULAR | Status: DC

## 2019-05-31 ENCOUNTER — Encounter: Payer: Medicare Other | Admitting: Adult Health

## 2019-06-06 ENCOUNTER — Other Ambulatory Visit: Payer: Self-pay

## 2019-06-06 MED ORDER — FLUTICASONE PROPIONATE 50 MCG/ACT NA SUSP: 2.0000 | Freq: Every day | NASAL | 2 refills | Status: DC

## 2019-06-07 ENCOUNTER — Other Ambulatory Visit: Payer: Self-pay

## 2019-06-07 ENCOUNTER — Inpatient Hospital Stay: Payer: Medicare Other | Attending: Hematology and Oncology

## 2019-06-07 VITALS — BP 113/78 | HR 76 | Temp 98.6°F | Resp 18

## 2019-06-07 DIAGNOSIS — Z79899 Other long term (current) drug therapy: Secondary | ICD-10-CM | POA: Diagnosis not present

## 2019-06-07 DIAGNOSIS — G35 Multiple sclerosis: Secondary | ICD-10-CM | POA: Insufficient documentation

## 2019-06-07 DIAGNOSIS — Z5112 Encounter for antineoplastic immunotherapy: Secondary | ICD-10-CM | POA: Diagnosis not present

## 2019-06-07 MED ORDER — SODIUM CHLORIDE 0.9 % IV SOLN
Freq: Once | INTRAVENOUS | Status: AC
  Administered 2019-06-07: 14:00:00 via INTRAVENOUS
  Filled 2019-06-07: qty 250

## 2019-06-07 MED ORDER — SODIUM CHLORIDE 0.9 % IV SOLN
300.0000 mg | Freq: Once | INTRAVENOUS | Status: AC
  Administered 2019-06-07: 300 mg via INTRAVENOUS
  Filled 2019-06-07: qty 15

## 2019-06-07 MED ORDER — ACETAMINOPHEN 500 MG PO TABS
1000.0000 mg | ORAL_TABLET | Freq: Once | ORAL | Status: AC
  Administered 2019-06-07: 1000 mg via ORAL
  Filled 2019-06-07: qty 2

## 2019-06-07 NOTE — Patient Instructions (Signed)
Natalizumab injection What is this medicine? NATALIZUMAB (na ta LIZ you mab) is used to treat relapsing multiple sclerosis. This drug is not a cure. It is also used to treat Crohn's disease. This medicine may be used for other purposes; ask your health care provider or pharmacist if you have questions. COMMON BRAND NAME(S): Tysabri What should I tell my health care provider before I take this medicine? They need to know if you have any of these conditions:  immune system problems  progressive multifocal leukoencephalopathy (PML)  an unusual or allergic reaction to natalizumab, other medicines, foods, dyes, or preservatives  pregnant or trying to get pregnant  breast-feeding How should I use this medicine? This medicine is for infusion into a vein. It is given by a health care professional in a hospital or clinic setting. A special MedGuide will be given to you by the pharmacist with each prescription and refill. Be sure to read this information carefully each time. Talk to your pediatrician regarding the use of this medicine in children. This medicine is not approved for use in children. Overdosage: If you think you have taken too much of this medicine contact a poison control center or emergency room at once. NOTE: This medicine is only for you. Do not share this medicine with others. What if I miss a dose? It is important not to miss your dose. Call your doctor or health care professional if you are unable to keep an appointment. What may interact with this medicine?  azathioprine  cyclosporine  interferon  6-mercaptopurine  methotrexate  steroid medicines like prednisone or cortisone  TNF-alpha inhibitors like adalimumab, etanercept, and infliximab  vaccines This list may not describe all possible interactions. Give your health care provider a list of all the medicines, herbs, non-prescription drugs, or dietary supplements you use. Also tell them if you smoke, drink  alcohol, or use illegal drugs. Some items may interact with your medicine. What should I watch for while using this medicine? Your condition will be monitored carefully while you are receiving this medicine. Visit your doctor for regular check ups. Tell your doctor or healthcare professional if your symptoms do not start to get better or if they get worse. Stay away from people who are sick. Call your doctor or health care professional for advice if you get a fever, chills or sore throat, or other symptoms of a cold or flu. Do not treat yourself. In some patients, this medicine may cause a serious brain infection that may cause death. If you have any problems seeing, thinking, speaking, walking, or standing, tell your doctor right away. If you cannot reach your doctor, get urgent medical care. What side effects may I notice from receiving this medicine? Side effects that you should report to your doctor or health care professional as soon as possible:  allergic reactions like skin rash, itching or hives, swelling of the face, lips, or tongue  breathing problems  changes in vision  chest pain  dark urine  depression, feelings of sadness  dizziness  general ill feeling or flu-like symptoms  irregular, missed, or painful menstrual periods  light-colored stools  loss of appetite, nausea  muscle weakness  problems with balance, talking, or walking  right upper belly pain  unusually weak or tired  yellowing of the eyes or skin Side effects that usually do not require medical attention (report to your doctor or health care professional if they continue or are bothersome):  aches, pains  headache  stomach upset    tiredness This list may not describe all possible side effects. Call your doctor for medical advice about side effects. You may report side effects to FDA at 1-800-FDA-1088. Where should I keep my medicine? This drug is given in a hospital or clinic and will not be  stored at home. NOTE: This sheet is a summary. It may not cover all possible information. If you have questions about this medicine, talk to your doctor, pharmacist, or health care provider.  2020 Elsevier/Gold Standard (2009-01-12 13:33:21)  

## 2019-07-05 ENCOUNTER — Inpatient Hospital Stay: Payer: Medicare Other

## 2019-07-05 ENCOUNTER — Other Ambulatory Visit: Payer: Self-pay

## 2019-07-05 VITALS — BP 125/82 | HR 65 | Temp 98.1°F | Resp 17

## 2019-07-05 DIAGNOSIS — Z5112 Encounter for antineoplastic immunotherapy: Secondary | ICD-10-CM | POA: Diagnosis not present

## 2019-07-05 DIAGNOSIS — G35 Multiple sclerosis: Secondary | ICD-10-CM | POA: Diagnosis not present

## 2019-07-05 DIAGNOSIS — Z79899 Other long term (current) drug therapy: Secondary | ICD-10-CM | POA: Diagnosis not present

## 2019-07-05 MED ORDER — ACETAMINOPHEN 500 MG PO TABS
1000.0000 mg | ORAL_TABLET | Freq: Once | ORAL | Status: AC
  Administered 2019-07-05: 1000 mg via ORAL
  Filled 2019-07-05: qty 2

## 2019-07-05 MED ORDER — SODIUM CHLORIDE 0.9 % IV SOLN
300.0000 mg | Freq: Once | INTRAVENOUS | Status: AC
  Administered 2019-07-05: 300 mg via INTRAVENOUS
  Filled 2019-07-05: qty 15

## 2019-07-05 MED ORDER — SODIUM CHLORIDE 0.9 % IV SOLN
Freq: Once | INTRAVENOUS | Status: AC
  Administered 2019-07-05: 14:00:00 via INTRAVENOUS
  Filled 2019-07-05: qty 250

## 2019-07-05 MED ORDER — DIPHENHYDRAMINE HCL 50 MG/ML IJ SOLN: 50.0000 mg | Freq: Once | INTRAMUSCULAR | Status: DC

## 2019-07-17 DIAGNOSIS — R51 Headache: Secondary | ICD-10-CM | POA: Diagnosis not present

## 2019-07-17 DIAGNOSIS — G35 Multiple sclerosis: Secondary | ICD-10-CM | POA: Diagnosis not present

## 2019-07-17 DIAGNOSIS — M62830 Muscle spasm of back: Secondary | ICD-10-CM | POA: Diagnosis not present

## 2019-07-17 DIAGNOSIS — Z451 Encounter for adjustment and management of infusion pump: Secondary | ICD-10-CM | POA: Diagnosis not present

## 2019-07-17 DIAGNOSIS — Z79899 Other long term (current) drug therapy: Secondary | ICD-10-CM | POA: Diagnosis not present

## 2019-07-17 DIAGNOSIS — R200 Anesthesia of skin: Secondary | ICD-10-CM | POA: Diagnosis not present

## 2019-08-02 ENCOUNTER — Ambulatory Visit: Payer: Self-pay | Admitting: Hematology and Oncology

## 2019-08-02 ENCOUNTER — Ambulatory Visit: Payer: Self-pay

## 2019-08-08 ENCOUNTER — Ambulatory Visit: Payer: Medicare Other | Admitting: Neurology

## 2019-08-24 ENCOUNTER — Telehealth: Payer: Self-pay | Admitting: *Deleted

## 2019-08-24 ENCOUNTER — Encounter: Payer: Self-pay | Admitting: Neurology

## 2019-08-24 ENCOUNTER — Ambulatory Visit (INDEPENDENT_AMBULATORY_CARE_PROVIDER_SITE_OTHER): Payer: Medicare Other | Admitting: Neurology

## 2019-08-24 ENCOUNTER — Other Ambulatory Visit: Payer: Self-pay

## 2019-08-24 VITALS — BP 105/70 | HR 66 | Temp 97.3°F | Ht 65.0 in | Wt 204.0 lb

## 2019-08-24 DIAGNOSIS — Z79899 Other long term (current) drug therapy: Secondary | ICD-10-CM

## 2019-08-24 DIAGNOSIS — G35 Multiple sclerosis: Secondary | ICD-10-CM | POA: Diagnosis not present

## 2019-08-24 MED ORDER — MODAFINIL 200 MG PO TABS: 200.0000 mg | ORAL_TABLET | Freq: Every day | ORAL | 1 refills | Status: DC

## 2019-08-24 MED ORDER — SOLIFENACIN SUCCINATE 10 MG PO TABS: 10.0000 mg | ORAL_TABLET | Freq: Every day | ORAL | 4 refills | Status: DC

## 2019-08-24 NOTE — Telephone Encounter (Signed)
Placed JCV lab in [facility] lock box for routine lab pick up. Results pending. 

## 2019-08-24 NOTE — Telephone Encounter (Signed)
Called, LVM for pt to call office. 

## 2019-08-24 NOTE — Progress Notes (Addendum)
[facility]  PATIENT: Rose Clements DOB: February 28, 1961  REFERRING DOCTOR OR PCP:  Beverely RisenFozia Khan, MD SOURCE: Patient, notes from primary care and neurology, MRI and laboratory reports, MRI images personally reviewed.  _________________________________   HISTORICAL  CHIEF COMPLAINT:  Chief Complaint  Patient presents with  . New Patient (Initial Visit)    RM 12, alone. Paper referral from Dr. Nedra HaiLee for MS (RRMS).   . Multiple Sclerosis    On Tysabri. Wanting to get infusions here. Last JCV 02/15/19 negative, can be seen in epic. One prior done 09/16/2018 0.22 indeterminate, inhibition assay: negative. Last infusion: 07/05/2019. Going to back to Dr. Ronalee RedHartsell on 09/19/2019 (her primary neurologist).   . Peripheral Neuropathy    On topamax.   . Gait Problem    Ambulates with rolling walker. Last fall a couple weeks ago. Has fallen twice in the last 2 months. Left leg more weak.   . Baclofen pump    [facility]. Next refill 10/2019. Gets it refilled every 3 months. [facility].     HISTORY OF PRESENT ILLNESS:  I had the pleasure of seeing your patient, Rose Clements, at [facility] for neurologic consultation regarding her relapsing remitting multiple sclerosis.    She is a 58 year old woman who was diagnosed with relapsing remitting MS in 2011 after she presented with stiffness in her left arm and a clumsy gait with some falls.   In retrospect some of the leg symptoms started a little earlier.    She had MRI's and LP with lesions and CSF findings consistent with MS.    She was placed on Avonex.   She did ok initially but then had an exacerbation with more issues with her legs.   She was started on Tysabri about 2016 after these issues.   She has done well with Tysabri and has not had any exacerbations.   Initially, [place] was diagnosed and treated in [place].  [place] in [place] and has seen [facility]  [facility] since that time.  She has difficulty with gait due to spasticity and weakness in legs, left worse than right.   Even with a baclofen pump, she has left sided spasticity fairly constantly.   Her pump is managed by Lucy AntiguaBeth Parente, PA-C at [facility].   Her dose is 90.76 mcg daily.  Dose has been increased some.   Years ago she tried Ampyra for gait without success.  She reports they are concerned that a higher dose caused her to fall more.  Her right arm is strong and left has mild weakness and numbness and clumsiness.   She has urinary urgency with some incontinence.   She did not have much success with oxybutynin and Myrbetriq in the past.    Vision is ok.  She has fatigue, severe at times.  This is physical > mental.   She does not sleep well.   She snores but does not have OSA.    She reports PSG with Dr. Freda MunroSaadat Khan in [place] showed narcolepsy and cataplexy.  However, she does not described cataplexy symptoms when discussed today.   She has vivid dreams and occasional hypnagogic hallucinations and sleep paralysis.   She is very sleepy during the day.   Modafinil helped some.     She also has migraine headaches , better with topiramate.   She has some depression helped by duloxetine.       She  also has had esophageal strictures for dysphagia and has had 2 procedures.   Symptoms worsened after a high dose course of steroids.      I personally reviewed several MRIs: MRI of the cervical spine 03/07/2018 and 05/15/2018 show a couple T2 hyperintense lesions within the spinal cord.  One to the left adjacent to C2-C3 and 1 to the right adjacent to C4.    MRI of the brain 05/15/2018 and 03/07/2018 show multiple T2/flair hyperintense foci.  Somewhat nonspecific but a few are periventricular and juxtacortical.  There is no change between the 2 studies.    MRI of the thoracic spine 05/16/2015 did not show any definite lesions.   Hemangiomas within the T2 and T6 vertebral body.  ESS 08/24/2019 [facility]  SLEEPINESS SCALE  On a scale of 0 - 3 what is the chance of dozing:  Sitting and Reading:   3 Watching TV:    3 Sitting inactive in a public place: 0 Passenger in car for one hour: 3 Lying down to rest in the afternoon: 3 Sitting and talking to someone: 2 Sitting quietly after lunch:  3 In a car, stopped in traffic:  1  Total (out of 24):  18/24   Moderate sleepiness.    REVIEW OF SYSTEMS: Constitutional: No fevers, chills, sweats, or change in appetite Eyes: No visual changes, double vision, eye pain Ear, nose and throat: No hearing loss, ear pain, nasal congestion, sore throat Cardiovascular: No chest pain, palpitations Respiratory: No shortness of breath at rest or with exertion.   No wheezes GastrointestinaI: No nausea, vomiting, diarrhea, abdominal pain, fecal incontinence Genitourinary: No dysuria, urinary retention or frequency.  No nocturia. Musculoskeletal: No neck pain, back pain Integumentary: No rash, pruritus, skin lesions Neurological: as above Psychiatric: No depression at this time.  No anxiety Endocrine: No palpitations, diaphoresis, change in appetite, change in weigh or increased thirst Hematologic/Lymphatic: No anemia, purpura, petechiae. Allergic/Immunologic: No itchy/runny eyes, nasal congestion, recent allergic reactions, rashes  ALLERGIES: Allergies  Allergen Reactions  . Lisinopril Hives    HOME MEDICATIONS:  Current Outpatient Medications:  .  baclofen (LIORESAL) 20 MG tablet, Take 10 mg by mouth as needed. , Disp: , Rfl:  .  cetirizine (ZYRTEC) 10 MG tablet, Take 10 mg by mouth daily., Disp: , Rfl:  .  Cholecalciferol (VITAMIN D3) 1000 UNITS CAPS, Take 2 capsules by mouth daily. , Disp: , Rfl:  .  DULoxetine (CYMBALTA) 30 MG capsule, Take 30 mg by mouth. TAKE 3 CAPSULES ONCE DAILY, Disp: , Rfl:  .  fluticasone (FLONASE) 50 MCG/ACT nasal spray, Place 2 sprays into both nostrils daily., Disp: 16 g, Rfl: 2 .  natalizumab (TYSABRI) 300 MG/15ML  injection, Inject into the vein., Disp: , Rfl:  .  pantoprazole (PROTONIX) 40 MG tablet, Take 1 tablet (40 mg total) by mouth daily., Disp: 90 tablet, Rfl: 2 .  Polyethylene Glycol POWD, Take 17 g by mouth daily. (Patient taking differently: Take 17 g by mouth as needed. ), Disp: 527 g, Rfl: 12 .  topiramate (TOPAMAX) 50 MG tablet, Take 50 mg by mouth 2 (two) times daily., Disp: , Rfl:   PAST MEDICAL HISTORY: Past Medical History:  Diagnosis Date  . Constipation   . Cystitis bacillary, chronic   . Depression   . Dysphagia    Dx by Beverely Risen on 10/15/14  . Eosinophilic esophagitis 11/19/2014   GE junction benign stricture s/p balloon dilation, retained food contents. ?gastroparesis versus secondary to EE.  Marland Kitchen Esophageal  dysphagia   . GERD (gastroesophageal reflux disease)   . Heartburn   . Hypertension   . Multilevel degenerative disc disease   . Multiple sclerosis (HCC)   . Multiple sclerosis (HCC) 07/16/2015    PAST SURGICAL HISTORY: Past Surgical History:  Procedure Laterality Date  . ABDOMINAL HYSTERECTOMY  2003  . baclofen pump insertion Right 01/04/2018  . CARPAL TUNNEL RELEASE Right 1997  . CESAREAN SECTION  1989  . CYSTECTOMY  2002   From chest  . ESOPHAGOGASTRODUODENOSCOPY (EGD) WITH PROPOFOL N/A 05/19/2016   Procedure: ESOPHAGOGASTRODUODENOSCOPY (EGD) WITH PROPOFOL;  Surgeon: Midge Miniumarren Wohl, MD;  Location: [facility] ENDOSCOPY;  Service: Endoscopy;  Laterality: N/A;  . KIDNEY DONATION Left 2002  . OVARIAN CYST REMOVAL Left 2002   Ovarian mass removal  . TONSILLECTOMY  1973  . UPPER GASTROINTESTINAL ENDOSCOPY  11/19/2014   Benign appearing esophageal stricture-Dilated.    FAMILY HISTORY: Family History  Problem Relation Age of Onset  . Diabetes Mother   . Hypertension Mother   . Heart disease Mother   . Pulmonary embolism Mother   . Diabetes Father   . Diabetes Sister   . Hypertension Sister   . Heart disease Sister   . Bipolar disorder Sister   . Narcolepsy Brother    . Heart attack Maternal Grandmother   . Pancreatic cancer Maternal Grandfather     SOCIAL HISTORY:  Social History   Socioeconomic History  . Marital status: Married    Spouse name: Stephannie PetersMilton  . Number of children: 1  . Years of education: Not on file  . Highest education level: Not on file  Occupational History  . Occupation: Disability  Social Needs  . Financial resource strain: Not on file  . Food insecurity    Worry: Not on file    Inability: Not on file  . Transportation needs    Medical: Not on file    Non-medical: Not on file  Tobacco Use  . Smoking status: Never Smoker  . Smokeless tobacco: Never Used  Substance and Sexual Activity  . Alcohol use: No    Alcohol/week: 0.0 standard drinks  . Drug use: No  . Sexual activity: Not Currently  Lifestyle  . Physical activity    Days per week: Not on file    Minutes per session: Not on file  . Stress: Not on file  Relationships  . Social Musicianconnections    Talks on phone: Not on file    Gets together: Not on file    Attends religious service: Not on file    Active member of club or organization: Not on file    Attends meetings of clubs or organizations: Not on file    Relationship status: Not on file  . Intimate partner violence    Fear of current or ex partner: Not on file    Emotionally abused: Not on file    Physically abused: Not on file    Forced sexual activity: Not on file  Other Topics Concern  . Not on file  Social History Narrative   Lives with husband, Jarvis NewcomerMilton Bonnette   Right handed    Caffeine use: tea sometimes (mostly herbal)     PHYSICAL EXAM  Vitals:   08/24/19 0833  BP: 105/70  Pulse: 66  Temp: (!) 97.3 F (36.3 C)  SpO2: 96%  Weight: 204 lb (92.5 kg)  Height: 5\' 5"  (1.651 m)    Body mass index is 33.95 kg/m.   General: The patient is well-developed and  well-nourished and in no acute distress  HEENT:  Head is [place]/AT.  Sclera are anicteric.  Funduscopic exam shows normal optic discs  and retinal vessels.  Neck: No carotid bruits are noted.  The neck is nontender.  Cardiovascular: The heart has a regular rate and rhythm with a normal S1 and S2. There were no murmurs, gallops or rubs.    Skin: Extremities are without rash or  edema.  Musculoskeletal:  Back is nontender  Neurologic Exam  Mental status: The patient is alert and oriented x 3 at the time of the examination. The patient has apparent normal recent and remote memory, with an apparently normal attention span and concentration ability.   Speech is normal.  Cranial nerves: Extraocular movements are full. Pupils are equal, round, and reactive to light and accomodation.  Color vision is symmetric.  Visual fields are full.  Facial symmetry is present. There is good facial sensation to soft touch bilaterally.Facial strength is normal.  Trapezius and sternocleidomastoid strength is normal. No dysarthria is noted.  The tongue is midline, and the patient has symmetric elevation of the soft palate. No obvious hearing deficits are noted.  Motor:  Muscle bulk is normal.   Muscle tone is increased, left greater than right leg.  She had reduced rapid altering movements in the left arm and strength was 4/5 in the triceps and 5/5 elsewhere.  Strength was 4+ to 5/5 in the right leg and 4/5 in the left leg.  Sensory: Sensory testing is intact to pinprick, soft touch and vibration sensation in all 4 extremities.  Coordination: Cerebellar testing reveals good finger-nose-finger and heel-to-shin bilaterally.  Gait and station: Station is normal.   She has a spastic gait and a mild left foot drop.. Romberg is negative.   Reflexes: Deep tendon reflexes are increased in the legs, left greater than right and there are crossed abductor reflexes.  Slight asymmetry in the arms.   Plantar responses are flexor.    DIAGNOSTIC DATA (LABS, IMAGING, TESTING) - I reviewed patient records, labs, notes, testing and imaging myself where available.   Lab Results  Component Value Date   WBC 7.0 02/15/2019   HGB 11.9 (L) 02/15/2019   HCT 37.5 02/15/2019   MCV 94.5 02/15/2019   PLT 285 02/15/2019      Component Value Date/Time   NA 138 02/15/2019 1500   NA 139 06/22/2018 1345   K 4.1 02/15/2019 1500   CL 105 02/15/2019 1500   CO2 25 02/15/2019 1500   GLUCOSE 101 (H) 02/15/2019 1500   BUN 15 02/15/2019 1500   BUN 10 06/22/2018 1345   CREATININE 0.93 02/15/2019 1500   CALCIUM 9.2 02/15/2019 1500   PROT 7.0 02/15/2019 1500   PROT 6.8 06/22/2018 1345   ALBUMIN 4.1 02/15/2019 1500   ALBUMIN 4.3 06/22/2018 1345   AST 15 02/15/2019 1500   ALT 9 02/15/2019 1500   ALKPHOS 73 02/15/2019 1500   BILITOT 0.6 02/15/2019 1500   BILITOT 0.5 06/22/2018 1345   GFRNONAA >60 02/15/2019 1500   GFRAA >60 02/15/2019 1500   Lab Results  Component Value Date   CHOL 236 (H) 06/22/2018   HDL 69 06/22/2018   LDLCALC 146 (H) 06/22/2018   TRIG 103 06/22/2018   No results found for: HGBA1C No results found for: VITAMINB12 Lab Results  Component Value Date   TSH 1.590 06/22/2018       ASSESSMENT AND PLAN  Multiple sclerosis (HCC) - Plan: Stratify JCV Antibody Test ([facility]), CBC  with Differential/Platelet  High risk medication use - Plan: Stratify JCV Antibody Test ([facility]), CBC with Differential/Platelet   In summary, Ms. Conradt is a 57 year old woman who was diagnosed with relapsing remitting MS in 2011.  Her main impairments are related to her weakness and spasticity in her legs.   She has been stable on Tysabri the last few years and would like to continue that medication.  We will work to get her transitioned to this office so that we can get her started with her infusions as soon as possible.  We will recheck the JCV antibody and CBC today.  She has urinary urgency and frequency.  I will send in a prescription for Vesicare to see if that would help her symptoms.  Additionally she has daytime sleepiness.  I will send in a prescription  for modafinil.  She reports that she had a sleep study that showed narcolepsy and we will see we can get that report.  She will return to see me in 6 months or sooner if there are new or worsening neurologic symptoms.  Thank you for asking me to see Ms. Throckmorton.  Please let me know if I can be of further assistance with her or other patients in the future.     Doroteo Nickolson A. Felecia Shelling, MD, [facility] 2/77/8242, 3:53 AM Certified in Neurology, Clinical Neurophysiology, Sleep Medicine and Neuroimaging  [facility] [address] [place], [place] [address] [phone]

## 2019-08-25 LAB — CBC WITH DIFFERENTIAL/PLATELET
Basophils Absolute: 0.1 10*3/uL (ref 0.0–0.2)
Basos: 1 %
EOS (ABSOLUTE): 0.1 10*3/uL (ref 0.0–0.4)
Eos: 2 %
Hematocrit: 39.8 % (ref 34.0–46.6)
Hemoglobin: 12.4 g/dL (ref 11.1–15.9)
Immature Grans (Abs): 0 10*3/uL (ref 0.0–0.1)
Immature Granulocytes: 0 %
Lymphocytes Absolute: 3.3 10*3/uL — ABNORMAL HIGH (ref 0.7–3.1)
Lymphs: 58 %
MCH: 29.6 pg (ref 26.6–33.0)
MCHC: 31.2 g/dL — ABNORMAL LOW (ref 31.5–35.7)
MCV: 95 fL (ref 79–97)
Monocytes Absolute: 0.4 10*3/uL (ref 0.1–0.9)
Monocytes: 8 %
Neutrophils Absolute: 1.8 10*3/uL (ref 1.4–7.0)
Neutrophils: 31 %
Platelets: 309 10*3/uL (ref 150–450)
RBC: 4.19 x10E6/uL (ref 3.77–5.28)
RDW: 12.8 % (ref 11.7–15.4)
WBC: 5.7 10*3/uL (ref 3.4–10.8)

## 2019-09-04 ENCOUNTER — Other Ambulatory Visit: Payer: Self-pay

## 2019-09-04 MED ORDER — FLUTICASONE PROPIONATE 50 MCG/ACT NA SUSP: 2.0000 | Freq: Every day | NASAL | 2 refills | Status: DC

## 2019-09-04 NOTE — Telephone Encounter (Signed)
JCV drawn on 08/24/19 indeterminate, index: 0.24. Inhibition assay: negative

## 2019-09-04 NOTE — Telephone Encounter (Signed)
Called [facility] and requested results to be faxed to [phone]. Have not received results. Waiting on results.  

## 2019-09-26 DIAGNOSIS — G35 Multiple sclerosis: Secondary | ICD-10-CM | POA: Diagnosis not present

## 2019-10-20 DIAGNOSIS — Z451 Encounter for adjustment and management of infusion pump: Secondary | ICD-10-CM | POA: Diagnosis not present

## 2019-10-20 DIAGNOSIS — R252 Cramp and spasm: Secondary | ICD-10-CM | POA: Diagnosis not present

## 2019-10-20 DIAGNOSIS — G35 Multiple sclerosis: Secondary | ICD-10-CM | POA: Diagnosis not present

## 2019-10-24 DIAGNOSIS — G35 Multiple sclerosis: Secondary | ICD-10-CM | POA: Diagnosis not present

## 2019-11-17 ENCOUNTER — Telehealth: Payer: Self-pay

## 2019-11-17 NOTE — Telephone Encounter (Signed)
Rescheduled appointment on 11/22/2019 to 12/12/2019. klh

## 2019-11-20 ENCOUNTER — Telehealth: Payer: Self-pay | Admitting: Neurology

## 2019-11-20 MED ORDER — MODAFINIL 200 MG PO TABS: 200.0000 mg | ORAL_TABLET | Freq: Every day | ORAL | 1 refills | Status: DC

## 2019-11-20 MED ORDER — SOLIFENACIN SUCCINATE 10 MG PO TABS: 10.0000 mg | ORAL_TABLET | Freq: Every day | ORAL | 4 refills | Status: DC

## 2019-11-20 NOTE — Telephone Encounter (Signed)
Called pt back. She reports cool/heat sensation going throughout her legs/feet in the last few weeks. This is a new sx. Sometimes makes her feel weak. Denies any signs/sx of UTI or other infections. Has not started any new medication recently.  She has infusion scheduled here with intrafusion tomorrow at 730am. She will be here till about 930/1000am. She would like to discuss briefly with Md. Advised I will send him a message.  She also was never able to pick up vesicare or modafinil. Too expensive per pt via insurance. I went over option of goodrx coupon. She would like to try this. Advised we will send rx's to [facility], [place], [place], [place] [address].  I called [facility] [place] at [phone] and cx rx modafinil and vesicare on file.

## 2019-11-20 NOTE — Telephone Encounter (Signed)
Pt called stating she is needing to speak to provider and she is scheduled for an infusion tomorrow and is wanting to know if she can be squeezed in real quick to speak to provider. Please advise.

## 2019-11-21 ENCOUNTER — Ambulatory Visit (INDEPENDENT_AMBULATORY_CARE_PROVIDER_SITE_OTHER): Payer: Medicare Other | Admitting: Neurology

## 2019-11-21 ENCOUNTER — Encounter: Payer: Self-pay | Admitting: Neurology

## 2019-11-21 ENCOUNTER — Other Ambulatory Visit: Payer: Self-pay

## 2019-11-21 DIAGNOSIS — R29898 Other symptoms and signs involving the musculoskeletal system: Secondary | ICD-10-CM | POA: Diagnosis not present

## 2019-11-21 DIAGNOSIS — G35 Multiple sclerosis: Secondary | ICD-10-CM

## 2019-11-21 DIAGNOSIS — R262 Difficulty in walking, not elsewhere classified: Secondary | ICD-10-CM | POA: Diagnosis not present

## 2019-11-21 DIAGNOSIS — Z79899 Other long term (current) drug therapy: Secondary | ICD-10-CM

## 2019-11-21 MED ORDER — PREDNISONE 50 MG PO TABS: ORAL_TABLET | ORAL | 0 refills | Status: DC

## 2019-11-21 NOTE — Progress Notes (Signed)
[facility]  PATIENT: Rose Clements DOB: 1961/09/24  REFERRING DOCTOR OR PCP:  Beverely Risen, MD SOURCE: Patient, notes from primary care and neurology, MRI and laboratory reports, MRI images personally reviewed.  _________________________________   HISTORICAL  CHIEF COMPLAINT:  Chief Complaint  Patient presents with  . Multiple Sclerosis   Update 11/21/2019: Rose Clements is a 58 year old woman with MS.  She is on Tysabri as her disease modifying therapy and tolerates it well.  She recently transferred her care from [facility] to our site to get infusions closer to home.  Over the past 7 days she has noted more weakness in the right leg.  The left leg is still weaker than the right leg but before last week the right leg she has had minimal weakness.  She has some spasticity bilaterally.  She notes that her walking is less balance and she needs to hold on more.  She notes no change in bladder (on Vesicare for urgency).  She notes no new numbness.  She notes fatigue but at a similar level as previously.  Modafinil helps with fatigue some.  Mood is doing well.  Initial consultation 08/24/2019:  I had the pleasure of seeing your patient, Rose Clements, at the MS center at [facility] for neurologic consultation regarding her relapsing remitting multiple sclerosis.    She is a 58 year old woman who was diagnosed with relapsing remitting MS in 2011 after she presented with stiffness in her left arm and a clumsy gait with some falls.   In retrospect some of the leg symptoms started a little earlier.    She had MRI's and LP with lesions and CSF findings consistent with MS.    She was placed on Avonex.   She did ok initially but then had an exacerbation with more issues with her legs.   She was started on Tysabri about 2016 after these issues.   She has done well with Tysabri and has not had any exacerbations.   Initially, she was diagnosed and treated in [place].  She moved  to [facility]) in 2016 and has seen Dr. Ronalee Red at [facility] since that time.  She has difficulty with gait due to spasticity and weakness in legs, left worse than right.   Even with a baclofen pump, she has left sided spasticity fairly constantly.   Her pump is managed by Lucy Antigua, PA-C at [facility].   Her dose is 90.76 mcg daily.  Dose has been increased some.   Years ago she tried Ampyra for gait without success.  She reports they are concerned that a higher dose caused her to fall more.  Her right arm is strong and left has mild weakness and numbness and clumsiness.   She has urinary urgency with some incontinence.   She did not have much success with oxybutynin and Myrbetriq in the past.    Vision is ok.  She has fatigue, severe at times.  This is physical > mental.   She does not sleep well.   She snores but does not have OSA.    She reports PSG with Dr. Freda Munro in [place] showed narcolepsy and cataplexy.  However, she does not described cataplexy symptoms when discussed today.   She has vivid dreams and occasional hypnagogic hallucinations and sleep paralysis.   She is very sleepy during the day.   Modafinil helped some.     She also has migraine headaches , better with topiramate.   She has some  depression helped by duloxetine.       She also has had esophageal strictures for dysphagia and has had 2 procedures.   Symptoms worsened after a high dose course of steroids.      I personally reviewed several MRIs: MRI of the cervical spine 03/07/2018 and 05/15/2018 show a couple T2 hyperintense lesions within the spinal cord.  One to the left adjacent to C2-C3 and 1 to the right adjacent to C4.    MRI of the brain 05/15/2018 and 03/07/2018 show multiple T2/flair hyperintense foci.  Somewhat nonspecific but a few are periventricular and juxtacortical.  There is no change between the 2 studies.    MRI of the thoracic spine 05/16/2015 did not show any definite lesions.   Hemangiomas within the T2  and T6 vertebral body.  ESS 08/24/2019 EPWORTH SLEEPINESS SCALE  On a scale of 0 - 3 what is the chance of dozing:  Sitting and Reading:   3 Watching TV:    3 Sitting inactive in a public place: 0 Passenger in car for one hour: 3 Lying down to rest in the afternoon: 3 Sitting and talking to someone: 2 Sitting quietly after lunch:  3 In a car, stopped in traffic:  1  Total (out of 24):  18/24   Moderate sleepiness.    REVIEW OF SYSTEMS: Constitutional: No fevers, chills, sweats, or change in appetite Eyes: No visual changes, double vision, eye pain Ear, nose and throat: No hearing loss, ear pain, nasal congestion, sore throat Cardiovascular: No chest pain, palpitations Respiratory: No shortness of breath at rest or with exertion.   No wheezes GastrointestinaI: No nausea, vomiting, diarrhea, abdominal pain, fecal incontinence Genitourinary: No dysuria, urinary retention or frequency.  No nocturia. Musculoskeletal: No neck pain, back pain Integumentary: No rash, pruritus, skin lesions Neurological: as above Psychiatric: No depression at this time.  No anxiety Endocrine: No palpitations, diaphoresis, change in appetite, change in weigh or increased thirst Hematologic/Lymphatic: No anemia, purpura, petechiae. Allergic/Immunologic: No itchy/runny eyes, nasal congestion, recent allergic reactions, rashes  ALLERGIES: Allergies  Allergen Reactions  . Lisinopril Hives    HOME MEDICATIONS:  Current Outpatient Medications:  .  baclofen (LIORESAL) 20 MG tablet, Take 10 mg by mouth as needed. , Disp: , Rfl:  .  cetirizine (ZYRTEC) 10 MG tablet, Take 10 mg by mouth daily., Disp: , Rfl:  .  Cholecalciferol (VITAMIN D3) 1000 UNITS CAPS, Take 2 capsules by mouth daily. , Disp: , Rfl:  .  DULoxetine (CYMBALTA) 30 MG capsule, Take 30 mg by mouth. TAKE 3 CAPSULES ONCE DAILY, Disp: , Rfl:  .  fluticasone (FLONASE) 50 MCG/ACT nasal spray, Place 2 sprays into both nostrils daily., Disp:  16 g, Rfl: 2 .  modafinil (PROVIGIL) 200 MG tablet, Take 1 tablet (200 mg total) by mouth daily., Disp: 90 tablet, Rfl: 1 .  natalizumab (TYSABRI) 300 MG/15ML injection, Inject into the vein., Disp: , Rfl:  .  pantoprazole (PROTONIX) 40 MG tablet, Take 1 tablet (40 mg total) by mouth daily., Disp: 90 tablet, Rfl: 2 .  Polyethylene Glycol POWD, Take 17 g by mouth daily. (Patient taking differently: Take 17 g by mouth as needed. ), Disp: 527 g, Rfl: 12 .  predniSONE (DELTASONE) 50 MG tablet, 10 tablets p.o. daily x3 days for MS relapse, Disp: 30 tablet, Rfl: 0 .  solifenacin (VESICARE) 10 MG tablet, Take 1 tablet (10 mg total) by mouth daily., Disp: 90 tablet, Rfl: 4 .  topiramate (TOPAMAX) 50 MG tablet,  Take 50 mg by mouth 2 (two) times daily., Disp: , Rfl:   PAST MEDICAL HISTORY: Past Medical History:  Diagnosis Date  . Constipation   . Cystitis bacillary, chronic   . Depression   . Dysphagia    Dx by Beverely RisenFozia Khan on 10/15/14  . Eosinophilic esophagitis 11/19/2014   GE junction benign stricture s/p balloon dilation, retained food contents. ?gastroparesis versus secondary to EE.  Marland Kitchen. Esophageal dysphagia   . GERD (gastroesophageal reflux disease)   . Heartburn   . Hypertension   . Multilevel degenerative disc disease   . Multiple sclerosis (HCC)   . Multiple sclerosis (HCC) 07/16/2015    PAST SURGICAL HISTORY: Past Surgical History:  Procedure Laterality Date  . ABDOMINAL HYSTERECTOMY  2003  . baclofen pump insertion Right 01/04/2018  . CARPAL TUNNEL RELEASE Right 1997  . CESAREAN SECTION  1989  . CYSTECTOMY  2002   From chest  . ESOPHAGOGASTRODUODENOSCOPY (EGD) WITH PROPOFOL N/A 05/19/2016   Procedure: ESOPHAGOGASTRODUODENOSCOPY (EGD) WITH PROPOFOL;  Surgeon: Midge Miniumarren Wohl, MD;  Location: [facility] ENDOSCOPY;  Service: Endoscopy;  Laterality: N/A;  . KIDNEY DONATION Left 2002  . OVARIAN CYST REMOVAL Left 2002   Ovarian mass removal  . TONSILLECTOMY  1973  . UPPER GASTROINTESTINAL  ENDOSCOPY  11/19/2014   Benign appearing esophageal stricture-Dilated.    FAMILY HISTORY: Family History  Problem Relation Age of Onset  . Diabetes Mother   . Hypertension Mother   . Heart disease Mother   . Pulmonary embolism Mother   . Diabetes Father   . Diabetes Sister   . Hypertension Sister   . Heart disease Sister   . Bipolar disorder Sister   . Narcolepsy Brother   . Heart attack Maternal Grandmother   . Pancreatic cancer Maternal Grandfather     SOCIAL HISTORY:  Social History   Socioeconomic History  . Marital status: Married    Spouse name: Stephannie PetersMilton  . Number of children: 1  . Years of education: Not on file  . Highest education level: Not on file  Occupational History  . Occupation: Disability  Tobacco Use  . Smoking status: Never Smoker  . Smokeless tobacco: Never Used  Substance and Sexual Activity  . Alcohol use: No    Alcohol/week: 0.0 standard drinks  . Drug use: No  . Sexual activity: Not Currently  Other Topics Concern  . Not on file  Social History Narrative   Lives with husband, Jarvis NewcomerMilton Verdi   Right handed    Caffeine use: tea sometimes (mostly herbal)   Social Determinants of Health   Financial Resource Strain:   . Difficulty of Paying Living Expenses: Not on file  Food Insecurity:   . Worried About Programme researcher, broadcasting/film/videounning Out of Food in the Last Year: Not on file  . Ran Out of Food in the Last Year: Not on file  Transportation Needs:   . Lack of Transportation (Medical): Not on file  . Lack of Transportation (Non-Medical): Not on file  Physical Activity:   . Days of Exercise per Week: Not on file  . Minutes of Exercise per Session: Not on file  Stress:   . Feeling of Stress : Not on file  Social Connections:   . Frequency of Communication with Friends and Family: Not on file  . Frequency of Social Gatherings with Friends and Family: Not on file  . Attends Religious Services: Not on file  . Active Member of Clubs or Organizations: Not on file  .  Attends Club or  Organization Meetings: Not on file  . Marital Status: Not on file  Intimate Partner Violence:   . Fear of Current or Ex-Partner: Not on file  . Emotionally Abused: Not on file  . Physically Abused: Not on file  . Sexually Abused: Not on file     PHYSICAL EXAM  There were no vitals filed for this visit.  There is no height or weight on file to calculate BMI.   General: The patient is well-developed and well-nourished and in no acute distress  Skin: Extremities are without rash or edema.  Musculoskeletal:  Back is nontender  Neurologic Exam  Mental status: The patient is alert and oriented x 3 at the time of the examination. The patient has apparent normal recent and remote memory, with an apparently normal attention span and concentration ability.   Speech is normal.  Cranial nerves: Extraocular movements are full.  Facial strength was normal.  No obvious hearing deficits are noted.  Motor:  Muscle bulk is normal.   Muscle tone is increased, left greater than right leg.  She had reduced rapid altering movements in the left arm and strength was 4/5 in the triceps and 5/5 elsewhere.  Strength was 4 to 4+/5 in the right leg (worse)and 4/5 in the left leg.  Sensory: Sensory testing is intact to pinprick, soft touch and vibration sensation in all 4 extremities.  Coordination: Cerebellar testing reveals good finger-nose-finger and heel-to-shin bilaterally.  Gait and station: Station is normal.   She has a spastic gait and cannot tandem walk.. Romberg is negative.   Reflexes: Deep tendon reflexes are increased in the legs, left greater than right and there are crossed abductor reflexes.  Slight asymmetry in the arms.     DIAGNOSTIC DATA (LABS, IMAGING, TESTING) - I reviewed patient records, labs, notes, testing and imaging myself where available.  Lab Results  Component Value Date   WBC 5.7 08/24/2019   HGB 12.4 08/24/2019   HCT 39.8 08/24/2019   MCV 95  08/24/2019   PLT 309 08/24/2019      Component Value Date/Time   NA 138 02/15/2019 1500   NA 139 06/22/2018 1345   K 4.1 02/15/2019 1500   CL 105 02/15/2019 1500   CO2 25 02/15/2019 1500   GLUCOSE 101 (H) 02/15/2019 1500   BUN 15 02/15/2019 1500   BUN 10 06/22/2018 1345   CREATININE 0.93 02/15/2019 1500   CALCIUM 9.2 02/15/2019 1500   PROT 7.0 02/15/2019 1500   PROT 6.8 06/22/2018 1345   ALBUMIN 4.1 02/15/2019 1500   ALBUMIN 4.3 06/22/2018 1345   AST 15 02/15/2019 1500   ALT 9 02/15/2019 1500   ALKPHOS 73 02/15/2019 1500   BILITOT 0.6 02/15/2019 1500   BILITOT 0.5 06/22/2018 1345   GFRNONAA >60 02/15/2019 1500   GFRAA >60 02/15/2019 1500   Lab Results  Component Value Date   CHOL 236 (H) 06/22/2018   HDL 69 06/22/2018   LDLCALC 146 (H) 06/22/2018   TRIG 103 06/22/2018   No results found for: HGBA1C No results found for: VITAMINB12 Lab Results  Component Value Date   TSH 1.590 06/22/2018       ASSESSMENT AND PLAN  Multiple sclerosis (HCC)  Difficulty in walking  Weakness of both lower extremities  1.   She will be rescheduled for her Tysabri infusion due to difficulties with IV.  If she continues to have problem with getting an IV we will need to consider a central line. 2.  She appears to be having an MS exacerbation with more symptoms in the right leg.  I will send in a high-dose oral prednisone dose (5 mg x 3 days) 3.   Stay active and exercise as tolerated. 4.    Return for follow-up as scheduled or call sooner if new or worsening neurologic symptoms.     Aemon Koeller A. Epimenio Foot, MD, [facility] 11/21/2019, 12:58 PM Certified in Neurology, Clinical Neurophysiology, Sleep Medicine and Neuroimaging  [facility] [address] [place], [place] [address] [phone]

## 2019-11-22 ENCOUNTER — Encounter: Payer: Medicare Other | Admitting: Adult Health

## 2019-11-28 DIAGNOSIS — G35 Multiple sclerosis: Secondary | ICD-10-CM | POA: Diagnosis not present

## 2019-11-28 NOTE — Telephone Encounter (Signed)
Pt called in and stated she will be in today for her infusion at 2pm but the pain hasnt gotten any better nor has it lightened up. She is requesting a call back to discuss.

## 2019-11-28 NOTE — Telephone Encounter (Signed)
Called pt back. Relayed Dr. Gladstone Lighter message to pt. She verbalized understading.

## 2019-11-28 NOTE — Telephone Encounter (Signed)
Dr. Leta Baptist, do you have any recommendations? This is a Dr. Felecia Clements pt.

## 2019-11-28 NOTE — Telephone Encounter (Signed)
No other recs; recommend to complete tysabri and check in with dr sater next week. Hopefully she will start feeling better after her infusion. -VRP

## 2019-11-28 NOTE — Telephone Encounter (Signed)
Ok thank you. If no fever, go ahead with tysabri dosing. -VRP

## 2019-11-28 NOTE — Telephone Encounter (Signed)
Called pt. She was supposed to have Tysabri 11/21/2019 d/t poor IV access. Dr. Felecia Shelling saw her for visit instead. She is coming today for Tysabri at 2pm today. She finished prednisone pack that was called in on 11/21/2019 by Dr. Felecia Shelling. (Took last Wed/Thurs/Fri). Did not notice any improvement. She is still having weakness, flushed feeling (hot/cold) all over body, stiffness in legs. She fell this past Saturday and yesterday morning. She felt a little disoriented getting up to go to the bathroom in the morning. She just started taking vesicare. Has not started modafinil yet, just picked up rx.  Advised Dr. Felecia Shelling out and will send message to [facility].

## 2019-11-28 NOTE — Telephone Encounter (Signed)
Dr. Leta Baptist, pt is planning to come for Tysabri today. Her concern is about the pain she is still having after finishing the prednisone Dr. Felecia Shelling prescribed last week. She is wanting to know if you have any other recommendations?

## 2019-12-12 ENCOUNTER — Encounter: Payer: Medicare Other | Admitting: Adult Health

## 2020-01-01 ENCOUNTER — Other Ambulatory Visit: Payer: Self-pay | Admitting: Adult Health

## 2020-01-04 ENCOUNTER — Telehealth: Payer: Self-pay | Admitting: *Deleted

## 2020-01-04 ENCOUNTER — Telehealth: Payer: Self-pay

## 2020-01-04 NOTE — Telephone Encounter (Signed)
Marcelino Duster with [employer] called to check on the status of the patients reauthorization questionnaire for  natalizumab (TYSABRI) 300 MG/15ML injection was recieved

## 2020-01-04 NOTE — Telephone Encounter (Signed)
Faxed completed/signed Tysabri pt status report and reauth questionnaire to [facility] at [phone]. Received confirmation.  

## 2020-01-04 NOTE — Telephone Encounter (Signed)
Printed message and gave to intrafusion to f/u with pt.

## 2020-01-19 DIAGNOSIS — G35 Multiple sclerosis: Secondary | ICD-10-CM | POA: Diagnosis not present

## 2020-01-19 DIAGNOSIS — Z451 Encounter for adjustment and management of infusion pump: Secondary | ICD-10-CM | POA: Diagnosis not present

## 2020-01-19 DIAGNOSIS — Z79899 Other long term (current) drug therapy: Secondary | ICD-10-CM | POA: Diagnosis not present

## 2020-01-19 DIAGNOSIS — M62830 Muscle spasm of back: Secondary | ICD-10-CM | POA: Diagnosis not present

## 2020-01-30 ENCOUNTER — Telehealth: Payer: Self-pay

## 2020-01-30 NOTE — Telephone Encounter (Signed)
Called lmom informing patient of appointment on 02/01/2020. klh 

## 2020-02-01 ENCOUNTER — Encounter: Payer: Self-pay | Admitting: Adult Health

## 2020-02-01 ENCOUNTER — Telehealth: Payer: Self-pay

## 2020-02-01 ENCOUNTER — Ambulatory Visit (INDEPENDENT_AMBULATORY_CARE_PROVIDER_SITE_OTHER): Payer: Medicare Other | Admitting: Adult Health

## 2020-02-01 ENCOUNTER — Other Ambulatory Visit: Payer: Self-pay

## 2020-02-01 VITALS — BP 150/90 | HR 70 | Temp 97.4°F | Resp 16 | Ht 65.0 in | Wt 211.0 lb

## 2020-02-01 DIAGNOSIS — E079 Disorder of thyroid, unspecified: Secondary | ICD-10-CM

## 2020-02-01 DIAGNOSIS — Z1231 Encounter for screening mammogram for malignant neoplasm of breast: Secondary | ICD-10-CM | POA: Diagnosis not present

## 2020-02-01 DIAGNOSIS — K219 Gastro-esophageal reflux disease without esophagitis: Secondary | ICD-10-CM

## 2020-02-01 DIAGNOSIS — R300 Dysuria: Secondary | ICD-10-CM

## 2020-02-01 DIAGNOSIS — G35 Multiple sclerosis: Secondary | ICD-10-CM | POA: Diagnosis not present

## 2020-02-01 DIAGNOSIS — Z0001 Encounter for general adult medical examination with abnormal findings: Secondary | ICD-10-CM

## 2020-02-01 DIAGNOSIS — M19072 Primary osteoarthritis, left ankle and foot: Secondary | ICD-10-CM

## 2020-02-01 DIAGNOSIS — I10 Essential (primary) hypertension: Secondary | ICD-10-CM

## 2020-02-01 DIAGNOSIS — F331 Major depressive disorder, recurrent, moderate: Secondary | ICD-10-CM

## 2020-02-01 MED ORDER — PANTOPRAZOLE SODIUM 40 MG PO TBEC: 40.0000 mg | DELAYED_RELEASE_TABLET | Freq: Every day | ORAL | 1 refills | Status: DC

## 2020-02-01 MED ORDER — DULOXETINE HCL 30 MG PO CPEP: 90.0000 mg | ORAL_CAPSULE | Freq: Every day | ORAL | 1 refills | Status: DC

## 2020-02-01 NOTE — Progress Notes (Signed)
[facility] [place], [place] [address]  Internal MEDICINE  Office Visit Note  Patient Name: Rose Clements  [id number]  [id number]  Date of Service: 02/01/2020  Chief Complaint  Patient presents with  . Medicare Wellness  . Hypertension  . Gastroesophageal Reflux  . Arthritis  . Depression     HPI Pt is here for routine health maintenance examination.  Pt is a well appearing 59 yo female.  She denies any complaints at this time.  She has a history of HTN, MS, GERD, arthritis and depression. She reports her arthritis and depression are well controlled at this time.  She denies any new or worsening symptoms of GERD.  Her bp is slightly elevated initially today 150/90.   She has a 1.4 cm nodule on her thyroid found  Incidentally on her  Cervical spine MRI.  She needs an ultrasound for further evaluation of this.  She is up to date on PHM at this time.  She is due for colonoscopy later this year.  She would like to wait at this time.    Current Medication: Outpatient Encounter Medications as of 02/01/2020  Medication Sig Note  . baclofen (LIORESAL) 20 MG tablet Take 10 mg by mouth as needed.    . cetirizine (ZYRTEC) 10 MG tablet Take 10 mg by mouth daily.   . Cholecalciferol (VITAMIN D3) 1000 UNITS CAPS Take 2 capsules by mouth daily.    . DULoxetine (CYMBALTA) 30 MG capsule Take 30 mg by mouth. TAKE 3 CAPSULES ONCE DAILY   . fluticasone (FLONASE) 50 MCG/ACT nasal spray Place 2 sprays into both nostrils daily.   . modafinil (PROVIGIL) 200 MG tablet Take 1 tablet (200 mg total) by mouth daily.   . natalizumab (TYSABRI) 300 MG/15ML injection Inject into the vein. 09/04/2019: JCV drawn on 08/24/19 indeterminate, index: 0.24. Inhibition assay: negative  . pantoprazole (PROTONIX) 40 MG tablet TAKE 1 TABLET BY MOUTH EVERY DAY   . Polyethylene Glycol POWD Take 17 g by mouth daily. (Patient taking differently: Take 17 g by mouth as needed. )   . solifenacin (VESICARE) 10  MG tablet Take 1 tablet (10 mg total) by mouth daily.   Marland Kitchen topiramate (TOPAMAX) 50 MG tablet Take 50 mg by mouth 2 (two) times daily.   . [DISCONTINUED] predniSONE (DELTASONE) 50 MG tablet 10 tablets p.o. daily x3 days for MS relapse (Patient not taking: Reported on 02/01/2020)    No facility-administered encounter medications on file as of 02/01/2020.    Surgical History: Past Surgical History:  Procedure Laterality Date  . ABDOMINAL HYSTERECTOMY  2003  . baclofen pump insertion Right 01/04/2018  . CARPAL TUNNEL RELEASE Right 1997  . CESAREAN SECTION  1989  . CYSTECTOMY  2002   From chest  . ESOPHAGOGASTRODUODENOSCOPY (EGD) WITH PROPOFOL N/A 05/19/2016   Procedure: ESOPHAGOGASTRODUODENOSCOPY (EGD) WITH PROPOFOL;  Surgeon: Lucilla Lame, MD;  Location: [facility] ENDOSCOPY;  Service: Endoscopy;  Laterality: N/A;  . KIDNEY DONATION Left 2002  . OVARIAN CYST REMOVAL Left 2002   Ovarian mass removal  . TONSILLECTOMY  1973  . UPPER GASTROINTESTINAL ENDOSCOPY  11/19/2014   Benign appearing esophageal stricture-Dilated.    Medical History: Past Medical History:  Diagnosis Date  . Constipation   . Cystitis bacillary, chronic   . Depression   . Dysphagia    Dx by Clayborn Bigness on 10/15/14  . Eosinophilic esophagitis 16/06/3709   GE junction benign stricture s/p balloon dilation, retained food contents. ?gastroparesis versus secondary to  EE.  . Esophageal dysphagia   . GERD (gastroesophageal reflux disease)   . Heartburn   . Hypertension   . Multilevel degenerative disc disease   . Multiple sclerosis (HCC)   . Multiple sclerosis (HCC) 07/16/2015    Family History: Family History  Problem Relation Age of Onset  . Diabetes Mother   . Hypertension Mother   . Heart disease Mother   . Pulmonary embolism Mother   . Diabetes Father   . Diabetes Sister   . Hypertension Sister   . Heart disease Sister   . Bipolar disorder Sister   . Narcolepsy Brother   . Heart attack Maternal Grandmother   .  Pancreatic cancer Maternal Grandfather       Review of Systems  Constitutional: Negative for chills, fatigue and unexpected weight change.  HENT: Negative for congestion, rhinorrhea, sneezing and sore throat.   Eyes: Negative for photophobia, pain and redness.  Respiratory: Negative for cough, chest tightness and shortness of breath.   Cardiovascular: Negative for chest pain and palpitations.  Gastrointestinal: Negative for abdominal pain, constipation, diarrhea, nausea and vomiting.  Endocrine: Negative.   Genitourinary: Negative for dysuria and frequency.  Musculoskeletal: Negative for arthralgias, back pain, joint swelling and neck pain.  Skin: Negative for rash.  Allergic/Immunologic: Negative.   Neurological: Negative for tremors and numbness.  Hematological: Negative for adenopathy. Does not bruise/bleed easily.  Psychiatric/Behavioral: Negative for behavioral problems and sleep disturbance. The patient is not nervous/anxious.      Vital Signs: BP (!) 150/90   Pulse 70   Temp (!) 97.4 F (36.3 C)   Resp 16   Ht 5\' 5"  (1.651 m)   Wt 211 lb (95.7 kg)   SpO2 98%   BMI 35.11 kg/m    Physical Exam Vitals and nursing note reviewed.  Constitutional:      General: She is not in acute distress.    Appearance: She is well-developed. She is not diaphoretic.  HENT:     Head: Normocephalic and atraumatic.     Mouth/Throat:     Pharynx: No oropharyngeal exudate.  Eyes:     Pupils: Pupils are equal, round, and reactive to light.  Neck:     Thyroid: No thyromegaly.     Vascular: No JVD.     Trachea: No tracheal deviation.  Cardiovascular:     Rate and Rhythm: Normal rate and regular rhythm.     Heart sounds: Normal heart sounds. No murmur. No friction rub. No gallop.   Pulmonary:     Effort: Pulmonary effort is normal. No respiratory distress.     Breath sounds: Normal breath sounds. No wheezing or rales.  Chest:     Chest wall: No tenderness.     Breasts:         Right: Normal.        Left: Normal.     Comments: Exam Chaperoned by CMA Abdominal:     Palpations: Abdomen is soft.     Tenderness: There is no abdominal tenderness. There is no guarding.  Musculoskeletal:        General: Normal range of motion.     Cervical back: Normal range of motion and neck supple.  Lymphadenopathy:     Cervical: No cervical adenopathy.  Skin:    General: Skin is warm and dry.  Neurological:     Mental Status: She is alert and oriented to person, place, and time.     Cranial Nerves: No cranial nerve deficit.  Psychiatric:        Behavior: Behavior normal.        Thought Content: Thought content normal.        Judgment: Judgment normal.     LABS: No results found for this or any previous visit (from the past 2160 hour(s)).    Assessment/Plan: 1. Encounter for general adult medical examination with abnormal findings UP to date on PHM, except Mammogram, which is ordered at this visit. - CBC with Differential/Platelet - Lipid Panel With LDL/HDL Ratio - TSH - T4, free - Comprehensive metabolic panel  2. Hypertension, unspecified type Stable, continue current management.  3. Multiple sclerosis (HCC) Stable,continue to follow neurology recommendations.  4. Encounter for screening mammogram for malignant neoplasm of breast Mammogram ordered - MM DIGITAL SCREENING BILATERAL; Future  5. Thyroid mass Thyroid nodule noted on MRI, will get [place] for eval. - US THYROID; Future  6. Arthritis of ankle, left continues to have intermittent pain, continue current mgmt.  7. Dysuria - UA/M w/rflx Culture, Routine  8. Gastroesophageal reflux disease without esophagitis Continue Protonix as directed. - pantoprazole (PROTONIX) 40 MG tablet; Take 1 tablet (40 mg total) by mouth daily.  Dispense: 90 tablet; Refill: 1  9. Major depressive disorder, recurrent episode, moderate (HCC) Continue to take Cymbalta as directed. - DULoxetine (CYMBALTA) 30  MG capsule; Take 3 capsules (90 mg total) by mouth daily. TAKE 3 CAPSULES ONCE DAILY  Dispense: 270 capsule; Refill: 1  General Counseling: Saryah verbalizes understanding of the findings of todays visit and agrees with plan of treatment. I have discussed any further diagnostic evaluation that may be needed or ordered today. We also reviewed her medications today. she has been encouraged to call the office with any questions or concerns that should arise related to todays visit.   Orders Placed This Encounter  Procedures  . UA/M w/rflx Culture, Routine    No orders of the defined types were placed in this encounter.   Time spent: 25 Minutes   This patient was seen by Blima Ledger AGNP-C in Collaboration with Dr Lyndon Code as a part of collaborative care agreement    Johnna Acosta AGNP-C Internal Medicine

## 2020-02-01 NOTE — Telephone Encounter (Signed)
CONFIRMED AND SCREENED FOR 02-01-20 OV. 

## 2020-02-07 ENCOUNTER — Other Ambulatory Visit: Payer: Self-pay | Admitting: Adult Health

## 2020-02-07 DIAGNOSIS — E039 Hypothyroidism, unspecified: Secondary | ICD-10-CM | POA: Diagnosis not present

## 2020-02-07 DIAGNOSIS — Z0001 Encounter for general adult medical examination with abnormal findings: Secondary | ICD-10-CM | POA: Diagnosis not present

## 2020-02-07 DIAGNOSIS — E782 Mixed hyperlipidemia: Secondary | ICD-10-CM | POA: Diagnosis not present

## 2020-02-08 ENCOUNTER — Telehealth: Payer: Self-pay | Admitting: *Deleted

## 2020-02-08 DIAGNOSIS — G35 Multiple sclerosis: Secondary | ICD-10-CM | POA: Diagnosis not present

## 2020-02-08 LAB — LIPID PANEL WITH LDL/HDL RATIO
Cholesterol, Total: 222 mg/dL — ABNORMAL HIGH (ref 100–199)
HDL: 66 mg/dL (ref >39)
LDL Chol Calc (NIH): 144 mg/dL — ABNORMAL HIGH (ref 0–99)
LDL/HDL Ratio: 2.2 ratio (ref 0.0–3.2)
Triglycerides: 71 mg/dL (ref 0–149)
VLDL Cholesterol Cal: 12 mg/dL (ref 5–40)

## 2020-02-08 LAB — CBC WITH DIFFERENTIAL/PLATELET
Basophils Absolute: 0 10*3/uL (ref 0.0–0.2)
Basos: 1 %
EOS (ABSOLUTE): 0.1 10*3/uL (ref 0.0–0.4)
Eos: 2 %
Hematocrit: 36.6 % (ref 34.0–46.6)
Hemoglobin: 12 g/dL (ref 11.1–15.9)
Immature Grans (Abs): 0 10*3/uL (ref 0.0–0.1)
Immature Granulocytes: 0 %
Lymphocytes Absolute: 2.3 10*3/uL (ref 0.7–3.1)
Lymphs: 54 %
MCH: 30.5 pg (ref 26.6–33.0)
MCHC: 32.8 g/dL (ref 31.5–35.7)
MCV: 93 fL (ref 79–97)
Monocytes Absolute: 0.3 10*3/uL (ref 0.1–0.9)
Monocytes: 7 %
Neutrophils Absolute: 1.6 10*3/uL (ref 1.4–7.0)
Neutrophils: 36 %
Platelets: 273 10*3/uL (ref 150–450)
RBC: 3.94 x10E6/uL (ref 3.77–5.28)
RDW: 11.8 % (ref 11.7–15.4)
WBC: 4.4 10*3/uL (ref 3.4–10.8)

## 2020-02-08 LAB — COMPREHENSIVE METABOLIC PANEL
ALT: 5 IU/L (ref 0–32)
AST: 14 IU/L (ref 0–40)
Albumin/Globulin Ratio: 1.8 (ref 1.2–2.2)
Albumin: 4.4 g/dL (ref 3.8–4.9)
Alkaline Phosphatase: 88 IU/L (ref 39–117)
BUN/Creatinine Ratio: 9 (ref 9–23)
BUN: 9 mg/dL (ref 6–24)
Bilirubin Total: 0.5 mg/dL (ref 0.0–1.2)
CO2: 20 mmol/L (ref 20–29)
Calcium: 9.4 mg/dL (ref 8.7–10.2)
Chloride: 107 mmol/L — ABNORMAL HIGH (ref 96–106)
Creatinine, Ser: 0.96 mg/dL (ref 0.57–1.00)
GFR calc Af Amer: 76 mL/min/{1.73_m2} (ref >59)
GFR calc non Af Amer: 66 mL/min/{1.73_m2} (ref >59)
Globulin, Total: 2.4 g/dL (ref 1.5–4.5)
Glucose: 87 mg/dL (ref 65–99)
Potassium: 4.6 mmol/L (ref 3.5–5.2)
Sodium: 143 mmol/L (ref 134–144)
Total Protein: 6.8 g/dL (ref 6.0–8.5)

## 2020-02-08 LAB — TSH: TSH: 1.35 u[IU]/mL (ref 0.450–4.500)

## 2020-02-08 LAB — T4, FREE: Free T4: 0.98 ng/dL (ref 0.82–1.77)

## 2020-02-08 NOTE — Telephone Encounter (Signed)
Pt here for infusion today. She provided MRI cervical/brain CD done 01/19/20 at [facility]. I gave to Dr. Epimenio Foot to review. Pt does not need CD back.

## 2020-02-09 LAB — URINE CULTURE, REFLEX

## 2020-02-09 LAB — UA/M W/RFLX CULTURE, ROUTINE
Bilirubin, UA: NEGATIVE
Glucose, UA: NEGATIVE
Ketones, UA: NEGATIVE
Nitrite, UA: NEGATIVE
Protein,UA: NEGATIVE
RBC, UA: NEGATIVE
Specific Gravity, UA: 1.014 (ref 1.005–1.030)
Urobilinogen, Ur: 0.2 mg/dL (ref 0.2–1.0)
pH, UA: 5.5 (ref 5.0–7.5)

## 2020-02-09 LAB — MICROSCOPIC EXAMINATION: Casts: NONE SEEN /lpf

## 2020-02-12 ENCOUNTER — Telehealth: Payer: Self-pay | Admitting: *Deleted

## 2020-02-12 NOTE — Telephone Encounter (Signed)
Called pt. Relayed Dr. Bonnita Hollow message. She verbalized understanding. She already saw results on Mychart and spoke with PCP. They ordered US thyroid and she is scheduled already for this test this Friday. She will call back if any further questions/concerns.

## 2020-02-12 NOTE — Telephone Encounter (Signed)
-----   Message from Asa Lente, MD sent at 02/08/2020  8:38 PM EST ----- Regarding: MRI Please let her know that the MRIs performed at [facility] do not show any new MS lesions lesions.  However, she does have a cyst on the right side of the thyroid.  It was present on the previous MRI but appears larger on the current one.   it looks like it is just a fluid-filled cyst but we need to have her get an ultrasound of the thyroid to determine the characteristics to see if it needs to have a biopsy.

## 2020-02-14 ENCOUNTER — Telehealth: Payer: Self-pay

## 2020-02-14 ENCOUNTER — Other Ambulatory Visit: Payer: Self-pay | Admitting: Adult Health

## 2020-02-14 DIAGNOSIS — Z1231 Encounter for screening mammogram for malignant neoplasm of breast: Secondary | ICD-10-CM

## 2020-02-14 NOTE — Telephone Encounter (Signed)
Confirmed appointment on 02/16/2020 and screened for covid. klh 

## 2020-02-16 ENCOUNTER — Ambulatory Visit: Payer: Medicare Other

## 2020-02-16 ENCOUNTER — Other Ambulatory Visit: Payer: Self-pay

## 2020-02-16 DIAGNOSIS — E079 Disorder of thyroid, unspecified: Secondary | ICD-10-CM

## 2020-02-27 ENCOUNTER — Telehealth: Payer: Self-pay

## 2020-02-27 NOTE — Telephone Encounter (Signed)
Confirmed appointment on 02/29/2020 and screened for covid. klh  °

## 2020-02-29 ENCOUNTER — Other Ambulatory Visit: Payer: Self-pay

## 2020-02-29 ENCOUNTER — Encounter: Payer: Self-pay | Admitting: Adult Health

## 2020-02-29 ENCOUNTER — Ambulatory Visit (INDEPENDENT_AMBULATORY_CARE_PROVIDER_SITE_OTHER): Payer: Medicare Other | Admitting: Adult Health

## 2020-02-29 VITALS — BP 140/92 | HR 78 | Temp 97.5°F | Resp 16 | Ht 65.0 in | Wt 205.0 lb

## 2020-02-29 DIAGNOSIS — E782 Mixed hyperlipidemia: Secondary | ICD-10-CM

## 2020-02-29 DIAGNOSIS — E041 Nontoxic single thyroid nodule: Secondary | ICD-10-CM

## 2020-02-29 NOTE — Progress Notes (Signed)
[facility] [address] [place], [place] [address]  Internal MEDICINE  Office Visit Note  Patient Name: Rose Clements  [id number]  [id number]  Date of Service: 02/29/2020  Chief Complaint  Patient presents with  . Follow-up    review labs   . Gastroesophageal Reflux  . Hypertension    HPI Patient is here today to follow-up on her recent lab work and thyroid US. Blood work looks good, total cholesterol as well as her LDL levels have improved. She has been focusing on improving her eating habits and getting more exercise. Thyroid US revealed 2 colloid cysts on her right lobe and a 1 CM left lobe nodule, per guidelines this will need to be followed up with repeat US in 6-12 months. No complaints today. Denies chest pain, shortness of breath or palpitations.    Current Medication: Outpatient Encounter Medications as of 02/29/2020  Medication Sig Note  . baclofen (LIORESAL) 20 MG tablet Take 10 mg by mouth as needed.    . cetirizine (ZYRTEC) 10 MG tablet Take 10 mg by mouth daily.   . Cholecalciferol (VITAMIN D3) 1000 UNITS CAPS Take 2 capsules by mouth daily.    . DULoxetine (CYMBALTA) 30 MG capsule Take 3 capsules (90 mg total) by mouth daily. TAKE 3 CAPSULES ONCE DAILY   . fluticasone (FLONASE) 50 MCG/ACT nasal spray Place 2 sprays into both nostrils daily.   . modafinil (PROVIGIL) 200 MG tablet Take 1 tablet (200 mg total) by mouth daily.   . natalizumab (TYSABRI) 300 MG/15ML injection Inject into the vein. 09/04/2019: JCV drawn on 08/24/19 indeterminate, index: 0.24. Inhibition assay: negative  . pantoprazole (PROTONIX) 40 MG tablet Take 1 tablet (40 mg total) by mouth daily.   . Polyethylene Glycol POWD Take 17 g by mouth daily. (Patient taking differently: Take 17 g by mouth as needed. )   . solifenacin (VESICARE) 10 MG tablet Take 1 tablet (10 mg total) by mouth daily.   Marland Kitchen topiramate (TOPAMAX) 50 MG tablet Take 50 mg by mouth 2 (two) times daily.    No  facility-administered encounter medications on file as of 02/29/2020.    Surgical History: Past Surgical History:  Procedure Laterality Date  . ABDOMINAL HYSTERECTOMY  2003  . baclofen pump insertion Right 01/04/2018  . CARPAL TUNNEL RELEASE Right 1997  . CESAREAN SECTION  1989  . CYSTECTOMY  2002   From chest  . ESOPHAGOGASTRODUODENOSCOPY (EGD) WITH PROPOFOL N/A 05/19/2016   Procedure: ESOPHAGOGASTRODUODENOSCOPY (EGD) WITH PROPOFOL;  Surgeon: [name], MD;  Location: [facility] ENDOSCOPY;  Service: Endoscopy;  Laterality: N/A;  . KIDNEY DONATION Left 2002  . OVARIAN CYST REMOVAL Left 2002   Ovarian mass removal  . TONSILLECTOMY  1973  . UPPER GASTROINTESTINAL ENDOSCOPY  11/19/2014   Benign appearing esophageal stricture-Dilated.    Medical History: Past Medical History:  Diagnosis Date  . Constipation   . Cystitis bacillary, chronic   . Depression   . Dysphagia    Dx by Beverely Risen on 10/15/14  . Eosinophilic esophagitis 11/19/2014   GE junction benign stricture s/p balloon dilation, retained food contents. ?gastroparesis versus secondary to EE.  Marland Kitchen Esophageal dysphagia   . GERD (gastroesophageal reflux disease)   . Heartburn   . Hypertension   . Multilevel degenerative disc disease   . Multiple sclerosis (HCC)   . Multiple sclerosis (HCC) 07/16/2015    Family History: Family History  Problem Relation Age of Onset  . Diabetes Mother   . Hypertension Mother   .  Heart disease Mother   . Pulmonary embolism Mother   . Diabetes Father   . Diabetes Sister   . Hypertension Sister   . Heart disease Sister   . Bipolar disorder Sister   . Narcolepsy Brother   . Heart attack Maternal Grandmother   . Pancreatic cancer Maternal Grandfather     Social History   Socioeconomic History  . Marital status: Married    Spouse name: [name]  . Number of children: 1  . Years of education: Not on file  . Highest education level: Not on file  Occupational History  . Occupation:  Disability  Tobacco Use  . Smoking status: Never Smoker  . Smokeless tobacco: Never Used  Substance and Sexual Activity  . Alcohol use: No    Alcohol/week: 0.0 standard drinks  . Drug use: No  . Sexual activity: Not Currently  Other Topics Concern  . Not on file  Social History Narrative   Lives with husband, Rhanda Lemire   Right handed    Caffeine use: tea sometimes (mostly herbal)   Social Determinants of Health   Financial Resource Strain:   . Difficulty of Paying Living Expenses:   Food Insecurity:   . Worried About Charity fundraiser in the Last Year:   . Arboriculturist in the Last Year:   Transportation Needs:   . Film/video editor (Medical):   Marland Kitchen Lack of Transportation (Non-Medical):   Physical Activity:   . Days of Exercise per Week:   . Minutes of Exercise per Session:   Stress:   . Feeling of Stress :   Social Connections:   . Frequency of Communication with Friends and Family:   . Frequency of Social Gatherings with Friends and Family:   . Attends Religious Services:   . Active Member of Clubs or Organizations:   . Attends Archivist Meetings:   Marland Kitchen Marital Status:   Intimate Partner Violence:   . Fear of Current or Ex-Partner:   . Emotionally Abused:   Marland Kitchen Physically Abused:   . Sexually Abused:       Review of Systems  Constitutional: Negative for chills, fatigue and unexpected weight change.  HENT: Negative for congestion, rhinorrhea, sneezing and sore throat.   Eyes: Negative for photophobia, pain and redness.  Respiratory: Negative for cough, chest tightness and shortness of breath.   Cardiovascular: Negative for chest pain and palpitations.  Gastrointestinal: Negative for abdominal pain, constipation, diarrhea, nausea and vomiting.  Endocrine: Negative.   Genitourinary: Negative for dysuria and frequency.  Musculoskeletal: Negative for arthralgias, back pain, joint swelling and neck pain.  Skin: Negative for rash.   Allergic/Immunologic: Negative.   Neurological: Negative for tremors and numbness.  Hematological: Negative for adenopathy. Does not bruise/bleed easily.  Psychiatric/Behavioral: Negative for behavioral problems and sleep disturbance. The patient is not nervous/anxious.     Vital Signs: BP (!) 140/92   Pulse 78   Temp (!) 97.5 F (36.4 C)   Resp 16   Ht 5\' 5"  (1.651 m)   Wt 205 lb (93 kg)   SpO2 98%   BMI 34.11 kg/m    Physical Exam Vitals and nursing note reviewed.  Constitutional:      General: She is not in acute distress.    Appearance: She is well-developed. She is not diaphoretic.  HENT:     Head: Normocephalic and atraumatic.     Mouth/Throat:     Pharynx: No oropharyngeal exudate.  Eyes:  Pupils: Pupils are equal, round, and reactive to light.  Neck:     Thyroid: Thyromegaly present.     Vascular: No JVD.     Trachea: No tracheal deviation.  Cardiovascular:     Rate and Rhythm: Normal rate and regular rhythm.     Heart sounds: Normal heart sounds. No murmur. No friction rub. No gallop.   Pulmonary:     Effort: Pulmonary effort is normal. No respiratory distress.     Breath sounds: Normal breath sounds. No wheezing or rales.  Chest:     Chest wall: No tenderness.  Abdominal:     Palpations: Abdomen is soft.     Tenderness: There is no abdominal tenderness. There is no guarding.  Musculoskeletal:        General: Normal range of motion.     Cervical back: Normal range of motion and neck supple.  Lymphadenopathy:     Cervical: No cervical adenopathy.  Skin:    General: Skin is warm and dry.  Neurological:     Mental Status: She is alert and oriented to person, place, and time.     Cranial Nerves: No cranial nerve deficit.  Psychiatric:        Behavior: Behavior normal.        Thought Content: Thought content normal.        Judgment: Judgment normal.    Assessment/Plan: 1. Mixed hyperlipidemia Lipid panel overall has improved since last blood  work. She has been focusing on healthy eating and exercise. Would like to continue with healthy lifestyle habit changes, will continue to monitor levels.  2. Left thyroid nodule Thyroid US revealed left lobe thyroid nodule measuring 1cm, will follow-up with repeat US in 6 months.  3. Colloid thyroid nodule Thyroid US revealed 2 colloid cystic nodules on right lobe of thyroid. Follow-up not recommended for these but will monitor on repeat US in 6 months.   General Counseling: Seraphine verbalizes understanding of the findings of todays visit and agrees with plan of treatment. I have discussed any further diagnostic evaluation that may be needed or ordered today. We also reviewed her medications today. she has been encouraged to call the office with any questions or concerns that should arise related to todays visit.    No orders of the defined types were placed in this encounter.   No orders of the defined types were placed in this encounter.   Time spent: 20 Minutes   This patient was seen by Blima Ledger AGNP-C in Collaboration with Dr Lyndon Code as a part of collaborative care agreement     Johnna Acosta AGNP-C Internal medicine

## 2020-03-01 ENCOUNTER — Ambulatory Visit
Admission: RE | Admit: 2020-03-01 | Discharge: 2020-03-01 | Disposition: A | Discharge status: Home / Self Care | Payer: Medicare Other | Source: Ambulatory Visit | Attending: Adult Health | Admitting: Adult Health

## 2020-03-01 DIAGNOSIS — Z1231 Encounter for screening mammogram for malignant neoplasm of breast: Secondary | ICD-10-CM | POA: Diagnosis not present

## 2020-03-06 ENCOUNTER — Other Ambulatory Visit: Payer: Self-pay | Admitting: *Deleted

## 2020-03-06 DIAGNOSIS — Z79899 Other long term (current) drug therapy: Secondary | ICD-10-CM

## 2020-03-06 DIAGNOSIS — G35 Multiple sclerosis: Secondary | ICD-10-CM

## 2020-03-07 ENCOUNTER — Inpatient Hospital Stay
Admission: RE | Admit: 2020-03-07 | Discharge: 2020-03-07 | Disposition: A | Discharge status: Home / Self Care | Payer: Self-pay | Source: Ambulatory Visit | Attending: *Deleted | Admitting: *Deleted

## 2020-03-07 ENCOUNTER — Telehealth: Payer: Self-pay | Admitting: *Deleted

## 2020-03-07 ENCOUNTER — Other Ambulatory Visit: Payer: Self-pay | Admitting: *Deleted

## 2020-03-07 DIAGNOSIS — Z1231 Encounter for screening mammogram for malignant neoplasm of breast: Secondary | ICD-10-CM

## 2020-03-07 DIAGNOSIS — Z79899 Other long term (current) drug therapy: Secondary | ICD-10-CM | POA: Diagnosis not present

## 2020-03-07 DIAGNOSIS — G35 Multiple sclerosis: Secondary | ICD-10-CM | POA: Diagnosis not present

## 2020-03-07 NOTE — Addendum Note (Signed)
Addended by: Tamera Stands D on: 03/07/2020 01:56 PM   Modules accepted: Orders

## 2020-03-07 NOTE — Telephone Encounter (Signed)
Placed JCV lab in [facility] lock box for routine lab pick up. Results pending. 

## 2020-03-08 LAB — CBC WITH DIFFERENTIAL/PLATELET
Basophils Absolute: 0.1 10*3/uL (ref 0.0–0.2)
Basos: 1 %
EOS (ABSOLUTE): 0.2 10*3/uL (ref 0.0–0.4)
Eos: 2 %
Hematocrit: 36.3 % (ref 34.0–46.6)
Hemoglobin: 12 g/dL (ref 11.1–15.9)
Immature Grans (Abs): 0 10*3/uL (ref 0.0–0.1)
Immature Granulocytes: 0 %
Lymphocytes Absolute: 5 10*3/uL — ABNORMAL HIGH (ref 0.7–3.1)
Lymphs: 67 %
MCH: 30.7 pg (ref 26.6–33.0)
MCHC: 33.1 g/dL (ref 31.5–35.7)
MCV: 93 fL (ref 79–97)
Monocytes Absolute: 0.5 10*3/uL (ref 0.1–0.9)
Monocytes: 7 %
Neutrophils Absolute: 1.7 10*3/uL (ref 1.4–7.0)
Neutrophils: 23 %
Platelets: 288 10*3/uL (ref 150–450)
RBC: 3.91 x10E6/uL (ref 3.77–5.28)
RDW: 11.9 % (ref 11.7–15.4)
WBC: 7.5 10*3/uL (ref 3.4–10.8)

## 2020-03-15 LAB — STRATIFY JCV AB (W/ INDEX) W/ RFLX
Index Value: 0.19
Stratify JCV (TM) Ab w/Reflex Inhibition: NEGATIVE

## 2020-03-26 NOTE — Telephone Encounter (Signed)
JCV ab drawn on 03/07/2020 negative, index: 0.19

## 2020-04-11 DIAGNOSIS — G35 Multiple sclerosis: Secondary | ICD-10-CM | POA: Diagnosis not present

## 2020-04-15 ENCOUNTER — Ambulatory Visit (INDEPENDENT_AMBULATORY_CARE_PROVIDER_SITE_OTHER): Payer: Medicare Other | Admitting: Adult Health

## 2020-04-15 ENCOUNTER — Encounter: Payer: Self-pay | Admitting: Adult Health

## 2020-04-15 VITALS — BP 128/82 | HR 84 | Temp 98.4°F | Wt 194.0 lb

## 2020-04-15 DIAGNOSIS — J988 Other specified respiratory disorders: Secondary | ICD-10-CM

## 2020-04-15 DIAGNOSIS — I10 Essential (primary) hypertension: Secondary | ICD-10-CM | POA: Diagnosis not present

## 2020-04-15 DIAGNOSIS — T7840XS Allergy, unspecified, sequela: Secondary | ICD-10-CM

## 2020-04-15 DIAGNOSIS — K219 Gastro-esophageal reflux disease without esophagitis: Secondary | ICD-10-CM

## 2020-04-15 MED ORDER — AZITHROMYCIN 250 MG PO TABS: ORAL_TABLET | ORAL | 0 refills | Status: DC

## 2020-04-15 NOTE — Progress Notes (Signed)
Rose Clements [address] [place], [place] [address]  Internal MEDICINE  Telephone Visit  Patient Name: Rose Clements  [id number]  [id number]  Date of Service: 04/15/2020  I connected with the patient at [phone] by telephone and verified the patients identity using two identifiers.   I discussed the limitations, risks, security and privacy concerns of performing an evaluation and management service by telephone and the availability of in person appointments. I also discussed with the patient that there may be a patient responsible charge related to the service.  The patient expressed understanding and agrees to proceed.    Chief Complaint  Patient presents with  . Telephone Assessment    GOING ON FOR 2 WEEKS  . Telephone Screen  . Sinusitis  . Generalized Body Aches  . Cough    COUGHING UP PHLEGM, DELSYM CALMED COUGH BUT NOT PHLEGM, MUCINEX DOESNT HELP, PHLEGM IS YELLOWIISH (OPAQUE) SOMETIMES AND SOMETIMES CLEAR  . Headache    HPI  Pt seen via telephone.  She reports a few weeks sinus, and allergy symptoms.  About a week ago she started coughing.  She has been coughing up yellow phlegm.  She reports some body aches, but denies fever.  She is on allergy medications and taking Flonase.  She is not sure if she has been wheezing.    Current Medication: Outpatient Encounter Medications as of 04/15/2020  Medication Sig Note  . baclofen (LIORESAL) 20 MG tablet Take 10 mg by mouth as needed.    . cetirizine (ZYRTEC) 10 MG tablet Take 10 mg by mouth daily.   . Cholecalciferol (VITAMIN D3) 1000 UNITS CAPS Take 2 capsules by mouth daily.    . DULoxetine (CYMBALTA) 30 MG capsule Take 3 capsules (90 mg total) by mouth daily. TAKE 3 CAPSULES ONCE DAILY   . fluticasone (FLONASE) 50 MCG/ACT nasal spray Place 2 sprays into both nostrils daily.   . modafinil (PROVIGIL) 200 MG tablet Take 1 tablet (200 mg total) by mouth daily.   . natalizumab (TYSABRI) 300 MG/15ML injection Inject into the  vein. 03/26/2020: JCV ab drawn on 03/07/2020 negative, index: 0.19  . pantoprazole (PROTONIX) 40 MG tablet Take 1 tablet (40 mg total) by mouth daily.   . Polyethylene Glycol POWD Take 17 g by mouth daily. (Patient taking differently: Take 17 g by mouth as needed. )   . solifenacin (VESICARE) 10 MG tablet Take 1 tablet (10 mg total) by mouth daily.   Marland Kitchen topiramate (TOPAMAX) 50 MG tablet Take 50 mg by mouth 2 (two) times daily.    No facility-administered encounter medications on file as of 04/15/2020.    Surgical History: Past Surgical History:  Procedure Laterality Date  . ABDOMINAL HYSTERECTOMY  2003  . baclofen pump insertion Right 01/04/2018  . CARPAL TUNNEL RELEASE Right 1997  . CESAREAN SECTION  1989  . CYSTECTOMY  2002   From chest  . ESOPHAGOGASTRODUODENOSCOPY (EGD) WITH PROPOFOL N/A 05/19/2016   Procedure: ESOPHAGOGASTRODUODENOSCOPY (EGD) WITH PROPOFOL;  Surgeon: Rose Minium, MD;  Location: [facility] ENDOSCOPY;  Service: Endoscopy;  Laterality: N/A;  . KIDNEY DONATION Left 2002  . OVARIAN CYST REMOVAL Left 2002   Ovarian mass removal  . TONSILLECTOMY  1973  . UPPER GASTROINTESTINAL ENDOSCOPY  11/19/2014   Benign appearing esophageal stricture-Dilated.    Medical History: Past Medical History:  Diagnosis Date  . Constipation   . Cystitis bacillary, chronic   . Depression   . Dysphagia    Dx by Rose Clements on 10/15/14  .  Eosinophilic esophagitis 84/13/2440   GE junction benign stricture s/p balloon dilation, retained food contents. ?gastroparesis versus secondary to EE.  Marland Kitchen Esophageal dysphagia   . GERD (gastroesophageal reflux disease)   . Heartburn   . Hypertension   . Multilevel degenerative disc disease   . Multiple sclerosis ([place])   . Multiple sclerosis ([place]) 07/16/2015    Family History: Family History  Problem Relation Age of Onset  . Diabetes Mother   . Hypertension Mother   . Heart disease Mother   . Pulmonary embolism Mother   . Diabetes Father   . Diabetes  Sister   . Hypertension Sister   . Heart disease Sister   . Bipolar disorder Sister   . Narcolepsy Brother   . Heart attack Maternal Grandmother   . Pancreatic cancer Maternal Grandfather   . Breast cancer Maternal Aunt     Social History   Socioeconomic History  . Marital status: Married    Spouse name: Rose Clements  . Number of children: 1  . Years of education: Not on file  . Highest education level: Not on file  Occupational History  . Occupation: Disability  Tobacco Use  . Smoking status: Never Smoker  . Smokeless tobacco: Never Used  Substance and Sexual Activity  . Alcohol use: No    Alcohol/week: 0.0 standard drinks  . Drug use: No  . Sexual activity: Not Currently  Other Topics Concern  . Not on file  Social History Narrative   Lives with husband, Rose Clements   Right handed    Caffeine use: tea sometimes (mostly herbal)   Social Determinants of Health   Financial Resource Strain:   . Difficulty of Paying Living Expenses:   Food Insecurity:   . Worried About Charity fundraiser in the Last Year:   . Arboriculturist in the Last Year:   Transportation Needs:   . Film/video editor (Medical):   Marland Kitchen Lack of Transportation (Non-Medical):   Physical Activity:   . Days of Exercise per Week:   . Minutes of Exercise per Session:   Stress:   . Feeling of Stress :   Social Connections:   . Frequency of Communication with Friends and Family:   . Frequency of Social Gatherings with Friends and Family:   . Attends Religious Services:   . Active Member of Clubs or Organizations:   . Attends Archivist Meetings:   Marland Kitchen Marital Status:   Intimate Partner Violence:   . Fear of Current or Ex-Partner:   . Emotionally Abused:   Marland Kitchen Physically Abused:   . Sexually Abused:       Review of Systems  Constitutional: Positive for chills and fatigue. Negative for unexpected weight change.  HENT: Negative for congestion, rhinorrhea, sneezing and sore throat.   Eyes:  Negative for photophobia, pain and redness.  Respiratory: Positive for cough and chest tightness. Negative for shortness of breath.   Cardiovascular: Negative for chest pain and palpitations.  Gastrointestinal: Negative for abdominal pain, constipation, diarrhea, nausea and vomiting.  Endocrine: Negative.   Genitourinary: Negative for dysuria and frequency.  Musculoskeletal: Negative for arthralgias, back pain, joint swelling and neck pain.  Skin: Negative for rash.  Allergic/Immunologic: Negative.   Neurological: Negative for tremors and numbness.  Hematological: Negative for adenopathy. Does not bruise/bleed easily.  Psychiatric/Behavioral: Negative for behavioral problems and sleep disturbance. The patient is not nervous/anxious.     Vital Signs: BP 128/82   Pulse 84   Temp  98.4 F (36.9 C)   Wt 194 lb (88 kg)   BMI 32.28 kg/m    Observation/Objective:  Well sounding, coughing.  NAD noted   Assessment/Plan: 1. Respiratory infection Advised patient to take entire course of antibiotics as prescribed with food. Pt should return to clinic in 7-10 days if symptoms fail to improve or new symptoms develop.  - azithromycin (ZITHROMAX) 250 MG tablet; Take as directed.  Dispense: 6 tablet; Refill: 0  2. Allergy, sequela Continue allergy medications as prescribed.   3. Gastroesophageal reflux disease without esophagitis Stable, continue current management.   4. Hypertension, unspecified type BP well controlled. Continue to monitor.  General Counseling: Rose Clements verbalizes understanding of the findings of today's phone visit and agrees with plan of treatment. I have discussed any further diagnostic evaluation that may be needed or ordered today. We also reviewed her medications today. she has been encouraged to call the office with any questions or concerns that should arise related to todays visit.    No orders of the defined types were placed in this encounter.   No orders of  the defined types were placed in this encounter.   Time spent: 30 Minutes    Blima Ledger AGNP-C Internal medicine

## 2020-04-19 DIAGNOSIS — R200 Anesthesia of skin: Secondary | ICD-10-CM | POA: Diagnosis not present

## 2020-04-19 DIAGNOSIS — Z9181 History of falling: Secondary | ICD-10-CM | POA: Diagnosis not present

## 2020-04-19 DIAGNOSIS — G35 Multiple sclerosis: Secondary | ICD-10-CM | POA: Diagnosis not present

## 2020-04-19 DIAGNOSIS — Z451 Encounter for adjustment and management of infusion pump: Secondary | ICD-10-CM | POA: Diagnosis not present

## 2020-04-19 DIAGNOSIS — M62830 Muscle spasm of back: Secondary | ICD-10-CM | POA: Diagnosis not present

## 2020-04-19 DIAGNOSIS — Z79899 Other long term (current) drug therapy: Secondary | ICD-10-CM | POA: Diagnosis not present

## 2020-05-09 DIAGNOSIS — G35 Multiple sclerosis: Secondary | ICD-10-CM | POA: Diagnosis not present

## 2020-05-23 ENCOUNTER — Ambulatory Visit: Payer: Medicare Other | Admitting: Neurology

## 2020-05-23 ENCOUNTER — Encounter: Payer: Self-pay | Admitting: Neurology

## 2020-05-23 ENCOUNTER — Telehealth: Payer: Self-pay

## 2020-05-23 VITALS — BP 149/87 | HR 67 | Ht 65.0 in | Wt 196.0 lb

## 2020-05-23 DIAGNOSIS — R29898 Other symptoms and signs involving the musculoskeletal system: Secondary | ICD-10-CM

## 2020-05-23 DIAGNOSIS — R262 Difficulty in walking, not elsewhere classified: Secondary | ICD-10-CM

## 2020-05-23 DIAGNOSIS — Z79899 Other long term (current) drug therapy: Secondary | ICD-10-CM | POA: Insufficient documentation

## 2020-05-23 DIAGNOSIS — G35 Multiple sclerosis: Secondary | ICD-10-CM | POA: Diagnosis not present

## 2020-05-23 DIAGNOSIS — N3941 Urge incontinence: Secondary | ICD-10-CM

## 2020-05-23 MED ORDER — RIZATRIPTAN BENZOATE 10 MG PO TBDP: 10.0000 mg | ORAL_TABLET | ORAL | 11 refills | Status: DC | PRN

## 2020-05-23 NOTE — Progress Notes (Signed)
[facility]  PATIENT: Rose Clements DOB: 10-Nov-1961  REFERRING DOCTOR OR PCP:  Beverely Risen, MD SOURCE: Patient, notes from primary care and neurology, MRI and laboratory reports, MRI images personally reviewed.  _________________________________   HISTORICAL  CHIEF COMPLAINT:  Chief Complaint  Patient presents with  . Follow-up    Rm 12 alone here for 6 month f/u on tysabri last infusion 05/09/2020. she does report a few tripping episodes since last visit but feels like she is doing well.    Update 05/23/2020: She is on Tysabri.  Due to poor access she makes sure to drink a lot before each IV.   She denies any exacerbation since the last visit.  She has a baclofen pump and gets it filled at [facility] (2 years now.   She feels neurologically stable.   She has a rolling walker and needs a new one (has script).   The right leg gives out easily.   She has had a couple falls.    Her bladder is doing about the same.      She has fatigue.  She tried Modafinil and felt it dried her out and did not help much.   She sleeps poorly at night.    She gets headaches some nights.   She takes topiramate but is not sure it hels her headache much.  She has a headache about 1/week.   There are migrainous features with nausea, photophobia/phonopobia.   She takes Tylenol when one occurs.   She has never tried a triptan  She had an U/S thyroid and has two colloid cysts.      Update 11/21/2019: Rose Clements is a 59 year old woman with MS.  She is on Tysabri as her disease modifying therapy and tolerates it well.  She recently transferred her care from [facility] to our site to get infusions closer to home.  Over the past 7 days she has noted more weakness in the right leg.  The left leg is still weaker than the right leg but before last week the right leg she has had minimal weakness.  She has some spasticity bilaterally.  She notes that her walking is less balance and she needs to hold on more.  She notes no  change in bladder (on Vesicare for urgency).  She notes no new numbness.  She notes fatigue but at a similar level as previously.  Modafinil helps with fatigue some.  Mood is doing well.  Initial consultation 08/24/2019:  I had the pleasure of seeing your patient, Rose Clements, at the MS center at [facility] for neurologic consultation regarding her relapsing remitting multiple sclerosis.    She is a 59 year old woman who was diagnosed with relapsing remitting MS in 2011 after she presented with stiffness in her left arm and a clumsy gait with some falls.   In retrospect some of the leg symptoms started a little earlier.    She had MRI's and LP with lesions and CSF findings consistent with MS.    She was placed on Avonex.   She did ok initially but then had an exacerbation with more issues with her legs.   She was started on Tysabri about 2016 after these issues.   She has done well with Tysabri and has not had any exacerbations.   Initially, she was diagnosed and treated in [place].  She moved to [facility]) in 2016 and has seen Dr. Ronalee Red at [facility] since that time.  She has difficulty  with gait due to spasticity and weakness in legs, left worse than right.   Even with a baclofen pump, she has left sided spasticity fairly constantly.   Her pump is managed by Lucy Antigua, PA-C at [facility].   Her dose is 90.76 mcg daily.  Dose has been increased some.   Years ago she tried Ampyra for gait without success.  She reports they are concerned that a higher dose caused her to fall more.  Her right arm is strong and left has mild weakness and numbness and clumsiness.   She has urinary urgency with some incontinence.   She did not have much success with oxybutynin and Myrbetriq in the past.    Vision is ok.  She has fatigue, severe at times.  This is physical > mental.   She does not sleep well.   She snores but does not have OSA.    She reports PSG with Dr. Freda Munro in [place]  showed narcolepsy and cataplexy.  However, she does not described cataplexy symptoms when discussed today.   She has vivid dreams and occasional hypnagogic hallucinations and sleep paralysis.   She is very sleepy during the day.   Modafinil helped some.     She also has migraine headaches , better with topiramate.   She has some depression helped by duloxetine.       She also has had esophageal strictures for dysphagia and has had 2 procedures.   Symptoms worsened after a high dose course of steroids.      I personally reviewed several MRIs: MRI of the cervical spine 03/07/2018 and 05/15/2018 show a couple T2 hyperintense lesions within the spinal cord.  One to the left adjacent to C2-C3 and 1 to the right adjacent to C4.    MRI of the brain 05/15/2018 and 03/07/2018 show multiple T2/flair hyperintense foci.  Somewhat nonspecific but a few are periventricular and juxtacortical.  There is no change between the 2 studies.    MRI of the thoracic spine 05/16/2015 did not show any definite lesions.   Hemangiomas within the T2 and T6 vertebral body.  ESS 08/24/2019 EPWORTH SLEEPINESS SCALE  On a scale of 0 - 3 what is the chance of dozing:  Sitting and Reading:   3 Watching TV:    3 Sitting inactive in a public place: 0 Passenger in car for one hour: 3 Lying down to rest in the afternoon: 3 Sitting and talking to someone: 2 Sitting quietly after lunch:  3 In a car, stopped in traffic:  1  Total (out of 24):  18/24   Moderate sleepiness.    REVIEW OF SYSTEMS: Constitutional: No fevers, chills, sweats, or change in appetite Eyes: No visual changes, double vision, eye pain Ear, nose and throat: No hearing loss, ear pain, nasal congestion, sore throat Cardiovascular: No chest pain, palpitations Respiratory: No shortness of breath at rest or with exertion.   No wheezes GastrointestinaI: No nausea, vomiting, diarrhea, abdominal pain, fecal incontinence Genitourinary: No dysuria, urinary retention or  frequency.  No nocturia. Musculoskeletal: No neck pain, back pain Integumentary: No rash, pruritus, skin lesions Neurological: as above Psychiatric: No depression at this time.  No anxiety Endocrine: No palpitations, diaphoresis, change in appetite, change in weigh or increased thirst Hematologic/Lymphatic: No anemia, purpura, petechiae. Allergic/Immunologic: No itchy/runny eyes, nasal congestion, recent allergic reactions, rashes  ALLERGIES: Allergies  Allergen Reactions  . Lisinopril Hives    HOME MEDICATIONS:  Current Outpatient Medications:  .  azithromycin (ZITHROMAX)  250 MG tablet, Take as directed., Disp: 6 tablet, Rfl: 0 .  baclofen (LIORESAL) 20 MG tablet, Take 10 mg by mouth as needed. , Disp: , Rfl:  .  cetirizine (ZYRTEC) 10 MG tablet, Take 10 mg by mouth daily., Disp: , Rfl:  .  Cholecalciferol (VITAMIN D3) 1000 UNITS CAPS, Take 2 capsules by mouth daily. , Disp: , Rfl:  .  DULoxetine (CYMBALTA) 30 MG capsule, Take 3 capsules (90 mg total) by mouth daily. TAKE 3 CAPSULES ONCE DAILY, Disp: 270 capsule, Rfl: 1 .  fluticasone (FLONASE) 50 MCG/ACT nasal spray, Place 2 sprays into both nostrils daily., Disp: 16 g, Rfl: 2 .  modafinil (PROVIGIL) 200 MG tablet, Take 1 tablet (200 mg total) by mouth daily., Disp: 90 tablet, Rfl: 1 .  natalizumab (TYSABRI) 300 MG/15ML injection, Inject into the vein., Disp: , Rfl:  .  pantoprazole (PROTONIX) 40 MG tablet, Take 1 tablet (40 mg total) by mouth daily., Disp: 90 tablet, Rfl: 1 .  Polyethylene Glycol POWD, Take 17 g by mouth daily. (Patient taking differently: Take 17 g by mouth as needed. ), Disp: 527 g, Rfl: 12 .  solifenacin (VESICARE) 10 MG tablet, Take 1 tablet (10 mg total) by mouth daily., Disp: 90 tablet, Rfl: 4 .  topiramate (TOPAMAX) 50 MG tablet, Take 50 mg by mouth 2 (two) times daily., Disp: , Rfl:  .  rizatriptan (MAXALT-MLT) 10 MG disintegrating tablet, Take 1 tablet (10 mg total) by mouth as needed for migraine. May  repeat in 2 hours if needed, Disp: 9 tablet, Rfl: 11  PAST MEDICAL HISTORY: Past Medical History:  Diagnosis Date  . Constipation   . Cystitis bacillary, chronic   . Depression   . Dysphagia    Dx by Beverely Risen on 10/15/14  . Eosinophilic esophagitis 11/19/2014   GE junction benign stricture s/p balloon dilation, retained food contents. ?gastroparesis versus secondary to EE.  Marland Kitchen Esophageal dysphagia   . GERD (gastroesophageal reflux disease)   . Heartburn   . Hypertension   . Multilevel degenerative disc disease   . Multiple sclerosis ([facility])   . Multiple sclerosis ([facility]) 07/16/2015    PAST SURGICAL HISTORY: Past Surgical History:  Procedure Laterality Date  . ABDOMINAL HYSTERECTOMY  2003  . baclofen pump insertion Right 01/04/2018  . CARPAL TUNNEL RELEASE Right 1997  . CESAREAN SECTION  1989  . CYSTECTOMY  2002   From chest  . ESOPHAGOGASTRODUODENOSCOPY (EGD) WITH PROPOFOL N/A 05/19/2016   Procedure: ESOPHAGOGASTRODUODENOSCOPY (EGD) WITH PROPOFOL;  Surgeon: [name], MD;  Location: [facility] ENDOSCOPY;  Service: Endoscopy;  Laterality: N/A;  . KIDNEY DONATION Left 2002  . OVARIAN CYST REMOVAL Left 2002   Ovarian mass removal  . TONSILLECTOMY  1973  . UPPER GASTROINTESTINAL ENDOSCOPY  11/19/2014   Benign appearing esophageal stricture-Dilated.    FAMILY HISTORY: Family History  Problem Relation Age of Onset  . Diabetes Mother   . Hypertension Mother   . Heart disease Mother   . Pulmonary embolism Mother   . Diabetes Father   . Diabetes Sister   . Hypertension Sister   . Heart disease Sister   . Bipolar disorder Sister   . Narcolepsy Brother   . Heart attack Maternal Grandmother   . Pancreatic cancer Maternal Grandfather   . Breast cancer Maternal Aunt     SOCIAL HISTORY:  Social History   Socioeconomic History  . Marital status: Married    Spouse name: [name]  . Number of children: 1  .  Years of education: Not on file  . Highest education level: Not on file    Occupational History  . Occupation: Disability  Tobacco Use  . Smoking status: Never Smoker  . Smokeless tobacco: Never Used  Vaping Use  . Vaping Use: Never used  Substance and Sexual Activity  . Alcohol use: No    Alcohol/week: 0.0 standard drinks  . Drug use: No  . Sexual activity: Not Currently  Other Topics Concern  . Not on file  Social History Narrative   Lives with husband, Gladyce Mcray   Right handed    Caffeine use: tea sometimes (mostly herbal)   Social Determinants of Health   Financial Resource Strain:   . Difficulty of Paying Living Expenses:   Food Insecurity:   . Worried About Programme researcher, broadcasting/film/video in the Last Year:   . Barista in the Last Year:   Transportation Needs:   . Freight forwarder (Medical):   Marland Kitchen Lack of Transportation (Non-Medical):   Physical Activity:   . Days of Exercise per Week:   . Minutes of Exercise per Session:   Stress:   . Feeling of Stress :   Social Connections:   . Frequency of Communication with Friends and Family:   . Frequency of Social Gatherings with Friends and Family:   . Attends Religious Services:   . Active Member of Clubs or Organizations:   . Attends Banker Meetings:   Marland Kitchen Marital Status:   Intimate Partner Violence:   . Fear of Current or Ex-Partner:   . Emotionally Abused:   Marland Kitchen Physically Abused:   . Sexually Abused:      PHYSICAL EXAM  Vitals:   05/23/20 1520  BP: (!) 149/87  Pulse: 67  Weight: 196 lb (88.9 kg)  Height: 5\' 5"  (1.651 m)    Body mass index is 32.62 kg/m.   General: The patient is well-developed and well-nourished and in no acute distress  Skin: Extremities are without rash or edema.  Musculoskeletal:  Back is nontender  Neurologic Exam  Mental status: The patient is alert and oriented x 3 at the time of the examination. The patient has apparent normal recent and remote memory, with an apparently normal attention span and concentration ability.   Speech  is normal.  Cranial nerves: Extraocular movements are full.  Facial strength was normal.  No obvious hearing deficits are noted.  Motor:  Muscle bulk is normal.   She has increased muscle tone in legs.  Muscle tone is increased, left greater than right leg.  She had reduced rapid altering movements in the left arm and strength was 4/5 in the triceps and 5/5 elsewhere.  Strength was 4 to 4+/5 in the right leg (worse)and 4/5 in the left leg.  Sensory: Sensory testing is intact to pinprick, soft touch and vibration sensation in all 4 extremities.  Coordination: Cerebellar testing reveals good finger-nose-finger and slightly reduced heel-to-shin bilaterally.  Gait and station: Station is normal.   She has a spastic gait and cannot tandem walk..Romberg is negative.     Reflexes: Deep tendon reflexes are increased in the legs, left greater than right and there are crossed abductor reflexes.  Slight asymmetry in the arms.     DIAGNOSTIC DATA (LABS, IMAGING, TESTING) - I reviewed patient records, labs, notes, testing and imaging myself where available.  Lab Results  Component Value Date   WBC 7.5 03/07/2020   HGB 12.0 03/07/2020   HCT 36.3  03/07/2020   MCV 93 03/07/2020   PLT 288 03/07/2020      Component Value Date/Time   NA 143 02/07/2020 1050   K 4.6 02/07/2020 1050   CL 107 (H) 02/07/2020 1050   CO2 20 02/07/2020 1050   GLUCOSE 87 02/07/2020 1050   GLUCOSE 101 (H) 02/15/2019 1500   BUN 9 02/07/2020 1050   CREATININE 0.96 02/07/2020 1050   CALCIUM 9.4 02/07/2020 1050   PROT 6.8 02/07/2020 1050   ALBUMIN 4.4 02/07/2020 1050   AST 14 02/07/2020 1050   ALT 5 02/07/2020 1050   ALKPHOS 88 02/07/2020 1050   BILITOT 0.5 02/07/2020 1050   GFRNONAA 66 02/07/2020 1050   GFRAA 76 02/07/2020 1050   Lab Results  Component Value Date   CHOL 222 (H) 02/07/2020   HDL 66 02/07/2020   LDLCALC 144 (H) 02/07/2020   TRIG 71 02/07/2020   No results found for: HGBA1C No results found for:  VITAMINB12 Lab Results  Component Value Date   TSH 1.350 02/07/2020       ASSESSMENT AND PLAN  Multiple sclerosis ([place]) - Plan: Ambulatory referral to Urology  High risk medication use  Difficulty in walking  Weakness of both lower extremities  Urge incontinence - Plan: Ambulatory referral to Urology  1.   She will continue Tysabri infusions.   Her next one may be a couple weeks late as she will be out of town.    If she continues to have problem with getting access we will need to consider a central line. 2.    Continue topiramate.   Try maxalt prn.   If it is not tolerated or does not help, try Nurtec or Ubrelvy.   3.   Stay active and exercise as tolerated. 4.    She has urinary urge incontinence.   She has not had much benefit from [place] (most recent) or oxybutynin or Myrbetriq in the past.  I will refer to urology.    Return for follow-up as scheduled or call sooner if new or worsening neurologic symptoms.     Dinara Lupu A. Felecia Shelling, MD, [facility] 8/47/8412, 8:20 PM Certified in Neurology, Clinical Neurophysiology, Sleep Medicine and Neuroimaging  [facility] [address] [place], [place] [address] [phone]

## 2020-05-23 NOTE — Telephone Encounter (Signed)
JCV lab has been placed in pick up box @ [facility].

## 2020-05-24 LAB — CBC WITH DIFFERENTIAL/PLATELET
Basophils Absolute: 0.1 10*3/uL (ref 0.0–0.2)
Basos: 1 %
EOS (ABSOLUTE): 0.2 10*3/uL (ref 0.0–0.4)
Eos: 2 %
Hematocrit: 36.7 % (ref 34.0–46.6)
Hemoglobin: 12.2 g/dL (ref 11.1–15.9)
Immature Grans (Abs): 0 10*3/uL (ref 0.0–0.1)
Immature Granulocytes: 0 %
Lymphocytes Absolute: 5.2 10*3/uL — ABNORMAL HIGH (ref 0.7–3.1)
Lymphs: 67 %
MCH: 29.8 pg (ref 26.6–33.0)
MCHC: 33.2 g/dL (ref 31.5–35.7)
MCV: 90 fL (ref 79–97)
Monocytes Absolute: 0.5 10*3/uL (ref 0.1–0.9)
Monocytes: 7 %
NRBC: 1 % — ABNORMAL HIGH (ref 0–0)
Neutrophils Absolute: 1.7 10*3/uL (ref 1.4–7.0)
Neutrophils: 23 %
Platelets: 301 10*3/uL (ref 150–450)
RBC: 4.09 x10E6/uL (ref 3.77–5.28)
RDW: 12.8 % (ref 11.7–15.4)
WBC: 7.7 10*3/uL (ref 3.4–10.8)

## 2020-05-29 NOTE — Telephone Encounter (Signed)
JCV ab drawn on 05/23/2020 negative, index: 0.15

## 2020-05-30 ENCOUNTER — Encounter: Payer: Self-pay | Admitting: Urology

## 2020-06-06 ENCOUNTER — Other Ambulatory Visit: Payer: Self-pay | Admitting: *Deleted

## 2020-06-06 DIAGNOSIS — G35 Multiple sclerosis: Secondary | ICD-10-CM | POA: Diagnosis not present

## 2020-06-06 DIAGNOSIS — Z79899 Other long term (current) drug therapy: Secondary | ICD-10-CM

## 2020-06-06 DIAGNOSIS — I878 Other specified disorders of veins: Secondary | ICD-10-CM

## 2020-06-17 ENCOUNTER — Telehealth: Payer: Self-pay | Admitting: *Deleted

## 2020-06-17 NOTE — Telephone Encounter (Signed)
Faxed completed/signed Tysabri pt status report and reauth questionnaire to MS touch at [phone]. Received confirmation.   Received fax from touch prescribing program that pt re-auth for Tysabri from 06/17/20-01/17/21. Pt enrollment number: [id number]. Account: [facility]. Site auth number: [id number]

## 2020-06-18 ENCOUNTER — Other Ambulatory Visit (HOSPITAL_COMMUNITY): Payer: Self-pay | Admitting: Neurology

## 2020-06-18 ENCOUNTER — Telehealth (HOSPITAL_COMMUNITY): Payer: Self-pay | Admitting: Radiology

## 2020-06-18 DIAGNOSIS — I878 Other specified disorders of veins: Secondary | ICD-10-CM

## 2020-06-18 DIAGNOSIS — G35 Multiple sclerosis: Secondary | ICD-10-CM

## 2020-06-18 NOTE — Telephone Encounter (Signed)
Called pt to schedule port placement, left VM for her to call back to schedule. JM

## 2020-06-20 ENCOUNTER — Other Ambulatory Visit: Payer: Self-pay | Admitting: Radiology

## 2020-06-24 ENCOUNTER — Ambulatory Visit (HOSPITAL_COMMUNITY)
Admission: RE | Admit: 2020-06-24 | Discharge: 2020-06-24 | Disposition: A | Discharge status: Home / Self Care | Payer: Medicare Other | Source: Ambulatory Visit | Attending: Neurology | Admitting: Neurology

## 2020-06-24 ENCOUNTER — Encounter (HOSPITAL_COMMUNITY): Payer: Self-pay

## 2020-06-24 ENCOUNTER — Other Ambulatory Visit: Payer: Self-pay

## 2020-06-24 DIAGNOSIS — K219 Gastro-esophageal reflux disease without esophagitis: Secondary | ICD-10-CM | POA: Diagnosis not present

## 2020-06-24 DIAGNOSIS — I10 Essential (primary) hypertension: Secondary | ICD-10-CM | POA: Diagnosis not present

## 2020-06-24 DIAGNOSIS — G35 Multiple sclerosis: Secondary | ICD-10-CM | POA: Diagnosis not present

## 2020-06-24 DIAGNOSIS — Z79899 Other long term (current) drug therapy: Secondary | ICD-10-CM | POA: Diagnosis not present

## 2020-06-24 DIAGNOSIS — R1310 Dysphagia, unspecified: Secondary | ICD-10-CM | POA: Diagnosis not present

## 2020-06-24 DIAGNOSIS — Z452 Encounter for adjustment and management of vascular access device: Secondary | ICD-10-CM | POA: Diagnosis not present

## 2020-06-24 DIAGNOSIS — F329 Major depressive disorder, single episode, unspecified: Secondary | ICD-10-CM | POA: Diagnosis not present

## 2020-06-24 DIAGNOSIS — I878 Other specified disorders of veins: Secondary | ICD-10-CM

## 2020-06-24 DIAGNOSIS — Z8669 Personal history of other diseases of the nervous system and sense organs: Secondary | ICD-10-CM | POA: Diagnosis not present

## 2020-06-24 HISTORY — PX: IR IMAGING GUIDED PORT INSERTION: IMG5740

## 2020-06-24 MED ORDER — SODIUM CHLORIDE 0.9 % IV SOLN: INTRAVENOUS | Status: DC

## 2020-06-24 MED ORDER — LIDOCAINE-EPINEPHRINE 1 %-1:100000 IJ SOLN
INTRAMUSCULAR | Status: AC
  Filled 2020-06-24: qty 1

## 2020-06-24 MED ORDER — MIDAZOLAM HCL 2 MG/2ML IJ SOLN
INTRAMUSCULAR | Status: AC
  Filled 2020-06-24: qty 2

## 2020-06-24 MED ORDER — FENTANYL CITRATE (PF) 100 MCG/2ML IJ SOLN
INTRAMUSCULAR | Status: AC | PRN
  Administered 2020-06-24: 50 ug via INTRAVENOUS

## 2020-06-24 MED ORDER — HEPARIN SOD (PORK) LOCK FLUSH 100 UNIT/ML IV SOLN
INTRAVENOUS | Status: AC
  Filled 2020-06-24: qty 5

## 2020-06-24 MED ORDER — MIDAZOLAM HCL 2 MG/2ML IJ SOLN
INTRAMUSCULAR | Status: AC | PRN
  Administered 2020-06-24: 1 mg via INTRAVENOUS

## 2020-06-24 MED ORDER — CEFAZOLIN SODIUM-DEXTROSE 2-4 GM/100ML-% IV SOLN: 2.0000 g | INTRAVENOUS | Status: AC

## 2020-06-24 MED ORDER — CEFAZOLIN SODIUM-DEXTROSE 2-4 GM/100ML-% IV SOLN
INTRAVENOUS | Status: AC
  Administered 2020-06-24: 2 g via INTRAVENOUS
  Filled 2020-06-24: qty 100

## 2020-06-24 MED ORDER — FENTANYL CITRATE (PF) 100 MCG/2ML IJ SOLN
INTRAMUSCULAR | Status: AC
  Filled 2020-06-24: qty 2

## 2020-06-24 NOTE — Discharge Instructions (Signed)
Implanted Port Insertion, Care After °This sheet gives you information about how to care for yourself after your procedure. Your health care provider may also give you more specific instructions. If you have problems or questions, contact your health care provider. °What can I expect after the procedure? °After the procedure, it is common to have: °· Discomfort at the port insertion site. °· Bruising on the skin over the port. This should improve over 3-4 days. °Follow these instructions at home: °Port care °· After your port is placed, you will get a manufacturer's information card. The card has information about your port. Keep this card with you at all times. °· Take care of the port as told by your health care provider. Ask your health care provider if you or a family member can get training for taking care of the port at home. A home health care nurse may also take care of the port. °· Make sure to remember what type of port you have. °Incision care ° °  ° °· Follow instructions from your health care provider about how to take care of your port insertion site. Make sure you: °? Wash your hands with soap and water before and after you change your bandage (dressing). If soap and water are not available, use hand sanitizer. °? Change your dressing as told by your health care provider. °? Leave stitches (sutures), skin glue, or adhesive strips in place. These skin closures may need to stay in place for 2 weeks or longer. If adhesive strip edges start to loosen and curl up, you may trim the loose edges. Do not remove adhesive strips completely unless your health care provider tells you to do that. °· Check your port insertion site every day for signs of infection. Check for: °? Redness, swelling, or pain. °? Fluid or blood. °? Warmth. °? Pus or a bad smell. °Activity °· Return to your normal activities as told by your health care provider. Ask your health care provider what activities are safe for you. °· Do not  lift anything that is heavier than 10 lb (4.5 kg), or the limit that you are told, until your health care provider says that it is safe. °General instructions °· Take over-the-counter and prescription medicines only as told by your health care provider. °· Do not take baths, swim, or use a hot tub until your health care provider approves. Ask your health care provider if you may take showers. You may only be allowed to take sponge baths. °· Do not drive for 24 hours if you were given a sedative during your procedure. °· Wear a medical alert bracelet in case of an emergency. This will tell any health care providers that you have a port. °· Keep all follow-up visits as told by your health care provider. This is important. °Contact a health care provider if: °· You cannot flush your port with saline as directed, or you cannot draw blood from the port. °· You have a fever or chills. °· You have redness, swelling, or pain around your port insertion site. °· You have fluid or blood coming from your port insertion site. °· Your port insertion site feels warm to the touch. °· You have pus or a bad smell coming from the port insertion site. °Get help right away if: °· You have chest pain or shortness of breath. °· You have bleeding from your port that you cannot control. °Summary °· Take care of the port as told by your health   care provider. Keep the manufacturer's information card with you at all times. °· Change your dressing as told by your health care provider. °· Contact a health care provider if you have a fever or chills or if you have redness, swelling, or pain around your port insertion site. °· Keep all follow-up visits as told by your health care provider. °This information is not intended to replace advice given to you by your health care provider. Make sure you discuss any questions you have with your health care provider. °Document Revised: 06/21/2018 Document Reviewed: 06/21/2018 °Elsevier Patient Education ©  2020 [employer] ° °

## 2020-06-24 NOTE — H&P (Signed)
Chief Complaint: Patient was seen in consultation today for poor venous access  Referring Physician(s): Sater,Richard A  Supervising Physician: Ruel Favors  Patient Status: [facility] - Out-pt  History of Present Illness: Rose Clements is a 59 y.o. female with relapsing remitting multiple sclerosis diagnosed in 2011 s/p baclofen pump placement in 2019 who undergoes frequent infusions and lab draws with difficult at her infusion center.  IR consulted for Port-A-Cath placement by Dr. Epimenio Foot.   Case reviewed and approved by Dr. Miles Costain.   [name] assessed in short stay this AM.  She is aware of the risks and benefits of tunneled line.  Discussed difference between her current baclofen pump and a Port. She is agreeable to proceed.   Past Medical History:  Diagnosis Date  . Constipation   . Cystitis bacillary, chronic   . Depression   . Dysphagia    Dx by Beverely Risen on 10/15/14  . Eosinophilic esophagitis 11/19/2014   GE junction benign stricture s/p balloon dilation, retained food contents. ?gastroparesis versus secondary to EE.  Marland Kitchen Esophageal dysphagia   . GERD (gastroesophageal reflux disease)   . Heartburn   . Hypertension   . Multilevel degenerative disc disease   . Multiple sclerosis (HCC)   . Multiple sclerosis (HCC) 07/16/2015    Past Surgical History:  Procedure Laterality Date  . ABDOMINAL HYSTERECTOMY  2003  . baclofen pump insertion Right 01/04/2018  . CARPAL TUNNEL RELEASE Right 1997  . CESAREAN SECTION  1989  . CYSTECTOMY  2002   From chest  . ESOPHAGOGASTRODUODENOSCOPY (EGD) WITH PROPOFOL N/A 05/19/2016   Procedure: ESOPHAGOGASTRODUODENOSCOPY (EGD) WITH PROPOFOL;  Surgeon: [name], MD;  Location: [facility] ENDOSCOPY;  Service: Endoscopy;  Laterality: N/A;  . KIDNEY DONATION Left 2002  . OVARIAN CYST REMOVAL Left 2002   Ovarian mass removal  . TONSILLECTOMY  1973  . UPPER GASTROINTESTINAL ENDOSCOPY  11/19/2014   Benign appearing esophageal stricture-Dilated.     Allergies: Lisinopril  Medications: Prior to Admission medications   Medication Sig Start Date End Date Taking? Authorizing Provider  acetaminophen (TYLENOL) 500 MG tablet Take 500 mg by mouth every 6 (six) hours as needed (for pain.).   Yes [provider]  baclofen (LIORESAL) 10 MG tablet Take 10 mg by mouth See admin instructions. Take 1 tablet (10 mg) by mouth scheduled in the morning & evening, may take an additional dose at bedtime if needed for spasms. 04/15/20  Yes [provider]  cetirizine (ZYRTEC) 10 MG tablet Take 10 mg by mouth daily.   Yes [provider]  Cholecalciferol (VITAMIN D3) 1000 UNITS CAPS Take 1,000 Units by mouth in the morning and at bedtime.    Yes [provider]  DULoxetine (CYMBALTA) 30 MG capsule Take 3 capsules (90 mg total) by mouth daily. TAKE 3 CAPSULES ONCE DAILY 02/01/20  Yes Scarboro, Coralee North, NP  fluticasone (FLONASE) 50 MCG/ACT nasal spray Place 2 sprays into both nostrils daily. 09/04/19  Yes Boscia, Kathlynn Grate, NP  natalizumab (TYSABRI) 300 MG/15ML injection Inject 300 mg into the vein every 30 (thirty) days.    Yes [provider]  pantoprazole (PROTONIX) 40 MG tablet Take 1 tablet (40 mg total) by mouth daily. 02/01/20  Yes Johnna Acosta, NP  Polyethylene Glycol POWD Take 17 g by mouth daily. Patient taking differently: Take 17 g by mouth daily as needed (constipation.).  03/03/18  Yes Lyndon Code, MD  topiramate (TOPAMAX) 50 MG tablet Take 50 mg by mouth 2 (two)  times daily. 03/20/19  Yes [provider]  azithromycin (ZITHROMAX) 250 MG tablet Take as directed. Patient not taking: Reported on 06/20/2020 04/15/20   Johnna Acosta, NP  modafinil (PROVIGIL) 200 MG tablet Take 1 tablet (200 mg total) by mouth daily. Patient not taking: Reported on 06/20/2020 11/20/19   Asa Lente, MD  rizatriptan (MAXALT-MLT) 10 MG disintegrating tablet Take 1 tablet (10 mg total) by mouth as needed for  migraine. May repeat in 2 hours if needed 05/23/20   Sater, Pearletha Furl, MD  solifenacin (VESICARE) 10 MG tablet Take 1 tablet (10 mg total) by mouth daily. Patient not taking: Reported on 06/20/2020 11/20/19   Asa Lente, MD     Family History  Problem Relation Age of Onset  . Diabetes Mother   . Hypertension Mother   . Heart disease Mother   . Pulmonary embolism Mother   . Diabetes Father   . Diabetes Sister   . Hypertension Sister   . Heart disease Sister   . Bipolar disorder Sister   . Narcolepsy Brother   . Heart attack Maternal Grandmother   . Pancreatic cancer Maternal Grandfather   . Breast cancer Maternal Aunt     Social History   Socioeconomic History  . Marital status: Married    Spouse name: [name]  . Number of children: 1  . Years of education: Not on file  . Highest education level: Not on file  Occupational History  . Occupation: Disability  Tobacco Use  . Smoking status: Never Smoker  . Smokeless tobacco: Never Used  Vaping Use  . Vaping Use: Never used  Substance and Sexual Activity  . Alcohol use: No    Alcohol/week: 0.0 standard drinks  . Drug use: No  . Sexual activity: Not Currently  Other Topics Concern  . Not on file  Social History Narrative   Lives with husband, Laquitha Heslin   Right handed    Caffeine use: tea sometimes (mostly herbal)   Social Determinants of Health   Financial Resource Strain:   . Difficulty of Paying Living Expenses:   Food Insecurity:   . Worried About Programme researcher, broadcasting/film/video in the Last Year:   . Barista in the Last Year:   Transportation Needs:   . Freight forwarder (Medical):   Marland Kitchen Lack of Transportation (Non-Medical):   Physical Activity:   . Days of Exercise per Week:   . Minutes of Exercise per Session:   Stress:   . Feeling of Stress :   Social Connections:   . Frequency of Communication with Friends and Family:   . Frequency of Social Gatherings with Friends and Family:   . Attends  Religious Services:   . Active Member of Clubs or Organizations:   . Attends Banker Meetings:   Marland Kitchen Marital Status:      Review of Systems: A 12 point ROS discussed and pertinent positives are indicated in the HPI above.  All other systems are negative.  Review of Systems  Constitutional: Negative for fatigue and fever.  Respiratory: Negative for cough and shortness of breath.   Cardiovascular: Negative for chest pain.  Gastrointestinal: Negative for abdominal pain.  Musculoskeletal: Negative for back pain.  Psychiatric/Behavioral: Negative for behavioral problems and confusion.    Vital Signs: BP 130/85   Pulse 64   Temp 97.9 F (36.6 C) (Oral)   Resp 14   Ht 5\' 5"  (1.651 m)   Wt 195 lb (  88.5 kg)   SpO2 98%   BMI 32.45 kg/m   Physical Exam Vitals and nursing note reviewed.  Constitutional:      Appearance: Normal appearance.  HENT:     Mouth/Throat:     Mouth: Mucous membranes are moist.     Pharynx: Oropharynx is clear.  Cardiovascular:     Rate and Rhythm: Normal rate and regular rhythm.  Pulmonary:     Effort: Pulmonary effort is normal. No respiratory distress.     Breath sounds: Normal breath sounds.  Abdominal:     General: Abdomen is flat.     Palpations: Abdomen is soft.  Skin:    General: Skin is warm and dry.  Neurological:     General: No focal deficit present.     Mental Status: She is alert and oriented to person, place, and time. Mental status is at baseline.  Psychiatric:        Mood and Affect: Mood normal.        Behavior: Behavior normal.        Thought Content: Thought content normal.        Judgment: Judgment normal.      MD Evaluation Airway: WNL Heart: WNL Abdomen: WNL Chest/ Lungs: WNL ASA  Classification: 3 Mallampati/Airway Score: Two   Imaging: No results found.  Labs:  CBC: Recent Labs    08/24/19 1011 02/07/20 1050 03/07/20 1358 05/23/20 1603  WBC 5.7 4.4 7.5 7.7  HGB 12.4 12.0 12.0 12.2  HCT  39.8 36.6 36.3 36.7  PLT 309 273 288 301    COAGS: No results for input(s): INR, APTT in the last 8760 hours.  BMP: Recent Labs    02/07/20 1050  NA 143  K 4.6  CL 107*  CO2 20  GLUCOSE 87  BUN 9  CALCIUM 9.4  CREATININE 0.96  GFRNONAA 66  GFRAA 76    LIVER FUNCTION TESTS: Recent Labs    02/07/20 1050  BILITOT 0.5  AST 14  ALT 5  ALKPHOS 88  PROT 6.8  ALBUMIN 4.4    TUMOR MARKERS: No results for input(s): AFPTM, CEA, CA199, CHROMGRNA in the last 8760 hours.  Assessment and Plan: Patient with past medical history of multiple sclerosis with frequent lab draws presents with complaint of poor venous access.  IR consulted for Port-A-Cath placement at the request of Dr. Epimenio Foot. Case reviewed by Dr. Miles Costain who approves patient for procedure.  Patient presents today in their usual state of health.  She has been NPO and is not currently on blood thinners.    Risks and benefits of image guided port-a-catheter placement was discussed with the patient including, but not limited to bleeding, infection, pneumothorax, or fibrin sheath development and need for additional procedures.  All of the patient's questions were answered, patient is agreeable to proceed. Consent signed and in chart.  Thank you for this interesting consult.  I greatly enjoyed meeting Fontella Shan and look forward to participating in their care.  A copy of this report was sent to the requesting provider on this date.  Electronically Signed: Hoyt Koch, PA 06/24/2020, 9:40 AM   I spent a total of  30 Minutes   in face to face in clinical consultation, greater than 50% of which was counseling/coordinating care for multiple sclerosis, poor venous access.

## 2020-06-24 NOTE — Procedures (Signed)
Interventional Radiology Procedure Note  Procedure: [place] RT IJ PORT INSERTION  Complications: None  Estimated Blood Loss: MIN  Findings: TIP SVCRA

## 2020-06-26 ENCOUNTER — Telehealth: Payer: Self-pay | Admitting: Neurology

## 2020-06-26 MED ORDER — TOPIRAMATE 50 MG PO TABS: 50.0000 mg | ORAL_TABLET | Freq: Two times a day (BID) | ORAL | 1 refills | Status: DC

## 2020-06-26 NOTE — Telephone Encounter (Signed)
Called pt back. Advised she should contact office that placed catheter to address this question. She verbalized understanding.  She also wants to know if Dr. Epimenio Foot will refill her topamax 50mg  tablet that she takes for migraines. Previous prescriber was Dr. at [facility]. Confirmed she is still taking 50mg  po BID. Advised I will send MD request. If ok, we will send in. If not, I will call her back.

## 2020-06-26 NOTE — Telephone Encounter (Signed)
Pt called wanting to inform the provider that she has had her catheter for her infusions placed and she is wanting to know how long she has to keep the cover on for her to take a shower. Please advise.

## 2020-06-26 NOTE — Telephone Encounter (Signed)
Yes we can go ahead and take this over.

## 2020-06-26 NOTE — Telephone Encounter (Signed)
E-scribed rx to pharmacy.  

## 2020-07-04 DIAGNOSIS — G35 Multiple sclerosis: Secondary | ICD-10-CM | POA: Diagnosis not present

## 2020-07-08 ENCOUNTER — Ambulatory Visit (INDEPENDENT_AMBULATORY_CARE_PROVIDER_SITE_OTHER): Payer: Medicare Other | Admitting: Urology

## 2020-07-08 ENCOUNTER — Encounter: Payer: Self-pay | Admitting: Urology

## 2020-07-08 ENCOUNTER — Other Ambulatory Visit: Payer: Self-pay

## 2020-07-08 VITALS — BP 152/80 | HR 68

## 2020-07-08 DIAGNOSIS — N3941 Urge incontinence: Secondary | ICD-10-CM

## 2020-07-08 DIAGNOSIS — N3946 Mixed incontinence: Secondary | ICD-10-CM | POA: Diagnosis not present

## 2020-07-08 LAB — URINALYSIS, COMPLETE
Bilirubin, UA: NEGATIVE
Glucose, UA: NEGATIVE
Ketones, UA: NEGATIVE
Leukocytes,UA: NEGATIVE
Nitrite, UA: NEGATIVE
Protein,UA: NEGATIVE
RBC, UA: NEGATIVE
Specific Gravity, UA: 1.02 (ref 1.005–1.030)
Urobilinogen, Ur: 0.2 mg/dL (ref 0.2–1.0)
pH, UA: 7 (ref 5.0–7.5)

## 2020-07-08 LAB — MICROSCOPIC EXAMINATION: RBC, Urine: NONE SEEN /hpf (ref 0–2)

## 2020-07-08 NOTE — Patient Instructions (Signed)
Will follow up after Urodynamics appointment

## 2020-07-08 NOTE — Progress Notes (Signed)
07/08/2020 11:12 AM   Rose Clements [birth year] [id number]  Referring provider: Lyndon Code, MD [address] [place],  [place] [address]  Chief Complaint  Patient presents with  . Urinary Incontinence    HPI: I was consulted to assess the patient is urinary incontinence.  She primarily has urge incontinence.  Sometimes she leaks with coughing sneezing.  She has infrequent bedwetting.  She can leak without awareness.  She has had multiple sclerosis for 10 years and is failed oxybutynin and Detrol and Vesicare.  He wears 4-5 pads a day moderately wet or soaked.  She voids with a good flow.  She can double void a large amount.  She voids every 2 hours gets up 3 times at night.  She has had a hysterectomy  Denies history of kidney stones or bladder surgery.   PMH: Past Medical History:  Diagnosis Date  . Constipation   . Cystitis bacillary, chronic   . Depression   . Dysphagia    Dx by Beverely Risen on 10/15/14  . Eosinophilic esophagitis 11/19/2014   GE junction benign stricture s/p balloon dilation, retained food contents. ?gastroparesis versus secondary to EE.  Marland Kitchen Esophageal dysphagia   . GERD (gastroesophageal reflux disease)   . Heartburn   . Hypertension   . Multilevel degenerative disc disease   . Multiple sclerosis (HCC)   . Multiple sclerosis (HCC) 07/16/2015    Surgical History: Past Surgical History:  Procedure Laterality Date  . ABDOMINAL HYSTERECTOMY  2003  . baclofen pump insertion Right 01/04/2018  . CARPAL TUNNEL RELEASE Right 1997  . CESAREAN SECTION  1989  . CYSTECTOMY  2002   From chest  . ESOPHAGOGASTRODUODENOSCOPY (EGD) WITH PROPOFOL N/A 05/19/2016   Procedure: ESOPHAGOGASTRODUODENOSCOPY (EGD) WITH PROPOFOL;  Surgeon: [name], MD;  Location: [facility] ENDOSCOPY;  Service: Endoscopy;  Laterality: N/A;  . IR IMAGING GUIDED PORT INSERTION  06/24/2020  . KIDNEY DONATION Left 2002  . OVARIAN CYST REMOVAL Left 2002   Ovarian mass removal  . TONSILLECTOMY   1973  . UPPER GASTROINTESTINAL ENDOSCOPY  11/19/2014   Benign appearing esophageal stricture-Dilated.    Home Medications:  Allergies as of 07/08/2020      Reactions   Lisinopril Hives      Medication List       Accurate as of July 08, 2020 11:12 AM. If you have any questions, ask your nurse or doctor.        acetaminophen 500 MG tablet Commonly known as: TYLENOL Take 500 mg by mouth every 6 (six) hours as needed (for pain.).   baclofen 10 MG tablet Commonly known as: LIORESAL Take 10 mg by mouth See admin instructions. Take 1 tablet (10 mg) by mouth scheduled in the morning & evening, may take an additional dose at bedtime if needed for spasms.   cetirizine 10 MG tablet Commonly known as: ZYRTEC Take 10 mg by mouth daily.   DULoxetine 30 MG capsule Commonly known as: CYMBALTA Take 3 capsules (90 mg total) by mouth daily. TAKE 3 CAPSULES ONCE DAILY   fluticasone 50 MCG/ACT nasal spray Commonly known as: FLONASE Place 2 sprays into both nostrils daily.   pantoprazole 40 MG tablet Commonly known as: PROTONIX Take 1 tablet (40 mg total) by mouth daily.   Polyethylene Glycol Powd Take 17 g by mouth daily. What changed:   when to take this  reasons to take this   rizatriptan 10 MG disintegrating tablet Commonly known as: MAXALT-MLT Take 1 tablet (10 mg  total) by mouth as needed for migraine. May repeat in 2 hours if needed   topiramate 50 MG tablet Commonly known as: TOPAMAX Take 1 tablet (50 mg total) by mouth 2 (two) times daily.   Tysabri 300 MG/15ML injection Generic drug: natalizumab Inject 300 mg into the vein every 30 (thirty) days.   Vitamin D3 25 MCG (1000 UT) Caps Take 1,000 Units by mouth in the morning and at bedtime.       Allergies:  Allergies  Allergen Reactions  . Lisinopril Hives    Family History: Family History  Problem Relation Age of Onset  . Diabetes Mother   . Hypertension Mother   . Heart disease Mother   . Pulmonary  embolism Mother   . Diabetes Father   . Diabetes Sister   . Hypertension Sister   . Heart disease Sister   . Bipolar disorder Sister   . Narcolepsy Brother   . Heart attack Maternal Grandmother   . Pancreatic cancer Maternal Grandfather   . Breast cancer Maternal Aunt     Social History:  reports that she has never smoked. She has never used smokeless tobacco. She reports that she does not drink alcohol and does not use drugs.  ROS:                                        Physical Exam: There were no vitals taken for this visit.  Constitutional:  Alert and oriented, No acute distress. HEENT: [place] AT, moist mucus membranes.  Trachea midline, no masses. Cardiovascular: No clubbing, cyanosis, or edema. Respiratory: Normal respiratory effort, no increased work of breathing. GI: Abdomen is soft, nontender, nondistended, no abdominal masses GU: No CVA tenderness.  Mild grade 2 hypermobility the bladder neck and no stress incontinence or prolapse Skin: No rashes, bruises or suspicious lesions. Lymph: No cervical or inguinal adenopathy. Neurologic: Grossly intact, no focal deficits, moving all 4 extremities. Psychiatric: Normal mood and affect.  Laboratory Data: Lab Results  Component Value Date   WBC 7.7 05/23/2020   HGB 12.2 05/23/2020   HCT 36.7 05/23/2020   MCV 90 05/23/2020   PLT 301 05/23/2020    Lab Results  Component Value Date   CREATININE 0.96 02/07/2020    No results found for: PSA  No results found for: TESTOSTERONE  No results found for: HGBA1C  Urinalysis    Component Value Date/Time   APPEARANCEUR Cloudy (A) 02/01/2020 1349   GLUCOSEU Negative 02/01/2020 1349   BILIRUBINUR Negative 02/01/2020 1349   PROTEINUR Negative 02/01/2020 1349   NITRITE Negative 02/01/2020 1349   LEUKOCYTESUR 1+ (A) 02/01/2020 1349    Pertinent Imaging: Post void residual 0 mL   Assessment & Plan: Patient has mixed incontinence.  She has multiple  sclerosis.  She double voids a large amount.  She is failed 3 medications.  She has frequency and nocturia.  She can leak without awareness.  She has mild bedwetting.  She is emptying well and will return for urodynamics.  Call if urine culture is positive  1. Urge incontinence  - Urinalysis, Complete   No follow-ups on file.  Martina Sinner, MD  [facility] [address] [place], [place] [address] [phone]

## 2020-07-11 LAB — CULTURE, URINE COMPREHENSIVE

## 2020-07-15 DIAGNOSIS — G35 Multiple sclerosis: Secondary | ICD-10-CM | POA: Diagnosis not present

## 2020-07-25 ENCOUNTER — Other Ambulatory Visit: Payer: Self-pay | Admitting: Urology

## 2020-08-05 DIAGNOSIS — G35 Multiple sclerosis: Secondary | ICD-10-CM | POA: Diagnosis not present

## 2020-08-19 ENCOUNTER — Ambulatory Visit: Payer: Self-pay | Admitting: Urology

## 2020-08-29 ENCOUNTER — Ambulatory Visit: Payer: Medicare Other | Admitting: Hospice and Palliative Medicine

## 2020-09-16 DIAGNOSIS — G35 Multiple sclerosis: Secondary | ICD-10-CM | POA: Diagnosis not present

## 2020-09-19 ENCOUNTER — Ambulatory Visit (INDEPENDENT_AMBULATORY_CARE_PROVIDER_SITE_OTHER): Payer: Medicare Other | Admitting: Hospice and Palliative Medicine

## 2020-09-19 ENCOUNTER — Encounter: Payer: Self-pay | Admitting: Hospice and Palliative Medicine

## 2020-09-19 ENCOUNTER — Other Ambulatory Visit: Payer: Self-pay

## 2020-09-19 DIAGNOSIS — F331 Major depressive disorder, recurrent, moderate: Secondary | ICD-10-CM

## 2020-09-19 DIAGNOSIS — G35 Multiple sclerosis: Secondary | ICD-10-CM

## 2020-09-19 DIAGNOSIS — E042 Nontoxic multinodular goiter: Secondary | ICD-10-CM

## 2020-09-19 DIAGNOSIS — T7840XS Allergy, unspecified, sequela: Secondary | ICD-10-CM

## 2020-09-19 DIAGNOSIS — Z1211 Encounter for screening for malignant neoplasm of colon: Secondary | ICD-10-CM

## 2020-09-19 DIAGNOSIS — Z23 Encounter for immunization: Secondary | ICD-10-CM

## 2020-09-19 MED ORDER — FLUTICASONE PROPIONATE 50 MCG/ACT NA SUSP: 2.0000 | Freq: Every day | NASAL | 2 refills | Status: DC

## 2020-09-19 MED ORDER — DULOXETINE HCL 30 MG PO CPEP: 90.0000 mg | ORAL_CAPSULE | Freq: Every day | ORAL | 1 refills | Status: DC

## 2020-09-19 NOTE — Progress Notes (Signed)
[facility] [address] [place], [place] [address]  Internal MEDICINE  Office Visit Note  Patient Name: Rose Clements  [id number]  [id number]  Date of Service: 09/20/2020  Chief Complaint  Patient presents with  . Follow-up    6 month  . controlled substance policy    acknowledged  . Quality Metric Gaps    HIV screen, Tdap, Flu vacc, colonoscopy, Hep C screen  . Nausea    last few days  . Diarrhea    last few days    HPI Patient is here for routine follow-up She has recently returned from vacation in [place], visiting her daughter Since coming back home she has had some nausea and diarrhea--she frequently has bouts of this especially after travelling, symptoms are improving She is due for routine screening colonoscopy Continues to receive infusions of Tysabri through right chest port for MS--remains stable at this time, no complications from infusions, followed closely by neurology  She had a [place] of her thyroid in March of this year which revealed 2 colloid cysts right lobe and 1 CM left lobe nodule--recommended follow-up in 6-12 months, has had no complications with her thyroid  Allergy symptoms have been well controlled, migraines well controlled, depression and anxiety well controlled--requesting refills    Current Medication: Outpatient Encounter Medications as of 09/19/2020  Medication Sig Note  . acetaminophen (TYLENOL) 500 MG tablet Take 500 mg by mouth every 6 (six) hours as needed (for pain.).   Marland Kitchen baclofen (LIORESAL) 10 MG tablet Take 10 mg by mouth See admin instructions. Take 1 tablet (10 mg) by mouth scheduled in the morning & evening, may take an additional dose at bedtime if needed for spasms.   . cetirizine (ZYRTEC) 10 MG tablet Take 10 mg by mouth daily.   . Cholecalciferol (VITAMIN D3) 1000 UNITS CAPS Take 1,000 Units by mouth in the morning and at bedtime.    . DULoxetine (CYMBALTA) 30 MG capsule Take 3 capsules (90 mg total) by mouth daily.  TAKE 3 CAPSULES ONCE DAILY   . fluticasone (FLONASE) 50 MCG/ACT nasal spray Place 2 sprays into both nostrils daily.   . natalizumab (TYSABRI) 300 MG/15ML injection Inject 300 mg into the vein every 30 (thirty) days.  06/17/2020: Received fax from touch prescribing program that pt re-auth for Tysabri from 06/17/20-01/17/21. Pt enrollment number: [id number]. Account: [facility]. Site auth number: [id number]  . Polyethylene Glycol POWD Take 17 g by mouth daily. (Patient taking differently: Take 17 g by mouth daily as needed (constipation.). )   . rizatriptan (MAXALT-MLT) 10 MG disintegrating tablet Take 1 tablet (10 mg total) by mouth as needed for migraine. May repeat in 2 hours if needed 06/20/2020: New medication patient has not picked up from pharmacy  . topiramate (TOPAMAX) 50 MG tablet Take 1 tablet (50 mg total) by mouth 2 (two) times daily.   . [DISCONTINUED] DULoxetine (CYMBALTA) 30 MG capsule Take 3 capsules (90 mg total) by mouth daily. TAKE 3 CAPSULES ONCE DAILY   . [DISCONTINUED] fluticasone (FLONASE) 50 MCG/ACT nasal spray Place 2 sprays into both nostrils daily.   . [DISCONTINUED] pantoprazole (PROTONIX) 40 MG tablet Take 1 tablet (40 mg total) by mouth daily.    No facility-administered encounter medications on file as of 09/19/2020.    Surgical History: Past Surgical History:  Procedure Laterality Date  . ABDOMINAL HYSTERECTOMY  2003  . baclofen pump insertion Right 01/04/2018  . CARPAL TUNNEL RELEASE Right 1997  . CESAREAN SECTION  1989  .  CYSTECTOMY  2002   From chest  . ESOPHAGOGASTRODUODENOSCOPY (EGD) WITH PROPOFOL N/A 05/19/2016   Procedure: ESOPHAGOGASTRODUODENOSCOPY (EGD) WITH PROPOFOL;  Surgeon: [name], MD;  Location: [facility] ENDOSCOPY;  Service: Endoscopy;  Laterality: N/A;  . IR IMAGING GUIDED PORT INSERTION  06/24/2020  . KIDNEY DONATION Left 2002  . OVARIAN CYST REMOVAL Left 2002   Ovarian mass removal  . TONSILLECTOMY  1973  . UPPER GASTROINTESTINAL ENDOSCOPY   11/19/2014   Benign appearing esophageal stricture-Dilated.    Medical History: Past Medical History:  Diagnosis Date  . Constipation   . Cystitis bacillary, chronic   . Depression   . Dysphagia    Dx by Beverely Risen on 10/15/14  . Eosinophilic esophagitis 11/19/2014   GE junction benign stricture s/p balloon dilation, retained food contents. ?gastroparesis versus secondary to EE.  Marland Kitchen Esophageal dysphagia   . GERD (gastroesophageal reflux disease)   . Heartburn   . Hypertension   . Multilevel degenerative disc disease   . Multiple sclerosis (HCC)   . Multiple sclerosis (HCC) 07/16/2015    Family History: Family History  Problem Relation Age of Onset  . Diabetes Mother   . Hypertension Mother   . Heart disease Mother   . Pulmonary embolism Mother   . Diabetes Father   . Diabetes Sister   . Hypertension Sister   . Heart disease Sister   . Bipolar disorder Sister   . Narcolepsy Brother   . Heart attack Maternal Grandmother   . Pancreatic cancer Maternal Grandfather   . Breast cancer Maternal Aunt     Social History   Socioeconomic History  . Marital status: Married    Spouse name: [name]  . Number of children: 1  . Years of education: Not on file  . Highest education level: Not on file  Occupational History  . Occupation: Disability  Tobacco Use  . Smoking status: Never Smoker  . Smokeless tobacco: Never Used  Vaping Use  . Vaping Use: Never used  Substance and Sexual Activity  . Alcohol use: No    Alcohol/week: 0.0 standard drinks  . Drug use: No  . Sexual activity: Not Currently  Other Topics Concern  . Not on file  Social History Narrative   Lives with husband, Halyn Flaugher   Right handed    Caffeine use: tea sometimes (mostly herbal)   Social Determinants of Health   Financial Resource Strain:   . Difficulty of Paying Living Expenses: Not on file  Food Insecurity:   . Worried About Programme researcher, broadcasting/film/video in the Last Year: Not on file  . Ran Out of  Food in the Last Year: Not on file  Transportation Needs:   . Lack of Transportation (Medical): Not on file  . Lack of Transportation (Non-Medical): Not on file  Physical Activity:   . Days of Exercise per Week: Not on file  . Minutes of Exercise per Session: Not on file  Stress:   . Feeling of Stress : Not on file  Social Connections:   . Frequency of Communication with Friends and Family: Not on file  . Frequency of Social Gatherings with Friends and Family: Not on file  . Attends Religious Services: Not on file  . Active Member of Clubs or Organizations: Not on file  . Attends Banker Meetings: Not on file  . Marital Status: Not on file  Intimate Partner Violence:   . Fear of Current or Ex-Partner: Not on file  . Emotionally Abused:  Not on file  . Physically Abused: Not on file  . Sexually Abused: Not on file      Review of Systems  Constitutional: Negative for chills, diaphoresis and fatigue.  HENT: Negative for ear pain, postnasal drip and sinus pressure.   Eyes: Negative for photophobia, discharge, redness, itching and visual disturbance.  Respiratory: Negative for cough, shortness of breath and wheezing.   Cardiovascular: Negative for chest pain, palpitations and leg swelling.  Gastrointestinal: Negative for abdominal pain, constipation, diarrhea, nausea and vomiting.  Genitourinary: Negative for dysuria and flank pain.  Musculoskeletal: Negative for arthralgias, back pain, gait problem and neck pain.  Skin: Negative for color change.  Allergic/Immunologic: Negative for environmental allergies and food allergies.  Neurological: Negative for dizziness and headaches.  Hematological: Does not bruise/bleed easily.  Psychiatric/Behavioral: Negative for agitation, behavioral problems (depression) and hallucinations.    Vital Signs: BP 120/80   Pulse 75   Temp 98 F (36.7 C)   Resp 16   Ht 5\' 5"  (1.651 m)   Wt 198 lb (89.8 kg)   SpO2 99%   BMI 32.95  kg/m    Physical Exam Vitals reviewed.  Constitutional:      Appearance: Normal appearance.  Cardiovascular:     Rate and Rhythm: Normal rate and regular rhythm.     Pulses: Normal pulses.     Heart sounds: Normal heart sounds.  Pulmonary:     Effort: Pulmonary effort is normal.     Breath sounds: Normal breath sounds.  Abdominal:     General: Abdomen is flat.     Palpations: Abdomen is soft.  Musculoskeletal:        General: Normal range of motion.     Cervical back: Normal range of motion.  Skin:    General: Skin is warm.  Neurological:     General: No focal deficit present.     Mental Status: She is alert and oriented to person, place, and time. Mental status is at baseline.  Psychiatric:        Mood and Affect: Mood normal.        Behavior: Behavior normal.        Thought Content: Thought content normal.     Assessment/Plan: 1. Major depressive disorder, recurrent episode, moderate (HCC) Symptoms remain stable at this time, continue with Cymbalta--requesting refills - DULoxetine (CYMBALTA) 30 MG capsule; Take 3 capsules (90 mg total) by mouth daily. TAKE 3 CAPSULES ONCE DAILY  Dispense: 270 capsule; Refill: 1  2. Screening for colon cancer Needs routine screening--refer to GI for colonscopy - Ambulatory referral to Gastroenterology  3. Multiple thyroid nodules Will repeat thyroid US for monitoring of nodules--will adjust plan of care as needed - US THYROID; Future  4. Allergy, sequela Symptoms remain stable on Flonase, requesting refills - fluticasone (FLONASE) 50 MCG/ACT nasal spray; Place 2 sprays into both nostrils daily.  Dispense: 16 g; Refill: 2  5. Multiple sclerosis (HCC) Stable at this time, continue with routine follow-up with neurology  6. Flu vaccine need - Flu Vaccine MDCK QUAD PF  General Counseling: Jasmin verbalizes understanding of the findings of todays visit and agrees with plan of treatment. I have discussed any further diagnostic  evaluation that may be needed or ordered today. We also reviewed her medications today. she has been encouraged to call the office with any questions or concerns that should arise related to todays visit.    Orders Placed This Encounter  Procedures  . US THYROID  . Flu  Vaccine MDCK QUAD PF  . Ambulatory referral to Gastroenterology    Meds ordered this encounter  Medications  . DULoxetine (CYMBALTA) 30 MG capsule    Sig: Take 3 capsules (90 mg total) by mouth daily. TAKE 3 CAPSULES ONCE DAILY    Dispense:  270 capsule    Refill:  1  . fluticasone (FLONASE) 50 MCG/ACT nasal spray    Sig: Place 2 sprays into both nostrils daily.    Dispense:  16 g    Refill:  2    Time spent: 30 Minutes Time spent includes review of chart, medications, test results and follow-up plan with the patient.  This patient was seen by Leeanne Deed AGNP-C in Collaboration with Dr Lyndon Code as a part of collaborative care agreement     Lubertha Basque. Morty Ortwein AGNP-C Internal medicine

## 2020-09-20 ENCOUNTER — Other Ambulatory Visit: Payer: Self-pay | Admitting: Adult Health

## 2020-09-20 ENCOUNTER — Encounter: Payer: Self-pay | Admitting: Hospice and Palliative Medicine

## 2020-09-20 ENCOUNTER — Ambulatory Visit: Payer: Medicare Other

## 2020-09-20 DIAGNOSIS — K219 Gastro-esophageal reflux disease without esophagitis: Secondary | ICD-10-CM

## 2020-09-20 DIAGNOSIS — E042 Nontoxic multinodular goiter: Secondary | ICD-10-CM | POA: Diagnosis not present

## 2020-09-23 ENCOUNTER — Encounter: Payer: Self-pay | Admitting: Urology

## 2020-09-23 ENCOUNTER — Ambulatory Visit (INDEPENDENT_AMBULATORY_CARE_PROVIDER_SITE_OTHER): Payer: Medicare Other | Admitting: Urology

## 2020-09-23 ENCOUNTER — Other Ambulatory Visit: Payer: Self-pay

## 2020-09-23 VITALS — BP 140/94 | HR 78 | Ht 65.0 in | Wt 205.4 lb

## 2020-09-23 DIAGNOSIS — N3946 Mixed incontinence: Secondary | ICD-10-CM

## 2020-09-23 MED ORDER — MIRABEGRON ER 50 MG PO TB24: 50.0000 mg | ORAL_TABLET | Freq: Every day | ORAL | 0 refills | Status: DC

## 2020-09-23 NOTE — Progress Notes (Signed)
09/23/2020 9:03 AM   Rose Clements [birth year] [id number]  Referring provider: Lyndon Code, MD [address] [place],  [place] [address]  Chief Complaint  Patient presents with  . Results    HPI: I was consulted to assess the patient is urinary incontinence.  She primarily has urge incontinence.  Sometimes she leaks with coughing sneezing.  She has infrequent bedwetting.  She can leak without awareness.  She has had multiple sclerosis for 10 years and is failed oxybutynin and Detrol and Vesicare.  He wears 4-5 pads a day moderately wet or soaked.  She voids with a good flow.  She can double void a large amount.  She voids every 2 hours gets up 3 times at night.  Mild grade 2 hypermobility the bladder neck and no stress incontinence or prolapse  Patient has mixed incontinence.  She has multiple sclerosis.  She double voids a large amount.  She is failed 3 medications.  She has frequency and nocturia.  She can leak without awareness.  She has mild bedwetting.  She is emptying well and will return for urodynamics.    Today Frequency stable.  Incontinence stable. On urodynamics maximum bladder capacity was 497 mL.  Bladder was unstable reaching pressure 12 cm water.  She had urgency but inhibited the contraction.  She had no stress incontinence with Valsalva pressure 115 cm water.  During voluntary voiding she voided 413 mL with a maximum flow of 60 mils per second.  Max voiding pressure 34 cm of water.  Residual 84 mL.  EMG was present mildly increased during voiding.  It was intermittent.  Bladder neck descent at 1 or 2 cm.  Small bladder urethral diverticulum or possible artifact noted.  She had trouble to initiate her stream and she was generating a contraction.  She did have external sphincter dyssynergia.  The details of the urodynamics are signed and dictated       PMH: Past Medical History:  Diagnosis Date  . Constipation   . Cystitis bacillary, chronic   . Depression   .  Dysphagia    Dx by Beverely Risen on 10/15/14  . Eosinophilic esophagitis 11/19/2014   GE junction benign stricture s/p balloon dilation, retained food contents. ?gastroparesis versus secondary to EE.  Marland Kitchen Esophageal dysphagia   . GERD (gastroesophageal reflux disease)   . Heartburn   . Hypertension   . Multilevel degenerative disc disease   . Multiple sclerosis (HCC)   . Multiple sclerosis (HCC) 07/16/2015    Surgical History: Past Surgical History:  Procedure Laterality Date  . ABDOMINAL HYSTERECTOMY  2003  . baclofen pump insertion Right 01/04/2018  . CARPAL TUNNEL RELEASE Right 1997  . CESAREAN SECTION  1989  . CYSTECTOMY  2002   From chest  . ESOPHAGOGASTRODUODENOSCOPY (EGD) WITH PROPOFOL N/A 05/19/2016   Procedure: ESOPHAGOGASTRODUODENOSCOPY (EGD) WITH PROPOFOL;  Surgeon: [name], MD;  Location: [facility] ENDOSCOPY;  Service: Endoscopy;  Laterality: N/A;  . IR IMAGING GUIDED PORT INSERTION  06/24/2020  . KIDNEY DONATION Left 2002  . OVARIAN CYST REMOVAL Left 2002   Ovarian mass removal  . TONSILLECTOMY  1973  . UPPER GASTROINTESTINAL ENDOSCOPY  11/19/2014   Benign appearing esophageal stricture-Dilated.    Home Medications:  Allergies as of 09/23/2020      Reactions   Lisinopril Hives      Medication List       Accurate as of September 23, 2020  9:03 AM. If you have any questions, ask your nurse  or doctor.        acetaminophen 500 MG tablet Commonly known as: TYLENOL Take 500 mg by mouth every 6 (six) hours as needed (for pain.).   baclofen 10 MG tablet Commonly known as: LIORESAL Take 10 mg by mouth See admin instructions. Take 1 tablet (10 mg) by mouth scheduled in the morning & evening, may take an additional dose at bedtime if needed for spasms.   cetirizine 10 MG tablet Commonly known as: ZYRTEC Take 10 mg by mouth daily.   DULoxetine 30 MG capsule Commonly known as: CYMBALTA Take 3 capsules (90 mg total) by mouth daily. TAKE 3 CAPSULES ONCE DAILY     fluticasone 50 MCG/ACT nasal spray Commonly known as: FLONASE Place 2 sprays into both nostrils daily.   pantoprazole 40 MG tablet Commonly known as: PROTONIX TAKE 1 TABLET BY MOUTH EVERY DAY   Polyethylene Glycol Powd Take 17 g by mouth daily. What changed:   when to take this  reasons to take this   rizatriptan 10 MG disintegrating tablet Commonly known as: MAXALT-MLT Take 1 tablet (10 mg total) by mouth as needed for migraine. May repeat in 2 hours if needed   topiramate 50 MG tablet Commonly known as: TOPAMAX Take 1 tablet (50 mg total) by mouth 2 (two) times daily.   Tysabri 300 MG/15ML injection Generic drug: natalizumab Inject 300 mg into the vein every 30 (thirty) days.   Vitamin D3 25 MCG (1000 UT) Caps Take 1,000 Units by mouth in the morning and at bedtime.       Allergies:  Allergies  Allergen Reactions  . Lisinopril Hives    Family History: Family History  Problem Relation Age of Onset  . Diabetes Mother   . Hypertension Mother   . Heart disease Mother   . Pulmonary embolism Mother   . Diabetes Father   . Diabetes Sister   . Hypertension Sister   . Heart disease Sister   . Bipolar disorder Sister   . Narcolepsy Brother   . Heart attack Maternal Grandmother   . Pancreatic cancer Maternal Grandfather   . Breast cancer Maternal Aunt     Social History:  reports that she has never smoked. She has never used smokeless tobacco. She reports that she does not drink alcohol and does not use drugs.  ROS:                                        Physical Exam: There were no vitals taken for this visit.  Constitutional:  Alert and oriented, No acute distress.  Laboratory Data: Lab Results  Component Value Date   WBC 7.7 05/23/2020   HGB 12.2 05/23/2020   HCT 36.7 05/23/2020   MCV 90 05/23/2020   PLT 301 05/23/2020    Lab Results  Component Value Date   CREATININE 0.96 02/07/2020    No results found for:  PSA  No results found for: TESTOSTERONE  No results found for: HGBA1C  Urinalysis    Component Value Date/Time   APPEARANCEUR Clear 07/08/2020 1111   GLUCOSEU Negative 07/08/2020 1111   BILIRUBINUR Negative 07/08/2020 1111   PROTEINUR Negative 07/08/2020 1111   NITRITE Negative 07/08/2020 1111   LEUKOCYTESUR Negative 07/08/2020 1111    Pertinent Imaging:   Assessment & Plan: Patient has a neurogenic bladder with external sphincter dyssynergia.  She would be at higher risk with Botox.  Percutaneous tibial nerve stimulation and InterStim are good options for her.  Her leakage without awareness and bedwetting and urge incontinence are due to overactive bladder.  She has very minimal stress incontinence based upon the clinical presentation.  Reassess in 6 weeks on Myrbetriq 50 mg samples and prescription  There are no diagnoses linked to this encounter.  No follow-ups on file.  Martina Sinner, MD  [facility] [address], [address] [place], [place] [address] [phone]

## 2020-09-24 NOTE — Progress Notes (Signed)
One of the thyroid nodule is TR 4, she should get a biopsy of nodule, will need to see ENT

## 2020-10-03 ENCOUNTER — Telehealth: Payer: PRIVATE HEALTH INSURANCE

## 2020-10-07 DIAGNOSIS — Z451 Encounter for adjustment and management of infusion pump: Secondary | ICD-10-CM | POA: Diagnosis not present

## 2020-10-07 DIAGNOSIS — G35 Multiple sclerosis: Secondary | ICD-10-CM | POA: Diagnosis not present

## 2020-10-10 ENCOUNTER — Telehealth (INDEPENDENT_AMBULATORY_CARE_PROVIDER_SITE_OTHER): Payer: Self-pay | Admitting: Gastroenterology

## 2020-10-10 DIAGNOSIS — Z1211 Encounter for screening for malignant neoplasm of colon: Secondary | ICD-10-CM

## 2020-10-10 MED ORDER — NA SULFATE-K SULFATE-MG SULF 17.5-3.13-1.6 GM/177ML PO SOLN: 1.0000 | Freq: Once | ORAL | 0 refills | Status: AC

## 2020-10-10 NOTE — Progress Notes (Signed)
Gastroenterology Pre-Procedure Review  Request Date: Monday 10/28/20 Requesting Physician: Dr. Wohl  PATIENT REVIEW QUESTIONS: The patient responded to the following health history questions as indicated:    1. Are you having any GI issues? Stomach cramps 2. Do you have a personal history of Polyps? no 3. Do you have a family history of Colon Cancer or Polyps? no 4. Diabetes Mellitus? no 5. Joint replacements in the past 12 months?no 6. Major health problems in the past 3 months?no 7. Any artificial heart valves, MVP, or defibrillator?Patient has pain pump on right side of abdomen    MEDICATIONS & ALLERGIES:    Patient reports the following regarding taking any anticoagulation/antiplatelet therapy:   Plavix, Coumadin, Eliquis, Xarelto, Lovenox, Pradaxa, Brilinta, or Effient? no Aspirin? no  Patient confirms/reports the following medications:  Current Outpatient Medications  Medication Sig Dispense Refill  . acetaminophen (TYLENOL) 500 MG tablet Take 500 mg by mouth every 6 (six) hours as needed (for pain.).    . baclofen (LIORESAL) 10 MG tablet Take 10 mg by mouth See admin instructions. Take 1 tablet (10 mg) by mouth scheduled in the morning & evening, may take an additional dose at bedtime if needed for spasms.    . cetirizine (ZYRTEC) 10 MG tablet Take 10 mg by mouth daily.    . Cholecalciferol (VITAMIN D3) 1000 UNITS CAPS Take 1,000 Units by mouth in the morning and at bedtime.     . DULoxetine (CYMBALTA) 30 MG capsule Take 3 capsules (90 mg total) by mouth daily. TAKE 3 CAPSULES ONCE DAILY 270 capsule 1  . fluticasone (FLONASE) 50 MCG/ACT nasal spray Place 2 sprays into both nostrils daily. 16 g 2  . mirabegron ER (MYRBETRIQ) 50 MG TB24 tablet Take 1 tablet (50 mg total) by mouth daily. 30 tablet 0  . natalizumab (TYSABRI) 300 MG/15ML injection Inject 300 mg into the vein every 30 (thirty) days.     . pantoprazole (PROTONIX) 40 MG tablet TAKE 1 TABLET BY MOUTH EVERY DAY 90 tablet  1  . Polyethylene Glycol POWD Take 17 g by mouth daily. (Patient taking differently: Take 17 g by mouth daily as needed (constipation.). ) 527 g 12  . rizatriptan (MAXALT-MLT) 10 MG disintegrating tablet Take 1 tablet (10 mg total) by mouth as needed for migraine. May repeat in 2 hours if needed 9 tablet 11  . topiramate (TOPAMAX) 50 MG tablet Take 1 tablet (50 mg total) by mouth 2 (two) times daily. 180 tablet 1  . Na Sulfate-K Sulfate-Mg Sulf 17.5-3.13-1.6 GM/177ML SOLN Take 1 kit by mouth once for 1 dose. 354 mL 0   No current facility-administered medications for this visit.    Patient confirms/reports the following allergies:  Allergies  Allergen Reactions  . Lisinopril Hives    Orders Placed This Encounter  Procedures  . Procedural/ Surgical Case Request: COLONOSCOPY WITH PROPOFOL    Standing Status:   Standing    Number of Occurrences:   1    Order Specific Question:   Pre-op diagnosis    Answer:   Screening colonoscopy    Order Specific Question:   CPT Code    Answer:   45378    AUTHORIZATION INFORMATION Primary Insurance: 1D#: Group #:  Secondary Insurance: 1D#: Group #:  SCHEDULE INFORMATION: Date: 10/28/20 Time: Location:[facility] 

## 2020-10-11 ENCOUNTER — Other Ambulatory Visit: Payer: Self-pay

## 2020-10-11 DIAGNOSIS — Z1211 Encounter for screening for malignant neoplasm of colon: Secondary | ICD-10-CM

## 2020-10-14 DIAGNOSIS — G35 Multiple sclerosis: Secondary | ICD-10-CM | POA: Diagnosis not present

## 2020-10-22 ENCOUNTER — Other Ambulatory Visit: Payer: Self-pay

## 2020-10-22 ENCOUNTER — Encounter: Payer: Self-pay | Admitting: Gastroenterology

## 2020-10-24 ENCOUNTER — Other Ambulatory Visit: Payer: Self-pay

## 2020-10-24 ENCOUNTER — Other Ambulatory Visit
Admission: RE | Admit: 2020-10-24 | Discharge: 2020-10-24 | Disposition: A | Discharge status: Home / Self Care | Payer: Medicare Other | Source: Ambulatory Visit | Attending: Gastroenterology | Admitting: Gastroenterology

## 2020-10-24 DIAGNOSIS — Z01812 Encounter for preprocedural laboratory examination: Secondary | ICD-10-CM | POA: Diagnosis not present

## 2020-10-24 DIAGNOSIS — Z20822 Contact with and (suspected) exposure to covid-19: Secondary | ICD-10-CM | POA: Insufficient documentation

## 2020-10-25 ENCOUNTER — Ambulatory Visit (INDEPENDENT_AMBULATORY_CARE_PROVIDER_SITE_OTHER): Payer: Medicare Other | Admitting: Nurse Practitioner

## 2020-10-25 ENCOUNTER — Encounter: Payer: Self-pay | Admitting: Nurse Practitioner

## 2020-10-25 VITALS — BP 128/81 | HR 67 | Temp 97.9°F | Resp 16 | Ht 65.0 in | Wt 199.2 lb

## 2020-10-25 DIAGNOSIS — E042 Nontoxic multinodular goiter: Secondary | ICD-10-CM

## 2020-10-25 LAB — SARS CORONAVIRUS 2 (TAT 6-24 HRS): SARS Coronavirus 2: NEGATIVE

## 2020-10-25 NOTE — Discharge Instructions (Signed)
General Anesthesia, Adult, Care After This sheet gives you information about how to care for yourself after your procedure. Your health care provider may also give you more specific instructions. If you have problems or questions, contact your health care provider. What can I expect after the procedure? After the procedure, the following side effects are common:  Pain or discomfort at the IV site.  Nausea.  Vomiting.  Sore throat.  Trouble concentrating.  Feeling cold or chills.  Weak or tired.  Sleepiness and fatigue.  Soreness and body aches. These side effects can affect parts of the body that were not involved in surgery. Follow these instructions at home:  For at least 24 hours after the procedure:  Have a responsible adult stay with you. It is important to have someone help care for you until you are awake and alert.  Rest as needed.  Do not: ? Participate in activities in which you could fall or become injured. ? Drive. ? Use heavy machinery. ? Drink alcohol. ? Take sleeping pills or medicines that cause drowsiness. ? Make important decisions or sign legal documents. ? Take care of children on your own. Eating and drinking  Follow any instructions from your health care provider about eating or drinking restrictions.  When you feel hungry, start by eating small amounts of foods that are soft and easy to digest (bland), such as toast. Gradually return to your regular diet.  Drink enough fluid to keep your urine pale yellow.  If you vomit, rehydrate by drinking water, juice, or clear broth. General instructions  If you have sleep apnea, surgery and certain medicines can increase your risk for breathing problems. Follow instructions from your health care provider about wearing your sleep device: ? Anytime you are sleeping, including during daytime naps. ? While taking prescription pain medicines, sleeping medicines, or medicines that make you drowsy.  Return to  your normal activities as told by your health care provider. Ask your health care provider what activities are safe for you.  Take over-the-counter and prescription medicines only as told by your health care provider.  If you smoke, do not smoke without supervision.  Keep all follow-up visits as told by your health care provider. This is important. Contact a health care provider if:  You have nausea or vomiting that does not get better with medicine.  You cannot eat or drink without vomiting.  You have pain that does not get better with medicine.  You are unable to pass urine.  You develop a skin rash.  You have a fever.  You have redness around your IV site that gets worse. Get help right away if:  You have difficulty breathing.  You have chest pain.  You have blood in your urine or stool, or you vomit blood. Summary  After the procedure, it is common to have a sore throat or nausea. It is also common to feel tired.  Have a responsible adult stay with you for the first 24 hours after general anesthesia. It is important to have someone help care for you until you are awake and alert.  When you feel hungry, start by eating small amounts of foods that are soft and easy to digest (bland), such as toast. Gradually return to your regular diet.  Drink enough fluid to keep your urine pale yellow.  Return to your normal activities as told by your health care provider. Ask your health care provider what activities are safe for you. This information is not   intended to replace advice given to you by your health care provider. Make sure you discuss any questions you have with your health care provider. Document Revised: 11/26/2017 Document Reviewed: 07/09/2017 Elsevier Patient Education  2020 Elsevier Inc.  

## 2020-10-25 NOTE — Progress Notes (Signed)
[facility] [address] [place], [place] [address]  Internal MEDICINE  Office Visit Note  Patient Name: Rose Clements  [id number]  [id number]  Date of Service: 11/23/2020  Chief Complaint  Patient presents with  . Follow-up    ultra sound results  . Depression  . Gastroesophageal Reflux  . Hypertension  . controlled substance form    reviewed with PT  . Quality Metric Gaps    colonoscopy, Hep C    The patient is here for follow up visit.  -scheduled for colonoscopy 10/28/2020.  -increased GERD - does sleep with her head elevated at 30 degrees. Does cause her to spit up. This is clear, sour tasting fluid.  -had ultrasound of thyroid since last visit. Two small nodules on right lobe of thyroid. First nodule is 1.2 X 0.6 X 0.6. there is colloid cyst on right lobe 0.5X0.5X0.4cm in diameter. Left thyroid lobe has single, small nodule 0.5 X 0.4 X 0.6 cm in diameter. Will need to repeat thryoid ultrasound in one year.       Current Medication: Outpatient Encounter Medications as of 10/25/2020  Medication Sig Note  . acetaminophen (TYLENOL) 500 MG tablet Take 500 mg by mouth every 6 (six) hours as needed (for pain.).   Marland Kitchen baclofen (LIORESAL) 10 MG tablet Take 10 mg by mouth See admin instructions. Take 1 tablet (10 mg) by mouth scheduled in the morning & evening, may take an additional dose at bedtime if needed for spasms.   . cetirizine (ZYRTEC) 10 MG tablet Take 10 mg by mouth daily.   . Cholecalciferol (VITAMIN D3) 1000 UNITS CAPS Take 1,000 Units by mouth in the morning and at bedtime.    . DULoxetine (CYMBALTA) 30 MG capsule Take 3 capsules (90 mg total) by mouth daily. TAKE 3 CAPSULES ONCE DAILY   . fluticasone (FLONASE) 50 MCG/ACT nasal spray Place 2 sprays into both nostrils daily.   . natalizumab (TYSABRI) 300 MG/15ML injection Inject 300 mg into the vein every 30 (thirty) days.  11/18/2020: JCV ab drawn on 11/11/20 negative, index: 0.15   . pantoprazole  (PROTONIX) 40 MG tablet TAKE 1 TABLET BY MOUTH EVERY DAY   . Polyethylene Glycol POWD Take 17 g by mouth daily. (Patient taking differently: Take 17 g by mouth daily as needed (constipation.). )   . [DISCONTINUED] mirabegron ER (MYRBETRIQ) 50 MG TB24 tablet Take 1 tablet (50 mg total) by mouth daily.   . [DISCONTINUED] rizatriptan (MAXALT-MLT) 10 MG disintegrating tablet Take 1 tablet (10 mg total) by mouth as needed for migraine. May repeat in 2 hours if needed 06/20/2020: New medication patient has not picked up from pharmacy  . [DISCONTINUED] topiramate (TOPAMAX) 50 MG tablet Take 1 tablet (50 mg total) by mouth 2 (two) times daily.    No facility-administered encounter medications on file as of 10/25/2020.    Surgical History: Past Surgical History:  Procedure Laterality Date  . ABDOMINAL HYSTERECTOMY  2003  . baclofen pump insertion Right 01/04/2018  . CARPAL TUNNEL RELEASE Right 1997  . CESAREAN SECTION  1989  . COLONOSCOPY WITH PROPOFOL N/A 10/28/2020   Procedure: COLONOSCOPY WITH PROPOFOL;  Surgeon: [name], MD;  Location: [facility];  Service: Endoscopy;  Laterality: N/A;  PRIORITY 4 port a cath  . CYSTECTOMY  2002   From chest  . ESOPHAGOGASTRODUODENOSCOPY (EGD) WITH PROPOFOL N/A 05/19/2016   Procedure: ESOPHAGOGASTRODUODENOSCOPY (EGD) WITH PROPOFOL;  Surgeon: [name], MD;  Location: [facility];  Service: Endoscopy;  Laterality:  N/A;  . IR IMAGING GUIDED PORT INSERTION  06/24/2020  . KIDNEY DONATION Left 2002  . OVARIAN CYST REMOVAL Left 2002   Ovarian mass removal  . TONSILLECTOMY  1973  . UPPER GASTROINTESTINAL ENDOSCOPY  11/19/2014   Benign appearing esophageal stricture-Dilated.    Medical History: Past Medical History:  Diagnosis Date  . Constipation   . Cystitis bacillary, chronic   . Depression   . Dysphagia    Dx by Beverely Risen on 10/15/14  . Eosinophilic esophagitis 11/19/2014   GE junction benign stricture s/p balloon dilation, retained  food contents. ?gastroparesis versus secondary to EE.  Marland Kitchen Esophageal dysphagia   . GERD (gastroesophageal reflux disease)   . Heartburn   . Hypertension   . Migraine headache    "couple times each month"  . Multilevel degenerative disc disease   . Multiple sclerosis (HCC) 07/16/2015  . Port-A-Cath in place    right side  . Presence of intrathecal baclofen pump    right abdomen  . Vertigo    Rare - none in last 6 months    Family History: Family History  Problem Relation Age of Onset  . Diabetes Mother   . Hypertension Mother   . Heart disease Mother   . Pulmonary embolism Mother   . Diabetes Father   . Diabetes Sister   . Hypertension Sister   . Heart disease Sister   . Bipolar disorder Sister   . Narcolepsy Brother   . Heart attack Maternal Grandmother   . Pancreatic cancer Maternal Grandfather   . Breast cancer Maternal Aunt     Social History   Socioeconomic History  . Marital status: Married    Spouse name: [name]  . Number of children: 1  . Years of education: Not on file  . Highest education level: Not on file  Occupational History  . Occupation: Disability  Tobacco Use  . Smoking status: Never Smoker  . Smokeless tobacco: Never Used  Vaping Use  . Vaping Use: Never used  Substance and Sexual Activity  . Alcohol use: No    Alcohol/week: 0.0 standard drinks    Comment: rare wine on Holidays  . Drug use: No  . Sexual activity: Not Currently  Other Topics Concern  . Not on file  Social History Narrative   Lives with husband, Pattye Meda   Right handed    Caffeine use: tea sometimes (mostly herbal)   Social Determinants of Health   Financial Resource Strain: Not on file  Food Insecurity: Not on file  Transportation Needs: Not on file  Physical Activity: Not on file  Stress: Not on file  Social Connections: Not on file  Intimate Partner Violence: Not on file      Review of Systems  Constitutional: Negative for activity change, chills,  fatigue and unexpected weight change.  HENT: Negative for congestion, postnasal drip, rhinorrhea, sneezing and sore throat.   Respiratory: Negative for cough, chest tightness, shortness of breath and wheezing.   Cardiovascular: Negative for chest pain and palpitations.  Gastrointestinal: Negative for abdominal pain, constipation, diarrhea, nausea and vomiting.       Increased GERD symptoms.   Endocrine: Negative for cold intolerance, heat intolerance, polydipsia and polyuria.       Recent thyroid ultrasound to be reviewed today.   Musculoskeletal: Negative for arthralgias, back pain, joint swelling and neck pain.  Skin: Negative for rash.  Allergic/Immunologic: Negative for environmental allergies.  Neurological: Negative for dizziness, tremors, numbness and headaches.  Hematological:  Negative for adenopathy. Does not bruise/bleed easily.  Psychiatric/Behavioral: Negative for behavioral problems (Depression), sleep disturbance and suicidal ideas. The patient is not nervous/anxious.     Today's Vitals   10/25/20 1100  BP: 128/81  Pulse: 67  Resp: 16  Temp: 97.9 F (36.6 C)  SpO2: 99%  Weight: 199 lb 3.2 oz (90.4 kg)  Height: 5\' 5"  (1.651 m)   Body mass index is 33.15 kg/m.  Physical Exam Vitals and nursing note reviewed.  Constitutional:      General: She is not in acute distress.    Appearance: Normal appearance. She is well-developed and well-nourished. She is not diaphoretic.  HENT:     Head: Normocephalic and atraumatic.     Mouth/Throat:     Mouth: Oropharynx is clear and moist.     Pharynx: No oropharyngeal exudate.  Eyes:     Extraocular Movements: EOM normal.     Pupils: Pupils are equal, round, and reactive to light.  Neck:     Thyroid: Thyromegaly present.     Vascular: No carotid bruit or JVD.     Trachea: No tracheal deviation.  Cardiovascular:     Rate and Rhythm: Normal rate and regular rhythm.     Heart sounds: Normal heart sounds. No murmur heard. No  friction rub. No gallop.   Pulmonary:     Effort: Pulmonary effort is normal. No respiratory distress.     Breath sounds: Normal breath sounds. No wheezing or rales.  Chest:     Chest wall: No tenderness.  Abdominal:     Palpations: Abdomen is soft.  Musculoskeletal:        General: Normal range of motion.     Cervical back: Normal range of motion and neck supple.  Lymphadenopathy:     Cervical: No cervical adenopathy.  Skin:    General: Skin is warm and dry.  Neurological:     General: No focal deficit present.     Mental Status: She is alert and oriented to person, place, and time.     Cranial Nerves: No cranial nerve deficit.  Psychiatric:        Mood and Affect: Mood and affect and mood normal.        Behavior: Behavior normal.        Thought Content: Thought content normal.        Judgment: Judgment normal.    Assessment/Plan: 1. Multiple thyroid nodules Reviewed ultrasound of thyroid since last visit. Two small nodules on right lobe of thyroid. First nodule is 1.2 X 0.6 X 0.6. there is colloid cyst on right lobe 0.5X0.5X0.4cm in diameter. Left thyroid lobe has single, small nodule 0.5 X 0.4 X 0.6 cm in diameter. Will need to repeat thryoid ultrasound in one year. Refer as indicated.    General Counseling: Maizy verbalizes understanding of the findings of todays visit and agrees with plan of treatment. I have discussed any further diagnostic evaluation that may be needed or ordered today. We also reviewed her medications today. she has been encouraged to call the office with any questions or concerns that should arise related to todays visit.   This patient was seen by FNP Collaboration with Dr Vincent Gros as a part of collaborative care agreement  Total time spent: 25 Minutes   Time spent includes review of chart, medications, test results, and follow up plan with the patient.      Dr Lyndon Code Internal medicine

## 2020-10-28 ENCOUNTER — Ambulatory Visit
Admission: RE | Admit: 2020-10-28 | Discharge: 2020-10-28 | Disposition: A | Discharge status: Home / Self Care | Payer: Medicare Other | Attending: Gastroenterology | Admitting: Gastroenterology

## 2020-10-28 ENCOUNTER — Other Ambulatory Visit: Payer: Self-pay

## 2020-10-28 ENCOUNTER — Encounter: Payer: Self-pay | Admitting: Gastroenterology

## 2020-10-28 ENCOUNTER — Encounter
Admission: RE | Disposition: A | Discharge status: Home / Self Care | Payer: Self-pay | Source: Home / Self Care | Attending: Gastroenterology

## 2020-10-28 ENCOUNTER — Ambulatory Visit: Payer: Medicare Other | Admitting: Anesthesiology

## 2020-10-28 DIAGNOSIS — Z1211 Encounter for screening for malignant neoplasm of colon: Secondary | ICD-10-CM | POA: Insufficient documentation

## 2020-10-28 DIAGNOSIS — Z79899 Other long term (current) drug therapy: Secondary | ICD-10-CM | POA: Diagnosis not present

## 2020-10-28 DIAGNOSIS — K5730 Diverticulosis of large intestine without perforation or abscess without bleeding: Secondary | ICD-10-CM | POA: Insufficient documentation

## 2020-10-28 DIAGNOSIS — Z1210 Encounter for screening for malignant neoplasm of intestinal tract, unspecified: Secondary | ICD-10-CM | POA: Diagnosis not present

## 2020-10-28 HISTORY — DX: Presence of other specified devices: Z97.8

## 2020-10-28 HISTORY — DX: Presence of other vascular implants and grafts: Z95.828

## 2020-10-28 HISTORY — DX: Dizziness and giddiness: R42

## 2020-10-28 HISTORY — DX: Migraine, unspecified, not intractable, without status migrainosus: G43.909

## 2020-10-28 HISTORY — PX: COLONOSCOPY WITH PROPOFOL: SHX5780

## 2020-10-28 SURGERY — COLONOSCOPY WITH PROPOFOL
Anesthesia: General | Site: Rectum

## 2020-10-28 MED ORDER — PROPOFOL 10 MG/ML IV BOLUS
INTRAVENOUS | Status: DC | PRN
  Administered 2020-10-28: 40 mg via INTRAVENOUS
  Administered 2020-10-28 (×2): 30 mg via INTRAVENOUS
  Administered 2020-10-28: 40 mg via INTRAVENOUS
  Administered 2020-10-28: 30 mg via INTRAVENOUS
  Administered 2020-10-28: 20 mg via INTRAVENOUS
  Administered 2020-10-28 (×2): 30 mg via INTRAVENOUS
  Administered 2020-10-28: 50 mg via INTRAVENOUS
  Administered 2020-10-28: 40 mg via INTRAVENOUS
  Administered 2020-10-28: 100 mg via INTRAVENOUS

## 2020-10-28 MED ORDER — STERILE WATER FOR IRRIGATION IR SOLN: Status: DC | PRN

## 2020-10-28 MED ORDER — LIDOCAINE HCL (CARDIAC) PF 100 MG/5ML IV SOSY
PREFILLED_SYRINGE | INTRAVENOUS | Status: DC | PRN
  Administered 2020-10-28: 30 mg via INTRAVENOUS

## 2020-10-28 MED ORDER — LACTATED RINGERS IV SOLN: INTRAVENOUS | Status: DC

## 2020-10-28 SURGICAL SUPPLY — 6 items
GOWN CVR UNV OPN BCK APRN NK (MISCELLANEOUS) ×2 IMPLANT
GOWN ISOL THUMB LOOP REG UNIV (MISCELLANEOUS) ×6
KIT PRC NS LF DISP ENDO (KITS) ×1 IMPLANT
KIT PROCEDURE OLYMPUS (KITS) ×3
MANIFOLD NEPTUNE II (INSTRUMENTS) ×3 IMPLANT
WATER STERILE IRR 250ML POUR (IV SOLUTION) ×3 IMPLANT

## 2020-10-28 NOTE — Transfer of Care (Signed)
Immediate Anesthesia Transfer of Care Note  Patient: Rose Clements  Procedure(s) Performed: COLONOSCOPY WITH PROPOFOL (N/A Rectum)  Patient Location: PACU  Anesthesia Type: General  Level of Consciousness: awake, alert  and patient cooperative  Airway and Oxygen Therapy: Patient Spontanous Breathing and Patient connected to supplemental oxygen  Post-op Assessment: Post-op Vital signs reviewed, Patient's Cardiovascular Status Stable, Respiratory Function Stable, Patent Airway and No signs of Nausea or vomiting  Post-op Vital Signs: Reviewed and stable  Complications: No complications documented.

## 2020-10-28 NOTE — Anesthesia Procedure Notes (Signed)
Date/Time: 10/28/2020 10:06 AM Performed by: Maree Krabbe, CRNA Pre-anesthesia Checklist: Patient identified, Emergency Drugs available, Suction available, Timeout performed and Patient being monitored Patient Re-evaluated:Patient Re-evaluated prior to induction Oxygen Delivery Method: Nasal cannula Placement Confirmation: positive ETCO2

## 2020-10-28 NOTE — Op Note (Addendum)
[facility] Patient Name: Rose Clements Procedure Date: 10/28/2020 10:00 AM MRN: [id number] Account #: [id number] Date of Birth: 19-Apr-1961 Admit Type: Outpatient Age: 59 Room: [facility] OR ROOM 01 Gender: Female Note Status: Supervisor Override Procedure:             Colonoscopy Indications:           Screening for colorectal malignant neoplasm Providers:             [name] MD, MD Referring MD:          Lyndon Code, MD (Referring MD) Medicines:             Propofol per Anesthesia Complications:         No immediate complications. Procedure:             Pre-Anesthesia Assessment:                        - Prior to the procedure, a History and Physical was                         performed, and patient medications and allergies were                         reviewed. The patient's tolerance of previous                         anesthesia was also reviewed. The risks and benefits                         of the procedure and the sedation options and risks                         were discussed with the patient. All questions were                         answered, and informed consent was obtained. Prior                         Anticoagulants: The patient has taken no previous                         anticoagulant or antiplatelet agents. ASA Grade                         Assessment: II - A patient with mild systemic disease.                         After reviewing the risks and benefits, the patient                         was deemed in satisfactory condition to undergo the                         procedure.                        After obtaining informed consent, the colonoscope was  passed under direct vision. Throughout the procedure,                         the patient's blood pressure, pulse, and oxygen                         saturations were monitored continuously. The was                         introduced through the  anus and advanced to the the                         cecum, identified by appendiceal orifice and ileocecal                         valve. The colonoscopy was performed without                         difficulty. The patient tolerated the procedure well.                         The quality of the bowel preparation was good. Findings:      The perianal and digital rectal examinations were normal.      A few small-mouthed diverticula were found in the sigmoid colon. Impression:            - Diverticulosis in the sigmoid colon.                        - No specimens collected. Recommendation:        - Discharge patient to home.                        - Resume previous diet.                        - Continue present medications.                        - Repeat colonoscopy in 10 years for screening                         purposes.                        - unless any change in family history or lower GI                         problems. Procedure Code(s):     --- Professional ---                        [phone], Colonoscopy, flexible; diagnostic, including                         collection of specimen(s) by brushing or washing, when                         performed (separate procedure) Diagnosis Code(s):     --- Professional ---  Z12.11, Encounter for screening for malignant neoplasm                         of colon CPT copyright 2019 American Medical Association. All rights reserved. The codes documented in this report are preliminary and upon coder review may  be revised to meet current compliance requirements. [name] MD, MD 10/28/2020 10:35:14 AM This report has been signed electronically. Number of Addenda: 0 Note Initiated On: 10/28/2020 10:00 AM Scope Withdrawal Time: 0 hours 9 minutes 59 seconds  Total Procedure Duration: 0 hours 22 minutes 55 seconds  Estimated Blood Loss:  Estimated blood loss: none.      [facility]

## 2020-10-28 NOTE — Anesthesia Preprocedure Evaluation (Signed)
Anesthesia Evaluation  Patient identified by MRN, date of birth, ID band Patient awake    Reviewed: Allergy & Precautions, H&P , NPO status , Patient's Chart, lab work & pertinent test results  History of Anesthesia Complications Negative for: history of anesthetic complications  Airway Mallampati: II  TM Distance: >3 FB Neck ROM: full    Dental no notable dental hx.    Pulmonary neg pulmonary ROS,    Pulmonary exam normal        Cardiovascular hypertension, On Medications  Rhythm:regular Rate:Normal     Neuro/Psych  Headaches, Anxiety    GI/Hepatic Neg liver ROS, Medicated,  Endo/Other    Renal/GU      Musculoskeletal   Abdominal   Peds  Hematology negative hematology ROS (+)   Anesthesia Other Findings   Reproductive/Obstetrics                             Anesthesia Physical Anesthesia Plan  ASA: II  Anesthesia Plan: General   Post-op Pain Management:    Induction:   PONV Risk Score and Plan: 2 and Treatment may vary due to age or medical condition, Ondansetron and Propofol infusion  Airway Management Planned:   Additional Equipment:   Intra-op Plan:   Post-operative Plan:   Informed Consent: I have reviewed the patients History and Physical, chart, labs and discussed the procedure including the risks, benefits and alternatives for the proposed anesthesia with the patient or authorized representative who has indicated his/her understanding and acceptance.       Plan Discussed with:   Anesthesia Plan Comments:         Anesthesia Quick Evaluation

## 2020-10-28 NOTE — Anesthesia Postprocedure Evaluation (Signed)
Anesthesia Post Note  Patient: Rose Clements  Procedure(s) Performed: COLONOSCOPY WITH PROPOFOL (N/A Rectum)     Patient location during evaluation: PACU Anesthesia Type: General Level of consciousness: awake and alert Pain management: pain level controlled Vital Signs Assessment: post-procedure vital signs reviewed and stable Respiratory status: spontaneous breathing Cardiovascular status: stable Anesthetic complications: no   No complications documented.  Marvis Repress

## 2020-10-28 NOTE — H&P (Signed)
[name], MD [facility] [address], [address] [place], [place] [address] Phone: [phone] Fax : [phone]  Primary Care Physician:  Lyndon Code, MD Primary Gastroenterologist:  Dr. Servando Snare  Pre-Procedure History & Physical: HPI:  Rose Clements is a 59 y.o. female is here for a screening colonoscopy.   Past Medical History:  Diagnosis Date  . Constipation   . Cystitis bacillary, chronic   . Depression   . Dysphagia    Dx by Beverely Risen on 10/15/14  . Eosinophilic esophagitis 11/19/2014   GE junction benign stricture s/p balloon dilation, retained food contents. ?gastroparesis versus secondary to EE.  Marland Kitchen Esophageal dysphagia   . GERD (gastroesophageal reflux disease)   . Heartburn   . Hypertension   . Migraine headache    "couple times each month"  . Multilevel degenerative disc disease   . Multiple sclerosis (HCC) 07/16/2015  . Port-A-Cath in place    right side  . Presence of intrathecal baclofen pump    right abdomen  . Vertigo    Rare - none in last 6 months    Past Surgical History:  Procedure Laterality Date  . ABDOMINAL HYSTERECTOMY  2003  . baclofen pump insertion Right 01/04/2018  . CARPAL TUNNEL RELEASE Right 1997  . CESAREAN SECTION  1989  . CYSTECTOMY  2002   From chest  . ESOPHAGOGASTRODUODENOSCOPY (EGD) WITH PROPOFOL N/A 05/19/2016   Procedure: ESOPHAGOGASTRODUODENOSCOPY (EGD) WITH PROPOFOL;  Surgeon: [name], MD;  Location: [facility] ENDOSCOPY;  Service: Endoscopy;  Laterality: N/A;  . IR IMAGING GUIDED PORT INSERTION  06/24/2020  . KIDNEY DONATION Left 2002  . OVARIAN CYST REMOVAL Left 2002   Ovarian mass removal  . TONSILLECTOMY  1973  . UPPER GASTROINTESTINAL ENDOSCOPY  11/19/2014   Benign appearing esophageal stricture-Dilated.    Prior to Admission medications   Medication Sig Start Date End Date Taking? Authorizing Provider  acetaminophen (TYLENOL) 500 MG tablet Take 500 mg by mouth every 6 (six) hours as needed (for pain.).   Yes [provider]  baclofen (LIORESAL) 10 MG tablet Take 10 mg by mouth See admin instructions. Take 1 tablet (10 mg) by mouth scheduled in the morning & evening, may take an additional dose at bedtime if needed for spasms. 04/15/20  Yes [provider]  cetirizine (ZYRTEC) 10 MG tablet Take 10 mg by mouth daily.   Yes [provider]  Cholecalciferol (VITAMIN D3) 1000 UNITS CAPS Take 1,000 Units by mouth in the morning and at bedtime.    Yes [provider]  DULoxetine (CYMBALTA) 30 MG capsule Take 3 capsules (90 mg total) by mouth daily. TAKE 3 CAPSULES ONCE DAILY 09/19/20  Yes Theotis Burrow, NP  fluticasone (FLONASE) 50 MCG/ACT nasal spray Place 2 sprays into both nostrils daily. 09/19/20  Yes Theotis Burrow, NP  mirabegron ER (MYRBETRIQ) 50 MG TB24 tablet Take 1 tablet (50 mg total) by mouth daily. 09/23/20  Yes MacDiarmid, Lorin Picket, MD  natalizumab (TYSABRI) 300 MG/15ML injection Inject 300 mg into the vein every 30 (thirty) days.    Yes [provider]  pantoprazole (PROTONIX) 40 MG tablet TAKE 1 TABLET BY MOUTH EVERY DAY 09/20/20  Yes Lyndon Code, MD  Polyethylene Glycol POWD Take 17 g by mouth daily. Patient taking differently: Take 17 g by mouth daily as needed (constipation.).  03/03/18  Yes Lyndon Code, MD  rizatriptan (MAXALT-MLT) 10 MG disintegrating tablet Take 1 tablet (10 mg total) by mouth as needed for  migraine. May repeat in 2 hours if needed 05/23/20  Yes Sater, Pearletha Furl, MD  topiramate (TOPAMAX) 50 MG tablet Take 1 tablet (50 mg total) by mouth 2 (two) times daily. 06/26/20  Yes Sater, Pearletha Furl, MD    Allergies as of 10/10/2020 - Review Complete 10/10/2020  Allergen Reaction Noted  . Lisinopril Hives 09/16/2018    Family History  Problem Relation Age of Onset  . Diabetes Mother   . Hypertension Mother   . Heart disease Mother   . Pulmonary embolism Mother   . Diabetes Father   . Diabetes Sister   . Hypertension Sister   . Heart  disease Sister   . Bipolar disorder Sister   . Narcolepsy Brother   . Heart attack Maternal Grandmother   . Pancreatic cancer Maternal Grandfather   . Breast cancer Maternal Aunt     Social History   Socioeconomic History  . Marital status: Married    Spouse name: [name]  . Number of children: 1  . Years of education: Not on file  . Highest education level: Not on file  Occupational History  . Occupation: Disability  Tobacco Use  . Smoking status: Never Smoker  . Smokeless tobacco: Never Used  Vaping Use  . Vaping Use: Never used  Substance and Sexual Activity  . Alcohol use: No    Alcohol/week: 0.0 standard drinks    Comment: rare wine on Holidays  . Drug use: No  . Sexual activity: Not Currently  Other Topics Concern  . Not on file  Social History Narrative   Lives with husband, Jaely Silman   Right handed    Caffeine use: tea sometimes (mostly herbal)   Social Determinants of Health   Financial Resource Strain:   . Difficulty of Paying Living Expenses: Not on file  Food Insecurity:   . Worried About Programme researcher, broadcasting/film/video in the Last Year: Not on file  . Ran Out of Food in the Last Year: Not on file  Transportation Needs:   . Lack of Transportation (Medical): Not on file  . Lack of Transportation (Non-Medical): Not on file  Physical Activity:   . Days of Exercise per Week: Not on file  . Minutes of Exercise per Session: Not on file  Stress:   . Feeling of Stress : Not on file  Social Connections:   . Frequency of Communication with Friends and Family: Not on file  . Frequency of Social Gatherings with Friends and Family: Not on file  . Attends Religious Services: Not on file  . Active Member of Clubs or Organizations: Not on file  . Attends Banker Meetings: Not on file  . Marital Status: Not on file  Intimate Partner Violence:   . Fear of Current or Ex-Partner: Not on file  . Emotionally Abused: Not on file  . Physically Abused: Not on file   . Sexually Abused: Not on file    Review of Systems: See HPI, otherwise negative ROS  Physical Exam: Ht 5\' 5"  (1.651 m)   Wt 93 kg   BMI 34.11 kg/m  General:   Alert,  pleasant and cooperative in NAD Head:  Normocephalic and atraumatic. Neck:  Supple; no masses or thyromegaly. Lungs:  Clear throughout to auscultation.    Heart:  Regular rate and rhythm. Abdomen:  Soft, nontender and nondistended. Normal bowel sounds, without guarding, and without rebound.   Neurologic:  Alert and  oriented x4;  grossly normal neurologically.  Impression/Plan: Deardra  Dollens is now here to undergo a screening colonoscopy.  Risks, benefits, and alternatives regarding colonoscopy have been reviewed with the patient.  Questions have been answered.  All parties agreeable.

## 2020-10-29 ENCOUNTER — Encounter: Payer: Self-pay | Admitting: Gastroenterology

## 2020-11-04 ENCOUNTER — Encounter: Payer: Self-pay | Admitting: Urology

## 2020-11-04 ENCOUNTER — Ambulatory Visit: Payer: Medicare Other | Admitting: Urology

## 2020-11-04 ENCOUNTER — Other Ambulatory Visit: Payer: Self-pay

## 2020-11-04 VITALS — BP 152/95 | HR 76

## 2020-11-04 DIAGNOSIS — N3946 Mixed incontinence: Secondary | ICD-10-CM | POA: Diagnosis not present

## 2020-11-04 MED ORDER — MIRABEGRON ER 50 MG PO TB24: 50.0000 mg | ORAL_TABLET | Freq: Every day | ORAL | 11 refills | Status: DC

## 2020-11-04 NOTE — Addendum Note (Signed)
Addended by: Milas Kocher A on: 11/04/2020 09:39 AM   Modules accepted: Orders

## 2020-11-04 NOTE — Progress Notes (Signed)
11/04/2020 9:27 AM   Rose Clements 03/25/1961 [id number]  Referring provider: Lyndon Code, MD [address] [place],  [place] [address]  Chief Complaint  Patient presents with  . Follow-up    HPI: I was consulted to assess the patient is urinary incontinence. She primarily has urge incontinence. Sometimes she leaks with coughing sneezing. She has infrequent bedwetting. She can leak without awareness. She has had multiple sclerosis for 10 years and is failed oxybutynin and Detrol and Vesicare.He wears 4-5 pads a day moderately wet or soaked.  She voids with a good flow. She can double void a large amount. She voids every 2 hours gets up 3 times at night.  Mild grade 2 hypermobility the bladder neck and no stress incontinence or prolapse  Patient has mixed incontinence. She has multiple sclerosis. She double voids a large amount. She is failed 3 medications. She has frequency and nocturia. She can leak without awareness. She has mild bedwetting.She is emptying well   On urodynamics maximum bladder capacity was 497 mL.  Bladder was unstable reaching pressure 12 cm water.  She had urgency but inhibited the contraction.  She had no stress incontinence with Valsalva pressure 115 cm water.  During voluntary voiding she voided 413 mL with a maximum flow of 60 mils per second.  Max voiding pressure 34 cm of water.  Residual 84 mL.  EMG was present mildly increased during voiding.  It was intermittent.  Bladder neck descent at 1 or 2 cm.  Small bladder urethral diverticulum or possible artifact noted.  She had trouble to initiate her stream and she was generating a contraction.  She did have external sphincter dyssynergia.   Patient has a neurogenic bladder with external sphincter dyssynergia.  She would be at higher risk with Botox.  Percutaneous tibial nerve stimulation and InterStim are good options for her.  Her leakage without awareness and bedwetting and urge incontinence  are due to overactive bladder.  She has very minimal stress incontinence based upon the clinical presentation.  Reassess in 6 weeks on Myrbetriq 50 mg samples and prescription  Today Frequency stable. Less nocturia.  Down to 2 pads a day moderately wet and quite pleased.  Not infected    PMH: Past Medical History:  Diagnosis Date  . Constipation   . Cystitis bacillary, chronic   . Depression   . Dysphagia    Dx by Beverely Risen on 10/15/14  . Eosinophilic esophagitis 11/19/2014   GE junction benign stricture s/p balloon dilation, retained food contents. ?gastroparesis versus secondary to EE.  Marland Kitchen Esophageal dysphagia   . GERD (gastroesophageal reflux disease)   . Heartburn   . Hypertension   . Migraine headache    "couple times each month"  . Multilevel degenerative disc disease   . Multiple sclerosis (HCC) 07/16/2015  . Port-A-Cath in place    right side  . Presence of intrathecal baclofen pump    right abdomen  . Vertigo    Rare - none in last 6 months    Surgical History: Past Surgical History:  Procedure Laterality Date  . ABDOMINAL HYSTERECTOMY  2003  . baclofen pump insertion Right 01/04/2018  . CARPAL TUNNEL RELEASE Right 1997  . CESAREAN SECTION  1989  . COLONOSCOPY WITH PROPOFOL N/A 10/28/2020   Procedure: COLONOSCOPY WITH PROPOFOL;  Surgeon: [name], MD;  Location: [facility];  Service: Endoscopy;  Laterality: N/A;  PRIORITY 4 port a cath  . CYSTECTOMY  2002   From chest  .  ESOPHAGOGASTRODUODENOSCOPY (EGD) WITH PROPOFOL N/A 05/19/2016   Procedure: ESOPHAGOGASTRODUODENOSCOPY (EGD) WITH PROPOFOL;  Surgeon: [name], MD;  Location: [facility];  Service: Endoscopy;  Laterality: N/A;  . IR IMAGING GUIDED PORT INSERTION  06/24/2020  . KIDNEY DONATION Left 2002  . OVARIAN CYST REMOVAL Left 2002   Ovarian mass removal  . TONSILLECTOMY  1973  . UPPER GASTROINTESTINAL ENDOSCOPY  11/19/2014   Benign appearing esophageal stricture-Dilated.    Home  Medications:  Allergies as of 11/04/2020      Reactions   Lisinopril Hives      Medication List       Accurate as of November 04, 2020  9:27 AM. If you have any questions, ask your nurse or doctor.        acetaminophen 500 MG tablet Commonly known as: TYLENOL Take 500 mg by mouth every 6 (six) hours as needed (for pain.).   baclofen 10 MG tablet Commonly known as: LIORESAL Take 10 mg by mouth See admin instructions. Take 1 tablet (10 mg) by mouth scheduled in the morning & evening, may take an additional dose at bedtime if needed for spasms.   cetirizine 10 MG tablet Commonly known as: ZYRTEC Take 10 mg by mouth daily.   DULoxetine 30 MG capsule Commonly known as: CYMBALTA Take 3 capsules (90 mg total) by mouth daily. TAKE 3 CAPSULES ONCE DAILY   fluticasone 50 MCG/ACT nasal spray Commonly known as: FLONASE Place 2 sprays into both nostrils daily.   mirabegron ER 50 MG Tb24 tablet Commonly known as: MYRBETRIQ Take 1 tablet (50 mg total) by mouth daily.   pantoprazole 40 MG tablet Commonly known as: PROTONIX TAKE 1 TABLET BY MOUTH EVERY DAY   Polyethylene Glycol Powd Take 17 g by mouth daily. What changed:   when to take this  reasons to take this   rizatriptan 10 MG disintegrating tablet Commonly known as: MAXALT-MLT Take 1 tablet (10 mg total) by mouth as needed for migraine. May repeat in 2 hours if needed   topiramate 50 MG tablet Commonly known as: TOPAMAX Take 1 tablet (50 mg total) by mouth 2 (two) times daily.   Tysabri 300 MG/15ML injection Generic drug: natalizumab Inject 300 mg into the vein every 30 (thirty) days.   Vitamin D3 25 MCG (1000 UT) Caps Take 1,000 Units by mouth in the morning and at bedtime.       Allergies:  Allergies  Allergen Reactions  . Lisinopril Hives    Family History: Family History  Problem Relation Age of Onset  . Diabetes Mother   . Hypertension Mother   . Heart disease Mother   . Pulmonary embolism  Mother   . Diabetes Father   . Diabetes Sister   . Hypertension Sister   . Heart disease Sister   . Bipolar disorder Sister   . Narcolepsy Brother   . Heart attack Maternal Grandmother   . Pancreatic cancer Maternal Grandfather   . Breast cancer Maternal Aunt     Social History:  reports that she has never smoked. She has never used smokeless tobacco. She reports that she does not drink alcohol and does not use drugs.  ROS:                                        Physical Exam: BP (!) 152/95   Pulse 76    Laboratory Data: Lab Results  Component Value Date   WBC 7.7 05/23/2020   HGB 12.2 05/23/2020   HCT 36.7 05/23/2020   MCV 90 05/23/2020   PLT 301 05/23/2020    Lab Results  Component Value Date   CREATININE 0.96 02/07/2020    No results found for: PSA  No results found for: TESTOSTERONE  No results found for: HGBA1C  Urinalysis    Component Value Date/Time   APPEARANCEUR Clear 07/08/2020 1111   GLUCOSEU Negative 07/08/2020 1111   BILIRUBINUR Negative 07/08/2020 1111   PROTEINUR Negative 07/08/2020 1111   NITRITE Negative 07/08/2020 1111   LEUKOCYTESUR Negative 07/08/2020 1111    Pertinent Imaging:   Assessment & Plan: Reassess in 4 months.  Based upon neurogenic bladder I think we should have reasonable goals.  I may mention InterStim or PTNS next visit.  Overall she was pleased  There are no diagnoses linked to this encounter.  No follow-ups on file.  Martina Sinner, MD  [facility] [address] [place], [place] [address] [phone]

## 2020-11-11 ENCOUNTER — Telehealth: Payer: Self-pay | Admitting: *Deleted

## 2020-11-11 ENCOUNTER — Encounter: Payer: Self-pay | Admitting: Neurology

## 2020-11-11 ENCOUNTER — Ambulatory Visit: Payer: Medicare Other | Admitting: Neurology

## 2020-11-11 VITALS — BP 140/86 | HR 73 | Ht 65.0 in | Wt 207.5 lb

## 2020-11-11 DIAGNOSIS — R262 Difficulty in walking, not elsewhere classified: Secondary | ICD-10-CM

## 2020-11-11 DIAGNOSIS — R29898 Other symptoms and signs involving the musculoskeletal system: Secondary | ICD-10-CM | POA: Diagnosis not present

## 2020-11-11 DIAGNOSIS — Z79899 Other long term (current) drug therapy: Secondary | ICD-10-CM

## 2020-11-11 DIAGNOSIS — N3941 Urge incontinence: Secondary | ICD-10-CM

## 2020-11-11 DIAGNOSIS — G35 Multiple sclerosis: Secondary | ICD-10-CM | POA: Diagnosis not present

## 2020-11-11 DIAGNOSIS — G43009 Migraine without aura, not intractable, without status migrainosus: Secondary | ICD-10-CM | POA: Insufficient documentation

## 2020-11-11 DIAGNOSIS — R5383 Other fatigue: Secondary | ICD-10-CM

## 2020-11-11 MED ORDER — TOPIRAMATE 50 MG PO TABS
50.0000 mg | ORAL_TABLET | Freq: Two times a day (BID) | ORAL | 3 refills | Status: DC
Start: 2020-11-11 — End: 2022-01-19

## 2020-11-11 MED ORDER — RIZATRIPTAN BENZOATE 10 MG PO TBDP: 10.0000 mg | ORAL_TABLET | ORAL | 11 refills | Status: DC | PRN

## 2020-11-11 NOTE — Telephone Encounter (Signed)
Placed JCV lab in [facility] lock box for routine lab pick up. Results pending. 

## 2020-11-11 NOTE — Progress Notes (Signed)
[facility]  PATIENT: Rose Clements DOB: 1961/02/06  REFERRING DOCTOR OR PCP:  Beverely Risen, MD SOURCE: Patient, notes from primary care and neurology, MRI and laboratory reports, MRI images personally reviewed.  _________________________________   HISTORICAL  CHIEF COMPLAINT:  Chief Complaint  Patient presents with  . Follow-up    RM 12, alone.  Last seen 05/23/20.   . Multiple Sclerosis    On Tysabri. Last: 10/14/20. Next: 11/11/20. Last JCV 05/23/20 negative, index: 0.15. NEEDS LABS TODAY.  . Gait Problem    Ambulating with rolling walker. Has had 4-5 falls since last seen. Denies hitting head or any sig. injuries.    Rose Clements is a 59 y.o. woman with relapsing multiple sclerosis.    Update 12/56/2021: She is on Tysabri.    JCV Ab was negative (0.5 05/23/2020).   We will check again today.    She uses a walker and feels gait is stable.   She has had a couple falls but no injuries.    Her right leg will give out more.  The left leg has more swelling.   She has some right hand weakness and reduced coordination as well.   She has seen urology and has been started on Myrbetriq for urinary urgency.    She has a baclofen pump.   She was switched to higher concentration so only needs a refill twice a year now.   She still takes baclofen pills as needed when she notes more tightness in her legs.    She has fatigue.  She tried Modafinil and felt it dried her out and did not help much.   She sleeps poorly at night.     She gets headaches some nights.   She takes topiramate but is not sure it hels her headache much.  She has a headache about 1/week.   There are migrainous features with nausea, photophobia/phonopobia.   She takes Tylenol when one occurs which usually helps.   She has never tried a triptan though we prescribed Maxalt.    She had an U/S thyroid and has two colloid cysts.    She reports labs are stable   She has fatigue and slepeiness.     Modafinil help  some.    She does not sleep well.   She snores but does not have OSA.    She reports PSG with Dr. Freda Munro in [place] showed narcolepsy.  She does not described cataplexy symptoms     She has vivid dreams and occasional hypnagogic hallucinations and sleep paralysis.    MS HIstory:  She  was diagnosed with relapsing remitting MS in 2011 after she presented with stiffness in her left arm and a clumsy gait with some falls.   In retrospect some of the leg symptoms started a little earlier.    She had MRI's and LP with lesions and CSF findings consistent with MS.    She was placed on Avonex.   She did ok initially but then had an exacerbation with more issues with her legs.   She was started on Tysabri about 2016 after these issues.   She has done well with Tysabri and has not had any exacerbations.   Initially, she was diagnosed and treated in [place].  She moved to [facility]) in 2016 and has seen Dr. Ronalee Red at [facility] since that time.  She transferred care to [place] mid 2021.    IMAGING: MRI of the cervical spine 03/07/2018 and 05/15/2018  and 01/19/2020 all show a couple T2 hyperintense lesions within the spinal cord.  One to the left adjacent to C2-C3 and 1 to the right adjacent to C4.    Also has right thyroid cyst.   MRI of the brain 05/15/2018 and 03/07/2018 and 2/212/2021 show multiple T2/flair hyperintense foci.  Somewhat nonspecific but a few are periventricular and juxtacortical.  There is no change between the 3 studies.     MRI of the thoracic spine 05/16/2015 did not show any definite lesions.   Hemangiomas within the T2 and T6 vertebral body.     REVIEW OF SYSTEMS: Constitutional: No fevers, chills, sweats, or change in appetite Eyes: No visual changes, double vision, eye pain Ear, nose and throat: No hearing loss, ear pain, nasal congestion, sore throat Cardiovascular: No chest pain, palpitations Respiratory: No shortness of breath at rest or with exertion.   No  wheezes GastrointestinaI: No nausea, vomiting, diarrhea, abdominal pain, fecal incontinence Genitourinary: No dysuria, urinary retention or frequency.  No nocturia. Musculoskeletal: No neck pain, back pain Integumentary: No rash, pruritus, skin lesions Neurological: as above Psychiatric: No depression at this time.  No anxiety Endocrine: No palpitations, diaphoresis, change in appetite, change in weigh or increased thirst Hematologic/Lymphatic: No anemia, purpura, petechiae. Allergic/Immunologic: No itchy/runny eyes, nasal congestion, recent allergic reactions, rashes  ALLERGIES: Allergies  Allergen Reactions  . Lisinopril Hives    HOME MEDICATIONS:  Current Outpatient Medications:  .  acetaminophen (TYLENOL) 500 MG tablet, Take 500 mg by mouth every 6 (six) hours as needed (for pain.)., Disp: , Rfl:  .  baclofen (LIORESAL) 10 MG tablet, Take 10 mg by mouth See admin instructions. Take 1 tablet (10 mg) by mouth scheduled in the morning & evening, may take an additional dose at bedtime if needed for spasms., Disp: , Rfl:  .  cetirizine (ZYRTEC) 10 MG tablet, Take 10 mg by mouth daily., Disp: , Rfl:  .  Cholecalciferol (VITAMIN D3) 1000 UNITS CAPS, Take 1,000 Units by mouth in the morning and at bedtime. , Disp: , Rfl:  .  DULoxetine (CYMBALTA) 30 MG capsule, Take 3 capsules (90 mg total) by mouth daily. TAKE 3 CAPSULES ONCE DAILY, Disp: 270 capsule, Rfl: 1 .  fluticasone (FLONASE) 50 MCG/ACT nasal spray, Place 2 sprays into both nostrils daily., Disp: 16 g, Rfl: 2 .  mirabegron ER (MYRBETRIQ) 50 MG TB24 tablet, Take 1 tablet (50 mg total) by mouth daily., Disp: 30 tablet, Rfl: 11 .  natalizumab (TYSABRI) 300 MG/15ML injection, Inject 300 mg into the vein every 30 (thirty) days. , Disp: , Rfl:  .  pantoprazole (PROTONIX) 40 MG tablet, TAKE 1 TABLET BY MOUTH EVERY DAY, Disp: 90 tablet, Rfl: 1 .  Polyethylene Glycol POWD, Take 17 g by mouth daily. (Patient taking differently: Take 17 g by  mouth daily as needed (constipation.). ), Disp: 527 g, Rfl: 12 .  rizatriptan (MAXALT-MLT) 10 MG disintegrating tablet, Take 1 tablet (10 mg total) by mouth as needed for migraine. May repeat in 2 hours if needed, Disp: 9 tablet, Rfl: 11 .  topiramate (TOPAMAX) 50 MG tablet, Take 1 tablet (50 mg total) by mouth 2 (two) times daily., Disp: 180 tablet, Rfl: 3  PAST MEDICAL HISTORY: Past Medical History:  Diagnosis Date  . Constipation   . Cystitis bacillary, chronic   . Depression   . Dysphagia    Dx by Beverely Risen on 10/15/14  . Eosinophilic esophagitis 11/19/2014   GE junction benign stricture s/p balloon dilation,  retained food contents. ?gastroparesis versus secondary to EE.  Marland Kitchen Esophageal dysphagia   . GERD (gastroesophageal reflux disease)   . Heartburn   . Hypertension   . Migraine headache    "couple times each month"  . Multilevel degenerative disc disease   . Multiple sclerosis (HCC) 07/16/2015  . Port-A-Cath in place    right side  . Presence of intrathecal baclofen pump    right abdomen  . Vertigo    Rare - none in last 6 months    PAST SURGICAL HISTORY: Past Surgical History:  Procedure Laterality Date  . ABDOMINAL HYSTERECTOMY  2003  . baclofen pump insertion Right 01/04/2018  . CARPAL TUNNEL RELEASE Right 1997  . CESAREAN SECTION  1989  . COLONOSCOPY WITH PROPOFOL N/A 10/28/2020   Procedure: COLONOSCOPY WITH PROPOFOL;  Surgeon: [name], MD;  Location: [facility];  Service: Endoscopy;  Laterality: N/A;  PRIORITY 4 port a cath  . CYSTECTOMY  2002   From chest  . ESOPHAGOGASTRODUODENOSCOPY (EGD) WITH PROPOFOL N/A 05/19/2016   Procedure: ESOPHAGOGASTRODUODENOSCOPY (EGD) WITH PROPOFOL;  Surgeon: [name], MD;  Location: [facility];  Service: Endoscopy;  Laterality: N/A;  . IR IMAGING GUIDED PORT INSERTION  06/24/2020  . KIDNEY DONATION Left 2002  . OVARIAN CYST REMOVAL Left 2002   Ovarian mass removal  . TONSILLECTOMY  1973  . UPPER  GASTROINTESTINAL ENDOSCOPY  11/19/2014   Benign appearing esophageal stricture-Dilated.    FAMILY HISTORY: Family History  Problem Relation Age of Onset  . Diabetes Mother   . Hypertension Mother   . Heart disease Mother   . Pulmonary embolism Mother   . Diabetes Father   . Diabetes Sister   . Hypertension Sister   . Heart disease Sister   . Bipolar disorder Sister   . Narcolepsy Brother   . Heart attack Maternal Grandmother   . Pancreatic cancer Maternal Grandfather   . Breast cancer Maternal Aunt     SOCIAL HISTORY:  Social History   Socioeconomic History  . Marital status: Married    Spouse name: [name]  . Number of children: 1  . Years of education: Not on file  . Highest education level: Not on file  Occupational History  . Occupation: Disability  Tobacco Use  . Smoking status: Never Smoker  . Smokeless tobacco: Never Used  Vaping Use  . Vaping Use: Never used  Substance and Sexual Activity  . Alcohol use: No    Alcohol/week: 0.0 standard drinks    Comment: rare wine on Holidays  . Drug use: No  . Sexual activity: Not Currently  Other Topics Concern  . Not on file  Social History Narrative   Lives with husband, Sundie Fiqueroa   Right handed    Caffeine use: tea sometimes (mostly herbal)   Social Determinants of Health   Financial Resource Strain:   . Difficulty of Paying Living Expenses: Not on file  Food Insecurity:   . Worried About Programme researcher, broadcasting/film/video in the Last Year: Not on file  . Ran Out of Food in the Last Year: Not on file  Transportation Needs:   . Lack of Transportation (Medical): Not on file  . Lack of Transportation (Non-Medical): Not on file  Physical Activity:   . Days of Exercise per Week: Not on file  . Minutes of Exercise per Session: Not on file  Stress:   . Feeling of Stress : Not on file  Social Connections:   . Frequency of Communication  with Friends and Family: Not on file  . Frequency of Social Gatherings with Friends and  Family: Not on file  . Attends Religious Services: Not on file  . Active Member of Clubs or Organizations: Not on file  . Attends Banker Meetings: Not on file  . Marital Status: Not on file  Intimate Partner Violence:   . Fear of Current or Ex-Partner: Not on file  . Emotionally Abused: Not on file  . Physically Abused: Not on file  . Sexually Abused: Not on file     PHYSICAL EXAM  Vitals:   11/11/20 0953  BP: 140/86  Pulse: 73  Weight: 207 lb 8 oz (94.1 kg)  Height: 5\' 5"  (1.651 m)    Body mass index is 34.53 kg/m.   General: The patient is well-developed and well-nourished and in no acute distress  Skin: Extremities are without rash or edema.   Neurologic Exam  Mental status: The patient is alert and oriented x 3 at the time of the examination. The patient has apparent normal recent and remote memory, with an apparently normal attention span and concentration ability.   Speech is normal.  Cranial nerves: Extraocular movements are full.  Facial strength was normal.  No obvious hearing deficits are noted.  Motor:  Muscle bulk is normal.   She has increased muscle tone in legs.  Muscle tone is increased, left greater than right leg.  SReduce drapid  altering movements in the left arm and strength was 4/5 in the triceps and 5/5 elsewhere.  Strength was 4 to 4+/5 in the right leg (worse)and 4/5 in the left leg.  Sensory: Sensory testing is intact to pinprick, soft touch and vibration sensation in all 4 extremities.  Coordination: Cerebellar testing reveals good finger-nose-finger and slightly reduced heel-to-shin bilaterally.  Gait and station: Station is normal.   Her gait is spastic and wide.  She is unable to tandem walk.  Romberg is negative.  Reflexes: Deep tendon reflexes are increased in the legs, left greater than right and there are crossed abductor reflexes.  Slight asymmetry in the arms.     DIAGNOSTIC DATA (LABS, IMAGING, TESTING) - I reviewed  patient records, labs, notes, testing and imaging myself where available.  Lab Results  Component Value Date   WBC 7.7 05/23/2020   HGB 12.2 05/23/2020   HCT 36.7 05/23/2020   MCV 90 05/23/2020   PLT 301 05/23/2020      Component Value Date/Time   NA 143 02/07/2020 1050   K 4.6 02/07/2020 1050   CL 107 (H) 02/07/2020 1050   CO2 20 02/07/2020 1050   GLUCOSE 87 02/07/2020 1050   GLUCOSE 101 (H) 02/15/2019 1500   BUN 9 02/07/2020 1050   CREATININE 0.96 02/07/2020 1050   CALCIUM 9.4 02/07/2020 1050   PROT 6.8 02/07/2020 1050   ALBUMIN 4.4 02/07/2020 1050   AST 14 02/07/2020 1050   ALT 5 02/07/2020 1050   ALKPHOS 88 02/07/2020 1050   BILITOT 0.5 02/07/2020 1050   GFRNONAA 66 02/07/2020 1050   GFRAA 76 02/07/2020 1050   Lab Results  Component Value Date   CHOL 222 (H) 02/07/2020   HDL 66 02/07/2020   LDLCALC 144 (H) 02/07/2020   TRIG 71 02/07/2020   No results found for: HGBA1C No results found for: VITAMINB12 Lab Results  Component Value Date   TSH 1.350 02/07/2020       ASSESSMENT AND PLAN  Multiple sclerosis (HCC) - Plan: Stratify JCV Antibody  Test ([facility]), CBC with Differential/Platelet  High risk medication use - Plan: Stratify JCV Antibody Test ([facility]), CBC with Differential/Platelet  Difficulty in walking  Urge incontinence  Weakness of both lower extremities  Migraine without aura and without status migrainosus, not intractable  Other fatigue  1.   Continue Tysabri infusions.  We will check the JCV antibody and CBC with differential today.  She now has a central line.   2.    Continue topiramate.   I sent in a prescription for maxalt prn.   If it is not tolerated or does not help, try Nurtec or Ubrelvy if needed. 3.   Stay active and exercise as tolerated. 4.    Continue Myrbetriq and follow-up with urology.   5.   She has a baclofen pump filled at [facility].  She reports no recent changes 6.   Return for follow-up in 6 months  or call sooner if new or  worsening neurologic symptoms.     Deniz Eskridge A. Epimenio Foot, MD, [facility] 11/11/2020, 10:41 AM Certified in Neurology, Clinical Neurophysiology, Sleep Medicine and Neuroimaging  [facility] [address], [address] [place], [place] [address] [phone]

## 2020-11-12 LAB — CBC WITH DIFFERENTIAL/PLATELET
Basophils Absolute: 0.1 10*3/uL (ref 0.0–0.2)
Basos: 1 %
EOS (ABSOLUTE): 0.1 10*3/uL (ref 0.0–0.4)
Eos: 2 %
Hematocrit: 35.1 % (ref 34.0–46.6)
Hemoglobin: 11.2 g/dL (ref 11.1–15.9)
Immature Grans (Abs): 0 10*3/uL (ref 0.0–0.1)
Immature Granulocytes: 0 %
Lymphocytes Absolute: 3.9 10*3/uL — ABNORMAL HIGH (ref 0.7–3.1)
Lymphs: 58 %
MCH: 29.6 pg (ref 26.6–33.0)
MCHC: 31.9 g/dL (ref 31.5–35.7)
MCV: 93 fL (ref 79–97)
Monocytes Absolute: 0.4 10*3/uL (ref 0.1–0.9)
Monocytes: 6 %
Neutrophils Absolute: 2.2 10*3/uL (ref 1.4–7.0)
Neutrophils: 33 %
Platelets: 262 10*3/uL (ref 150–450)
RBC: 3.78 x10E6/uL (ref 3.77–5.28)
RDW: 12.5 % (ref 11.7–15.4)
WBC: 6.8 10*3/uL (ref 3.4–10.8)

## 2020-11-15 IMAGING — MG DIGITAL SCREENING BILAT W/ TOMO W/ CAD
8 series · 8 of 24 positions shown · non-contrast
Comparison: Previous exam(s).

CLINICAL DATA: Screening.

EXAM:
DIGITAL SCREENING BILATERAL MAMMOGRAM WITH TOMO AND CAD

[R CC synth-2D]
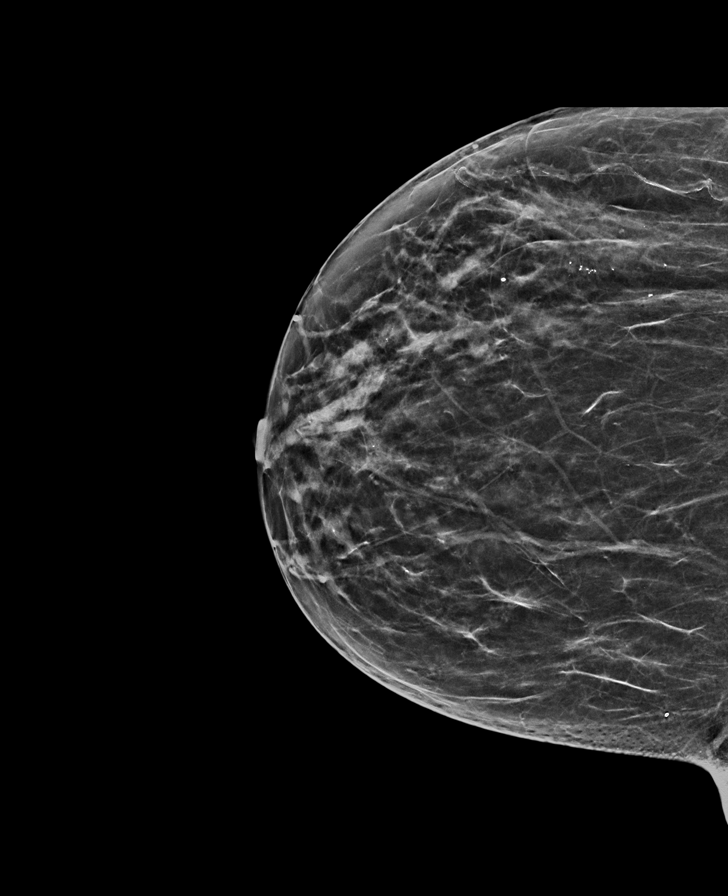

[L CC synth-2D]
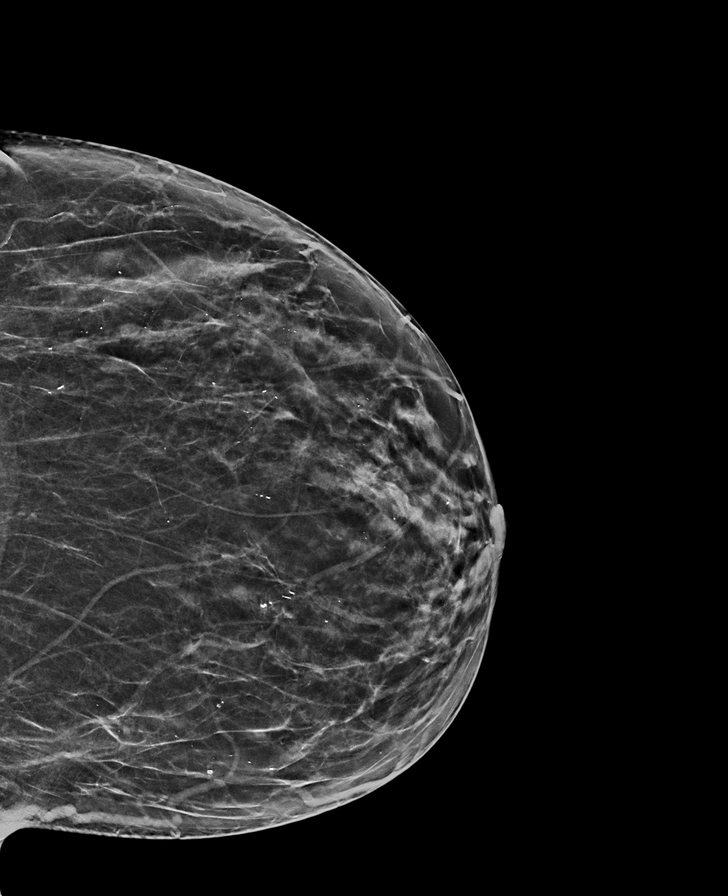

[R MLO synth-2D]
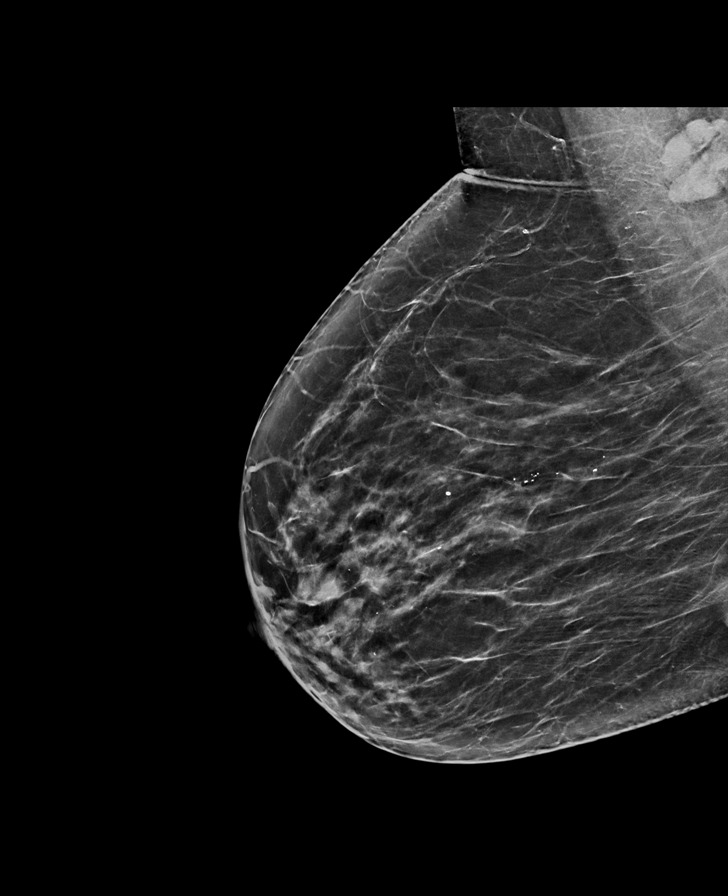

[L MLO synth-2D]
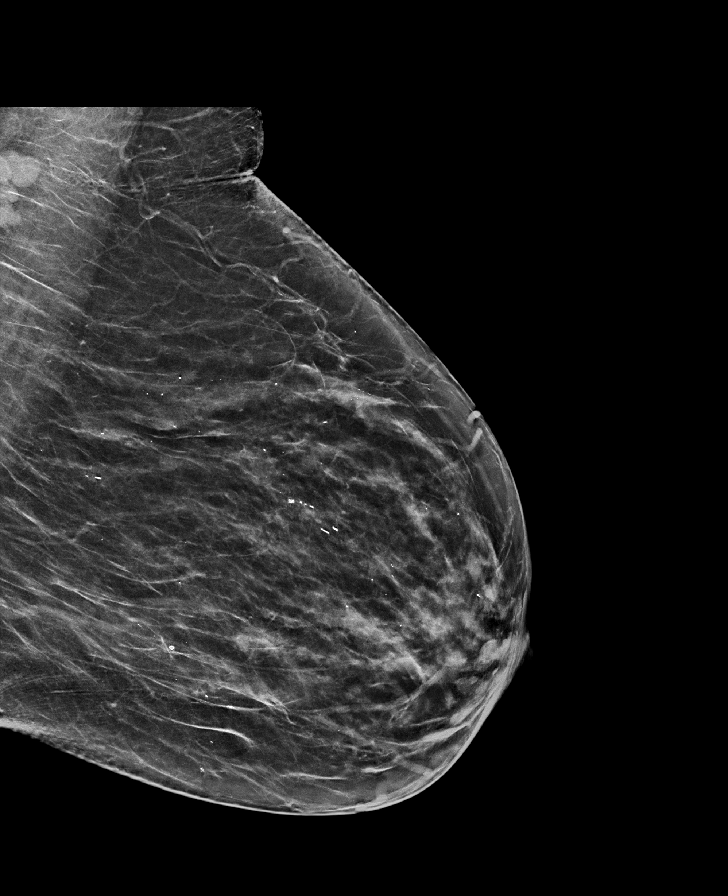

[L MLO tomo · tomo slice 37/72.0]
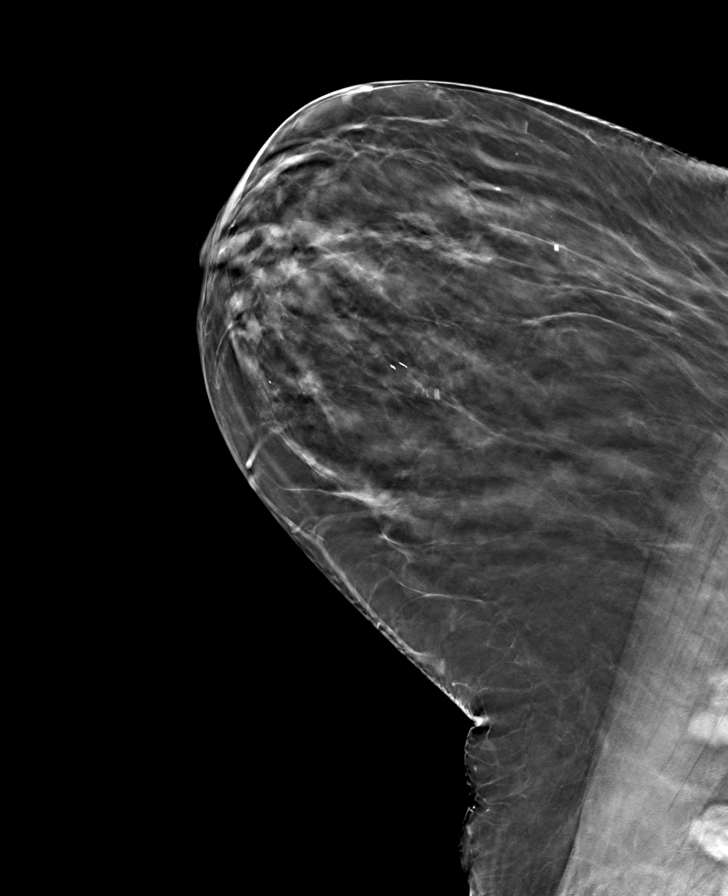

[R MLO tomo · tomo slice 37/73.0]
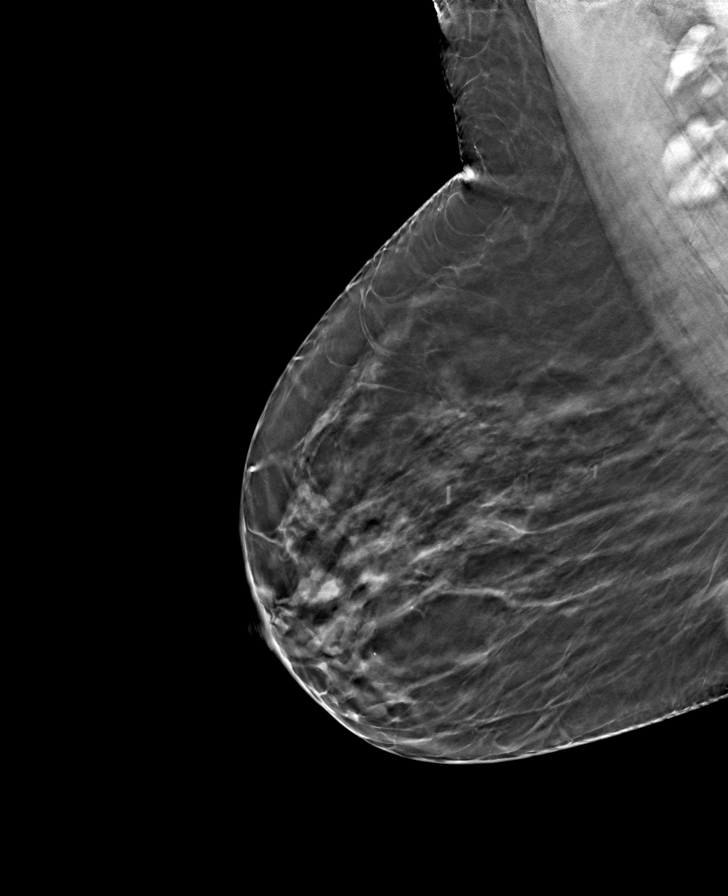

[L CC tomo · tomo slice 33/64.0]
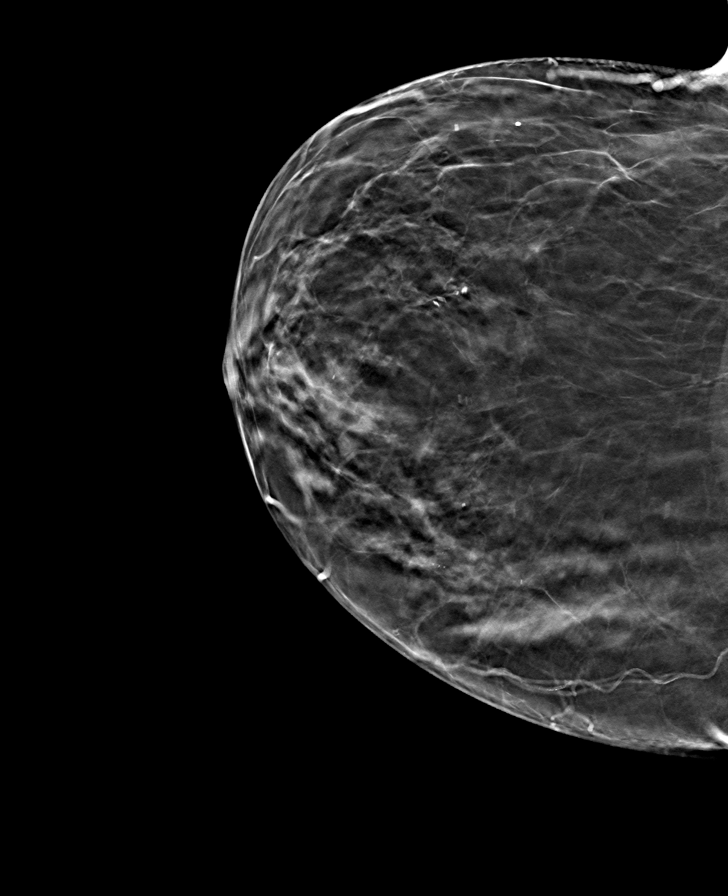

[R CC tomo · tomo slice 31/62.0]
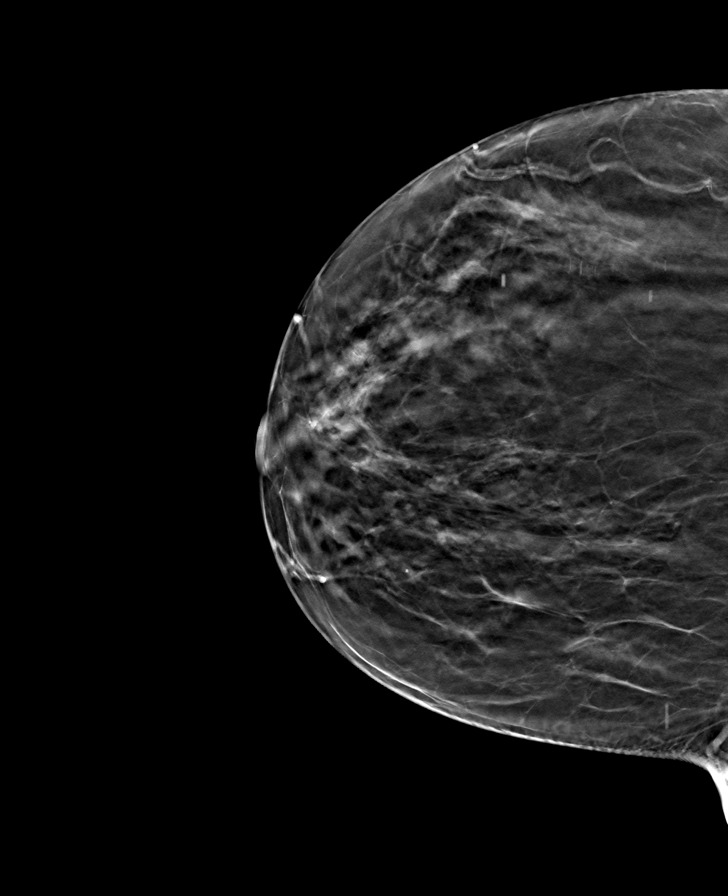

[8 of 24 positions shown; findings below may reference images not displayed]

ACR Breast Density Category c: The breast tissue is heterogeneously
dense, which may obscure small masses.
FINDINGS: There are no findings suspicious for malignancy. Images were
processed with CAD.
IMPRESSION: No mammographic evidence of malignancy. A result letter of this
screening mammogram will be mailed directly to the patient.

RECOMMENDATION:
Screening mammogram in one year. (Code:[id number]-U-LHB)

BI-RADS CATEGORY  1: Negative.

## 2020-11-18 NOTE — Telephone Encounter (Signed)
JCV ab drawn on 11/11/20 negative, index: 0.15

## 2020-11-23 DIAGNOSIS — E042 Nontoxic multinodular goiter: Secondary | ICD-10-CM | POA: Insufficient documentation

## 2020-12-11 DIAGNOSIS — G35 Multiple sclerosis: Secondary | ICD-10-CM | POA: Diagnosis not present

## 2020-12-16 ENCOUNTER — Other Ambulatory Visit: Payer: Self-pay | Admitting: Hospice and Palliative Medicine

## 2020-12-16 DIAGNOSIS — T7840XS Allergy, unspecified, sequela: Secondary | ICD-10-CM

## 2020-12-18 ENCOUNTER — Telehealth: Payer: Self-pay | Admitting: *Deleted

## 2020-12-18 ENCOUNTER — Other Ambulatory Visit: Payer: Self-pay | Admitting: Neurology

## 2020-12-18 NOTE — Telephone Encounter (Signed)
Faxed completed/signed Tysabri pt status report and reauth questionnaire to [facility] at [phone]. Received confirmation.   Received fax from touch prescribing program that pt re-auth for Tysabri from 12/18/20-07/19/21. Pt enrollment number: [id number]. Account: [facility]. Site auth number: [id number]

## 2020-12-24 ENCOUNTER — Ambulatory Visit: Payer: Medicare Other | Admitting: Hospice and Palliative Medicine

## 2021-01-02 ENCOUNTER — Encounter: Payer: Self-pay | Admitting: Hospice and Palliative Medicine

## 2021-01-02 ENCOUNTER — Ambulatory Visit (INDEPENDENT_AMBULATORY_CARE_PROVIDER_SITE_OTHER): Payer: Medicare Other | Admitting: Hospice and Palliative Medicine

## 2021-01-02 VITALS — BP 140/84 | HR 88 | Temp 97.6°F | Resp 16 | Ht 65.0 in | Wt 196.0 lb

## 2021-01-02 DIAGNOSIS — N3946 Mixed incontinence: Secondary | ICD-10-CM

## 2021-01-02 DIAGNOSIS — F331 Major depressive disorder, recurrent, moderate: Secondary | ICD-10-CM | POA: Diagnosis not present

## 2021-01-02 DIAGNOSIS — G35 Multiple sclerosis: Secondary | ICD-10-CM

## 2021-01-02 NOTE — Progress Notes (Signed)
[facility] [address] [place], [place] [address]  Internal MEDICINE  Office Visit Note  Patient Name: Rose Clements  [id number]  [id number]  Date of Service: 01/04/2021  Chief Complaint  Patient presents with  . Follow-up  . Depression  . Gastroesophageal Reflux  . Hypertension    HPI Patient is here for routine follow-up Followed by neurology for MS--has Baclofen pump, also currently being treated with Tysabri IV monthly History of mixed incontinence-followed by urology, recently started on Myrbetriq--somewhat beneficial at controlling incontinence She reports she has been recently struggling with depression, has many sad days Has been on Cymbalta for many years and feels it is no longer working at controlling her depression  Current Medication: Outpatient Encounter Medications as of 01/02/2021  Medication Sig Note  . sertraline (ZOLOFT) 50 MG tablet Take 1 tablet (50 mg total) by mouth daily.   Marland Kitchen acetaminophen (TYLENOL) 500 MG tablet Take 500 mg by mouth every 6 (six) hours as needed (for pain.).   Marland Kitchen baclofen (LIORESAL) 10 MG tablet Take 10 mg by mouth See admin instructions. Take 1 tablet (10 mg) by mouth scheduled in the morning & evening, may take an additional dose at bedtime if needed for spasms.   . cetirizine (ZYRTEC) 10 MG tablet Take 10 mg by mouth daily.   . Cholecalciferol (VITAMIN D3) 1000 UNITS CAPS Take 1,000 Units by mouth in the morning and at bedtime.    . DULoxetine (CYMBALTA) 30 MG capsule Take 3 capsules (90 mg total) by mouth daily. TAKE 3 CAPSULES ONCE DAILY   . fluticasone (FLONASE) 50 MCG/ACT nasal spray SPRAY 2 SPRAYS INTO EACH NOSTRIL EVERY DAY   . mirabegron ER (MYRBETRIQ) 50 MG TB24 tablet Take 1 tablet (50 mg total) by mouth daily.   . natalizumab (TYSABRI) 300 MG/15ML injection Inject 300 mg into the vein every 30 (thirty) days.  11/18/2020: JCV ab drawn on 11/11/20 negative, index: 0.15   . pantoprazole (PROTONIX) 40 MG tablet TAKE 1  TABLET BY MOUTH EVERY DAY   . Polyethylene Glycol POWD Take 17 g by mouth daily. (Patient taking differently: Take 17 g by mouth daily as needed (constipation.). )   . rizatriptan (MAXALT-MLT) 10 MG disintegrating tablet Take 1 tablet (10 mg total) by mouth as needed for migraine. May repeat in 2 hours if needed   . topiramate (TOPAMAX) 50 MG tablet Take 1 tablet (50 mg total) by mouth 2 (two) times daily.    No facility-administered encounter medications on file as of 01/02/2021.    Surgical History: Past Surgical History:  Procedure Laterality Date  . ABDOMINAL HYSTERECTOMY  2003  . baclofen pump insertion Right 01/04/2018  . CARPAL TUNNEL RELEASE Right 1997  . CESAREAN SECTION  1989  . COLONOSCOPY WITH PROPOFOL N/A 10/28/2020   Procedure: COLONOSCOPY WITH PROPOFOL;  Surgeon: [name], MD;  Location: [facility];  Service: Endoscopy;  Laterality: N/A;  PRIORITY 4 port a cath  . CYSTECTOMY  2002   From chest  . ESOPHAGOGASTRODUODENOSCOPY (EGD) WITH PROPOFOL N/A 05/19/2016   Procedure: ESOPHAGOGASTRODUODENOSCOPY (EGD) WITH PROPOFOL;  Surgeon: [name], MD;  Location: [facility];  Service: Endoscopy;  Laterality: N/A;  . IR IMAGING GUIDED PORT INSERTION  06/24/2020  . KIDNEY DONATION Left 2002  . OVARIAN CYST REMOVAL Left 2002   Ovarian mass removal  . TONSILLECTOMY  1973  . UPPER GASTROINTESTINAL ENDOSCOPY  11/19/2014   Benign appearing esophageal stricture-Dilated.    Medical History: Past Medical History:  Diagnosis  Date  . Constipation   . Cystitis bacillary, chronic   . Depression   . Dysphagia    Dx by Beverely Risen on 10/15/14  . Eosinophilic esophagitis 11/19/2014   GE junction benign stricture s/p balloon dilation, retained food contents. ?gastroparesis versus secondary to EE.  Marland Kitchen Esophageal dysphagia   . GERD (gastroesophageal reflux disease)   . Heartburn   . Hypertension   . Migraine headache    "couple times each month"  . Multilevel degenerative  disc disease   . Multiple sclerosis (HCC) 07/16/2015  . Port-A-Cath in place    right side  . Presence of intrathecal baclofen pump    right abdomen  . Vertigo    Rare - none in last 6 months    Family History: Family History  Problem Relation Age of Onset  . Diabetes Mother   . Hypertension Mother   . Heart disease Mother   . Pulmonary embolism Mother   . Diabetes Father   . Diabetes Sister   . Hypertension Sister   . Heart disease Sister   . Bipolar disorder Sister   . Narcolepsy Brother   . Heart attack Maternal Grandmother   . Pancreatic cancer Maternal Grandfather   . Breast cancer Maternal Aunt     Social History   Socioeconomic History  . Marital status: Married    Spouse name: [name]  . Number of children: 1  . Years of education: Not on file  . Highest education level: Not on file  Occupational History  . Occupation: Disability  Tobacco Use  . Smoking status: Never Smoker  . Smokeless tobacco: Never Used  Vaping Use  . Vaping Use: Never used  Substance and Sexual Activity  . Alcohol use: No    Alcohol/week: 0.0 standard drinks    Comment: rare wine on Holidays  . Drug use: No  . Sexual activity: Not Currently  Other Topics Concern  . Not on file  Social History Narrative   Lives with husband, Nataya Bastedo   Right handed    Caffeine use: tea sometimes (mostly herbal)   Social Determinants of Health   Financial Resource Strain: Not on file  Food Insecurity: Not on file  Transportation Needs: Not on file  Physical Activity: Not on file  Stress: Not on file  Social Connections: Not on file  Intimate Partner Violence: Not on file      Review of Systems  Constitutional: Positive for fatigue. Negative for chills and diaphoresis.  HENT: Negative for ear pain, postnasal drip and sinus pressure.   Eyes: Negative for photophobia, discharge, redness, itching and visual disturbance.  Respiratory: Negative for cough, shortness of breath and wheezing.    Cardiovascular: Negative for chest pain, palpitations and leg swelling.  Gastrointestinal: Negative for abdominal pain, constipation, diarrhea, nausea and vomiting.  Genitourinary: Negative for dysuria and flank pain.       Incontinence  Musculoskeletal: Positive for arthralgias. Negative for back pain, gait problem and neck pain.  Skin: Negative for color change.  Allergic/Immunologic: Negative for environmental allergies and food allergies.  Neurological: Positive for weakness. Negative for dizziness and headaches.  Hematological: Does not bruise/bleed easily.  Psychiatric/Behavioral: Positive for behavioral problems (depression). Negative for agitation and hallucinations.    Vital Signs: BP 140/84   Pulse 88   Temp 97.6 F (36.4 C)   Resp 16   Ht 5\' 5"  (1.651 m)   Wt 196 lb (88.9 kg)   SpO2 97%   BMI 32.62 kg/m  Physical Exam Vitals reviewed.  Constitutional:      Appearance: Normal appearance. She is normal weight.  Cardiovascular:     Rate and Rhythm: Normal rate and regular rhythm.     Pulses: Normal pulses.     Heart sounds: Normal heart sounds.  Pulmonary:     Effort: Pulmonary effort is normal.     Breath sounds: Normal breath sounds.  Abdominal:     General: Abdomen is flat.  Musculoskeletal:        General: Normal range of motion.     Cervical back: Normal range of motion.  Skin:    General: Skin is warm.  Neurological:     General: No focal deficit present.     Mental Status: She is alert and oriented to person, place, and time. Mental status is at baseline.  Psychiatric:        Mood and Affect: Mood normal.        Behavior: Behavior normal.        Thought Content: Thought content normal.        Judgment: Judgment normal.    Assessment/Plan: 1. Multiple sclerosis (HCC) Remains at baseline Followed and managed by neurology  2. Major depressive disorder, recurrent episode, moderate (HCC) Instructed to slowly wean herself off of Cymbalta while  starting low dose Zoloft--may require increase in Zoloft once weaned off Cymbalta - sertraline (ZOLOFT) 50 MG tablet; Take 1 tablet (50 mg total) by mouth daily.  Dispense: 30 tablet; Refill: 3  3. Mixed stress and urge urinary incontinence Continue with Myrbetriq at this time and follow-up with urology as planned  General Counseling: Calena verbalizes understanding of the findings of todays visit and agrees with plan of treatment. I have discussed any further diagnostic evaluation that may be needed or ordered today. We also reviewed her medications today. she has been encouraged to call the office with any questions or concerns that should arise related to todays visit.   Meds ordered this encounter  Medications  . sertraline (ZOLOFT) 50 MG tablet    Sig: Take 1 tablet (50 mg total) by mouth daily.    Dispense:  30 tablet    Refill:  3    Time spent: 30 Minutes Time spent includes review of chart, medications, test results and follow-up plan with the patient.  This patient was seen by Leeanne Deed AGNP-C in Collaboration with Dr Lyndon Code as a part of collaborative care agreement     Lubertha Basque. Shandrell Boda AGNP-C Internal medicine

## 2021-01-04 ENCOUNTER — Encounter: Payer: Self-pay | Admitting: Hospice and Palliative Medicine

## 2021-01-04 MED ORDER — SERTRALINE HCL 50 MG PO TABS: 50.0000 mg | ORAL_TABLET | Freq: Every day | ORAL | 3 refills | Status: DC

## 2021-01-08 DIAGNOSIS — G35 Multiple sclerosis: Secondary | ICD-10-CM | POA: Diagnosis not present

## 2021-02-03 ENCOUNTER — Encounter: Payer: Medicare Other | Admitting: Physician Assistant

## 2021-02-05 DIAGNOSIS — G35 Multiple sclerosis: Secondary | ICD-10-CM | POA: Diagnosis not present

## 2021-03-03 ENCOUNTER — Other Ambulatory Visit: Payer: Self-pay

## 2021-03-03 ENCOUNTER — Encounter: Payer: Self-pay | Admitting: Physician Assistant

## 2021-03-03 ENCOUNTER — Ambulatory Visit (INDEPENDENT_AMBULATORY_CARE_PROVIDER_SITE_OTHER): Payer: Medicare Other | Admitting: Physician Assistant

## 2021-03-03 DIAGNOSIS — G43009 Migraine without aura, not intractable, without status migrainosus: Secondary | ICD-10-CM | POA: Diagnosis not present

## 2021-03-03 DIAGNOSIS — E782 Mixed hyperlipidemia: Secondary | ICD-10-CM

## 2021-03-03 DIAGNOSIS — F331 Major depressive disorder, recurrent, moderate: Secondary | ICD-10-CM

## 2021-03-03 DIAGNOSIS — Z1231 Encounter for screening mammogram for malignant neoplasm of breast: Secondary | ICD-10-CM

## 2021-03-03 DIAGNOSIS — G35 Multiple sclerosis: Secondary | ICD-10-CM | POA: Diagnosis not present

## 2021-03-03 DIAGNOSIS — I10 Essential (primary) hypertension: Secondary | ICD-10-CM

## 2021-03-03 DIAGNOSIS — R5383 Other fatigue: Secondary | ICD-10-CM

## 2021-03-03 DIAGNOSIS — R300 Dysuria: Secondary | ICD-10-CM

## 2021-03-03 DIAGNOSIS — Z0001 Encounter for general adult medical examination with abnormal findings: Secondary | ICD-10-CM | POA: Diagnosis not present

## 2021-03-03 DIAGNOSIS — N3946 Mixed incontinence: Secondary | ICD-10-CM

## 2021-03-03 DIAGNOSIS — Z124 Encounter for screening for malignant neoplasm of cervix: Secondary | ICD-10-CM | POA: Diagnosis not present

## 2021-03-03 MED ORDER — SERTRALINE HCL 100 MG PO TABS
100.0000 mg | ORAL_TABLET | Freq: Every day | ORAL | 3 refills | Status: DC
Start: 2021-03-03 — End: 2021-05-28

## 2021-03-03 NOTE — Progress Notes (Signed)
[facility] [address] [place], [place] [address]  Internal MEDICINE  Office Visit Note  Patient Name: Rose Clements  [id number]  [id number]  Date of Service: 03/04/2021  Chief Complaint  Patient presents with  . Medicare Wellness  . Hypertension  . Gastroesophageal Reflux  . Depression  . Ear Problem     HPI Pt is here for routine health maintenance examination -She had her colonoscopy in Nov 2021. She is due for mammogram. -She has a history of MS and migraines which are followed by neurology. She has a baclofen pump and does tysabri infusions. She is on topamax and maxalt for her migraines. She has an infusion on Wednesday. She has been fatigued and is concerned for flare starting so she will call neurologist for sooner appt than June. -She is also followed by urology for incontinence and is on Myrbetriq. -last visit had increased depression and cymbalta was no longer working, she weaned off cymbalta and started on zoloft. She is taking 50mg  of zoloft and does not feel it is doing enough and would like to increase dose. She talks to her therapist 2x per month. -She has been having increased reflux and plans to see GI.   Current Medication: Outpatient Encounter Medications as of 03/03/2021  Medication Sig Note  . acetaminophen (TYLENOL) 500 MG tablet Take 500 mg by mouth every 6 (six) hours as needed (for pain.).   03/05/2021 baclofen (LIORESAL) 10 MG tablet Take 10 mg by mouth See admin instructions. Take 1 tablet (10 mg) by mouth scheduled in the morning & evening, may take an additional dose at bedtime if needed for spasms.   . cetirizine (ZYRTEC) 10 MG tablet Take 10 mg by mouth daily.   . Cholecalciferol (VITAMIN D3) 1000 UNITS CAPS Take 1,000 Units by mouth in the morning and at bedtime.    . fluticasone (FLONASE) 50 MCG/ACT nasal spray SPRAY 2 SPRAYS INTO EACH NOSTRIL EVERY DAY   . mirabegron ER (MYRBETRIQ) 50 MG TB24 tablet Take 1 tablet (50 mg total) by mouth  daily.   . natalizumab (TYSABRI) 300 MG/15ML injection Inject 300 mg into the vein every 30 (thirty) days.  11/18/2020: JCV ab drawn on 11/11/20 negative, index: 0.15   . pantoprazole (PROTONIX) 40 MG tablet TAKE 1 TABLET BY MOUTH EVERY DAY   . Polyethylene Glycol POWD Take 17 g by mouth daily. (Patient taking differently: Take 17 g by mouth daily as needed (constipation.).)   . rizatriptan (MAXALT-MLT) 10 MG disintegrating tablet Take 1 tablet (10 mg total) by mouth as needed for migraine. May repeat in 2 hours if needed   . sertraline (ZOLOFT) 100 MG tablet Take 1 tablet (100 mg total) by mouth daily.   14/06/21 topiramate (TOPAMAX) 50 MG tablet Take 1 tablet (50 mg total) by mouth 2 (two) times daily.   . [DISCONTINUED] DULoxetine (CYMBALTA) 30 MG capsule Take 3 capsules (90 mg total) by mouth daily. TAKE 3 CAPSULES ONCE DAILY   . [DISCONTINUED] sertraline (ZOLOFT) 50 MG tablet Take 1 tablet (50 mg total) by mouth daily.    No facility-administered encounter medications on file as of 03/03/2021.    Surgical History: Past Surgical History:  Procedure Laterality Date  . ABDOMINAL HYSTERECTOMY  2003  . baclofen pump insertion Right 01/04/2018  . CARPAL TUNNEL RELEASE Right 1997  . CESAREAN SECTION  1989  . COLONOSCOPY WITH PROPOFOL N/A 10/28/2020   Procedure: COLONOSCOPY WITH PROPOFOL;  Surgeon: 10/30/2020, MD;  Location: [facility];  Service: Endoscopy;  Laterality: N/A;  PRIORITY 4 port a cath  . CYSTECTOMY  2002   From chest  . ESOPHAGOGASTRODUODENOSCOPY (EGD) WITH PROPOFOL N/A 05/19/2016   Procedure: ESOPHAGOGASTRODUODENOSCOPY (EGD) WITH PROPOFOL;  Surgeon: [name], MD;  Location: [facility];  Service: Endoscopy;  Laterality: N/A;  . IR IMAGING GUIDED PORT INSERTION  06/24/2020  . KIDNEY DONATION Left 2002  . OVARIAN CYST REMOVAL Left 2002   Ovarian mass removal  . TONSILLECTOMY  1973  . UPPER GASTROINTESTINAL ENDOSCOPY  11/19/2014   Benign appearing esophageal  stricture-Dilated.    Medical History: Past Medical History:  Diagnosis Date  . Constipation   . Cystitis bacillary, chronic   . Depression   . Dysphagia    Dx by Beverely Risen on 10/15/14  . Eosinophilic esophagitis 11/19/2014   GE junction benign stricture s/p balloon dilation, retained food contents. ?gastroparesis versus secondary to EE.  Marland Kitchen Esophageal dysphagia   . GERD (gastroesophageal reflux disease)   . Heartburn   . Hypertension   . Migraine headache    "couple times each month"  . Multilevel degenerative disc disease   . Multiple sclerosis (HCC) 07/16/2015  . Port-A-Cath in place    right side  . Presence of intrathecal baclofen pump    right abdomen  . Vertigo    Rare - none in last 6 months    Family History: Family History  Problem Relation Age of Onset  . Diabetes Mother   . Hypertension Mother   . Heart disease Mother   . Pulmonary embolism Mother   . Diabetes Father   . Diabetes Sister   . Hypertension Sister   . Heart disease Sister   . Bipolar disorder Sister   . Narcolepsy Brother   . Heart attack Maternal Grandmother   . Pancreatic cancer Maternal Grandfather   . Breast cancer Maternal Aunt       Review of Systems  Constitutional: Positive for fatigue. Negative for chills and unexpected weight change.  HENT: Negative for congestion, postnasal drip, rhinorrhea, sneezing and sore throat.   Eyes: Negative for redness.  Respiratory: Negative for cough, chest tightness and shortness of breath.   Cardiovascular: Negative for chest pain and palpitations.  Gastrointestinal: Negative for abdominal pain, constipation, diarrhea, nausea and vomiting.  Genitourinary: Negative for dysuria and frequency.  Musculoskeletal: Positive for gait problem. Negative for arthralgias, back pain, joint swelling and neck pain.  Skin: Negative for rash.  Neurological: Positive for weakness and headaches. Negative for tremors and numbness.  Hematological: Negative for  adenopathy. Does not bruise/bleed easily.  Psychiatric/Behavioral: Positive for behavioral problems (Depression). Negative for sleep disturbance and suicidal ideas. The patient is not nervous/anxious.      Vital Signs: BP 122/78   Pulse 83   Temp 98 F (36.7 C)   Resp 16   Ht 5\' 5"  (1.651 m)   Wt 194 lb (88 kg)   SpO2 98%   BMI 32.28 kg/m    Physical Exam Vitals and nursing note reviewed.  Constitutional:      General: She is not in acute distress.    Appearance: She is well-developed. She is obese. She is not diaphoretic.  HENT:     Head: Normocephalic and atraumatic.     Right Ear: External ear normal.     Left Ear: External ear normal.     Nose: Nose normal.     Mouth/Throat:     Pharynx: No oropharyngeal exudate.  Eyes:     General:  No scleral icterus.       Right eye: No discharge.        Left eye: No discharge.     Conjunctiva/sclera: Conjunctivae normal.     Pupils: Pupils are equal, round, and reactive to light.  Neck:     Thyroid: No thyromegaly.     Vascular: No JVD.     Trachea: No tracheal deviation.  Cardiovascular:     Rate and Rhythm: Normal rate and regular rhythm.     Heart sounds: Normal heart sounds. No murmur heard. No friction rub. No gallop.   Pulmonary:     Effort: Pulmonary effort is normal. No respiratory distress.     Breath sounds: Normal breath sounds. No stridor. No wheezing or rales.  Chest:     Chest wall: No tenderness.  Abdominal:     General: Bowel sounds are normal. There is no distension.     Palpations: Abdomen is soft. There is no mass.     Tenderness: There is no abdominal tenderness. There is no guarding or rebound.  Musculoskeletal:        General: No tenderness or deformity. Normal range of motion.     Cervical back: Normal range of motion and neck supple.     Right lower leg: Edema present.     Left lower leg: Edema present.  Lymphadenopathy:     Cervical: No cervical adenopathy.  Skin:    General: Skin is warm  and dry.     Coloration: Skin is not pale.     Findings: No erythema or rash.  Neurological:     Mental Status: She is alert. Mental status is at baseline.     Cranial Nerves: No cranial nerve deficit.     Motor: Weakness present. No abnormal muscle tone.     Coordination: Coordination normal.     Gait: Gait abnormal.     Deep Tendon Reflexes: Reflexes are normal and symmetric.     Comments: Pt has spasticity from MS and walks with a cane  Psychiatric:        Behavior: Behavior normal.        Thought Content: Thought content normal.        Judgment: Judgment normal.      LABS: Recent Results (from the past 2160 hour(s))  UA/M w/rflx Culture, Routine     Status: Abnormal (Preliminary result)   Collection Time: 03/03/21 10:00 AM   Specimen: Urine   Urine  Result Value Ref Range   Specific Gravity, UA 1.013 1.005 - 1.030   pH, UA 8.5 (H) 5.0 - 7.5   Color, UA Yellow Yellow   Appearance Ur Clear Clear   Leukocytes,UA 1+ (A) Negative   Protein,UA Negative Negative/Trace   Glucose, UA Negative Negative   Ketones, UA Negative Negative   RBC, UA Negative Negative   Bilirubin, UA Negative Negative   Urobilinogen, Ur 0.2 0.2 - 1.0 mg/dL   Nitrite, UA Negative Negative   Microscopic Examination See below:     Comment: Microscopic was indicated and was performed.   Urinalysis Reflex Comment     Comment: This specimen has reflexed to a Urine Culture.  Microscopic Examination     Status: None   Collection Time: 03/03/21 10:00 AM   Urine  Result Value Ref Range   WBC, UA None seen 0 - 5 /hpf   RBC None seen 0 - 2 /hpf   Epithelial Cells (non renal) 0-10 0 - 10 /hpf   Casts  None seen None seen /lpf   Bacteria, UA None seen None seen/Few  Urine Culture, Reflex     Status: None (Preliminary result)   Collection Time: 03/03/21 10:00 AM   Urine  Result Value Ref Range   Urine Culture, Routine WILL FOLLOW         Assessment/Plan: 1. Encounter for general adult medical  examination with abnormal findings Pt due for routine fasting labs and will have these done. UTD on colonoscopy, mammogram ordered  2. Multiple sclerosis (HCC) Followed by neurology, pt has baclofen pump and does infusion therapy. Pt is having increased fatigue which has been a symptom of relapse in the past and will discuss with neurologist.  3. Migraine without aura and without status migrainosus, not intractable Followed by neurology, taking topamax for prevention and has maxalt for acute therapy.  4. Major depressive disorder, recurrent episode, moderate (HCC) Will increase zoloft. Pt may take 1.5 tabs of the remaining 50mg  tabs (75mg  total) for 1 week then increase to 100mg  daily. Pt should also continue counseling. - sertraline (ZOLOFT) 100 MG tablet; Take 1 tablet (100 mg total) by mouth daily.  Dispense: 30 tablet; Refill: 3  5. Hypertension, unspecified type Stable. Continue to monitor  6. Mixed stress and urge urinary incontinence On myrbetriq, Followed by urology  7. Mixed hyperlipidemia Elevated cholesterol last visit, will repeat labs  8. Encounter for screening mammogram for malignant neoplasm of breast - MM DIGITAL SCREENING BILATERAL; Future  9. Other fatigue - Comprehensive metabolic panel - CBC w/Diff/Platelet - TSH + free T4 - Lipid Panel With LDL/HDL Ratio - Vitamin D (25 hydroxy) - B12 and Folate Panel  10. Dysuria - UA/M w/rflx Culture, Routine  General Counseling: Rose Clements verbalizes understanding of the findings of todays visit and agrees with plan of treatment. I have discussed any further diagnostic evaluation that may be needed or ordered today. We also reviewed her medications today. she has been encouraged to call the office with any questions or concerns that should arise related to todays visit.    Counseling:    Orders Placed This Encounter  Procedures  . Microscopic Examination  . Urine Culture, Reflex  . MM DIGITAL SCREENING BILATERAL   . UA/M w/rflx Culture, Routine  . Comprehensive metabolic panel  . CBC w/Diff/Platelet  . TSH + free T4  . Lipid Panel With LDL/HDL Ratio  . Vitamin D (25 hydroxy)  . B12 and Folate Panel    Meds ordered this encounter  Medications  . sertraline (ZOLOFT) 100 MG tablet    Sig: Take 1 tablet (100 mg total) by mouth daily.    Dispense:  30 tablet    Refill:  3    This patient was seen by Lynn ItoLauren Wilfred Dayrit, PA-C in collaboration with Dr. Beverely RisenFozia Khan as a part of collaborative care agreement.  Total time spent:30 Minutes  Time spent includes review of chart, medications, test results, and follow up plan with the patient.     Lyndon CodeFozia M Khan, MD  Internal Medicine

## 2021-03-05 DIAGNOSIS — G35 Multiple sclerosis: Secondary | ICD-10-CM | POA: Diagnosis not present

## 2021-03-06 LAB — UA/M W/RFLX CULTURE, ROUTINE
Bilirubin, UA: NEGATIVE
Glucose, UA: NEGATIVE
Ketones, UA: NEGATIVE
Nitrite, UA: NEGATIVE
Protein,UA: NEGATIVE
RBC, UA: NEGATIVE
Specific Gravity, UA: 1.013 (ref 1.005–1.030)
Urobilinogen, Ur: 0.2 mg/dL (ref 0.2–1.0)
pH, UA: 8.5 — ABNORMAL HIGH (ref 5.0–7.5)

## 2021-03-06 LAB — MICROSCOPIC EXAMINATION
Bacteria, UA: NONE SEEN
Casts: NONE SEEN /lpf
RBC, Urine: NONE SEEN /hpf (ref 0–2)
WBC, UA: NONE SEEN /hpf (ref 0–5)

## 2021-03-06 LAB — URINE CULTURE, REFLEX

## 2021-03-07 ENCOUNTER — Telehealth: Payer: Self-pay | Admitting: Internal Medicine

## 2021-03-07 NOTE — Progress Notes (Signed)
  Chronic Care Management   Outreach Note  03/07/2021 Name: Rose Clements MRN: [id number] DOB: 1961-09-08  Referred by: Lyndon Code, MD Reason for referral : No chief complaint on file.   An unsuccessful telephone outreach was attempted today. The patient was referred to the pharmacist for assistance with care management and care coordination.   Follow Up Plan:   Carley Perdue [employer] Scheduler

## 2021-03-10 ENCOUNTER — Encounter: Payer: Self-pay | Admitting: Urology

## 2021-03-10 ENCOUNTER — Other Ambulatory Visit: Payer: Self-pay

## 2021-03-10 ENCOUNTER — Ambulatory Visit: Payer: Medicare Other | Admitting: Urology

## 2021-03-10 VITALS — BP 117/76 | HR 64 | Ht 65.0 in | Wt 194.0 lb

## 2021-03-10 DIAGNOSIS — N3946 Mixed incontinence: Secondary | ICD-10-CM

## 2021-03-10 IMAGING — XA IR IMAGING GUIDED PORT INSERTION
1 series · 1 of 1 positions shown · non-contrast
Comparison: none

CLINICAL DATA: MULTIPLE SCLEROSIS, ACCESS FOR IV THERAPIES

[Series 1: fl (-) angio · 1 of 1 slices shown]
[im 1/1]
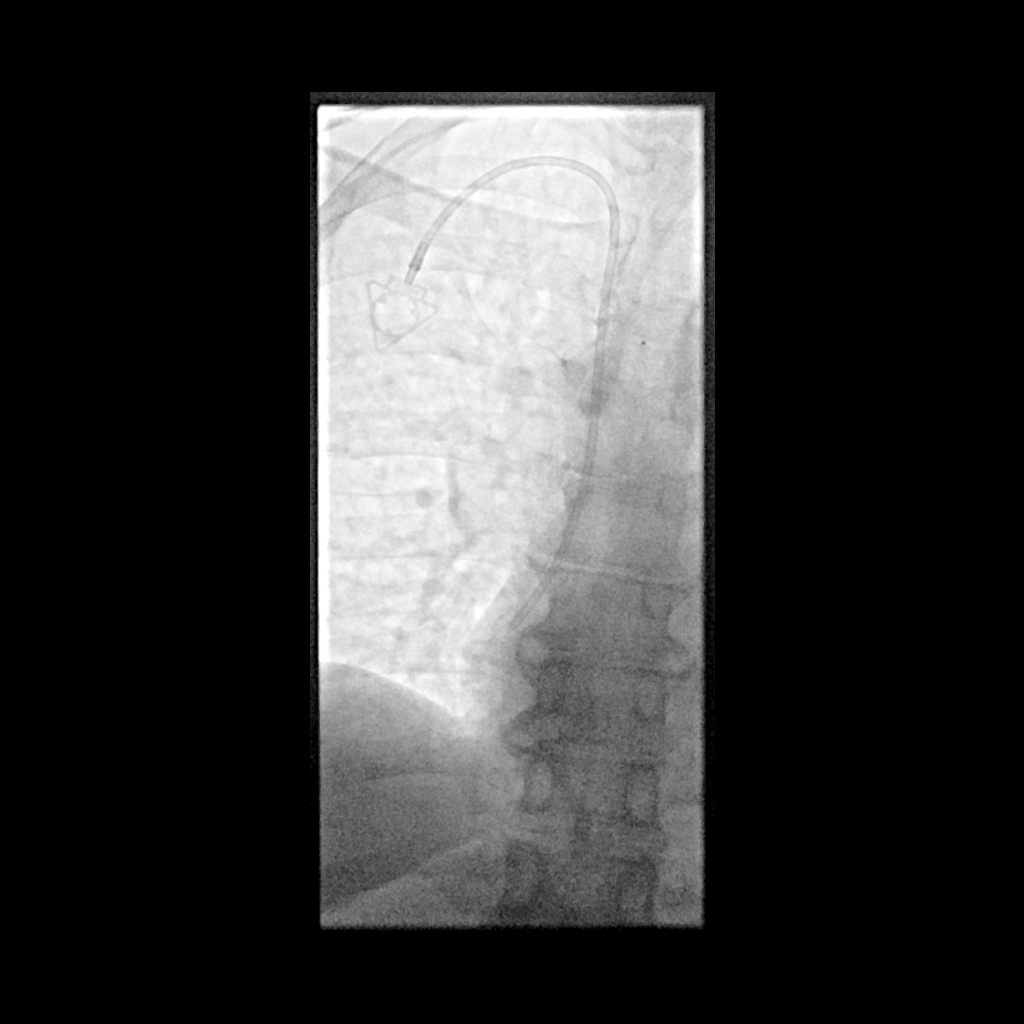

[1 of 1 positions shown; findings below may reference images not displayed]

EXAM:
RIGHT INTERNAL JUGULAR SINGLE LUMEN POWER PORT CATHETER INSERTION

Radiologist:  [place], [place]

Guidance:  Ultrasound and fluoroscopic

MEDICATIONS:
Ancef 2 g; The antibiotic was administered within an appropriate
time interval prior to skin puncture.

ANESTHESIA/SEDATION:
Versed 1.0 mg IV; Fentanyl 50 mcg IV;

Moderate Sedation Time:  26 minutes

The patient was continuously monitored during the procedure by the
interventional radiology nurse under my direct supervision.

FLUOROSCOPY TIME:  0 minutes, 42 seconds (4 mGy)

COMPLICATIONS:
None immediate.

CONTRAST:  None.

PROCEDURE:
Informed consent was obtained from the patient following explanation
of the procedure, risks, benefits and alternatives. The patient
understands, agrees and consents for the procedure. All questions
were addressed. A time out was performed.

Maximal barrier sterile technique utilized including caps, mask,
sterile gowns, sterile gloves, large sterile drape, hand hygiene,
and 2% chlorhexidine scrub.

Under sterile conditions and local anesthesia, right internal
jugular micropuncture venous access was performed. Access was
performed with ultrasound. Images were obtained for documentation. A
guide wire was inserted followed by a transitional dilator. This
allowed insertion of a guide wire and catheter into the IVC.
Measurements were obtained from the SVC / RA junction back to the
right IJ venotomy site. In the right infraclavicular chest, a
subcutaneous pocket was created over the second anterior rib. This
was done under sterile conditions and local anesthesia. 1% lidocaine
with epinephrine was utilized for this. A 2.5 cm incision was made
in the skin. Blunt dissection was performed to create a subcutaneous
pocket over the right pectoralis major muscle. The pocket was
flushed with saline vigorously. There was adequate hemostasis. The
port catheter was assembled and checked for leakage. The port
catheter was secured in the pocket with two retention sutures. The
tubing was tunneled subcutaneously to the right venotomy site and
inserted into the SVC/RA junction through a valved peel-away sheath.
Position was confirmed with fluoroscopy. Images were obtained for
documentation. The patient tolerated the procedure well. No
immediate complications. Incisions were closed in a two layer
fashion with 4 - 0 Vicryl suture. Dermabond was applied to the skin.
The port catheter was accessed, blood was aspirated followed by
saline and heparin flushes. Needle was removed. A dry sterile
dressing was applied.
IMPRESSION: Ultrasound and fluoroscopically guided right internal jugular single
lumen power port catheter insertion. Tip in the SVC/RA junction.
Catheter ready for use.

## 2021-03-10 NOTE — Progress Notes (Signed)
03/10/2021 9:14 AM   Rose Clements 1961-03-12 [id number]  Referring provider: Lyndon Code, MD [address] [place],  [place] [address]  Chief Complaint  Patient presents with  . Follow-up    HPI: I was consulted to assess the patient is urinary incontinence. She primarily has urge incontinence. Sometimes she leaks with coughing sneezing. She has infrequent bedwetting. She can leak without awareness. She has had multiple sclerosis for 10 years and is failed oxybutynin and Detrol and Vesicare.He wears 4-5 pads a day moderately wet or soaked.  She voids with a good flow. She can double void a large amount. She voids every 2 hours gets up 3 times at night.  Mild grade 2 hypermobility the bladder neck and no stress incontinence or prolapse  Patient has mixed incontinence. She has multiple sclerosis. She double voids a large amount. She is failed 3 medications. She has frequency and nocturia. She can leak without awareness. She has mild bedwetting.She is emptying well   On urodynamics maximum bladder capacity was 497 mL. Bladder was unstable reaching pressure 12 cm water. She had urgency but inhibited the contraction. She had no stress incontinence with Valsalva pressure 115 cm water. During voluntary voiding she voided 413 mL with a maximum flow of 60 mils per second. Max voiding pressure 34 cm of water. Residual 84 mL. EMG was present mildly increased during voiding. It was intermittent. Bladder neck descent at 1 or 2 cm. Small bladder urethral diverticulum or possible artifact noted. She had trouble to initiate her stream and she was generating a contraction. She did have external sphincter dyssynergia.  Patient has a neurogenic bladder with external sphincter dyssynergia. She would be at higher risk with Botox. Percutaneous tibial nerve stimulation and InterStim are good options for her. Her leakage without awareness and bedwetting and urge incontinence  are due to overactive bladder. She has very minimal stress incontinence based upon the clinical presentation. Reassess in 6 weeks on Myrbetriq 50 mg samples and prescription  Today Frequency stable. Less nocturia.  Down to 2 pads a day moderately wet and quite pleased.  Not infected  Reassess in 4 months.  Based upon neurogenic bladder I think we should have reasonable goals.  I may mention InterStim or PTNS next visit.  Overall she was pleased  Today Frequency stable.  Urge incontinence bit worse but she had about the Myrbetriq due to donut hole and it being $400.  Otherwise is $47.  Clinically not infected     PMH: Past Medical History:  Diagnosis Date  . Constipation   . Cystitis bacillary, chronic   . Depression   . Dysphagia    Dx by Beverely Risen on 10/15/14  . Eosinophilic esophagitis 11/19/2014   GE junction benign stricture s/p balloon dilation, retained food contents. ?gastroparesis versus secondary to EE.  Marland Kitchen Esophageal dysphagia   . GERD (gastroesophageal reflux disease)   . Heartburn   . Hypertension   . Migraine headache    "couple times each month"  . Multilevel degenerative disc disease   . Multiple sclerosis (HCC) 07/16/2015  . Port-A-Cath in place    right side  . Presence of intrathecal baclofen pump    right abdomen  . Vertigo    Rare - none in last 6 months    Surgical History: Past Surgical History:  Procedure Laterality Date  . ABDOMINAL HYSTERECTOMY  2003  . baclofen pump insertion Right 01/04/2018  . CARPAL TUNNEL RELEASE Right 1997  . CESAREAN SECTION  1989  . COLONOSCOPY WITH PROPOFOL N/A 10/28/2020   Procedure: COLONOSCOPY WITH PROPOFOL;  Surgeon: [name], MD;  Location: [facility];  Service: Endoscopy;  Laterality: N/A;  PRIORITY 4 port a cath  . CYSTECTOMY  2002   From chest  . ESOPHAGOGASTRODUODENOSCOPY (EGD) WITH PROPOFOL N/A 05/19/2016   Procedure: ESOPHAGOGASTRODUODENOSCOPY (EGD) WITH PROPOFOL;  Surgeon: [name],  MD;  Location: [facility];  Service: Endoscopy;  Laterality: N/A;  . IR IMAGING GUIDED PORT INSERTION  06/24/2020  . KIDNEY DONATION Left 2002  . OVARIAN CYST REMOVAL Left 2002   Ovarian mass removal  . TONSILLECTOMY  1973  . UPPER GASTROINTESTINAL ENDOSCOPY  11/19/2014   Benign appearing esophageal stricture-Dilated.    Home Medications:  Allergies as of 03/10/2021      Reactions   Lisinopril Hives      Medication List       Accurate as of March 10, 2021  9:14 AM. If you have any questions, ask your nurse or doctor.        STOP taking these medications   mirabegron ER 50 MG Tb24 tablet Commonly known as: MYRBETRIQ Stopped by: Martina Sinner, MD     TAKE these medications   acetaminophen 500 MG tablet Commonly known as: TYLENOL Take 500 mg by mouth every 6 (six) hours as needed (for pain.).   baclofen 10 MG tablet Commonly known as: LIORESAL Take 10 mg by mouth See admin instructions. Take 1 tablet (10 mg) by mouth scheduled in the morning & evening, may take an additional dose at bedtime if needed for spasms.   cetirizine 10 MG tablet Commonly known as: ZYRTEC Take 10 mg by mouth daily.   fluticasone 50 MCG/ACT nasal spray Commonly known as: FLONASE SPRAY 2 SPRAYS INTO EACH NOSTRIL EVERY DAY   natalizumab 300 MG/15ML injection Commonly known as: TYSABRI Inject 300 mg into the vein every 30 (thirty) days.   pantoprazole 40 MG tablet Commonly known as: PROTONIX TAKE 1 TABLET BY MOUTH EVERY DAY   Polyethylene Glycol Powd Take 17 g by mouth daily. What changed:   when to take this  reasons to take this   rizatriptan 10 MG disintegrating tablet Commonly known as: MAXALT-MLT Take 1 tablet (10 mg total) by mouth as needed for migraine. May repeat in 2 hours if needed   sertraline 100 MG tablet Commonly known as: ZOLOFT Take 1 tablet (100 mg total) by mouth daily.   topiramate 50 MG tablet Commonly known as: TOPAMAX Take 1 tablet (50 mg total) by  mouth 2 (two) times daily.   Vitamin D3 25 MCG (1000 UT) Caps Take 1,000 Units by mouth in the morning and at bedtime.       Allergies:  Allergies  Allergen Reactions  . Lisinopril Hives    Family History: Family History  Problem Relation Age of Onset  . Diabetes Mother   . Hypertension Mother   . Heart disease Mother   . Pulmonary embolism Mother   . Diabetes Father   . Diabetes Sister   . Hypertension Sister   . Heart disease Sister   . Bipolar disorder Sister   . Narcolepsy Brother   . Heart attack Maternal Grandmother   . Pancreatic cancer Maternal Grandfather   . Breast cancer Maternal Aunt     Social History:  reports that she has never smoked. She has never used smokeless tobacco. She reports that she does not drink alcohol and does not use drugs.  ROS:  Physical Exam: BP 117/76   Pulse 64   Ht 5\' 5"  (1.651 m)   Wt 88 kg   BMI 32.28 kg/m   Constitutional:  Alert and oriented, No acute distress. HEENT: [place] AT, moist mucus membranes.  Trachea midline, no masses.  Laboratory Data: Lab Results  Component Value Date   WBC 6.8 11/11/2020   HGB 11.2 11/11/2020   HCT 35.1 11/11/2020   MCV 93 11/11/2020   PLT 262 11/11/2020    Lab Results  Component Value Date   CREATININE 0.96 02/07/2020    No results found for: PSA  No results found for: TESTOSTERONE  No results found for: HGBA1C  Urinalysis    Component Value Date/Time   APPEARANCEUR Clear 03/03/2021 1000   GLUCOSEU Negative 03/03/2021 1000   BILIRUBINUR Negative 03/03/2021 1000   PROTEINUR Negative 03/03/2021 1000   NITRITE Negative 03/03/2021 1000   LEUKOCYTESUR 1+ (A) 03/03/2021 1000    Pertinent Imaging:   Assessment & Plan: 2 months of samples given.  She will call if she needs a new prescription.  I will see in a year.  We want her to get through the donut hole there is not an easy treatment path for her.  There are  no diagnoses linked to this encounter.  No follow-ups on file.  03/05/2021, MD  [facility] [address] [place], [place] [phone]

## 2021-04-02 DIAGNOSIS — G35 Multiple sclerosis: Secondary | ICD-10-CM | POA: Diagnosis not present

## 2021-04-04 ENCOUNTER — Telehealth: Payer: Self-pay | Admitting: Internal Medicine

## 2021-04-04 DIAGNOSIS — G35 Multiple sclerosis: Secondary | ICD-10-CM | POA: Diagnosis not present

## 2021-04-04 DIAGNOSIS — Z451 Encounter for adjustment and management of infusion pump: Secondary | ICD-10-CM | POA: Diagnosis not present

## 2021-04-04 NOTE — Progress Notes (Signed)
  Chronic Care Management   Outreach Note  04/04/2021 Name: Twanisha Foulk MRN: [id number] DOB: 05/28/1961  Referred by: Lyndon Code, MD Reason for referral : No chief complaint on file.   A second unsuccessful telephone outreach was attempted today. The patient was referred to pharmacist for assistance with care management and care coordination.  Follow Up Plan:   Carley Perdue [employer] Scheduler

## 2021-04-08 ENCOUNTER — Telehealth: Payer: Self-pay | Admitting: Internal Medicine

## 2021-04-08 NOTE — Progress Notes (Signed)
  Chronic Care Management   Note  04/08/2021 Name: Rose Clements MRN: [id number] DOB: Apr 08, 1961  Rose Clements is a 60 y.o. year old female who is a primary care patient of Lyndon Code, MD. I reached out to Domingo Cocking by phone today in response to a referral sent by Rose Clements's PCP, Lyndon Code, MD.   Rose Clements was given information about Chronic Care Management services today including:  1. CCM service includes personalized support from designated clinical staff supervised by her physician, including individualized plan of care and coordination with other care providers 2. 24/7 contact phone numbers for assistance for urgent and routine care needs. 3. Service will only be billed when office clinical staff spend 20 minutes or more in a month to coordinate care. 4. Only one practitioner may furnish and bill the service in a calendar month. 5. The patient may stop CCM services at any time (effective at the end of the month) by phone call to the office staff.   Patient agreed to services and verbal consent obtained.   Follow up plan:   Carley Perdue [employer] Scheduler

## 2021-04-30 DIAGNOSIS — G35 Multiple sclerosis: Secondary | ICD-10-CM | POA: Diagnosis not present

## 2021-05-01 ENCOUNTER — Other Ambulatory Visit: Payer: Self-pay

## 2021-05-01 ENCOUNTER — Ambulatory Visit (INDEPENDENT_AMBULATORY_CARE_PROVIDER_SITE_OTHER): Payer: Medicare Other | Admitting: Internal Medicine

## 2021-05-01 ENCOUNTER — Encounter: Payer: Self-pay | Admitting: Physician Assistant

## 2021-05-01 VITALS — BP 133/80 | HR 78 | Temp 98.4°F | Resp 16 | Ht 65.0 in | Wt 194.8 lb

## 2021-05-01 DIAGNOSIS — G35 Multiple sclerosis: Secondary | ICD-10-CM | POA: Diagnosis not present

## 2021-05-01 DIAGNOSIS — Z1231 Encounter for screening mammogram for malignant neoplasm of breast: Secondary | ICD-10-CM

## 2021-05-01 DIAGNOSIS — F331 Major depressive disorder, recurrent, moderate: Secondary | ICD-10-CM | POA: Diagnosis not present

## 2021-05-01 DIAGNOSIS — E2839 Other primary ovarian failure: Secondary | ICD-10-CM

## 2021-05-01 NOTE — Progress Notes (Signed)
[facility] [address] [place], [place] [address]  Internal MEDICINE  Office Visit Note  Patient Name: Rose Clements  [id number]  [id number]  Date of Service: 05/06/2021  Chief Complaint  Patient presents with  . Follow-up    Did not get blood work or breast exam done, but is planning to.  . Depression  . Hypertension  . Gastroesophageal Reflux    HPI  Patient is here for routine follow-up, her antidepressant were changed on previous visit from Cymbalta to Zoloft, she thinks she is feeling a little better but does might not be able she is on 50 mg once a day. She has not had bone density and also missed her mammogram appointment Followed by neurology for MS--has Baclofen pump, also currently being treated with Tysabri IV monthly History of mixed incontinence-followed by urology, recently started on Myrbetriq--somewhat beneficial at controlling incontinence She reports she has been recently struggling with depression, has many sad days  Current Medication: Outpatient Encounter Medications as of 05/01/2021  Medication Sig Note  . acetaminophen (TYLENOL) 500 MG tablet Take 500 mg by mouth every 6 (six) hours as needed (for pain.).   Marland Kitchen baclofen (LIORESAL) 10 MG tablet Take 10 mg by mouth See admin instructions. Take 1 tablet (10 mg) by mouth scheduled in the morning & evening, may take an additional dose at bedtime if needed for spasms.   . cetirizine (ZYRTEC) 10 MG tablet Take 10 mg by mouth daily.   . Cholecalciferol (VITAMIN D3) 1000 UNITS CAPS Take 1,000 Units by mouth in the morning and at bedtime.    . fluticasone (FLONASE) 50 MCG/ACT nasal spray SPRAY 2 SPRAYS INTO EACH NOSTRIL EVERY DAY   . natalizumab (TYSABRI) 300 MG/15ML injection Inject 300 mg into the vein every 30 (thirty) days.  11/18/2020: JCV ab drawn on 11/11/20 negative, index: 0.15   . pantoprazole (PROTONIX) 40 MG tablet TAKE 1 TABLET BY MOUTH EVERY DAY   . Polyethylene Glycol POWD Take 17 g by mouth  daily. (Patient taking differently: Take 17 g by mouth daily as needed (constipation.).)   . rizatriptan (MAXALT-MLT) 10 MG disintegrating tablet Take 1 tablet (10 mg total) by mouth as needed for migraine. May repeat in 2 hours if needed   . sertraline (ZOLOFT) 100 MG tablet Take 1 tablet (100 mg total) by mouth daily.   Marland Kitchen topiramate (TOPAMAX) 50 MG tablet Take 1 tablet (50 mg total) by mouth 2 (two) times daily.    No facility-administered encounter medications on file as of 05/01/2021.    Surgical History: Past Surgical History:  Procedure Laterality Date  . ABDOMINAL HYSTERECTOMY  2003  . baclofen pump insertion Right 01/04/2018  . CARPAL TUNNEL RELEASE Right 1997  . CESAREAN SECTION  1989  . COLONOSCOPY WITH PROPOFOL N/A 10/28/2020   Procedure: COLONOSCOPY WITH PROPOFOL;  Surgeon: [name], MD;  Location: [facility];  Service: Endoscopy;  Laterality: N/A;  PRIORITY 4 port a cath  . CYSTECTOMY  2002   From chest  . ESOPHAGOGASTRODUODENOSCOPY (EGD) WITH PROPOFOL N/A 05/19/2016   Procedure: ESOPHAGOGASTRODUODENOSCOPY (EGD) WITH PROPOFOL;  Surgeon: [name], MD;  Location: [facility];  Service: Endoscopy;  Laterality: N/A;  . IR IMAGING GUIDED PORT INSERTION  06/24/2020  . KIDNEY DONATION Left 2002  . OVARIAN CYST REMOVAL Left 2002   Ovarian mass removal  . TONSILLECTOMY  1973  . UPPER GASTROINTESTINAL ENDOSCOPY  11/19/2014   Benign appearing esophageal stricture-Dilated.    Medical History: Past Medical History:  Diagnosis Date  . Constipation   . Cystitis bacillary, chronic   . Depression   . Dysphagia    Dx by Beverely Risen on 10/15/14  . Eosinophilic esophagitis 11/19/2014   GE junction benign stricture s/p balloon dilation, retained food contents. ?gastroparesis versus secondary to EE.  Marland Kitchen Esophageal dysphagia   . GERD (gastroesophageal reflux disease)   . Heartburn   . Hypertension   . Migraine headache    "couple times each month"  . Multilevel  degenerative disc disease   . Multiple sclerosis (HCC) 07/16/2015  . Port-A-Cath in place    right side  . Presence of intrathecal baclofen pump    right abdomen  . Vertigo    Rare - none in last 6 months    Family History: Family History  Problem Relation Age of Onset  . Diabetes Mother   . Hypertension Mother   . Heart disease Mother   . Pulmonary embolism Mother   . Diabetes Father   . Diabetes Sister   . Hypertension Sister   . Heart disease Sister   . Bipolar disorder Sister   . Narcolepsy Brother   . Heart attack Maternal Grandmother   . Pancreatic cancer Maternal Grandfather   . Breast cancer Maternal Aunt     Social History   Socioeconomic History  . Marital status: Married    Spouse name: [name]  . Number of children: 1  . Years of education: Not on file  . Highest education level: Not on file  Occupational History  . Occupation: Disability  Tobacco Use  . Smoking status: Never Smoker  . Smokeless tobacco: Never Used  Vaping Use  . Vaping Use: Never used  Substance and Sexual Activity  . Alcohol use: No    Alcohol/week: 0.0 standard drinks    Comment: rare wine on Holidays  . Drug use: No  . Sexual activity: Not Currently  Other Topics Concern  . Not on file  Social History Narrative   Lives with husband, Michole Lecuyer   Right handed    Caffeine use: tea sometimes (mostly herbal)   Social Determinants of Health   Financial Resource Strain: Not on file  Food Insecurity: Not on file  Transportation Needs: Not on file  Physical Activity: Not on file  Stress: Not on file  Social Connections: Not on file  Intimate Partner Violence: Not on file      Review of Systems  Constitutional: Negative for chills, fatigue and unexpected weight change.  HENT: Positive for postnasal drip. Negative for congestion, rhinorrhea, sneezing and sore throat.   Eyes: Negative for redness.  Respiratory: Negative for cough, chest tightness and shortness of breath.    Cardiovascular: Negative for chest pain and palpitations.  Gastrointestinal: Negative for abdominal pain, constipation, diarrhea, nausea and vomiting.  Genitourinary: Negative for dysuria and frequency.  Musculoskeletal: Negative for arthralgias, back pain, joint swelling and neck pain.  Skin: Negative for rash.  Neurological: Negative.  Negative for tremors and numbness.  Hematological: Negative for adenopathy. Does not bruise/bleed easily.  Psychiatric/Behavioral: Positive for dysphoric mood. Negative for behavioral problems (Depression), sleep disturbance and suicidal ideas. The patient is not nervous/anxious.     Vital Signs: BP 133/80   Pulse 78   Temp 98.4 F (36.9 C)   Resp 16   Ht 5\' 5"  (1.651 m)   Wt 194 lb 12.8 oz (88.4 kg)   SpO2 96%   BMI 32.42 kg/m    Physical Exam Constitutional:  Appearance: Normal appearance.  HENT:     Head: Normocephalic and atraumatic.     Nose: Nose normal.     Mouth/Throat:     Mouth: Mucous membranes are moist.     Pharynx: No posterior oropharyngeal erythema.  Eyes:     Extraocular Movements: Extraocular movements intact.     Pupils: Pupils are equal, round, and reactive to light.  Cardiovascular:     Pulses: Normal pulses.     Heart sounds: Normal heart sounds.  Pulmonary:     Effort: Pulmonary effort is normal.     Breath sounds: Normal breath sounds.  Neurological:     General: No focal deficit present.     Mental Status: She is alert.  Psychiatric:        Behavior: Behavior normal.     Comments: Depressed mood        Assessment/Plan: 1. Multiple sclerosis (HCC) This is stable at the moment with no flareups continue to be followed by neurology  2. Major depressive disorder, recurrent episode, moderate (HCC) We will increase her Zoloft to 100 mg once a day and patient was also instructed to increase 125 if her depression symptoms are not 100% under control, however if this therapy fails we might try add-on or a  new class of medicine  3. Visit for screening mammogram Updated mammogram and significant order for screening mammogram - MM DIGITAL SCREENING BILATERAL; Future  4. Other primary ovarian failure Patient is menopausal and is high risk for bone loss we will order bone density to look into any bone loss - DG Bone Density; Future  General Counseling: Rose Clements verbalizes understanding of the findings of todays visit and agrees with plan of treatment. I have discussed any further diagnostic evaluation that may be needed or ordered today. We also reviewed her medications today. she has been encouraged to call the office with any questions or concerns that should arise related to todays visit.    Orders Placed This Encounter  Procedures  . MM DIGITAL SCREENING BILATERAL  . DG Bone Density    No orders of the defined types were placed in this encounter.   Total time spent:35Minutes Time spent includes review of chart, medications, test results, and follow up plan with the patient.   [place] Controlled Substance Database was reviewed by me.   Dr Lyndon Code Internal medicine

## 2021-05-12 ENCOUNTER — Telehealth: Payer: Self-pay | Admitting: Pharmacist

## 2021-05-12 ENCOUNTER — Telehealth (INDEPENDENT_AMBULATORY_CARE_PROVIDER_SITE_OTHER): Payer: Medicare Other | Admitting: Neurology

## 2021-05-12 ENCOUNTER — Encounter: Payer: Self-pay | Admitting: Neurology

## 2021-05-12 ENCOUNTER — Telehealth: Payer: Self-pay | Admitting: Neurology

## 2021-05-12 ENCOUNTER — Encounter: Payer: Self-pay | Admitting: Nurse Practitioner

## 2021-05-12 ENCOUNTER — Ambulatory Visit (INDEPENDENT_AMBULATORY_CARE_PROVIDER_SITE_OTHER): Payer: Medicare Other | Admitting: Nurse Practitioner

## 2021-05-12 VITALS — Temp 98.4°F | Ht 65.0 in | Wt 196.0 lb

## 2021-05-12 DIAGNOSIS — J0110 Acute frontal sinusitis, unspecified: Secondary | ICD-10-CM

## 2021-05-12 DIAGNOSIS — G43009 Migraine without aura, not intractable, without status migrainosus: Secondary | ICD-10-CM | POA: Diagnosis not present

## 2021-05-12 DIAGNOSIS — Z79899 Other long term (current) drug therapy: Secondary | ICD-10-CM

## 2021-05-12 DIAGNOSIS — G4700 Insomnia, unspecified: Secondary | ICD-10-CM

## 2021-05-12 DIAGNOSIS — G35 Multiple sclerosis: Secondary | ICD-10-CM | POA: Diagnosis not present

## 2021-05-12 DIAGNOSIS — J040 Acute laryngitis: Secondary | ICD-10-CM

## 2021-05-12 DIAGNOSIS — N3941 Urge incontinence: Secondary | ICD-10-CM | POA: Diagnosis not present

## 2021-05-12 DIAGNOSIS — R5383 Other fatigue: Secondary | ICD-10-CM

## 2021-05-12 MED ORDER — AMOXICILLIN-POT CLAVULANATE 875-125 MG PO TABS: 1.0000 | ORAL_TABLET | Freq: Two times a day (BID) | ORAL | 0 refills | Status: DC

## 2021-05-12 MED ORDER — IMIPRAMINE HCL 25 MG PO TABS: 25.0000 mg | ORAL_TABLET | Freq: Every day | ORAL | 3 refills | Status: DC

## 2021-05-12 NOTE — Telephone Encounter (Signed)
..   Pt understands that although there may be some limitations with this type of visit, we will take all precautions to reduce any security or privacy concerns.  Pt understands that this will be treated like an in office visit and we will file with pt's insurance, and there may be a patient responsible charge related to this service. ? ?

## 2021-05-12 NOTE — Telephone Encounter (Signed)
Called and spoke w/ pt. Updated chart for VV today w/ Dr. Epimenio Foot. She is also going to call PCP to see about getting treated for current sx.

## 2021-05-12 NOTE — Progress Notes (Addendum)
[facility]  PATIENT: Rose Clements DOB: May 07, 1961  REFERRING DOCTOR OR PCP:  Beverely Risen, MD SOURCE: Patient, notes from primary care and neurology, MRI and laboratory reports, MRI images personally reviewed.  _________________________________   HISTORICAL  CHIEF COMPLAINT:  Chief Complaint  Patient presents with  . Multiple Sclerosis    On Tysabri.  Last JCV Ab test 12/21 was negative   Mckay Brandt is a 60 y.o.Mar[place] Kitchen woman with relapsing multiple sclerosis.    Virtual Visit via Video Note I connected with Domingo Cocking on 05/12/21 at 11:30 AM EDT by a video enabled telemedicine application and verified that I am speaking with the correct person.  I discussed the limitations of evaluation and management by telemedicine and the availability of in person appointments. The patient expressed understanding and agreed to proceed.  Patient: Home Provider: Office  History of Present Illness: Update 05/12/2021: She is on Tysabri.    JCV Ab was negative 11/2020  We will check again at next infusion.    She has had an URI x 3-4 days.  The Covid home test was negative and she will take it again soon.  She has a lot of coughing up mucus.  She has reduced voice.  She also had fever and took Tylenol with benefit.  Augmentin was called in by her PCP  She uses a walker and feels gait is stable.   She has had a couple falls but no injuries.    Her right leg will give out more.  The left leg has more swelling.   She has some right hand weakness and reduced coordination as well.   She has seen urology and has been started on Myrbetriq for urinary urgency.    She has a baclofen pump.   She was switched to higher concentration so only needs a refill twice a year now.   She also takes baclofen pills as needed when she notes more tightness in her legs.    She also notes spasticity in her neck.     She has fatigue.  She tried Modafinil and felt it dried her out and did not help much.   She  does not sleep well.   She snores but does not have OSA.    She reports PSG with Dr. Freda Munro in [place] showed narcolepsy.  She does not described cataplexy symptoms     She has vivid dreams and occasional hypnagogic hallucinations and sleep paralysis.    She gets headaches some nights.   She takes topiramate with only mild benefit and she has 2 bad HA/month and several milder ones.   There are migrainous features with nausea, photophobia/phonopobia.   She takes Tylenol when one occurs which usually helps.   She has never tried a triptan though we prescribed Maxalt.      MS HIstory:  She  was diagnosed with relapsing remitting MS in 2011 after she presented with stiffness in her left arm and a clumsy gait with some falls.   In retrospect some of the leg symptoms started a little earlier.    She had MRI's and LP with lesions and CSF findings consistent with MS.    She was placed on Avonex.   She did ok initially but then had an exacerbation with more issues with her legs.   She was started on Tysabri about 2016 after these issues.   She has done well with Tysabri and has not had any exacerbations.   Initially, she  was diagnosed and treated in [place].  She moved to [facility]) in 2016 and has seen Dr. Ronalee Red at [facility] since that time.  She transferred care to [place] mid 2021.    IMAGING: MRI of the cervical spine 03/07/2018 and 05/15/2018 and 01/19/2020 all show a couple T2 hyperintense lesions within the spinal cord.  One to the left adjacent to C2-C3 and 1 to the right adjacent to C4.    Also has right thyroid cyst.   MRI of the brain 05/15/2018 and 03/07/2018 and 2/212/2021 show multiple T2/flair hyperintense foci.  Somewhat nonspecific but a few are periventricular and juxtacortical.  There is no change between the 3 studies.     MRI of the thoracic spine 05/16/2015 did not show any definite lesions.   Hemangiomas within the T2 and T6 vertebral body.    Observations/Objective: She is a  well-developed well-nourished woman in no acute distress.  The head is normocephalic and atraumatic.  Sclera are anicteric.  Visible skin appears normal.  The neck has a good range of motion.  Pharynx and tongue have normal appearance.  She is alert and fully oriented with fluent speech and good attention, knowledge and memory.  Extraocular muscles are intact.  Facial strength is normal.  Palatal elevation and tongue protrusion are midline.  She appears to have normal strength in the arms.  Rapid alternating movements and finger-nose-finger are performed well.   REVIEW OF SYSTEMS: Constitutional: No fevers, chills, sweats, or change in appetite Eyes: No visual changes, double vision, eye pain Ear, nose and throat: No hearing loss, ear pain, nasal congestion, sore throat Cardiovascular: No chest pain, palpitations Respiratory: No shortness of breath at rest or with exertion.   No wheezes GastrointestinaI: No nausea, vomiting, diarrhea, abdominal pain, fecal incontinence Genitourinary: No dysuria, urinary retention or frequency.  No nocturia. Musculoskeletal: No neck pain, back pain Integumentary: No rash, pruritus, skin lesions Neurological: as above Psychiatric: No depression at this time.  No anxiety Endocrine: No palpitations, diaphoresis, change in appetite, change in weigh or increased thirst Hematologic/Lymphatic: No anemia, purpura, petechiae. Allergic/Immunologic: No itchy/runny eyes, nasal congestion, recent allergic reactions, rashes  ALLERGIES: Allergies  Allergen Reactions  . Lisinopril Hives    HOME MEDICATIONS:  Current Outpatient Medications:  .  imipramine (TOFRANIL) 25 MG tablet, Take 1 tablet (25 mg total) by mouth at bedtime., Disp: 30 tablet, Rfl: 3 .  acetaminophen (TYLENOL) 500 MG tablet, Take 500 mg by mouth every 6 (six) hours as needed (for pain.)., Disp: , Rfl:  .  amoxicillin-clavulanate (AUGMENTIN) 875-125 MG tablet, Take 1 tablet by mouth 2 (two) times  daily., Disp: 20 tablet, Rfl: 0 .  baclofen (LIORESAL) 10 MG tablet, Take 10 mg by mouth See admin instructions. Take 1 tablet (10 mg) by mouth scheduled in the morning & evening, may take an additional dose at bedtime if needed for spasms., Disp: , Rfl:  .  cetirizine (ZYRTEC) 10 MG tablet, Take 10 mg by mouth daily., Disp: , Rfl:  .  Cholecalciferol (VITAMIN D3) 1000 UNITS CAPS, Take 1,000 Units by mouth in the morning and at bedtime. , Disp: , Rfl:  .  fluticasone (FLONASE) 50 MCG/ACT nasal spray, SPRAY 2 SPRAYS INTO EACH NOSTRIL EVERY DAY, Disp: 48 mL, Rfl: 2 .  mirabegron ER (MYRBETRIQ) 50 MG TB24 tablet, Take 50 mg by mouth daily., Disp: , Rfl:  .  natalizumab (TYSABRI) 300 MG/15ML injection, Inject 300 mg into the vein every 30 (thirty) days. , Disp: ,  Rfl:  .  pantoprazole (PROTONIX) 40 MG tablet, TAKE 1 TABLET BY MOUTH EVERY DAY, Disp: 90 tablet, Rfl: 1 .  Polyethylene Glycol POWD, Take 17 g by mouth daily. (Patient taking differently: Take 17 g by mouth daily as needed (constipation.).), Disp: 527 g, Rfl: 12 .  rizatriptan (MAXALT-MLT) 10 MG disintegrating tablet, Take 1 tablet (10 mg total) by mouth as needed for migraine. May repeat in 2 hours if needed, Disp: 9 tablet, Rfl: 11 .  sertraline (ZOLOFT) 100 MG tablet, Take 1 tablet (100 mg total) by mouth daily., Disp: 30 tablet, Rfl: 3 .  topiramate (TOPAMAX) 50 MG tablet, Take 1 tablet (50 mg total) by mouth 2 (two) times daily., Disp: 180 tablet, Rfl: 3  PAST MEDICAL HISTORY: Past Medical History:  Diagnosis Date  . Constipation   . Cystitis bacillary, chronic   . Depression   . Dysphagia    Dx by Beverely Risen on 10/15/14  . Eosinophilic esophagitis 11/19/2014   GE junction benign stricture s/p balloon dilation, retained food contents. ?gastroparesis versus secondary to EE.  Mar[place] Kitchen Esophageal dysphagia   . GERD (gastroesophageal reflux disease)   . Heartburn   . Hypertension   . Migraine headache    "couple times each month"  .  Multilevel degenerative disc disease   . Multiple sclerosis (HCC) 07/16/2015  . Port-A-Cath in place    right side  . Presence of intrathecal baclofen pump    right abdomen  . Vertigo    Rare - none in last 6 months    PAST SURGICAL HISTORY: Past Surgical History:  Procedure Laterality Date  . ABDOMINAL HYSTERECTOMY  2003  . baclofen pump insertion Right 01/04/2018  . CARPAL TUNNEL RELEASE Right 1997  . CESAREAN SECTION  1989  . COLONOSCOPY WITH PROPOFOL N/A 10/28/2020   Procedure: COLONOSCOPY WITH PROPOFOL;  Surgeon: [name], MD;  Location: [facility];  Service: Endoscopy;  Laterality: N/A;  PRIORITY 4 port a cath  . CYSTECTOMY  2002   From chest  . ESOPHAGOGASTRODUODENOSCOPY (EGD) WITH PROPOFOL N/A 05/19/2016   Procedure: ESOPHAGOGASTRODUODENOSCOPY (EGD) WITH PROPOFOL;  Surgeon: [name], MD;  Location: [facility];  Service: Endoscopy;  Laterality: N/A;  . IR IMAGING GUIDED PORT INSERTION  06/24/2020  . KIDNEY DONATION Left 2002  . OVARIAN CYST REMOVAL Left 2002   Ovarian mass removal  . TONSILLECTOMY  1973  . UPPER GASTROINTESTINAL ENDOSCOPY  11/19/2014   Benign appearing esophageal stricture-Dilated.    FAMILY HISTORY: Family History  Problem Relation Age of Onset  . Diabetes Mother   . Hypertension Mother   . Heart disease Mother   . Pulmonary embolism Mother   . Diabetes Father   . Diabetes Sister   . Hypertension Sister   . Heart disease Sister   . Bipolar disorder Sister   . Narcolepsy Brother   . Heart attack Maternal Grandmother   . Pancreatic cancer Maternal Grandfather   . Breast cancer Maternal Aunt           DIAGNOSTIC DATA (LABS, IMAGING, TESTING) - I reviewed patient records, labs, notes, testing and imaging myself where available.  Lab Results  Component Value Date   WBC 6.8 11/11/2020   HGB 11.2 11/11/2020   HCT 35.1 11/11/2020   MCV 93 11/11/2020   PLT 262 11/11/2020      Component Value Date/Time   NA 143  02/07/2020 1050   K 4.6 02/07/2020 1050   CL 107 (H) 02/07/2020 1050   CO2  20 02/07/2020 1050   GLUCOSE 87 02/07/2020 1050   GLUCOSE 101 (H) 02/15/2019 1500   BUN 9 02/07/2020 1050   CREATININE 0.96 02/07/2020 1050   CALCIUM 9.4 02/07/2020 1050   PROT 6.8 02/07/2020 1050   ALBUMIN 4.4 02/07/2020 1050   AST 14 02/07/2020 1050   ALT 5 02/07/2020 1050   ALKPHOS 88 02/07/2020 1050   BILITOT 0.5 02/07/2020 1050   GFRNONAA 66 02/07/2020 1050   GFRAA 76 02/07/2020 1050   Lab Results  Component Value Date   CHOL 222 (H) 02/07/2020   HDL 66 02/07/2020   LDLCALC 144 (H) 02/07/2020   TRIG 71 02/07/2020   No results found for: HGBA1C No results found for: VITAMINB12 Lab Results  Component Value Date   TSH 1.350 02/07/2020       ASSESSMENT AND PLAN  Multiple sclerosis (HCC) - Plan: Stratify JCV Antibody Test ([facility]), CBC with Differential/Platelet  High risk medication use - Plan: Stratify JCV Antibody Test ([facility]), CBC with Differential/Platelet  Urge incontinence  Migraine without aura and without status migrainosus, not intractable  Other fatigue  Insomnia, unspecified type  1.   Continue Tysabri infusions.  We will check the JCV antibody and CBC with differential at next infusion.  She now has a central line.   2.    Continue topiramate and Maxalt prn for migrianes 3.   Stay active and exercise as tolerated. 4.    Continue Myrbetriq and follow-up with urology.   5.   She has a baclofen pump filled at [facility].  She reports no recent changes 6.    Imipramine 25 mg nightly for insomnia, urinary urgency and migraine 7.   Return for follow-up in 6 months  or call sooner if new or worsening neurologic symptoms.  Follow Up Instructions: I discussed the assessment and treatment plan with the patient. The patient was provided an opportunity to ask questions and all were answered. The patient agreed with the plan and demonstrated an understanding of the instructions.    The  patient was advised to call back or seek an in-person evaluation if the symptoms worsen or if the condition fails to improve as anticipated.  I provided 28 minutes of non-face-to-face time during this encounter.      Saifullah Jolley A. Epimenio Foot, MD, [facility] 05/12/2021, 11:54 AM Certified in Neurology, Clinical Neurophysiology, Sleep Medicine and Neuroimaging  [facility] [address] [place], [place] [address] [phone]

## 2021-05-12 NOTE — Progress Notes (Addendum)
Chronic Care Management Pharmacy Assistant   Name: Rose Clements  MRN: [id number] DOB: [birth year]  Rose Clements is an 60 y.o. year old female who presents for his initial CCM visit with the clinical pharmacist.  Reason for Encounter: Chart Prep    Conditions to be addressed/monitored: Migraine, GERD, HTN, Depression, Anxiety, Constipation, Insomnia.  Primary concerns for visit include: HTN.  Recent office visits:  05/12/21 Sallyanne Kuster, NP. For acute frontal sinusitis. STARTED Amoxicillin-Pot-Clavulanate 875-125 MG 1 tablet 2 times daily.  05/01/21 Lyndon Code, MD. For follow-up. Per note:We will increase her Zoloft to 100 mg once a day and patient was also instructed to increase 125 if her depression symptoms are not 100% under control. 03/03/21 McDonough, Salomon Fick, PA-C. For general adult medical examination. INCREASED Sertraline to 100 mg daily. STOPPED Duloxetine.   01/02/21 Theotis Burrow, NP. For follow-up. STARTED Sertraline 50 mg daily.   Recent consult visits:  05/12/21 Neurology Sater, Pearletha Furl, MD. For follow-up. STARTED Imipramine HCL 25 mg daily at bedtime.  04/04/21 Neurosurgery Parente, Netty Starring, PA  For leg pain. Per note:We will increase Ms. Klinke intrathecal baclofen a small amount today. 03/10/21 Urology MacDiarmid, Lorin Picket, MD. For mixed incontinence. STOPPED Mirabegron.  12/11/20 Neurology Sater, Richard A. Per note: IV infusion was given. No information given about medications.  Hospital visits:  None in previous 6 months  Medication History: N/A  Medications: Outpatient Encounter Medications as of 05/12/2021  Medication Sig Note   acetaminophen (TYLENOL) 500 MG tablet Take 500 mg by mouth every 6 (six) hours as needed (for pain.).    amoxicillin-clavulanate (AUGMENTIN) 875-125 MG tablet Take 1 tablet by mouth 2 (two) times daily.    baclofen (LIORESAL) 10 MG tablet Take 10 mg by mouth See admin instructions. Take 1 tablet (10 mg) by mouth scheduled in the  morning & evening, may take an additional dose at bedtime if needed for spasms.    cetirizine (ZYRTEC) 10 MG tablet Take 10 mg by mouth daily.    Cholecalciferol (VITAMIN D3) 1000 UNITS CAPS Take 1,000 Units by mouth in the morning and at bedtime.     fluticasone (FLONASE) 50 MCG/ACT nasal spray SPRAY 2 SPRAYS INTO EACH NOSTRIL EVERY DAY    imipramine (TOFRANIL) 25 MG tablet Take 1 tablet (25 mg total) by mouth at bedtime.    mirabegron ER (MYRBETRIQ) 50 MG TB24 tablet Take 50 mg by mouth daily.    natalizumab (TYSABRI) 300 MG/15ML injection Inject 300 mg into the vein every 30 (thirty) days.  11/18/2020: JCV ab drawn on 11/11/20 negative, index: 0.15    pantoprazole (PROTONIX) 40 MG tablet TAKE 1 TABLET BY MOUTH EVERY DAY    Polyethylene Glycol POWD Take 17 g by mouth daily. (Patient taking differently: Take 17 g by mouth daily as needed (constipation.).)    rizatriptan (MAXALT-MLT) 10 MG disintegrating tablet Take 1 tablet (10 mg total) by mouth as needed for migraine. May repeat in 2 hours if needed    sertraline (ZOLOFT) 100 MG tablet Take 1 tablet (100 mg total) by mouth daily.    topiramate (TOPAMAX) 50 MG tablet Take 1 tablet (50 mg total) by mouth 2 (two) times daily.    No facility-administered encounter medications on file as of 05/12/2021.    Have you seen any other providers since your last visit? Patient stated no.  Any changes in your medications or health? Patient stated no.  Any side effects from any medications? Patient stated no.  Do  you have an symptoms or problems not managed by your medications? Patient stated no.  Any concerns about your health right now? Patient stated no.  Has your provider asked that you check blood pressure, blood sugar, or follow special diet at home? Patient stated no.  Do you get any type of exercise on a regular basis? Patient stated no.  Can you think of a goal you would like to reach for your health? Patient stated loose weight.   Do you  have any problems getting your medications? Patient stated no.   Is there anything that you would like to discuss during the appointment? Patient stated no.  Please bring medications and supplements to appointment, patient reminded of OTP appointment on 05/14/21 at 130 pm.   Follow-up:Pharmacist Review   Hulen Luster, RMA Clinical Pharmacist Assistant [phone]

## 2021-05-12 NOTE — Progress Notes (Signed)
[facility] [address] [place], [place] [address]  Internal MEDICINE  Telephone Visit  Patient Name: Rose Clements  [id number]  [id number]  Date of Service: 05/12/2021  I connected with the patient at 9:12 AM by telephone and verified the patients identity using two identifiers.   I discussed the limitations, risks, security and privacy concerns of performing an evaluation and management service by telephone and the availability of in person appointments. I also discussed with the patient that there may be a patient responsible charge related to the service.  The patient expressed understanding and agrees to proceed.    Chief Complaint  Patient presents with  . Telephone Assessment    [phone]  . Telephone Screen    Covid home test is negative   . Laryngitis  . Cough    Yellow mucus   . Sinusitis  . Fever  . Chills    HPI Rose Clements presents for a virtual video acute sick visit for laryngitis, cough with yellow sputum, sinus pain/pressure, fever and chills. Her home covid test was negative this morning. She has been experiencing these symptoms for 6 days.     Current Medication: Outpatient Encounter Medications as of 05/12/2021  Medication Sig Note  . acetaminophen (TYLENOL) 500 MG tablet Take 500 mg by mouth every 6 (six) hours as needed (for pain.).   Marland Kitchen amoxicillin-clavulanate (AUGMENTIN) 875-125 MG tablet Take 1 tablet by mouth 2 (two) times daily.   . baclofen (LIORESAL) 10 MG tablet Take 10 mg by mouth See admin instructions. Take 1 tablet (10 mg) by mouth scheduled in the morning & evening, may take an additional dose at bedtime if needed for spasms.   . cetirizine (ZYRTEC) 10 MG tablet Take 10 mg by mouth daily.   . Cholecalciferol (VITAMIN D3) 1000 UNITS CAPS Take 1,000 Units by mouth in the morning and at bedtime.    . fluticasone (FLONASE) 50 MCG/ACT nasal spray SPRAY 2 SPRAYS INTO EACH NOSTRIL EVERY DAY   . mirabegron ER (MYRBETRIQ) 50 MG TB24 tablet Take  50 mg by mouth daily.   . natalizumab (TYSABRI) 300 MG/15ML injection Inject 300 mg into the vein every 30 (thirty) days.  11/18/2020: JCV ab drawn on 11/11/20 negative, index: 0.15   . pantoprazole (PROTONIX) 40 MG tablet TAKE 1 TABLET BY MOUTH EVERY DAY   . Polyethylene Glycol POWD Take 17 g by mouth daily. (Patient taking differently: Take 17 g by mouth daily as needed (constipation.).)   . rizatriptan (MAXALT-MLT) 10 MG disintegrating tablet Take 1 tablet (10 mg total) by mouth as needed for migraine. May repeat in 2 hours if needed   . sertraline (ZOLOFT) 100 MG tablet Take 1 tablet (100 mg total) by mouth daily.   Marland Kitchen topiramate (TOPAMAX) 50 MG tablet Take 1 tablet (50 mg total) by mouth 2 (two) times daily.    No facility-administered encounter medications on file as of 05/12/2021.    Surgical History: Past Surgical History:  Procedure Laterality Date  . ABDOMINAL HYSTERECTOMY  2003  . baclofen pump insertion Right 01/04/2018  . CARPAL TUNNEL RELEASE Right 1997  . CESAREAN SECTION  1989  . COLONOSCOPY WITH PROPOFOL N/A 10/28/2020   Procedure: COLONOSCOPY WITH PROPOFOL;  Surgeon: [name], MD;  Location: [facility];  Service: Endoscopy;  Laterality: N/A;  PRIORITY 4 port a cath  . CYSTECTOMY  2002   From chest  . ESOPHAGOGASTRODUODENOSCOPY (EGD) WITH PROPOFOL N/A 05/19/2016   Procedure: ESOPHAGOGASTRODUODENOSCOPY (EGD) WITH PROPOFOL;  Surgeon:  [name], MD;  Location: [facility];  Service: Endoscopy;  Laterality: N/A;  . IR IMAGING GUIDED PORT INSERTION  06/24/2020  . KIDNEY DONATION Left 2002  . OVARIAN CYST REMOVAL Left 2002   Ovarian mass removal  . TONSILLECTOMY  1973  . UPPER GASTROINTESTINAL ENDOSCOPY  11/19/2014   Benign appearing esophageal stricture-Dilated.    Medical History: Past Medical History:  Diagnosis Date  . Constipation   . Cystitis bacillary, chronic   . Depression   . Dysphagia    Dx by Beverely Risen on 10/15/14  . Eosinophilic  esophagitis 11/19/2014   GE junction benign stricture s/p balloon dilation, retained food contents. ?gastroparesis versus secondary to EE.  Marland Kitchen Esophageal dysphagia   . GERD (gastroesophageal reflux disease)   . Heartburn   . Hypertension   . Migraine headache    "couple times each month"  . Multilevel degenerative disc disease   . Multiple sclerosis (HCC) 07/16/2015  . Port-A-Cath in place    right side  . Presence of intrathecal baclofen pump    right abdomen  . Vertigo    Rare - none in last 6 months    Family History: Family History  Problem Relation Age of Onset  . Diabetes Mother   . Hypertension Mother   . Heart disease Mother   . Pulmonary embolism Mother   . Diabetes Father   . Diabetes Sister   . Hypertension Sister   . Heart disease Sister   . Bipolar disorder Sister   . Narcolepsy Brother   . Heart attack Maternal Grandmother   . Pancreatic cancer Maternal Grandfather   . Breast cancer Maternal Aunt     Social History   Socioeconomic History  . Marital status: Married    Spouse name: [name]  . Number of children: 1  . Years of education: Not on file  . Highest education level: Not on file  Occupational History  . Occupation: Disability  Tobacco Use  . Smoking status: Never Smoker  . Smokeless tobacco: Never Used  Vaping Use  . Vaping Use: Never used  Substance and Sexual Activity  . Alcohol use: No    Alcohol/week: 0.0 standard drinks    Comment: rare wine on Holidays  . Drug use: No  . Sexual activity: Not Currently  Other Topics Concern  . Not on file  Social History Narrative   Lives with husband, Teresea Donley   Right handed    Caffeine use: tea sometimes (mostly herbal)   Social Determinants of Health   Financial Resource Strain: Not on file  Food Insecurity: Not on file  Transportation Needs: Not on file  Physical Activity: Not on file  Stress: Not on file  Social Connections: Not on file  Intimate Partner Violence: Not on file       Review of Systems  Constitutional: Positive for chills, fatigue and fever (100.4F).  HENT: Positive for congestion, postnasal drip, rhinorrhea, sinus pressure, sinus pain, sore throat, trouble swallowing and voice change.   Respiratory: Positive for cough (yellow sputum), chest tightness and wheezing. Negative for shortness of breath.   Cardiovascular: Negative for chest pain.  Gastrointestinal: Negative for abdominal pain, constipation, diarrhea, nausea and vomiting.  Musculoskeletal: Positive for myalgias.  Neurological: Positive for headaches.    Vital Signs: Temp 98.4 F (36.9 C)   Ht 5\' 5"  (1.651 m)   Wt 196 lb (88.9 kg)   BMI 32.62 kg/m    Observation/Objective: Rose Clements is alert and oriented, engages appropriately in  conversation. Her voice is low, hoarse and raspy with a persistent wet cough.     Assessment/Plan: 1. Acute non-recurrent frontal sinusitis Acute sinusitis, augmentin prescribed. Patient encouraged to take OTC cough medicine of her choice. Patient instructed to call the clinic or send a mychart message if OTC cough medicine is not helping, and something can be prescribed for her.  - amoxicillin-clavulanate (AUGMENTIN) 875-125 MG tablet; Take 1 tablet by mouth 2 (two) times daily.  Dispense: 20 tablet; Refill: 0  2. Laryngitis, acute Patient is hoarse and losing her voice. The majority of her symptoms are sinusitis related. Antibiotic prescribed for sinusitis may help improve laryngitis as well.     General Counseling: Rose Clements verbalizes understanding of the findings of today's phone visit and agrees with plan of treatment. I have discussed any further diagnostic evaluation that may be needed or ordered today. We also reviewed her medications today. she has been encouraged to call the office with any questions or concerns that should arise related to todays visit.    No orders of the defined types were placed in this encounter.   Meds ordered this  encounter  Medications  . amoxicillin-clavulanate (AUGMENTIN) 875-125 MG tablet    Sig: Take 1 tablet by mouth 2 (two) times daily.    Dispense:  20 tablet    Refill:  0   Return if symptoms worsen or fail to improve.  Time spent:30 Minutes  This patient was seen by Sallyanne Kuster, FNP-C in collaboration with Dr. Beverely Risen as a part of collaborative care agreement.    Meygan Kyser R. Tedd Sias, MSN, FNP-C Internal medicine

## 2021-05-13 NOTE — Progress Notes (Signed)
Chronic Care Management Pharmacy Note  05/13/2021 Name:  Rose Clements MRN:  [id number] DOB:  [birth year]  Summary: Initial visit with pharmD medication review.  Recommendations/Changes made from today's visit: No changes at this time Recommend follow up lipid panel  Plan: Follow up in 4 months Depending on lipid panel would recommend statin therapy   Subjective: Rose Clements is an 60 y.o. year old female who is a primary patient of Rose Clements, Rose Gaul, MD.  The CCM team was consulted for assistance with disease management and care coordination needs.    Engaged with patient face to face for initial visit in response to provider referral for pharmacy case management and/or care coordination services.   Consent to Services:  The patient was given the following information about Chronic Care Management services today, agreed to services, and gave verbal consent: 1. CCM service includes personalized support from designated clinical staff supervised by the primary care provider, including individualized plan of care and coordination with other care providers 2. 24/7 contact phone numbers for assistance for urgent and routine care needs. 3. Service will only be billed when office clinical staff spend 20 minutes or more in a month to coordinate care. 4. Only one practitioner may furnish and bill the service in a calendar month. 5.The patient may stop CCM services at any time (effective at the end of the month) by phone call to the office staff. 6. The patient will be responsible for cost sharing (co-pay) of up to 20% of the service fee (after annual deductible is met). Patient agreed to services and consent obtained.  Patient Care Team: Rose Guise, MD as PCP - General (Internal Medicine) Rose Living, MD as Referring Physician (Neurology) Rose Clements, [facility] as Pharmacist (Pharmacist)  Recent office visits:  05/12/21 Rose Osgood, NP. For acute frontal sinusitis. STARTED  Amoxicillin-Pot-Clavulanate 875-125 MG 1 tablet 2 times daily.  05/01/21 Rose Guise, MD. For follow-up. Per note:We will increase her Zoloft to 100 mg once a day and patient was also instructed to increase 125 if her depression symptoms are not 100% under control. 03/03/21 McDonough, Si Gaul, PA-C. For general adult medical examination. INCREASED Sertraline to 100 mg daily. STOPPED Duloxetine.   01/02/21 Rose Ochoa, NP. For follow-up. STARTED Sertraline 50 mg daily.    Recent consult visits:  05/12/21 Neurology Sater, Nanine Means, MD. For follow-up. STARTED Imipramine HCL 25 mg daily at bedtime.  04/04/21 Neurosurgery Parente, Gordy Clement, PA  For leg pain. Per note:We will increase [name] intrathecal baclofen a small amount today. 03/10/21 Urology MacDiarmid, Nicki Reaper, MD. For mixed incontinence. STOPPED Mirabegron.  12/11/20 Neurology Sater, Rose A. Per note: IV infusion was given. No information given about medications.   Hospital visits:  None in previous 6 months   Objective:  Lab Results  Component Value Date   CREATININE 0.96 02/07/2020   BUN 9 02/07/2020   GFRNONAA 66 02/07/2020   GFRAA 76 02/07/2020   NA 143 02/07/2020   K 4.6 02/07/2020   CALCIUM 9.4 02/07/2020   CO2 20 02/07/2020   GLUCOSE 87 02/07/2020    No results found for: HGBA1C, FRUCTOSAMINE, GFR, MICROALBUR  Last diabetic Eye exam: No results found for: HMDIABEYEEXA  Last diabetic Foot exam: No results found for: HMDIABFOOTEX   Lab Results  Component Value Date   CHOL 222 (H) 02/07/2020   HDL 66 02/07/2020   LDLCALC 144 (H) 02/07/2020   TRIG 71 02/07/2020    Hepatic Function Latest  Ref Rng & Units 02/07/2020 02/15/2019 06/22/2018  Total Protein 6.0 - 8.5 g/dL 6.8 7.0 6.8  Albumin 3.8 - 4.9 g/dL 4.4 4.1 4.3  AST 0 - 40 IU/L _0 ALT 0 - 32 IU/L _1 Alk Phosphatase 39 - 117 IU/L 88 73 87  Total Bilirubin 0.0 - 1.2 mg/dL 0.5 0.6 0.5    Lab Results  Component Value Date/Time   TSH 1.350 02/07/2020  10:50 AM   TSH 1.590 06/22/2018 01:45 PM   FREET4 0.98 02/07/2020 10:50 AM   FREET4 1.15 06/22/2018 01:45 PM    CBC Latest Ref Rng & Units 11/11/2020 05/23/2020 03/07/2020  WBC 3.4 - 10.8 x10E3/uL 6.8 7.7 7.5  Hemoglobin 11.1 - 15.9 g/dL 11.2 12.2 12.0  Hematocrit 34.0 - 46.6 % 35.1 36.7 36.3  Platelets 150 - 450 x10E3/uL 262 301 288    No results found for: VD25OH  Clinical ASCVD: No  The 10-year ASCVD risk score Mikey Bussing DC Jr., et al., 2013) is: 4.7%   Values used to calculate the score:     Age: 49 years     Sex: Female     Is Non-Hispanic African American: Yes     Diabetic: No     Tobacco smoker: No     Systolic Blood Pressure: 213 mmHg     Is BP treated: No     HDL Cholesterol: 66 mg/dL     Total Cholesterol: 222 mg/dL    Depression screen [facility] 2/9 03/03/2021 01/02/2021 10/25/2020  Decreased Interest 1 1 0  Down, Depressed, Hopeless 0 1 0  PHQ - 2 Score 1 2 0  Altered sleeping - 1 -  Tired, decreased energy - 1 -  Change in appetite - 0 -  Feeling bad or failure about yourself  - 1 -  Trouble concentrating - 1 -  Moving slowly or fidgety/restless - 0 -  Suicidal thoughts - 0 -  PHQ-9 Score - 6 -      Social History   Tobacco Use  Smoking Status Never Smoker  Smokeless Tobacco Never Used   BP Readings from Last 3 Encounters:  05/01/21 133/80  03/10/21 117/76  03/03/21 122/78   Pulse Readings from Last 3 Encounters:  05/01/21 78  03/10/21 64  03/03/21 83   Wt Readings from Last 3 Encounters:  05/12/21 196 lb (88.9 kg)  05/01/21 194 lb 12.8 oz (88.4 kg)  03/10/21 194 lb (88 kg)   BMI Readings from Last 3 Encounters:  05/12/21 32.62 kg/m  05/01/21 32.42 kg/m  03/10/21 32.28 kg/m    Assessment/Interventions: Review of patient past medical history, allergies, medications, health status, including review of consultants reports, laboratory and other test data, was performed as part of comprehensive evaluation and provision of chronic care management  services.   SDOH:  (Social Determinants of Health) assessments and interventions performed: Yes  Financial Resource Strain: Not on file    SDOH Screenings   Alcohol Screen: Not on file  Depression (PHQ2-9): Low Risk    PHQ-2 Score: 1  Financial Resource Strain: Not on file  Food Insecurity: Not on file  Housing: Not on file  Physical Activity: Not on file  Social Connections: Not on file  Stress: Not on file  Tobacco Use: Low Risk    Smoking Tobacco Use: Never Smoker   Smokeless Tobacco Use: Never Used  Transportation Needs: Not on file    CCM Care Plan  Allergies  Allergen Reactions   Lisinopril  Hives    Medications Reviewed Today     Reviewed by Britt Bottom, MD (Physician) on 05/12/21 at 1224  Med List Status: <None>   Medication Order Taking? Sig Documenting Provider Last Dose Status Informant  acetaminophen (TYLENOL) 500 MG tablet [id number] No Take 500 mg by mouth every 6 (six) hours as needed (for pain.). [provider] Taking Active Self  amoxicillin-clavulanate (AUGMENTIN) 875-125 MG tablet [id number]  Take 1 tablet by mouth 2 (two) times daily. Rose Osgood, NP  Active   baclofen (LIORESAL) 10 MG tablet [id number] No Take 10 mg by mouth See admin instructions. Take 1 tablet (10 mg) by mouth scheduled in the morning & evening, may take an additional dose at bedtime if needed for spasms. [provider] Taking Active Self  cetirizine (ZYRTEC) 10 MG tablet [id number] No Take 10 mg by mouth daily. [provider] Taking Active Self  Cholecalciferol (VITAMIN D3) 1000 UNITS CAPS [id number] No Take 1,000 Units by mouth in the morning and at bedtime.  [provider] Taking Active Self  fluticasone (FLONASE) 50 MCG/ACT nasal spray [id number] No SPRAY 2 SPRAYS INTO EACH NOSTRIL EVERY DAY Rose Guise, MD Taking Active   imipramine (TOFRANIL) 25 MG tablet [id number] Yes Take 1 tablet (25 mg total) by mouth at bedtime. Sater, Nanine Means,  MD  Active   mirabegron ER (MYRBETRIQ) 50 MG TB24 tablet [id number] No Take 50 mg by mouth daily. [provider] Taking Active   natalizumab (TYSABRI) 300 MG/15ML injection [id number] No Inject 300 mg into the vein every 30 (thirty) days.  [provider] Taking Active Self           Med Note Ronnald Ramp, EMMA L   Mon Nov 18, 2020  7:54 AM) JCV ab drawn on 11/11/20 negative, index: 0.15   pantoprazole (PROTONIX) 40 MG tablet [id number] No TAKE 1 TABLET BY MOUTH EVERY DAY Rose Guise, MD Taking Active   Polyethylene Glycol POWD [id number] No Take 17 g by mouth daily.  Patient taking differently: Take 17 g by mouth daily as needed (constipation.).   Rose Guise, MD Taking Active   rizatriptan (MAXALT-MLT) 10 MG disintegrating tablet [id number] No Take 1 tablet (10 mg total) by mouth as needed for migraine. May repeat in 2 hours if needed Sater, Nanine Means, MD Taking Active   sertraline (ZOLOFT) 100 MG tablet [id number] No Take 1 tablet (100 mg total) by mouth daily. McDonough, Si Gaul, PA-C Taking Active   topiramate (TOPAMAX) 50 MG tablet [id number] No Take 1 tablet (50 mg total) by mouth 2 (two) times daily. Britt Bottom, MD Taking Active             Patient Active Problem List   Diagnosis Date Noted   Multiple thyroid nodules 11/23/2020   Migraine without aura and without status migrainosus, not intractable 11/11/2020   Special screening for malignant neoplasm of intestine    High risk medication use 05/23/2020   Leg weakness 11/21/2019   Spasticity 06/01/2017   Problems with swallowing and mastication    Stricture and stenosis of esophagus    Urge incontinence 10/17/2015   Urgency of micturation 10/17/2015   Anxiety state 08/16/2015   Multiple sclerosis ([place]) 07/16/2015   Major depressive disorder, recurrent episode, moderate ([place]) 07/01/2015   Gastroesophageal reflux disease with esophagitis 06/14/2015   DS (disseminated sclerosis) ([place]) 05/24/2015   BP (high  blood pressure) 05/24/2015   Acid reflux 05/24/2015   Esophageal dysphagia 05/24/2015  CN (constipation) 05/24/2015   Bladder infection, chronic 05/24/2015   Narrowing of intervertebral disc space 05/24/2015   Clinical depression 05/24/2015   Other fatigue 12/24/2014   Difficulty in walking 12/24/2014   Can't get food down 12/24/2014   Insomnia 12/24/2014   Amnesia 12/24/2014   Difficulty in walking, not elsewhere classified 47/08/6282   Eosinophilic esophagitis 66/29/4765    Immunization History  Administered Date(s) Administered   Influenza Inj Mdck Quad Pf 10/15/2018, 09/20/2020   Influenza, Quadrivalent, Recombinant, Inj, Pf 09/26/2019   Influenza, Seasonal, Injecte, Preservative Fre 10/15/2018   Influenza,inj,Quad PF,6+ Mos 10/14/2015   Influenza-Unspecified 09/23/2019   Moderna Sars-Covid-2 Vaccination 02/28/2020, 03/27/2020   PFIZER(Purple Top)SARS-COV-2 Vaccination 11/16/2020    Conditions to be addressed/monitored:  GERD, Migraines, Depression/Anxiety, Insomnia, Urge incontinence  There are no care plans that you recently modified to display for this patient.    Medication Assistance: None required.  Patient affirms current coverage meets needs.   Patient's preferred pharmacy is:  [id number] [address], [place] [address] Phone: [phone] Fax: [place], [place] [address] Phone: [phone] Fax: [phone]  [id number] [place], [place] [address] [place]Phone: [phone]: [phone] Uses pill box? Yes Pt endorses 100% compliance  We discussed: Benefits of medication synchronization, packaging and delivery as well as enhanced pharmacist oversight with [employer]. Patient decided to: Continue  current medication management strategy  Care Plan and Follow Up Patient Decision:  Patient agrees to Care Plan and Follow-up.  Plan: The care management team will reach out to the patient again over the next 120 days.  CBeverly Milch PharmD Clinical Pharmacist NPark Place Surgical Hospital([phone]

## 2021-05-14 ENCOUNTER — Ambulatory Visit: Payer: Medicare Other | Admitting: Pharmacist

## 2021-05-14 DIAGNOSIS — K2100 Gastro-esophageal reflux disease with esophagitis, without bleeding: Secondary | ICD-10-CM

## 2021-05-14 DIAGNOSIS — F331 Major depressive disorder, recurrent, moderate: Secondary | ICD-10-CM

## 2021-05-14 DIAGNOSIS — G4700 Insomnia, unspecified: Secondary | ICD-10-CM

## 2021-05-14 DIAGNOSIS — G43009 Migraine without aura, not intractable, without status migrainosus: Secondary | ICD-10-CM

## 2021-05-14 DIAGNOSIS — N3941 Urge incontinence: Secondary | ICD-10-CM

## 2021-05-15 NOTE — Patient Instructions (Addendum)
Visit Information   Goals Addressed             This Visit's Progress    Track and Manage My Symptoms-Depression       Timeframe:  Long-Range Goal Priority:  High Start Date:  05/14/21                           Expected End Date:    11/13/21                   Follow Up Date 08/14/21    - exercise at least 2 to 3 times per week - have a plan for how to handle bad days - spend time or talk with others every day - watch for early signs of feeling worse    Why is this important?   Keeping track of your progress will help your treatment team find the right mix of medicine and therapy for you.  Write in your journal every day.  Day-to-day changes in depression symptoms are normal. It may be more helpful to check your progress at the end of each week instead of every day.     Notes:         Patient Care Plan: General Pharmacy (Adult)     Problem Identified: GERD, Migraines, Depression/Anxiety, Insomnia, Urge incontinence   Priority: High  Onset Date: 05/14/2021     Long-Range Goal: Patient-Specific Goal   Start Date: 05/14/2021  Expected End Date: 11/14/2021  This Visit's Progress: On track  Priority: High  Note:   urrent Barriers:  None identified at this time  Pharmacist Clinical Goal(s):  Patient will verbalize ability to afford treatment regimen achieve adherence to monitoring guidelines and medication adherence to achieve therapeutic efficacy adhere to plan to optimize therapeutic regimen for Depression as evidenced by report of adherence to recommended medication management changes contact provider office for questions/concerns as evidenced notation of same in electronic health record through collaboration with PharmD and provider.   Interventions: 1:1 collaboration with Lyndon Code, MD regarding development and update of comprehensive plan of care as evidenced by provider attestation and co-signature Inter-disciplinary care team collaboration (see longitudinal  plan of care) Comprehensive medication review performed; medication list updated in electronic medical record  Depression/Anxiety (Goal: Control symptoms) -Controlled -Current treatment: Sertraline 100mg  daily -Medications previously tried/failed: amitriptyline, Cymbalta -Educated on Benefits of medication for symptom control -Reports mood is controlled, does not feel like she needs increased dose at this time -Recommended to continue current medication  Migraines (Goal: Control symptoms) -Controlled -Current treatment  Maxalt 10mg  ODT Topiramate 50mg  twice daily -Medications previously tried: none noted -Denies any recent migraines -Takes medication appropriately  -Recommended to continue current medication  Insomnia (Goal: Adequate sleep habits) -Controlled -Current treatment  Imipramine 25mg  hs -Medications previously tried: Ambien -Recently started medication, tolerating it fine -Does state she feels like it has helped her sleep  -Recommended to continue current medication  Urge Incontinence (Goal: Contorl symptoms) -Not ideally controlled -Current treatment  Myrbetriq 50mg  daily -Medications previously tried: Vesicare, oxybutynin -Does not feel like medication is helping, still taking samples -Recently started on Imipramine, which should theoretically help with this as well  -Recommended to continue current medication Recommended she contact me if she has to start paying for this and copay is expensive.  GERD(Goal: Control/minimize symptoms) -Controlled -Current treatment  Pantoprazole 40mg  daily -Medications previously tried: none noted -Denies any recent acid reflux, taking medication appropriately  -  Recommended to continue current medication Counseled on watching trigger foods   Patient Goals/Self-Care Activities Patient will:  - take medications as prescribed target a minimum of 150 minutes of moderate intensity exercise weekly engage in dietary  modifications by watching trigger foods for GERD.  Follow Up Plan: The care management team will reach out to the patient again over the next 120 days.       Ms. Tusing was given information about Chronic Care Management services today including:  CCM service includes personalized support from designated clinical staff supervised by her physician, including individualized plan of care and coordination with other care providers 24/7 contact phone numbers for assistance for urgent and routine care needs. Standard insurance, coinsurance, copays and deductibles apply for chronic care management only during months in which we provide at least 20 minutes of these services. Most insurances cover these services at 100%, however patients may be responsible for any copay, coinsurance and/or deductible if applicable. This service may help you avoid the need for more expensive face-to-face services. Only one practitioner may furnish and bill the service in a calendar month. The patient may stop CCM services at any time (effective at the end of the month) by phone call to the office staff.  Patient agreed to services and verbal consent obtained.   The patient verbalized understanding of instructions, educational materials, and care plan provided today and declined offer to receive copy of patient instructions, educational materials, and care plan.  Telephone follow up appointment with pharmacy team member scheduled for: 4 months  Erroll Luna, [place]  [phone]

## 2021-05-28 ENCOUNTER — Other Ambulatory Visit: Payer: Self-pay

## 2021-05-28 ENCOUNTER — Telehealth: Payer: Self-pay | Admitting: *Deleted

## 2021-05-28 ENCOUNTER — Other Ambulatory Visit: Payer: Self-pay | Admitting: Physician Assistant

## 2021-05-28 DIAGNOSIS — G35 Multiple sclerosis: Secondary | ICD-10-CM | POA: Diagnosis not present

## 2021-05-28 DIAGNOSIS — Z79899 Other long term (current) drug therapy: Secondary | ICD-10-CM | POA: Diagnosis not present

## 2021-05-28 DIAGNOSIS — R300 Dysuria: Secondary | ICD-10-CM

## 2021-05-28 DIAGNOSIS — G43009 Migraine without aura, not intractable, without status migrainosus: Secondary | ICD-10-CM

## 2021-05-28 DIAGNOSIS — R5383 Other fatigue: Secondary | ICD-10-CM

## 2021-05-28 DIAGNOSIS — Z1231 Encounter for screening mammogram for malignant neoplasm of breast: Secondary | ICD-10-CM

## 2021-05-28 DIAGNOSIS — N3946 Mixed incontinence: Secondary | ICD-10-CM

## 2021-05-28 DIAGNOSIS — Z0001 Encounter for general adult medical examination with abnormal findings: Secondary | ICD-10-CM

## 2021-05-28 DIAGNOSIS — F331 Major depressive disorder, recurrent, moderate: Secondary | ICD-10-CM

## 2021-05-28 DIAGNOSIS — E782 Mixed hyperlipidemia: Secondary | ICD-10-CM

## 2021-05-28 DIAGNOSIS — I10 Essential (primary) hypertension: Secondary | ICD-10-CM

## 2021-05-28 NOTE — Telephone Encounter (Signed)
Placed JCV lab in [facility] lock box for routine lab pick up. Results pending. 

## 2021-05-29 LAB — CBC WITH DIFFERENTIAL/PLATELET
Basophils Absolute: 0.1 10*3/uL (ref 0.0–0.2)
Basos: 1 %
EOS (ABSOLUTE): 0.2 10*3/uL (ref 0.0–0.4)
Eos: 3 %
Hematocrit: 34 % (ref 34.0–46.6)
Hemoglobin: 11.1 g/dL (ref 11.1–15.9)
Immature Grans (Abs): 0 10*3/uL (ref 0.0–0.1)
Immature Granulocytes: 0 %
Lymphocytes Absolute: 4.4 10*3/uL — ABNORMAL HIGH (ref 0.7–3.1)
Lymphs: 65 %
MCH: 29.7 pg (ref 26.6–33.0)
MCHC: 32.6 g/dL (ref 31.5–35.7)
MCV: 91 fL (ref 79–97)
Monocytes Absolute: 0.5 10*3/uL (ref 0.1–0.9)
Monocytes: 7 %
Neutrophils Absolute: 1.6 10*3/uL (ref 1.4–7.0)
Neutrophils: 24 %
Platelets: 307 10*3/uL (ref 150–450)
RBC: 3.74 x10E6/uL — ABNORMAL LOW (ref 3.77–5.28)
RDW: 12.5 % (ref 11.7–15.4)
WBC: 6.8 10*3/uL (ref 3.4–10.8)

## 2021-06-04 NOTE — Telephone Encounter (Signed)
JCV ab drawn on 05/28/21 negative, index: 0.18.

## 2021-06-05 DIAGNOSIS — K2100 Gastro-esophageal reflux disease with esophagitis, without bleeding: Secondary | ICD-10-CM | POA: Diagnosis not present

## 2021-06-05 DIAGNOSIS — I10 Essential (primary) hypertension: Secondary | ICD-10-CM | POA: Diagnosis not present

## 2021-06-11 ENCOUNTER — Other Ambulatory Visit: Payer: Self-pay | Admitting: Internal Medicine

## 2021-06-11 DIAGNOSIS — K219 Gastro-esophageal reflux disease without esophagitis: Secondary | ICD-10-CM

## 2021-06-13 ENCOUNTER — Ambulatory Visit: Payer: Medicare Other | Admitting: Nurse Practitioner

## 2021-06-25 DIAGNOSIS — G35 Multiple sclerosis: Secondary | ICD-10-CM | POA: Diagnosis not present

## 2021-07-18 ENCOUNTER — Telehealth: Payer: Self-pay | Admitting: Pharmacist

## 2021-07-18 NOTE — Progress Notes (Addendum)
    Chronic Care Management Pharmacy Assistant   Name: Rose Clements  MRN: [id number] DOB: 29-Jan-1961  Reason for Encounter: General Disease State Call   Conditions to be addressed/monitored: GERD, Migraines, Depression/Anxiety, Insomnia, Urge incontinence  Recent office visits:  None since 05/14/21  Recent consult visits:  None since 05/14/21  Hospital visits:  None since 05/14/21  Medications: Outpatient Encounter Medications as of 07/18/2021  Medication Sig Note   acetaminophen (TYLENOL) 500 MG tablet Take 500 mg by mouth every 6 (six) hours as needed (for pain.).    amoxicillin-clavulanate (AUGMENTIN) 875-125 MG tablet Take 1 tablet by mouth 2 (two) times daily.    baclofen (LIORESAL) 10 MG tablet Take 10 mg by mouth See admin instructions. Take 1 tablet (10 mg) by mouth scheduled in the morning & evening, may take an additional dose at bedtime if needed for spasms.    cetirizine (ZYRTEC) 10 MG tablet Take 10 mg by mouth daily.    Cholecalciferol (VITAMIN D3) 1000 UNITS CAPS Take 1,000 Units by mouth in the morning and at bedtime.     fluticasone (FLONASE) 50 MCG/ACT nasal spray SPRAY 2 SPRAYS INTO EACH NOSTRIL EVERY DAY    imipramine (TOFRANIL) 25 MG tablet Take 1 tablet (25 mg total) by mouth at bedtime.    mirabegron ER (MYRBETRIQ) 50 MG TB24 tablet Take 50 mg by mouth daily.    natalizumab (TYSABRI) 300 MG/15ML injection Inject 300 mg into the vein every 30 (thirty) days.  11/18/2020: JCV ab drawn on 11/11/20 negative, index: 0.15    pantoprazole (PROTONIX) 40 MG tablet TAKE 1 TABLET BY MOUTH EVERY DAY    Polyethylene Glycol POWD Take 17 g by mouth daily. (Patient taking differently: Take 17 g by mouth daily as needed (constipation.).)    rizatriptan (MAXALT-MLT) 10 MG disintegrating tablet Take 1 tablet (10 mg total) by mouth as needed for migraine. May repeat in 2 hours if needed    sertraline (ZOLOFT) 100 MG tablet TAKE 1 TABLET(100 MG) BY MOUTH DAILY    topiramate (TOPAMAX) 50  MG tablet Take 1 tablet (50 mg total) by mouth 2 (two) times daily.    No facility-administered encounter medications on file as of 07/18/2021.   GEN CALL:  Third unsuccessful telephone outreach was attempted today. The patient was referred to the pharmacist for assistance with care management and care coordination.   Star Rating Drugs: N/A.  Follow-Up:Pharmacist Review   Hulen Luster, RMA Clinical Pharmacist Assistant [phone]

## 2021-08-20 DIAGNOSIS — G35 Multiple sclerosis: Secondary | ICD-10-CM | POA: Diagnosis not present

## 2021-09-15 NOTE — Progress Notes (Deleted)
Chronic Care Management Pharmacy Note  09/15/2021 Name:  Rose Clements MRN:  [id number] DOB:  1961-04-16  Summary: Initial visit with pharmD medication review.  Recommendations/Changes made from today's visit: No changes at this time Recommend follow up lipid panel  Plan: Follow up in 4 months Depending on lipid panel would recommend statin therapy   Subjective: Rose Clements is an 60 y.o. year old female who is a primary patient of Humphrey Rolls, Timoteo Gaul, MD.  The CCM team was consulted for assistance with disease management and care coordination needs.    Engaged with patient face to face for initial visit in response to provider referral for pharmacy case management and/or care coordination services.   Consent to Services:  The patient was given the following information about Chronic Care Management services today, agreed to services, and gave verbal consent: 1. CCM service includes personalized support from designated clinical staff supervised by the primary care provider, including individualized plan of care and coordination with other care providers 2. 24/7 contact phone numbers for assistance for urgent and routine care needs. 3. Service will only be billed when office clinical staff spend 20 minutes or more in a month to coordinate care. 4. Only one practitioner may furnish and bill the service in a calendar month. 5.The patient may stop CCM services at any time (effective at the end of the month) by phone call to the office staff. 6. The patient will be responsible for cost sharing (co-pay) of up to 20% of the service fee (after annual deductible is met). Patient agreed to services and consent obtained.  Patient Care Team: Lavera Guise, MD as PCP - General (Internal Medicine) Concepcion Living, MD as Referring Physician (Neurology) Edythe Clarity, [facility] as Pharmacist (Pharmacist)  Recent office visits:  None since 05/14/21   Recent consult visits:  None since 05/14/21    Hospital visits:  None since 05/14/21  Objective:  Lab Results  Component Value Date   CREATININE 0.96 02/07/2020   BUN 9 02/07/2020   GFRNONAA 66 02/07/2020   GFRAA 76 02/07/2020   NA 143 02/07/2020   K 4.6 02/07/2020   CALCIUM 9.4 02/07/2020   CO2 20 02/07/2020   GLUCOSE 87 02/07/2020    No results found for: HGBA1C, FRUCTOSAMINE, GFR, MICROALBUR  Last diabetic Eye exam: No results found for: HMDIABEYEEXA  Last diabetic Foot exam: No results found for: HMDIABFOOTEX   Lab Results  Component Value Date   CHOL 222 (H) 02/07/2020   HDL 66 02/07/2020   LDLCALC 144 (H) 02/07/2020   TRIG 71 02/07/2020    Hepatic Function Latest Ref Rng & Units 02/07/2020 02/15/2019 06/22/2018  Total Protein 6.0 - 8.5 g/dL 6.8 7.0 6.8  Albumin 3.8 - 4.9 g/dL 4.4 4.1 4.3  AST 0 - 40 IU/L '14 15 16  ' ALT 0 - 32 IU/L '5 9 7  ' Alk Phosphatase 39 - 117 IU/L 88 73 87  Total Bilirubin 0.0 - 1.2 mg/dL 0.5 0.6 0.5    Lab Results  Component Value Date/Time   TSH 1.350 02/07/2020 10:50 AM   TSH 1.590 06/22/2018 01:45 PM   FREET4 0.98 02/07/2020 10:50 AM   FREET4 1.15 06/22/2018 01:45 PM    CBC Latest Ref Rng & Units 05/28/2021 11/11/2020 05/23/2020  WBC 3.4 - 10.8 x10E3/uL 6.8 6.8 7.7  Hemoglobin 11.1 - 15.9 g/dL 11.1 11.2 12.2  Hematocrit 34.0 - 46.6 % 34.0 35.1 36.7  Platelets 150 - 450 x10E3/uL 307 262 301  No results found for: VD25OH  Clinical ASCVD: No  The 10-year ASCVD risk score (Arnett DK, et al., 2019) is: 5.2%   Values used to calculate the score:     Age: 36 years     Sex: Female     Is Non-Hispanic African American: Yes     Diabetic: No     Tobacco smoker: No     Systolic Blood Pressure: 382 mmHg     Is BP treated: No     HDL Cholesterol: 66 mg/dL     Total Cholesterol: 222 mg/dL    Depression screen [facility] 2/9 03/03/2021 01/02/2021 10/25/2020  Decreased Interest 1 1 0  Down, Depressed, Hopeless 0 1 0  PHQ - 2 Score 1 2 0  Altered sleeping - 1 -  Tired, decreased energy - 1 -   Change in appetite - 0 -  Feeling bad or failure about yourself  - 1 -  Trouble concentrating - 1 -  Moving slowly or fidgety/restless - 0 -  Suicidal thoughts - 0 -  PHQ-9 Score - 6 -      Social History   Tobacco Use  Smoking Status Never  Smokeless Tobacco Never   BP Readings from Last 3 Encounters:  05/01/21 133/80  03/10/21 117/76  03/03/21 122/78   Pulse Readings from Last 3 Encounters:  05/01/21 78  03/10/21 64  03/03/21 83   Wt Readings from Last 3 Encounters:  05/12/21 196 lb (88.9 kg)  05/01/21 194 lb 12.8 oz (88.4 kg)  03/10/21 194 lb (88 kg)   BMI Readings from Last 3 Encounters:  05/12/21 32.62 kg/m  05/01/21 32.42 kg/m  03/10/21 32.28 kg/m    Assessment/Interventions: Review of patient past medical history, allergies, medications, health status, including review of consultants reports, laboratory and other test data, was performed as part of comprehensive evaluation and provision of chronic care management services.   SDOH:  (Social Determinants of Health) assessments and interventions performed: Yes  Financial Resource Strain: Low Risk    Difficulty of Paying Living Expenses: Not very hard    SDOH Screenings   Alcohol Screen: Not on file  Depression (PHQ2-9): Low Risk    PHQ-2 Score: 1  Financial Resource Strain: Low Risk    Difficulty of Paying Living Expenses: Not very hard  Food Insecurity: Not on file  Housing: Not on file  Physical Activity: Not on file  Social Connections: Not on file  Stress: Not on file  Tobacco Use: Low Risk    Smoking Tobacco Use: Never   Smokeless Tobacco Use: Never  Transportation Needs: Not on file    Joice  Allergies  Allergen Reactions   Lisinopril Hives    Medications Reviewed Today     Reviewed by Edythe Clarity, [facility] (Pharmacist) on 05/15/21 at 1023  Med List Status: <None>   Medication Order Taking? Sig Documenting Provider Last Dose Status Informant  acetaminophen (TYLENOL)  500 MG tablet [id number] Yes Take 500 mg by mouth every 6 (six) hours as needed (for pain.). [provider] Taking Active Self  amoxicillin-clavulanate (AUGMENTIN) 875-125 MG tablet [id number] Yes Take 1 tablet by mouth 2 (two) times daily. Jonetta Osgood, NP Taking Active   baclofen (LIORESAL) 10 MG tablet [id number] Yes Take 10 mg by mouth See admin instructions. Take 1 tablet (10 mg) by mouth scheduled in the morning & evening, may take an additional dose at bedtime if needed for spasms. [provider] Taking Active Self  cetirizine (  ZYRTEC) 10 MG tablet [id number] Yes Take 10 mg by mouth daily. [provider] Taking Active Self  Cholecalciferol (VITAMIN D3) 1000 UNITS CAPS [id number] Yes Take 1,000 Units by mouth in the morning and at bedtime.  [provider] Taking Active Self  fluticasone (FLONASE) 50 MCG/ACT nasal spray [id number] Yes SPRAY 2 SPRAYS INTO EACH NOSTRIL EVERY DAY Lavera Guise, MD Taking Active   imipramine (TOFRANIL) 25 MG tablet [id number] Yes Take 1 tablet (25 mg total) by mouth at bedtime. Sater, Nanine Means, MD Taking Active   mirabegron ER [facility]) 50 MG TB24 tablet [id number] Yes Take 50 mg by mouth daily. [provider] Taking Active   natalizumab (TYSABRI) 300 MG/15ML injection [id number] Yes Inject 300 mg into the vein every 30 (thirty) days.  [provider] Taking Active Self           Med Note Ronnald Ramp, EMMA L   Mon Nov 18, 2020  7:54 AM) JCV ab drawn on 11/11/20 negative, index: 0.15   pantoprazole (PROTONIX) 40 MG tablet [id number] Yes TAKE 1 TABLET BY MOUTH EVERY DAY Lavera Guise, MD Taking Active   Polyethylene Glycol POWD [id number] Yes Take 17 g by mouth daily.  Patient taking differently: Take 17 g by mouth daily as needed (constipation.).   Lavera Guise, MD Taking Active   rizatriptan (MAXALT-MLT) 10 MG disintegrating tablet [id number] Yes Take 1 tablet (10 mg total) by mouth as needed for migraine. May  repeat in 2 hours if needed Sater, Nanine Means, MD Taking Active   sertraline (ZOLOFT) 100 MG tablet [id number] Yes Take 1 tablet (100 mg total) by mouth daily. McDonough, Si Gaul, PA-C Taking Active   topiramate (TOPAMAX) 50 MG tablet [id number] Yes Take 1 tablet (50 mg total) by mouth 2 (two) times daily. Britt Bottom, MD Taking Active             Patient Active Problem List   Diagnosis Date Noted   Multiple thyroid nodules 11/23/2020   Migraine without aura and without status migrainosus, not intractable 11/11/2020   Special screening for malignant neoplasm of intestine    High risk medication use 05/23/2020   Leg weakness 11/21/2019   Spasticity 06/01/2017   Problems with swallowing and mastication    Stricture and stenosis of esophagus    Urge incontinence 10/17/2015   Urgency of micturation 10/17/2015   Anxiety state 08/16/2015   Multiple sclerosis ([place]) 07/16/2015   Major depressive disorder, recurrent episode, moderate ([place]) 07/01/2015   Gastroesophageal reflux disease with esophagitis 06/14/2015   DS (disseminated sclerosis) ([place]) 05/24/2015   BP (high blood pressure) 05/24/2015   Acid reflux 05/24/2015   Esophageal dysphagia 05/24/2015   CN (constipation) 05/24/2015   Bladder infection, chronic 05/24/2015   Narrowing of intervertebral disc space 05/24/2015   Clinical depression 05/24/2015   Other fatigue 12/24/2014   Difficulty in walking 12/24/2014   Can't get food down 12/24/2014   Insomnia 12/24/2014   Amnesia 12/24/2014   Difficulty in walking, not elsewhere classified 15/04/6978   Eosinophilic esophagitis 48/12/6551    Immunization History  Administered Date(s) Administered   Influenza Inj Mdck Quad Pf 10/15/2018, 09/20/2020   Influenza, Quadrivalent, Recombinant, Inj, Pf 09/26/2019   Influenza, Seasonal, Injecte, Preservative Fre 10/15/2018   Influenza,inj,Quad PF,6+ Mos 10/14/2015   Influenza-Unspecified 09/23/2019   Moderna Sars-Covid-2 Vaccination  02/28/2020, 03/27/2020   PFIZER(Purple Top)SARS-COV-2 Vaccination 11/16/2020    Conditions to be addressed/monitored:  GERD, Migraines, Depression/Anxiety, Insomnia, Urge incontinence  There are  no care plans that you recently modified to display for this patient.    Medication Assistance: None required.  Patient affirms current coverage meets needs.   Patient's preferred pharmacy is:  [id number] [address], [place] [address] Phone: [phone] Fax: [phone]  [place] [id number], [place] [address] [place] [address] Phone: [phone] Fax: [phone]  [id number] [place] [id number]: [phone]: [phone] Uses pill box? Yes Pt endorses 100% compliance  We discussed: Benefits of medication synchronization, packaging and delivery as well as enhanced pharmacist oversight with [employer]. Patient decided to: Continue current medication management strategy  Care Plan and Follow Up Patient Decision:  Patient agrees to Care Plan and Follow-up.  Plan: The care management team will reach out to the patient again over the next 120 days.  CBeverly Milch PharmD Clinical Pharmacist [phone]  Current Barriers:  None identified at this time  Pharmacist Clinical Goal(s):  Patient will verbalize ability to afford treatment regimen achieve adherence to monitoring guidelines and medication adherence to achieve therapeutic efficacy adhere to plan to optimize therapeutic regimen for Depression as evidenced by report of adherence to recommended medication management changes contact provider office for questions/concerns as evidenced notation of same in electronic health record through collaboration with PharmD and provider.    Interventions: 1:1 collaboration with KLavera Guise MD regarding development and update of comprehensive plan of care as evidenced by provider attestation and co-signature Inter-disciplinary care team collaboration (see longitudinal plan of care) Comprehensive medication review performed; medication list updated in electronic medical record  Depression/Anxiety (Goal: Control symptoms) -Controlled -Current treatment: Sertraline 1057mdaily -Medications previously tried/failed: amitriptyline, Cymbalta -Educated on Benefits of medication for symptom control -Reports mood is controlled, does not feel like she needs increased dose at this time -Recommended to continue current medication  Migraines (Goal: Control symptoms) -Controlled -Current treatment  Maxalt 103mDT Topiramate 89m52mice daily -Medications previously tried: none noted -Denies any recent migraines -Takes medication appropriately  -Recommended to continue current medication  Insomnia (Goal: Adequate sleep habits) -Controlled -Current treatment  Imipramine 25mg85m-Medications previously tried: Ambien -Recently started medication, tolerating it fine -Does state she feels like it has helped her sleep  -Recommended to continue current medication  Urge Incontinence (Goal: Contorl symptoms) -Not ideally controlled -Current treatment  Myrbetriq 89mg 91my -Medications previously tried: Vesicare, oxybutynin -Does not feel like medication is helping, still taking samples -Recently started on Imipramine, which should theoretically help with this as well  -Recommended to continue current medication Recommended she contact me if she has to start paying for this and copay is expensive.  GERD(Goal: Control/minimize symptoms) -Controlled -Current treatment  Pantoprazole 40mg d66m -Medications previously tried: none noted -Denies any recent acid reflux, taking medication appropriately  -Recommended to continue  current medication Counseled on watching trigger foods   Patient Goals/Self-Care Activities Patient will:  - take medications as prescribed target a minimum of 150 minutes of moderate intensity exercise weekly engage in dietary modifications by watching trigger foods for GERD.  Follow Up Plan: The care management team will reach out to the patient again over the next 120 days.

## 2021-09-17 DIAGNOSIS — G35 Multiple sclerosis: Secondary | ICD-10-CM | POA: Diagnosis not present

## 2021-09-19 ENCOUNTER — Telehealth: Payer: Self-pay

## 2021-10-03 DIAGNOSIS — R252 Cramp and spasm: Secondary | ICD-10-CM | POA: Diagnosis not present

## 2021-10-03 DIAGNOSIS — G35 Multiple sclerosis: Secondary | ICD-10-CM | POA: Diagnosis not present

## 2021-10-03 DIAGNOSIS — Z451 Encounter for adjustment and management of infusion pump: Secondary | ICD-10-CM | POA: Diagnosis not present

## 2021-10-03 DIAGNOSIS — M546 Pain in thoracic spine: Secondary | ICD-10-CM | POA: Diagnosis not present

## 2021-10-14 ENCOUNTER — Other Ambulatory Visit: Payer: Self-pay | Admitting: Internal Medicine

## 2021-10-14 DIAGNOSIS — N3946 Mixed incontinence: Secondary | ICD-10-CM

## 2021-10-14 DIAGNOSIS — G43009 Migraine without aura, not intractable, without status migrainosus: Secondary | ICD-10-CM

## 2021-10-14 DIAGNOSIS — Z0001 Encounter for general adult medical examination with abnormal findings: Secondary | ICD-10-CM

## 2021-10-14 DIAGNOSIS — R300 Dysuria: Secondary | ICD-10-CM

## 2021-10-14 DIAGNOSIS — Z1231 Encounter for screening mammogram for malignant neoplasm of breast: Secondary | ICD-10-CM

## 2021-10-14 DIAGNOSIS — I10 Essential (primary) hypertension: Secondary | ICD-10-CM

## 2021-10-14 DIAGNOSIS — F331 Major depressive disorder, recurrent, moderate: Secondary | ICD-10-CM

## 2021-10-14 DIAGNOSIS — E782 Mixed hyperlipidemia: Secondary | ICD-10-CM

## 2021-10-14 DIAGNOSIS — G35 Multiple sclerosis: Secondary | ICD-10-CM

## 2021-10-14 DIAGNOSIS — R5383 Other fatigue: Secondary | ICD-10-CM

## 2021-10-15 DIAGNOSIS — G35 Multiple sclerosis: Secondary | ICD-10-CM | POA: Diagnosis not present

## 2021-11-12 ENCOUNTER — Encounter: Payer: Self-pay | Admitting: Neurology

## 2021-11-12 ENCOUNTER — Other Ambulatory Visit: Payer: Self-pay | Admitting: Neurology

## 2021-11-12 ENCOUNTER — Telehealth: Payer: Self-pay | Admitting: *Deleted

## 2021-11-12 ENCOUNTER — Ambulatory Visit: Payer: Medicare Other | Admitting: Neurology

## 2021-11-12 VITALS — BP 132/82 | HR 66 | Ht 65.0 in | Wt 200.0 lb

## 2021-11-12 DIAGNOSIS — Z79899 Other long term (current) drug therapy: Secondary | ICD-10-CM | POA: Diagnosis not present

## 2021-11-12 DIAGNOSIS — R29898 Other symptoms and signs involving the musculoskeletal system: Secondary | ICD-10-CM

## 2021-11-12 DIAGNOSIS — G4700 Insomnia, unspecified: Secondary | ICD-10-CM | POA: Diagnosis not present

## 2021-11-12 DIAGNOSIS — R262 Difficulty in walking, not elsewhere classified: Secondary | ICD-10-CM | POA: Diagnosis not present

## 2021-11-12 DIAGNOSIS — G43009 Migraine without aura, not intractable, without status migrainosus: Secondary | ICD-10-CM

## 2021-11-12 DIAGNOSIS — R208 Other disturbances of skin sensation: Secondary | ICD-10-CM | POA: Diagnosis not present

## 2021-11-12 DIAGNOSIS — G35 Multiple sclerosis: Secondary | ICD-10-CM | POA: Diagnosis not present

## 2021-11-12 DIAGNOSIS — R5383 Other fatigue: Secondary | ICD-10-CM | POA: Diagnosis not present

## 2021-11-12 MED ORDER — DESMOPRESSIN ACETATE 0.1 MG PO TABS: 0.1000 mg | ORAL_TABLET | Freq: Every day | ORAL | 11 refills | Status: DC

## 2021-11-12 MED ORDER — LAMOTRIGINE 25 MG PO TABS: ORAL_TABLET | ORAL | 5 refills | Status: DC

## 2021-11-12 NOTE — Telephone Encounter (Signed)
Faxed referral to [facility] for pt to establish w/ them for baclofen pump refill. Fax: [phone]. Received fax confirmation.

## 2021-11-12 NOTE — Progress Notes (Signed)
[facility]  PATIENT: Rose Clements DOB: 03-06-1961  REFERRING DOCTOR OR PCP:  Beverely Risen, MD SOURCE: Patient, notes from primary care and neurology, MRI and laboratory reports, MRI images personally reviewed.  _________________________________   HISTORICAL  CHIEF COMPLAINT:  Chief Complaint  Patient presents with   Follow-up    Rm 2, alone. Here for 6 month MS f/u, on Tysabri. Last infusion date: 10/15/2021  Next infusion date: 11/13/2021. Pt continues to have numbness in feet's. Worsening on in wrist, has harder time holding things. Throbbing pn in hands. Imipramine has not helped as much. Baclofen pump was filled on Oct 28. Takes oral baclofen PRN.    Rose Clements is a 60 y.o. woman with relapsing multiple sclerosis.    Update 11/12/2021: She is on Tysabri.    JCV Ab was negative (0.18 05/28/2021).   We will check again today.    Her next infusion is 11/18/2021.   She has tolerated Tysabri very well.  She has not had any exacerbations but has had some slow progression of some of her symptoms.  She feels gait is stable and she uses a walker for longer distance and walls/furniture or cane within her house or for short distance.  .     She has had a couple falls but no injuries.    Her right leg will give out more.  She also has edema, left greater than right.  She has some right hand weakness and reduced coordination as well.     She has dysesthesias in legs, left > right associated with bilateral foot numbness,   Years ago she was on gabapentin but gained weight so she stopped (was on 900 mg/day)  She has seen urology and has been started on Myrbetriq for urinary urgency.    Because she was a kidney donor, she has only 1 kidney.  However, creatinine and BUN have remained in the normal levels over the last few years.  Despite Myrbetriq and imipramine, she continues to have nocturia that is bothersome.  She has a baclofen pump x 3 years last filled 10/03/21.   [facility] is  managing it. (through pain management clinic) but considering changing to [place].     Dose is low (104 mcg/day at 4.3 / hr).     She was switched to higher concentration so only needs a refill twice a year now.   She still takes rare 10 mg baclofen pills as needed when she notes more tightness in her legs.    She has fatigue.  She tried Modafinil and felt it dried her out and did not help much.   She sleeps poorly at night.      She gets headaches some nights.   She takes topiramate but is not sure it hels her headache much.  She has a headache about 1/week.   There are migrainous features with nausea, photophobia/phonopobia.   She takes Tylenol when one occurs which usually helps.   She has never tried a triptan though we prescribed Maxalt.    She had an U/S thyroid and has two colloid cysts.    She reports labs are stable   She has fatigue and slepeiness.     Modafinil help some.    She does not sleep well.   She snores but does not have OSA.    She reports PSG with Dr. Freda Munro in [place] showed narcolepsy.  She does not described cataplexy symptoms     She has vivid  dreams and occasional hypnagogic hallucinations and sleep paralysis.    MS HIstory:  She  was diagnosed with relapsing remitting MS in 2011 after she presented with stiffness in her left arm and a clumsy gait with some falls.   In retrospect some of the leg symptoms started a little earlier.    She had MRI's and LP with lesions and CSF findings consistent with MS.    She was placed on Avonex.   She did ok initially but then had an exacerbation with more issues with her legs.   She was started on Tysabri about 2016 after these issues.   She has done well with Tysabri and has not had any exacerbations.   Initially, she was diagnosed and treated in [place].  She moved to [facility]) in 2016 and has seen Dr. Ronalee Red at [facility] since that time.  She transferred care to [place] mid 2021.    IMAGING: MRI of the cervical spine  03/07/2018 and 05/15/2018 and 01/19/2020 all show a couple T2 hyperintense lesions within the spinal cord.  One to the left adjacent to C2-C3 and 1 to the right adjacent to C4.    Also has right thyroid cyst.   MRI of the brain 05/15/2018 and 03/07/2018 and 2/212/2021 show multiple T2/flair hyperintense foci.  Somewhat nonspecific but a few are periventricular and juxtacortical.  There is no change between the 3 studies.     MRI of the thoracic spine 05/16/2015 did not show any definite lesions.   Hemangiomas within the T2 and T6 vertebral body.     REVIEW OF SYSTEMS: Constitutional: No fevers, chills, sweats, or change in appetite Eyes: No visual changes, double vision, eye pain Ear, nose and throat: No hearing loss, ear pain, nasal congestion, sore throat Cardiovascular: No chest pain, palpitations Respiratory:  No shortness of breath at rest or with exertion.   No wheezes GastrointestinaI: No nausea, vomiting, diarrhea, abdominal pain, fecal incontinence Genitourinary:  No dysuria, urinary retention or frequency.  No nocturia. Musculoskeletal:  No neck pain, back pain Integumentary: No rash, pruritus, skin lesions Neurological: as above Psychiatric: No depression at this time.  No anxiety Endocrine: No palpitations, diaphoresis, change in appetite, change in weigh or increased thirst Hematologic/Lymphatic:  No anemia, purpura, petechiae. Allergic/Immunologic: No itchy/runny eyes, nasal congestion, recent allergic reactions, rashes  ALLERGIES: Allergies  Allergen Reactions   Lisinopril Hives    HOME MEDICATIONS:  Current Outpatient Medications:    acetaminophen (TYLENOL) 500 MG tablet, Take 500 mg by mouth every 6 (six) hours as needed (for pain.)., Disp: , Rfl:    baclofen (LIORESAL) 10 MG tablet, Take 10 mg by mouth See admin instructions. Take 1 tablet (10 mg) by mouth scheduled in the morning & evening, may take an additional dose at bedtime if needed for spasms., Disp: , Rfl:     cetirizine (ZYRTEC) 10 MG tablet, Take 10 mg by mouth daily., Disp: , Rfl:    Cholecalciferol (VITAMIN D3) 1000 UNITS CAPS, Take 1,000 Units by mouth in the morning and at bedtime. , Disp: , Rfl:    desmopressin (DDAVP) 0.1 MG tablet, Take 1 tablet (0.1 mg total) by mouth daily., Disp: 30 tablet, Rfl: 11   fluticasone (FLONASE) 50 MCG/ACT nasal spray, SPRAY 2 SPRAYS INTO EACH NOSTRIL EVERY DAY, Disp: 48 mL, Rfl: 2   imipramine (TOFRANIL) 25 MG tablet, Take 1 tablet (25 mg total) by mouth at bedtime., Disp: 30 tablet, Rfl: 3   lamoTRIgine (LAMICTAL) 25 MG tablet, Two  pills po bid, Disp: 120 tablet, Rfl: 5   natalizumab (TYSABRI) 300 MG/15ML injection, Inject 300 mg into the vein every 30 (thirty) days. , Disp: , Rfl:    pantoprazole (PROTONIX) 40 MG tablet, TAKE 1 TABLET BY MOUTH EVERY DAY, Disp: 90 tablet, Rfl: 1   Polyethylene Glycol POWD, Take 17 g by mouth daily. (Patient taking differently: Take 17 g by mouth daily as needed (constipation.).), Disp: 527 g, Rfl: 12   rizatriptan (MAXALT-MLT) 10 MG disintegrating tablet, Take 1 tablet (10 mg total) by mouth as needed for migraine. May repeat in 2 hours if needed, Disp: 9 tablet, Rfl: 11   sertraline (ZOLOFT) 100 MG tablet, TAKE 1 TABLET BY MOUTH DAILY, Disp: 30 tablet, Rfl: 0   topiramate (TOPAMAX) 50 MG tablet, Take 1 tablet (50 mg total) by mouth 2 (two) times daily., Disp: 180 tablet, Rfl: 3  PAST MEDICAL HISTORY: Past Medical History:  Diagnosis Date   Constipation    Cystitis bacillary, chronic    Depression    Dysphagia    Dx by Beverely Risen on 10/15/14   Eosinophilic esophagitis 11/19/2014   GE junction benign stricture s/p balloon dilation, retained food contents. ?gastroparesis versus secondary to EE.   Esophageal dysphagia    GERD (gastroesophageal reflux disease)    Heartburn    Hypertension    Migraine headache    "couple times each month"   Multilevel degenerative disc disease    Multiple sclerosis (HCC) 07/16/2015    Port-A-Cath in place    right side   Presence of intrathecal baclofen pump    right abdomen   Vertigo    Rare - none in last 6 months    PAST SURGICAL HISTORY: Past Surgical History:  Procedure Laterality Date   ABDOMINAL HYSTERECTOMY  2003   baclofen pump insertion Right 01/04/2018   CARPAL TUNNEL RELEASE Right 1997   CESAREAN SECTION  1989   COLONOSCOPY WITH PROPOFOL N/A 10/28/2020   Procedure: COLONOSCOPY WITH PROPOFOL;  Surgeon: [name], MD;  [facility];  Service: Endoscopy;  Laterality: N/A;  PRIORITY 4 port a cath   CYSTECTOMY  2002   From chest   ESOPHAGOGASTRODUODENOSCOPY (EGD) WITH PROPOFOL N/A 05/19/2016   Procedure: ESOPHAGOGASTRODUODENOSCOPY (EGD) WITH PROPOFOL;  Surgeon: [name], MD;  Location: [facility];  Service: Endoscopy;  Laterality: N/A;   IR IMAGING GUIDED PORT INSERTION  06/24/2020   KIDNEY DONATION Left 2002   OVARIAN CYST REMOVAL Left 2002   Ovarian mass removal   TONSILLECTOMY  1973   UPPER GASTROINTESTINAL ENDOSCOPY  11/19/2014   Benign appearing esophageal stricture-Dilated.    FAMILY HISTORY: Family History  Problem Relation Age of Onset   Diabetes Mother    Hypertension Mother    Heart disease Mother    Pulmonary embolism Mother    Diabetes Father    Diabetes Sister    Hypertension Sister    Heart disease Sister    Bipolar disorder Sister    Narcolepsy Brother    Heart attack Maternal Grandmother    Pancreatic cancer Maternal Grandfather    Breast cancer Maternal Aunt     SOCIAL HISTORY:  Social History   Socioeconomic History   Marital status: Married    Spouse name: [name]   Number of children: 1   Years of education: Not on file   Highest education level: Not on file  Occupational History   Occupation: Disability  Tobacco Use   Smoking status: Never   Smokeless tobacco: Never  Vaping Use   Vaping Use: Never used  Substance and Sexual Activity   Alcohol use: No    Alcohol/week: 0.0  standard drinks    Comment: rare wine on Holidays   Drug use: No   Sexual activity: Not Currently  Other Topics Concern   Not on file  Social History Narrative   Lives with husband, Ceasia Elwell   Right handed    Caffeine use: tea sometimes (mostly herbal)   Social Determinants of Health   Financial Resource Strain: Low Risk    Difficulty of Paying Living Expenses: Not very hard  Food Insecurity: Not on file  Transportation Needs: Not on file  Physical Activity: Not on file  Stress: Not on file  Social Connections: Not on file  Intimate Partner Violence: Not on file     PHYSICAL EXAM  Vitals:   11/12/21 0840  BP: 132/82  Pulse: 66  Weight: 200 lb (90.7 kg)  Height:  (1.651 m)    Body mass index is 33.28 kg/m.   General: The patient is well-developed and well-nourished and in no acute distress  Skin: Extremities are without rash or edema.   Neurologic Exam  Mental status: The patient is alert and oriented x 3 at the time of the examination. The patient has apparent normal recent and remote memory, with an apparently normal attention span and concentration ability.   Speech is normal.  Cranial nerves: Extraocular movements are full.  Facial strength was normal.  No obvious hearing deficits are noted.  Motor:  Muscle bulk is normal.   She has increased muscle tone in legs.  Muscle tone is increased, left greater than right leg.  SReduce drapid  altering movements in the left arm and strength was 4/5 in the triceps and 5/5 elsewhere.  Strength was 4 to 4+/5 in the right leg (worse)and 4/5 in the left leg.  Sensory: Sensory testing is intact to pinprick, soft touch and vibration sensation in all 4 extremities.  Coordination: Cerebellar testing reveals good finger-nose-finger and slightly reduced heel-to-shin bilaterally.  Gait and station: Station is normal.   Her gait is spastic and wide.  She is unable to tandem walk.  Romberg is negative.  Reflexes: Deep  tendon reflexes are increased in the legs, left greater than right and there are crossed abductor reflexes.  Slight asymmetry in the arms.     DIAGNOSTIC DATA (LABS, IMAGING, TESTING) - I reviewed patient records, labs, notes, testing and imaging myself where available.  Lab Results  Component Value Date   WBC 6.8 05/28/2021   HGB 11.1 05/28/2021   HCT 34.0 05/28/2021   MCV 91 05/28/2021   PLT 307 05/28/2021      Component Value Date/Time   NA 143 02/07/2020 1050   K 4.6 02/07/2020 1050   CL 107 (H) 02/07/2020 1050   CO2 20 02/07/2020 1050   GLUCOSE 87 02/07/2020 1050   GLUCOSE 101 (H) 02/15/2019 1500   BUN 9 02/07/2020 1050   CREATININE 0.96 02/07/2020 1050   CALCIUM 9.4 02/07/2020 1050   PROT 6.8 02/07/2020 1050   ALBUMIN 4.4 02/07/2020 1050   AST 14 02/07/2020 1050   ALT 5 02/07/2020 1050   ALKPHOS 88 02/07/2020 1050   BILITOT 0.5 02/07/2020 1050   GFRNONAA 66 02/07/2020 1050   GFRAA 76 02/07/2020 1050   Lab Results  Component Value Date   CHOL 222 (H) 02/07/2020   HDL 66 02/07/2020   LDLCALC 144 (H) 02/07/2020   TRIG  71 02/07/2020   No results found for: HGBA1C No results found for: VITAMINB12 Lab Results  Component Value Date   TSH 1.350 02/07/2020       ASSESSMENT AND PLAN  Multiple sclerosis (HCC) - Plan: Comprehensive metabolic panel, CBC with Differential/Platelet, Stratify JCV Antibody Test ([facility])  High risk medication use - Plan: Comprehensive metabolic panel, CBC with Differential/Platelet, Stratify JCV Antibody Test ([facility])  Migraine without aura and without status migrainosus, not intractable  Insomnia, unspecified type  Other fatigue  Difficulty in walking  Weakness of both lower extremities  Dysesthesia  1.   Continue Tysabri infusions.  We will check the JCV antibody and CBC with differential today.  She now has a central line.   2.    Continue topiramate for migraine and maxalt prn.   If it is not tolerated or does not help,  try Nurtec or Ubrelvy if needed. 3.   Stay active and exercise as tolerated. 4.    Continue Myrbetriq and follow-up with urology.  I will add low-dose desmopressin at night to try to help the nocturia.  Because she has only 1 kidney, we will keep the dose low. 5.   She has a baclofen pump filled at [facility].  The infusion rate is fairly low at 104 mcg/day.  She has not had any difficulty with the pump.  She asked if it would be possible to transfer her pump refills to [place].  We will see if she qualifies for home pump refills. 6.   Return for follow-up in 6 months  or call sooner if new or worsening neurologic symptoms.  45-minute office visit with the majority of the time spent face-to-face for history and physical, discussion/counseling and decision-making.  Additional time with record review and documentation.    Luisana Lutzke A. Epimenio Foot, MD, [facility] 11/12/2021, 9:44 AM Certified in Neurology, Clinical Neurophysiology, Sleep Medicine and Neuroimaging  [facility] [address] [place], [place] [address] [phone]

## 2021-11-12 NOTE — Telephone Encounter (Signed)
Placed JCV lab in [facility] lock box for routine lab pick up. Results pending. 

## 2021-11-12 NOTE — Patient Instructions (Signed)
The pharmacy has the prescription of lamotrigine 25 mg. Please take it as follows: For 5 days take 1 pill once a day. The next 5 days, take 1 pill twice a day. The next 5 days,  take 1 pill in the morning and 2 at bedtime or evening. Then 2 pills twice a day.   If you do get a significant rash stop the medicine immediately and let us know.

## 2021-11-13 ENCOUNTER — Ambulatory Visit: Payer: Self-pay | Admitting: Student-PharmD

## 2021-11-13 DIAGNOSIS — F331 Major depressive disorder, recurrent, moderate: Secondary | ICD-10-CM

## 2021-11-13 DIAGNOSIS — K2100 Gastro-esophageal reflux disease with esophagitis, without bleeding: Secondary | ICD-10-CM

## 2021-11-13 NOTE — Progress Notes (Signed)
General Review Call  Rose Clements, Rose Clements [id number] 60 years, Female  DOB: [birth year]  M: [phone]   General Review Completed by Hulen Luster on 11/12/2021  Chart Review What recent interventions/DTPs have been made by any provider to improve the patient's conditions in the last 3 months?:  11/12/21 Neurology Sater, Pearletha Furl, MD. For multiple sclerosis. STARTED Desmopressin Acetate 0.1 mg daily and Lamotrigine 25 mg 2 pills PO BID. STOPPED Mirabegron. COMPELETED Augmentin.  10/03/21 Neurosurgery Parente, Netty Starring, PA. For follow-up/refills. No medication changes.  Any recent hospitalizations or ED visits since last visit with CPP?: No  Adherence Review Adherence rates for STAR metric medications: N/A. Adherence rates for medications indicated for disease state being reviewed: N/A. Does the patient have >5 day gap between last estimated fill dates for any of the above medications?: No  Disease State Questions Able to connect with the Patient?: Yes Did patient have any problems with their health recently?: No Did patient have any problems with their pharmacy?: No Does patient have any issues or side effects with their medications?: No Additional  information to pass to Patient's CPP?: No Anything we can do to help take better care of Patient?: No   Pharmacist Review  Adherence gaps identified?: No Drug Therapy Problems identified?: No Assessment: Controlled  8 minutes spent in review, coordination, and documentation.  Reviewed by: Monika Salk, PharmD Clinical Pharmacist [phone]

## 2021-11-14 LAB — COMPREHENSIVE METABOLIC PANEL
ALT: 9 IU/L (ref 0–32)
AST: 16 IU/L (ref 0–40)
Albumin/Globulin Ratio: 2.2 (ref 1.2–2.2)
Albumin: 4.6 g/dL (ref 3.8–4.9)
Alkaline Phosphatase: 98 IU/L (ref 44–121)
BUN/Creatinine Ratio: 12 (ref 9–23)
BUN: 9 mg/dL (ref 6–24)
Bilirubin Total: 0.4 mg/dL (ref 0.0–1.2)
CO2: 22 mmol/L (ref 20–29)
Calcium: 9.5 mg/dL (ref 8.7–10.2)
Chloride: 106 mmol/L (ref 96–106)
Creatinine, Ser: 0.73 mg/dL (ref 0.57–1.00)
Globulin, Total: 2.1 g/dL (ref 1.5–4.5)
Glucose: 90 mg/dL (ref 70–99)
Potassium: 4.6 mmol/L (ref 3.5–5.2)
Sodium: 144 mmol/L (ref 134–144)
Total Protein: 6.7 g/dL (ref 6.0–8.5)
eGFR: 95 mL/min/{1.73_m2} (ref >59)

## 2021-11-14 LAB — CBC WITH DIFFERENTIAL/PLATELET
Basophils Absolute: 0.1 10*3/uL (ref 0.0–0.2)
Basos: 1 %
EOS (ABSOLUTE): 0.2 10*3/uL (ref 0.0–0.4)
Eos: 2 %
Hematocrit: 38.6 % (ref 34.0–46.6)
Hemoglobin: 12.6 g/dL (ref 11.1–15.9)
Immature Grans (Abs): 0 10*3/uL (ref 0.0–0.1)
Immature Granulocytes: 1 %
Lymphocytes Absolute: 3.6 10*3/uL — ABNORMAL HIGH (ref 0.7–3.1)
Lymphs: 57 %
MCH: 29.8 pg (ref 26.6–33.0)
MCHC: 32.6 g/dL (ref 31.5–35.7)
MCV: 91 fL (ref 79–97)
Monocytes Absolute: 0.4 10*3/uL (ref 0.1–0.9)
Monocytes: 6 %
Neutrophils Absolute: 2 10*3/uL (ref 1.4–7.0)
Neutrophils: 33 %
Platelets: 285 10*3/uL (ref 150–450)
RBC: 4.23 x10E6/uL (ref 3.77–5.28)
RDW: 12.6 % (ref 11.7–15.4)
WBC: 6.3 10*3/uL (ref 3.4–10.8)

## 2021-11-19 ENCOUNTER — Telehealth (INDEPENDENT_AMBULATORY_CARE_PROVIDER_SITE_OTHER): Payer: Medicare Other | Admitting: Nurse Practitioner

## 2021-11-19 ENCOUNTER — Encounter: Payer: Self-pay | Admitting: Nurse Practitioner

## 2021-11-19 VITALS — BP 127/85 | Temp 98.7°F | Ht 63.0 in | Wt 197.0 lb

## 2021-11-19 DIAGNOSIS — U071 COVID-19: Secondary | ICD-10-CM | POA: Diagnosis not present

## 2021-11-19 DIAGNOSIS — R051 Acute cough: Secondary | ICD-10-CM | POA: Diagnosis not present

## 2021-11-19 DIAGNOSIS — J069 Acute upper respiratory infection, unspecified: Secondary | ICD-10-CM | POA: Diagnosis not present

## 2021-11-19 DIAGNOSIS — F331 Major depressive disorder, recurrent, moderate: Secondary | ICD-10-CM

## 2021-11-19 MED ORDER — NIRMATRELVIR/RITONAVIR (PAXLOVID)TABLET: 3.0000 | ORAL_TABLET | Freq: Two times a day (BID) | ORAL | 0 refills | Status: AC

## 2021-11-19 MED ORDER — SERTRALINE HCL 100 MG PO TABS: 100.0000 mg | ORAL_TABLET | Freq: Every day | ORAL | 2 refills | Status: DC

## 2021-11-19 MED ORDER — BENZONATATE 100 MG PO CAPS: 100.0000 mg | ORAL_CAPSULE | Freq: Two times a day (BID) | ORAL | 0 refills | Status: DC | PRN

## 2021-11-19 NOTE — Telephone Encounter (Signed)
Rose Clements from [facility]   [phone] xt [phone] has called to inform pt is now active and now on service for pump management at home

## 2021-11-19 NOTE — Telephone Encounter (Signed)
FYI

## 2021-11-19 NOTE — Progress Notes (Signed)
[facility] [address] [place], [place] [address]  Internal MEDICINE  Telephone Visit  Patient Name: Rose Clements  [id number]  [id number]  Date of Service: 11/19/2021  I connected with the patient at 3:20 PM by telephone and verified the patients identity using two identifiers.   I discussed the limitations, risks, security and privacy concerns of performing an evaluation and management service by telephone and the availability of in person appointments. I also discussed with the patient that there may be a patient responsible charge related to the service.  The patient expressed understanding and agrees to proceed.    Chief Complaint  Patient presents with   Telephone Assessment    [id number]   Telephone Screen    Covid test positive    Sinusitis    Start last Friday    Headache    HPI Rose Clements presents for a telehealth virtual visit for covid positive upper respiratory infection. She tested positive for covid today. She initially started feeling bad on Friday with a headache. Over the weekend, she developed a cough, sinus pain/pressure, fatigue, body aches and a low grade fever. She initially thought it was just another sinus infection like the one she was treated for in June earlier this year.    Current Medication: Outpatient Encounter Medications as of 11/19/2021  Medication Sig Note   benzonatate (TESSALON) 100 MG capsule Take 1 capsule (100 mg total) by mouth 2 (two) times daily as needed for cough.    nirmatrelvir/ritonavir EUA (PAXLOVID) 20 x 150 MG & 10 x 100MG  TABS Take 3 tablets by mouth 2 (two) times daily for 5 days.    acetaminophen (TYLENOL) 500 MG tablet Take 500 mg by mouth every 6 (six) hours as needed (for pain.).    baclofen (LIORESAL) 10 MG tablet Take 10 mg by mouth See admin instructions. Take 1 tablet (10 mg) by mouth scheduled in the morning & evening, may take an additional dose at bedtime if needed for spasms.    cetirizine (ZYRTEC) 10 MG  tablet Take 10 mg by mouth daily.    Cholecalciferol (VITAMIN D3) 1000 UNITS CAPS Take 1,000 Units by mouth in the morning and at bedtime.     desmopressin (DDAVP) 0.1 MG tablet Take 1 tablet (0.1 mg total) by mouth daily.    fluticasone (FLONASE) 50 MCG/ACT nasal spray SPRAY 2 SPRAYS INTO EACH NOSTRIL EVERY DAY    imipramine (TOFRANIL) 25 MG tablet Take 1 tablet (25 mg total) by mouth at bedtime.    lamoTRIgine (LAMICTAL) 25 MG tablet Two pills po bid    natalizumab (TYSABRI) 300 MG/15ML injection Inject 300 mg into the vein every 30 (thirty) days.  11/18/2020: JCV ab drawn on 11/11/20 negative, index: 0.15    pantoprazole (PROTONIX) 40 MG tablet TAKE 1 TABLET BY MOUTH EVERY DAY    Polyethylene Glycol POWD Take 17 g by mouth daily. (Patient taking differently: Take 17 g by mouth daily as needed (constipation.).)    rizatriptan (MAXALT-MLT) 10 MG disintegrating tablet Take 1 tablet (10 mg total) by mouth as needed for migraine. May repeat in 2 hours if needed    sertraline (ZOLOFT) 100 MG tablet Take 1 tablet (100 mg total) by mouth daily.    topiramate (TOPAMAX) 50 MG tablet Take 1 tablet (50 mg total) by mouth 2 (two) times daily.    [DISCONTINUED] sertraline (ZOLOFT) 100 MG tablet TAKE 1 TABLET BY MOUTH DAILY    No facility-administered encounter medications on file as of 11/19/2021.  Surgical History: Past Surgical History:  Procedure Laterality Date   ABDOMINAL HYSTERECTOMY  2003   baclofen pump insertion Right 01/04/2018   CARPAL TUNNEL RELEASE Right 1997   CESAREAN SECTION  1989   COLONOSCOPY WITH PROPOFOL N/A 10/28/2020   Procedure: COLONOSCOPY WITH PROPOFOL;  Surgeon: Lucilla Lame, MD;  Location: [facility];  Service: Endoscopy;  Laterality: N/A;  PRIORITY 4 port a cath   CYSTECTOMY  2002   From chest   ESOPHAGOGASTRODUODENOSCOPY (EGD) WITH PROPOFOL N/A 05/19/2016   Procedure: ESOPHAGOGASTRODUODENOSCOPY (EGD) WITH PROPOFOL;  Surgeon: Lucilla Lame, MD;  Location: [facility]  ENDOSCOPY;  Service: Endoscopy;  Laterality: N/A;   IR IMAGING GUIDED PORT INSERTION  06/24/2020   KIDNEY DONATION Left 2002   OVARIAN CYST REMOVAL Left 2002   Ovarian mass removal   TONSILLECTOMY  1973   UPPER GASTROINTESTINAL ENDOSCOPY  11/19/2014   Benign appearing esophageal stricture-Dilated.    Medical History: Past Medical History:  Diagnosis Date   Constipation    Cystitis bacillary, chronic    Depression    Dysphagia    Dx by Clayborn Bigness on [id number]   Eosinophilic esophagitis [id number]   GE junction benign stricture s/p balloon dilation, retained food contents. ?gastroparesis versus secondary to EE.   Esophageal dysphagia    GERD (gastroesophageal reflux disease)    Heartburn    Hypertension    Migraine headache    "couple times each month"   Multilevel degenerative disc disease    Multiple sclerosis ([place]) 07/16/2015   Port-A-Cath in place    right side   Presence of intrathecal baclofen pump    right abdomen   Vertigo    Rare - none in last 6 months    Family History: Family History  Problem Relation Age of Onset   Diabetes Mother    Hypertension Mother    Heart disease Mother    Pulmonary embolism Mother    Diabetes Father    Diabetes Sister    Hypertension Sister    Heart disease Sister    Bipolar disorder Sister    Narcolepsy Brother    Heart attack Maternal Grandmother    Pancreatic cancer Maternal Grandfather    Breast cancer Maternal Aunt     Social History   Socioeconomic History   Marital status: Married    Spouse name: [name]   Number of children: 1   Years of education: Not on file   Highest education level: Not on file  Occupational History   Occupation: Disability  Tobacco Use   Smoking status: Never   Smokeless tobacco: Never  Vaping Use   Vaping Use: Never used  Substance and Sexual Activity   Alcohol use: No    Alcohol/week: 0.0 standard drinks    Comment: rare wine on Holidays   Drug use: No   Sexual activity: Not  Currently  Other Topics Concern   Not on file  Social History Narrative   Lives with husband, Shanele Belongia   Right handed    Caffeine use: tea sometimes (mostly herbal)   Social Determinants of Health   Financial Resource Strain: Low Risk    Difficulty of Paying Living Expenses: Not very hard  Food Insecurity: Not on file  Transportation Needs: Not on file  Physical Activity: Not on file  Stress: Not on file  Social Connections: Not on file  Intimate Partner Violence: Not on file      Review of Systems  Constitutional:  Positive for fatigue and fever.  HENT:  Positive for congestion, postnasal drip, rhinorrhea, sinus pressure, sinus pain and sore throat.   Respiratory:  Positive for cough. Negative for chest tightness, shortness of breath and wheezing.   Cardiovascular:  Negative for chest pain and palpitations.  Gastrointestinal:  Negative for abdominal pain, constipation, diarrhea, nausea and vomiting.  Musculoskeletal:  Positive for myalgias.  Neurological:  Positive for headaches. Negative for dizziness.   Vital Signs: BP 127/85    Temp 98.7 F (37.1 C)    Ht 5\' 3"  (1.6 m)    Wt 197 lb (89.4 kg)    BMI 34.90 kg/m    Observation/Objective: She is alert and oriented, and engages in conversation appropriately. She does not appear to be in any acute distress over video call.     Assessment/Plan: 1. Upper respiratory tract infection due to COVID-19 virus Paxlovid prescribed for treatment for covid infection - nirmatrelvir/ritonavir EUA (PAXLOVID) 20 x 150 MG & 10 x 100MG  TABS; Take 3 tablets by mouth 2 (two) times daily for 5 days.  Dispense: 30 tablet; Refill: 0  2. Acute cough Medication prescribed for symptomatic relief.  - benzonatate (TESSALON) 100 MG capsule; Take 1 capsule (100 mg total) by mouth 2 (two) times daily as needed for cough.  Dispense: 30 capsule; Refill: 0  3. Major depressive disorder, recurrent episode, moderate ([place]) Refills sent to pharmacy  as requested.  - sertraline (ZOLOFT) 100 MG tablet; Take 1 tablet (100 mg total) by mouth daily.  Dispense: 30 tablet; Refill: 2   General Counseling: Rose Clements verbalizes understanding of the findings of today's phone visit and agrees with plan of treatment. I have discussed any further diagnostic evaluation that may be needed or ordered today. We also reviewed her medications today. she has been encouraged to call the office with any questions or concerns that should arise related to todays visit.  Return if symptoms worsen or fail to improve.   No orders of the defined types were placed in this encounter.   Meds ordered this encounter  Medications   nirmatrelvir/ritonavir EUA (PAXLOVID) 20 x 150 MG & 10 x 100MG  TABS    Sig: Take 3 tablets by mouth 2 (two) times daily for 5 days.    Dispense:  30 tablet    Refill:  0   benzonatate (TESSALON) 100 MG capsule    Sig: Take 1 capsule (100 mg total) by mouth 2 (two) times daily as needed for cough.    Dispense:  30 capsule    Refill:  0   sertraline (ZOLOFT) 100 MG tablet    Sig: Take 1 tablet (100 mg total) by mouth daily.    Dispense:  30 tablet    Refill:  2    Pt needs appt for further refills     Time spent:10 Minutes Time spent with patient included reviewing progress notes, labs, imaging studies, and discussing plan for follow up.  [place] Controlled Substance Database was reviewed by me for overdose risk score (ORS) if appropriate.  This patient was seen by Jonetta Osgood, FNP-C in collaboration with Dr. Clayborn Bigness as a part of collaborative care agreement.  Deon Duer R. Valetta Fuller, MSN, FNP-C Internal medicine

## 2021-11-20 ENCOUNTER — Telehealth: Payer: Medicare Other | Admitting: Nurse Practitioner

## 2021-11-20 NOTE — Telephone Encounter (Signed)
Received fax from [facility] that JCV ab drawn on 11/12/21 negative, index: 0.14

## 2021-11-27 DIAGNOSIS — G35 Multiple sclerosis: Secondary | ICD-10-CM | POA: Diagnosis not present

## 2021-12-03 DIAGNOSIS — G35 Multiple sclerosis: Secondary | ICD-10-CM | POA: Diagnosis not present

## 2021-12-03 DIAGNOSIS — M6249 Contracture of muscle, multiple sites: Secondary | ICD-10-CM | POA: Diagnosis not present

## 2021-12-05 DIAGNOSIS — K2100 Gastro-esophageal reflux disease with esophagitis, without bleeding: Secondary | ICD-10-CM

## 2021-12-05 DIAGNOSIS — I10 Essential (primary) hypertension: Secondary | ICD-10-CM

## 2022-01-01 DIAGNOSIS — G35 Multiple sclerosis: Secondary | ICD-10-CM | POA: Diagnosis not present

## 2022-01-07 ENCOUNTER — Ambulatory Visit: Payer: Medicare Other | Admitting: Student-PharmD

## 2022-01-07 ENCOUNTER — Other Ambulatory Visit: Payer: Self-pay | Admitting: Internal Medicine

## 2022-01-07 ENCOUNTER — Other Ambulatory Visit: Payer: Self-pay

## 2022-01-07 DIAGNOSIS — F331 Major depressive disorder, recurrent, moderate: Secondary | ICD-10-CM

## 2022-01-07 DIAGNOSIS — K2100 Gastro-esophageal reflux disease with esophagitis, without bleeding: Secondary | ICD-10-CM

## 2022-01-07 DIAGNOSIS — K219 Gastro-esophageal reflux disease without esophagitis: Secondary | ICD-10-CM

## 2022-01-07 NOTE — Progress Notes (Signed)
Follow Up Pharmacist Visit   Rose Clements, Rose Clements [id number] 60 years, Female  DOB: 04/10/1961  M: [phone]  Clinical Summary Patient Risk: Low Next CCM Follow Up: 07/08/22 Next AWV: pt will call office to schedule Next PCP Visit: pt will call office to schedule Summary for PCP:  -Patient was recently started on desmopressin and lamotrigine by neurologist. Plans on starting the medications today. -Patient needs refill of pantoprazole, will be calling pharmacy to send a request over. -Pt reports well controlled mood and will notify PCP if anxiety/depression worsens -Reminded pt to contact office to setup an appt with PCP  Patient Chart Prep  Completed by Hulen Luster on 01/07/2022  Chronic Conditions Patient's Chronic Conditions: Hypertension (HTN), Gastroesophageal Reflux Disease (GERD), Insomnia, Anxiety, Constipation, Depression  Doctor and Hospital Visits Were there PCP Visits since last visit with the Pharmacist?: Yes Visit #1: 11/19/21 Video Visit. Sallyanne Kuster, NP. For Upper respiratory tract infection due to COVID-19 virus. STARTED Benxonatate 100 mg 2 times daily PRN and Paxlovid 20 x 150 MG & 10 X 100 mg 3 tablets 2 times daily. Were there Specialist Visits since last visit with the Pharmacist?: No Was there a Hospital Visit in last 30 days?: No Were there other Hospital Visits since last visit with the Pharmacist?: No  Medication Information Have there been any medication changes from PCP or Specialist since last visit with the Pharmacist?: No Are there any Medication adherence gaps (beyond 5 days past due)?: No Medication adherence rates for the STAR rating drugs: None. List Patient's current Care Gaps: No current Care Gaps identified  Pre-Call Questions  Completed by Hulen Luster on 01/07/2022  Are you able to connect with Patient: Yes Confirmed appointment date/time with patient/caregiver?: Yes Date/time of the appointment: 01/07/22 at 12: 00 PM  Visit  type: Phone Patient/Caregiver instructed to bring medications to appointment: Yes What, if any, problems do you have getting your medications from the pharmacy?: None What is your top health concern to discuss at your upcoming visit?: Patient stated none. Patient stated she does need a refill for pantoprazole 40 mg. Have you seen any other providers since your last visit?: No  Disease Assessments  Subjective Information Current BP: 127/85 Current HR: 66 taken on: 11/12/2021 Weight: 197 BMI: 33.28 Last GFR: 95 taken on: 11/12/2021 Why did the patient present?: CCM F/U Visit Factors that may affect medication adherence?: Pill burden Any additional demeanor/mood notes?: Patient is pleasant to speak with. States she was just started on Lamotrigine and Desmopressin by her neurologist.  Depression Assess this condition today?: Yes Completing the PHQ-9 Questionnaire today?: No In your opinion, how do you feel your depression symptoms have been controlled over the past 3 months?: Stable / stayed the same Assessment:: Controlled Drug: Sertraline  1 tablet daily Assessment: Appropriate, Effective, Safe, Accessible Additional Info: Patient states she feels her mood has been good lately. Stated she sees a therapist twice a month and that does help. Patient was recently followed by two men and that gave her some anxiety; Informed patient to let us know if anxiety/mood worsens. Plan to: Continue current medication therapy HC Follow up: 3 months Pharmacist Follow up: 6 months  GERD Assessment Assess this condition today?: Yes How frequently do you have symptoms of GERD or reflux?: occasionally When does patient experience reflux symptoms?: After meals Does patient experience difficulty or pain with swallowing?: No What OTC medications have you tried and what was the response?: None Is the patient a smoker or exposed to  second-hand smoke?: No What potential triggering foods have you  identified and tried to limit intake in your diet?: spicy foods Have these changes been successful in reducing reflux symptoms?: Yes We discussed: Identifying and avoiding / limiting trigger foods Assessment:: Controlled Drug: Pantoprazole 40mg  1 tablet daily Assessment: Appropriate, Effective, Safe, Accessible Additional Info: Patient stated she needs a refill of her pantoprazole soon. Pt will have pharmacy send a request over. Plan to: Continue current medication therapy. HC Follow up: 3 months Pharmacist Follow up: 6 months  Exercise, Diet and Non-Drug Coordination Needs Additional exercise counseling points. We discussed: targeting at least 151 minutes per week of moderate-intensity aerobic exercise. Additional diet counseling points. We discussed: aiming to consume at least 8 cups of water day Discussed Non-Drug Care Coordination Needs: Yes Does Patient have Medication financial barriers?: No  Accountable Health Communities Health-Related Social Needs Screening Tool -  SDOH  What is your living situation today? (ref #1): I have a steady place to live Think about the place you live. Do you have problems with any of the following? (ref #2): None of the above Within the past 12 months, you worried that your food would run out before you got money to buy more (ref #3): Never true Within the past 12 months, the food you bought just didn't last and you didn't have money to get more (ref #4): Never true In the past 12 months, has lack of reliable transportation kept you from medical appointments, meetings, work or from getting things needed for daily living? (ref #5): No In the past 12 months, has the electric, gas, oil, or water company threatened to shut off services in your home? (ref #6): No How often does anyone, including family and friends, physically hurt you? (ref #7): Never (1) How often does anyone, including family and friends, insult or talk down to you? (ref #8): Never (1) How  often does anyone, including friends and family, threaten you with harm? (ref #9): Never (1) How often does anyone, including family and friends, scream or curse at you? (ref #10): Never (1)  Managing Depression, Adult Depression is a mental health condition that affects your thoughts, feelings, and actions. Being diagnosed with depression can bring you relief if you did not know why you have felt or behaved a certain way. It could also leave you feeling overwhelmed with uncertainty about your future. Preparing yourself to manage your symptoms can help you feel more positive about your future. How to manage lifestyle changes Managing stress Stress is your body's reaction to life changes and events, both good and bad. Stress can add to your feelings of depression. Learning to manage your stress can help lessen your feelings of depression. Try some of the following approaches to reducing your stress (stress reduction techniques): Listen to music that you enjoy and that inspires you. Try using a meditation app or take a meditation class. Develop a practice that helps you connect with your spiritual self. Walk in nature, pray, or go to a place of worship. Do some deep breathing. To do this, inhale slowly through your nose. Pause at the top of your inhale for a few seconds and then exhale slowly, letting your muscles relax. Practice yoga to help relax and work your muscles. Choose a stress reduction technique that suits your lifestyle and personality. These techniques take time and practice to develop. Set aside 5-15 minutes a day to do them. Therapists can offer training in these techniques. Other things you can do  to manage stress include: Keeping a stress diary. Knowing your limits and saying no when you think something is too much. Paying attention to how you react to certain situations. You may not be able to control everything, but you can change your reaction. Adding humor to your life by  watching funny films or TV shows. Making time for activities that you enjoy and that relax you.   Medicines Medicines, such as antidepressants, are often a part of treatment for depression. Talk with your pharmacist or health care provider about all the medicines, supplements, and herbal products that you take, their possible side effects, and what medicines and other products are safe to take together. Make sure to report any side effects you may have to your health care provider. Relationships Your health care provider may suggest family therapy, couples therapy, or individual therapy as part of your treatment. How to recognize changes Everyone responds differently to treatment for depression. As you recover from depression, you may start to: Have more interest in doing activities. Feel less hopeless. Have more energy. Overeat less often, or have a better appetite. Have better mental focus. It is important to recognize if your depression is not getting better or is getting worse. The symptoms you had in the beginning may return, such as: Tiredness (fatigue) or low energy. Eating too much or too little. Sleeping too much or too little. Feeling restless, agitated, or hopeless. Trouble focusing or making decisions. Unexplained physical complaints. Feeling irritable, angry, or aggressive. If you or your family members notice these symptoms coming back, let your health care provider know right away. Follow these instructions at home: Activity Try to get some form of exercise each day, such as walking, biking, swimming, or lifting weights. Practice stress reduction techniques. Engage your mind by taking a class or doing some volunteer work. Lifestyle Get the right amount and quality of sleep. Cut down on using caffeine, tobacco, alcohol, and other potentially harmful substances. Eat a healthy diet that includes plenty of vegetables, fruits, whole grains, low-fat dairy products, and lean  protein. Do not eat a lot of foods that are high in solid fats, added sugars, or salt (sodium). General instructions Take over-the-counter and prescription medicines only as told by your health care provider. Keep all follow-up visits as told by your health care provider. This is important. Where to find support Talking to others Friends and family members can be sources of support and guidance. Talk to trusted friends or family members about your condition. Explain your symptoms to them, and let them know that you are working with a health care provider to treat your depression. Tell friends and family members how they also can be helpful. Finances Find appropriate mental health providers that fit with your financial situation. Talk with your health care provider about options to get reduced prices on your medicines. Where to find more information You can find support in your area from: Anxiety and Depression Association of America (ADAA): www.adaa.org Mental Health America: www.mentalhealthamerica.net [employer] on Mental Illness: www.nami.org Contact a health care provider if: You stop taking your antidepressant medicines, and you have any of these symptoms: Nausea. Headache. Light-headedness. Chills and body aches. Not being able to sleep (insomnia). You or your friends and family think your depression is getting worse. Get help right away if: You have thoughts of hurting yourself or others. If you ever feel like you may hurt yourself or others, or have thoughts about taking your own life, get help right  away. Go to your nearest emergency department or: Call your local emergency services (911 in the [place] Call a suicide crisis helpline, such as the National Suicide Prevention Lifeline at 7572758509 or 988 in the [place] This is open 24 hours a day in the [place] Text the Crisis Text Line at 920-172-7700 (in the [place] Summary If you are diagnosed with depression, preparing yourself  to manage your symptoms is a good way to feel positive about your future. Work with your health care provider on a management plan that includes stress reduction techniques, medicines (if applicable), therapy, and healthy lifestyle habits. Keep talking with your health care provider about how your treatment is working. If you have thoughts about taking your own life, call a suicide crisis helpline or text a crisis text line. This information is not intended to replace advice given to you by your health care provider. Make sure you discuss any questions you have with your health care provider.     Monika Salk Clinical Pharmacist [phone]

## 2022-01-17 ENCOUNTER — Other Ambulatory Visit: Payer: Self-pay | Admitting: Neurology

## 2022-01-19 ENCOUNTER — Encounter: Payer: Self-pay | Admitting: Neurology

## 2022-01-29 DIAGNOSIS — G35 Multiple sclerosis: Secondary | ICD-10-CM | POA: Diagnosis not present

## 2022-02-03 ENCOUNTER — Telehealth: Payer: Self-pay | Admitting: Neurology

## 2022-02-03 DIAGNOSIS — K2100 Gastro-esophageal reflux disease with esophagitis, without bleeding: Secondary | ICD-10-CM | POA: Diagnosis not present

## 2022-02-03 DIAGNOSIS — I10 Essential (primary) hypertension: Secondary | ICD-10-CM | POA: Diagnosis not present

## 2022-02-03 NOTE — Telephone Encounter (Signed)
Pt requesting a letter to be excused from jury duty due to neurological condition.

## 2022-02-03 NOTE — Telephone Encounter (Signed)
Dr. Sater- are you ok with providing this? 

## 2022-02-04 ENCOUNTER — Encounter: Payer: Self-pay | Admitting: Neurology

## 2022-02-04 NOTE — Telephone Encounter (Signed)
LVM letting pt know letter ready for pick up. Made her aware we close today at 5pm, open 8-5pm tomorrow and closed Friday.  ?

## 2022-02-05 ENCOUNTER — Encounter: Payer: Self-pay | Admitting: Neurology

## 2022-02-09 ENCOUNTER — Other Ambulatory Visit: Payer: Self-pay | Admitting: Internal Medicine

## 2022-02-09 DIAGNOSIS — Z1231 Encounter for screening mammogram for malignant neoplasm of breast: Secondary | ICD-10-CM

## 2022-02-12 ENCOUNTER — Other Ambulatory Visit: Payer: Self-pay

## 2022-02-12 ENCOUNTER — Encounter: Payer: Self-pay | Admitting: Neurology

## 2022-02-12 ENCOUNTER — Ambulatory Visit: Payer: Medicare Other | Admitting: Neurology

## 2022-02-12 VITALS — BP 141/80 | HR 80 | Ht 63.0 in | Wt 199.0 lb

## 2022-02-12 DIAGNOSIS — Z978 Presence of other specified devices: Secondary | ICD-10-CM

## 2022-02-12 DIAGNOSIS — R262 Difficulty in walking, not elsewhere classified: Secondary | ICD-10-CM | POA: Diagnosis not present

## 2022-02-12 DIAGNOSIS — G43009 Migraine without aura, not intractable, without status migrainosus: Secondary | ICD-10-CM

## 2022-02-12 DIAGNOSIS — Z79899 Other long term (current) drug therapy: Secondary | ICD-10-CM

## 2022-02-12 DIAGNOSIS — N3941 Urge incontinence: Secondary | ICD-10-CM | POA: Diagnosis not present

## 2022-02-12 DIAGNOSIS — R208 Other disturbances of skin sensation: Secondary | ICD-10-CM

## 2022-02-12 DIAGNOSIS — R5383 Other fatigue: Secondary | ICD-10-CM | POA: Diagnosis not present

## 2022-02-12 DIAGNOSIS — G35 Multiple sclerosis: Secondary | ICD-10-CM | POA: Diagnosis not present

## 2022-02-12 MED ORDER — RIZATRIPTAN BENZOATE 10 MG PO TBDP: 10.0000 mg | ORAL_TABLET | ORAL | 11 refills | Status: DC | PRN

## 2022-02-12 MED ORDER — MODAFINIL 200 MG PO TABS: 200.0000 mg | ORAL_TABLET | Freq: Every day | ORAL | 5 refills | Status: DC

## 2022-02-12 NOTE — Progress Notes (Signed)
[facility]  PATIENT: Rose Clements DOB: 1961/04/20  REFERRING DOCTOR OR PCP:  Beverely Risen, MD SOURCE: Patient, notes from primary care and neurology, MRI and laboratory reports, MRI images personally reviewed.  _________________________________   HISTORICAL  CHIEF COMPLAINT:  Chief Complaint  Patient presents with   Follow-up    Rm 1, alone. Pt here to f/u for increase falls. Has fallen 4x last few weeks. Ambulates with a rollator walker. On of which resulted in a head injury, causing a small bump that has resolved. Weakness in R wrist. Waking up dizzy, feels like room is upside down.    Rose Clements is a 61 y.o. woman with relapsing multiple sclerosis.    Update 08/27/2019: She is on Tysabri.    JCV Ab was negative (0.14 11/12/2021).   She has tolerated Tysabri very well.  She has not had any exacerbations.  Unfortunately, she had some slow progression of some of her symptoms.  She has had 4 falls in last 2 months.   This is a combination of weakness and balance.   She notes right > left leg weakness and stiffness.   It gives out at times.   She uses a walker mostly, sometimes a cane.  She is able to walk more rapidly with a walker.  She has a right greater than left foot drop in the right foot seems to trip on the floor at times.  She broke her left ankle in a fracture in 2015 (needed pins).  She also has edema, left greater than right.  She has some tingling in legs bilaterally.    She has vertigo.      She has some right hand weakness and reduced coordination as well.  It is sometimes numb or dysesthetic.   Years ago she was on gabapentin but gained weight so she stopped (was on 900 mg/day).   She is tolerating lamotrigine 50 mg po bid ad that has helped  Myrbetriq was too expensive so I added DDAVP for her nocturia    Imipramine had not helped the bladder,  Because she was a kidney donor, she has only 1 kidney.  However, creatinine and BUN have remained in the  normal levels over the last few years.    She has a baclofen pump  since 2019.   Dose is low (104 mcg/day at 4.3 / hr).     She was switched to higher concentration so only needs a refill twice a year now.   She still takes rare 10 mg baclofen pills as needed when she notes more tightness in her legs.    She has fatigue.  She tried Modafinil and felt it dried her out and did not help much.   She sleeps poorly at night.      Migraines are better with topiramate and imipramine.  She gets headaches some nights.    She has a headache about 1/week.   There are migrainous features with nausea, photophobia/phonopobia.   She takes Tylenol when one occurs which usually helps.   She has never tried a triptan though we prescribed Maxalt.    She had an U/S thyroid and has two colloid cysts.    She reports labs are stable   She has fatigue and sleeiness.     Modafinil helped some but she stopped    She does not sleep well.   She snores but does not have OSA.    She reports PSG with Dr. Freda Munro in   showed narcolepsy.  She does not described cataplexy symptoms     She has vivid dreams and occasional hypnagogic hallucinations and sleep paralysis.    MS HIstory:  She  was diagnosed with relapsing remitting MS in 2011 after she presented with stiffness in her left arm and a clumsy gait with some falls.   In retrospect some of the leg symptoms started a little earlier.    She had MRI's and LP with lesions and CSF findings consistent with MS.    She was placed on Avonex.   She did ok initially but then had an exacerbation with more issues with her legs.   She was started on Tysabri about 2016 after these issues.   She has done well with Tysabri and has not had any exacerbations.   Initially, she was diagnosed and treated in [place].  She moved to [facility] in 2016 and has seen Dr. Ronalee Red at [facility] since that time.  She transferred care to [place] mid 2021.    IMAGING: MRI of the cervical  spine 03/07/2018 and 05/15/2018 and 01/19/2020 all show a couple T2 hyperintense lesions within the spinal cord.  One to the left adjacent to C2-C3 and 1 to the right adjacent to C4.    Also has right thyroid cyst.   MRI of the brain 05/15/2018 and 03/07/2018 and 2/212/2021 show multiple T2/flair hyperintense foci.  Somewhat nonspecific but a few are periventricular and juxtacortical.  There is no change between the 3 studies.     MRI of the thoracic spine 05/16/2015 did not show any definite lesions.   Hemangiomas within the T2 and T6 vertebral body.    REVIEW OF SYSTEMS: Constitutional: No fevers, chills, sweats, or change in appetite Eyes: No visual changes, double vision, eye pain Ear, nose and throat: No hearing loss, ear pain, nasal congestion, sore throat Cardiovascular: No chest pain, palpitations Respiratory:  No shortness of breath at rest or with exertion.   No wheezes GastrointestinaI: No nausea, vomiting, diarrhea, abdominal pain, fecal incontinence Genitourinary:  No dysuria, urinary retention or frequency.  No nocturia. Musculoskeletal:  No neck pain, back pain Integumentary: No rash, pruritus, skin lesions Neurological: as above Psychiatric: No depression at this time.  No anxiety Endocrine: No palpitations, diaphoresis, change in appetite, change in weigh or increased thirst Hematologic/Lymphatic:  No anemia, purpura, petechiae. Allergic/Immunologic: No itchy/runny eyes, nasal congestion, recent allergic reactions, rashes  ALLERGIES: Allergies  Allergen Reactions   Lisinopril Hives    HOME MEDICATIONS:  Current Outpatient Medications:    acetaminophen (TYLENOL) 500 MG tablet, Take 500 mg by mouth every 6 (six) hours as needed (for pain.)., Disp: , Rfl:    baclofen (LIORESAL) 10 MG tablet, Take 10 mg by mouth See admin instructions. Take 1 tablet (10 mg) by mouth scheduled in the morning & evening, may take an additional dose at bedtime if needed for spasms., Disp: , Rfl:     benzonatate (TESSALON) 100 MG capsule, Take 1 capsule (100 mg total) by mouth 2 (two) times daily as needed for cough., Disp: 30 capsule, Rfl: 0   cetirizine (ZYRTEC) 10 MG tablet, Take 10 mg by mouth daily., Disp: , Rfl:    Cholecalciferol (VITAMIN D3) 1000 UNITS CAPS, Take 1,000 Units by mouth in the morning and at bedtime. , Disp: , Rfl:    desmopressin (DDAVP) 0.1 MG tablet, Take 1 tablet (0.1 mg total) by mouth daily., Disp: 30 tablet, Rfl: 11   fluticasone (FLONASE) 50 MCG/ACT nasal  spray, SPRAY 2 SPRAYS INTO EACH NOSTRIL EVERY DAY, Disp: 48 mL, Rfl: 2   imipramine (TOFRANIL) 25 MG tablet, Take 1 tablet (25 mg total) by mouth at bedtime., Disp: 30 tablet, Rfl: 3   lamoTRIgine (LAMICTAL) 25 MG tablet, Two pills po bid, Disp: 120 tablet, Rfl: 5   modafinil (PROVIGIL) 200 MG tablet, Take 1 tablet (200 mg total) by mouth daily., Disp: 30 tablet, Rfl: 5   natalizumab (TYSABRI) 300 MG/15ML injection, Inject 300 mg into the vein every 30 (thirty) days. , Disp: , Rfl:    pantoprazole (PROTONIX) 40 MG tablet, TAKE 1 TABLET BY MOUTH EVERY DAY, Disp: 90 tablet, Rfl: 1   Polyethylene Glycol POWD, Take 17 g by mouth daily. (Patient taking differently: Take 17 g by mouth daily as needed (constipation.).), Disp: 527 g, Rfl: 12   sertraline (ZOLOFT) 100 MG tablet, Take 1 tablet (100 mg total) by mouth daily., Disp: 30 tablet, Rfl: 2   topiramate (TOPAMAX) 50 MG tablet, TAKE 1 TABLET(50 MG) BY MOUTH TWICE DAILY, Disp: 180 tablet, Rfl: 1   rizatriptan (MAXALT-MLT) 10 MG disintegrating tablet, Take 1 tablet (10 mg total) by mouth as needed for migraine. May repeat in 2 hours if needed, Disp: 9 tablet, Rfl: 11  PAST MEDICAL HISTORY: Past Medical History:  Diagnosis Date   Constipation    Cystitis bacillary, chronic    Depression    Dysphagia    Dx by Beverely Risen on 10/15/14   Eosinophilic esophagitis 11/19/2014   GE junction benign stricture s/p balloon dilation, retained food contents. ?gastroparesis versus  secondary to EE.   Esophageal dysphagia    GERD (gastroesophageal reflux disease)    Heartburn    Hypertension    Migraine headache    "couple times each month"   Multilevel degenerative disc disease    Multiple sclerosis (HCC) 07/16/2015   Port-A-Cath in place    right side   Presence of intrathecal baclofen pump    right abdomen   Vertigo    Rare - none in last 6 months    PAST SURGICAL HISTORY: Past Surgical History:  Procedure Laterality Date   ABDOMINAL HYSTERECTOMY  2003   baclofen pump insertion Right 01/04/2018   CARPAL TUNNEL RELEASE Right 1997   CESAREAN SECTION  1989   COLONOSCOPY WITH PROPOFOL N/A 10/28/2020   Procedure: COLONOSCOPY WITH PROPOFOL;  Surgeon: [name], MD;  Location: [facility];  Service: Endoscopy;  Laterality: N/A;  PRIORITY 4 port a cath   CYSTECTOMY  2002   From chest   ESOPHAGOGASTRODUODENOSCOPY (EGD) WITH PROPOFOL N/A 05/19/2016   Procedure: ESOPHAGOGASTRODUODENOSCOPY (EGD) WITH PROPOFOL;  Surgeon: [name], MD;  Location: [facility];  Service: Endoscopy;  Laterality: N/A;   IR IMAGING GUIDED PORT INSERTION  06/24/2020   KIDNEY DONATION Left 2002   OVARIAN CYST REMOVAL Left 2002   Ovarian mass removal   TONSILLECTOMY  1973   UPPER GASTROINTESTINAL ENDOSCOPY  11/19/2014   Benign appearing esophageal stricture-Dilated.    FAMILY HISTORY: Family History  Problem Relation Age of Onset   Diabetes Mother    Hypertension Mother    Heart disease Mother    Pulmonary embolism Mother    Diabetes Father    Diabetes Sister    Hypertension Sister    Heart disease Sister    Bipolar disorder Sister    Narcolepsy Brother    Heart attack Maternal Grandmother    Pancreatic cancer Maternal Grandfather    Breast cancer Maternal Aunt  SOCIAL HISTORY:  Social History   Socioeconomic History   Marital status: Married    Spouse name: [name]   Number of children: 1   Years of education: Not on file   Highest education level:  Not on file  Occupational History   Occupation: Disability  Tobacco Use   Smoking status: Never   Smokeless tobacco: Never  Vaping Use   Vaping Use: Never used  Substance and Sexual Activity   Alcohol use: No    Alcohol/week: 0.0 standard drinks    Comment: rare wine on Holidays   Drug use: No   Sexual activity: Not Currently  Other Topics Concern   Not on file  Social History Narrative   Lives with husband, Cheron Coryell   Right handed    Caffeine use: tea sometimes (mostly herbal)   Social Determinants of Health   Financial Resource Strain: Low Risk    Difficulty of Paying Living Expenses: Not very hard  Food Insecurity: Not on file  Transportation Needs: Not on file  Physical Activity: Not on file  Stress: Not on file  Social Connections: Not on file  Intimate Partner Violence: Not on file     PHYSICAL EXAM  Vitals:   02/12/22 1326  BP: (!) 141/80  Pulse: 80  Weight: 199 lb (90.3 kg)  Height: 5\' 3"  (1.6 m)    Body mass index is 35.25 kg/m.   General: The patient is well-developed and well-nourished and in no acute distress  Skin: Extremities are without rash or edema.   Neurologic Exam  Mental status: The patient is alert and oriented x 3 at the time of the examination. The patient has apparent normal recent and remote memory, with an apparently normal attention span and concentration ability.   Speech is normal.  Cranial nerves: Extraocular movements are full.  Facial strength was normal.  No obvious hearing deficits are noted.  Motor:  Muscle bulk is normal.   She has increased muscle tone in legs.  Muscle tone is increased, left greater than right leg.  She has reduced rapid alternating movements in the left arm and strength was 4/5 in the triceps and 5/5 elsewhere.  Strength was 4 /5 in both legs  Sensory: Sensory testing is intact to pinprick, soft touch and vibration sensation in all 4 extremities.  Coordination: Cerebellar testing reveals good  finger-nose-finger and slightly reduced heel-to-shin bilaterally.  Gait and station: Station is normal.   She is able to walk without support but the gait is spastic, wide with a very reduced stride.  Her stride is quite a bit better with the walker.  She is unable to tandem walk.  Romberg is negative.  Reflexes: Deep tendon reflexes are increased in the legs, left greater than right and there are crossed abductor reflexes.  Slight asymmetry in the arms.     DIAGNOSTIC DATA (LABS, IMAGING, TESTING) - I reviewed patient records, labs, notes, testing and imaging myself where available.  Lab Results  Component Value Date   WBC 6.3 11/12/2021   HGB 12.6 11/12/2021   HCT 38.6 11/12/2021   MCV 91 11/12/2021   PLT 285 11/12/2021      Component Value Date/Time   NA 144 11/12/2021 0000   K 4.6 11/12/2021 0000   CL 106 11/12/2021 0000   CO2 22 11/12/2021 0000   GLUCOSE 90 11/12/2021 0000   GLUCOSE 101 (H) 02/15/2019 1500   BUN 9 11/12/2021 0000   CREATININE 0.73 11/12/2021 0000   CALCIUM  9.5 11/12/2021 0000   PROT 6.7 11/12/2021 0000   ALBUMIN 4.6 11/12/2021 0000   AST 16 11/12/2021 0000   ALT 9 11/12/2021 0000   ALKPHOS 98 11/12/2021 0000   BILITOT 0.4 11/12/2021 0000   GFRNONAA 66 02/07/2020 1050   GFRAA 76 02/07/2020 1050   Lab Results  Component Value Date   CHOL 222 (H) 02/07/2020   HDL 66 02/07/2020   LDLCALC 144 (H) 02/07/2020   TRIG 71 02/07/2020   No results found for: HGBA1C No results found for: VITAMINB12 Lab Results  Component Value Date   TSH 1.350 02/07/2020       ASSESSMENT AND PLAN  Multiple sclerosis (HCC)  High risk medication use  Difficulty in walking  Urge incontinence  Presence of intrathecal pump  Other fatigue  Dysesthesia  1.   Continue Tysabri infusions.   JCV antibody was negative 3 months ago.  She now has a central line.  Will get a few months Tysabri in [place] when she vists daughter for an extended stay.    2.    Continue  topiramate and imipramine for migraine and maxalt prn.   If it is not tolerated or does not help, try Nurtec or Ubrelvy if needed. 3.   Stay active and exercise as tolerated. 4.    Myrbetriq was too expensive. Continue low-dose desmopressin at night to try to help the nocturia.  Because she has only 1 kidney, we will keep the dose low. 5.   She has a baclofen pump. --- only 104 mcg/day, we discussed mild increase due to spasicity but because of weakness we can't increase much.   [facility] is now doing.     6.   Return for follow-up in 6 months  or call sooner if new or worsening neurologic symptoms.  40-minute office visit with the majority of the time spent face-to-face for history and physical, discussion/counseling and decision-making.  Additional time with record review and documentation.    Kamira Mellette A. Epimenio Foot, MD, [facility] 02/12/2022, 6:12 PM Certified in Neurology, Clinical Neurophysiology, Sleep Medicine and Neuroimaging  [facility] [address], [address] [place], [place] [address] [phone]

## 2022-02-18 ENCOUNTER — Telehealth: Payer: Self-pay

## 2022-02-18 NOTE — Telephone Encounter (Signed)
Placed letter in medical records to be mailed.  ?

## 2022-02-24 ENCOUNTER — Other Ambulatory Visit: Payer: Self-pay | Admitting: Internal Medicine

## 2022-02-24 DIAGNOSIS — F331 Major depressive disorder, recurrent, moderate: Secondary | ICD-10-CM

## 2022-02-25 ENCOUNTER — Ambulatory Visit: Payer: Medicare Other | Admitting: Nurse Practitioner

## 2022-03-02 DIAGNOSIS — G35 Multiple sclerosis: Secondary | ICD-10-CM | POA: Diagnosis not present

## 2022-03-16 ENCOUNTER — Ambulatory Visit: Payer: Self-pay | Admitting: Urology

## 2022-03-16 ENCOUNTER — Ambulatory Visit
Admission: RE | Admit: 2022-03-16 | Discharge: 2022-03-16 | Disposition: A | Discharge status: Home / Self Care | Payer: Medicare Other | Source: Ambulatory Visit | Attending: Internal Medicine | Admitting: Internal Medicine

## 2022-03-16 DIAGNOSIS — Z1231 Encounter for screening mammogram for malignant neoplasm of breast: Secondary | ICD-10-CM | POA: Insufficient documentation

## 2022-03-19 ENCOUNTER — Telehealth: Payer: Self-pay | Admitting: *Deleted

## 2022-03-19 NOTE — Telephone Encounter (Signed)
Faxed order to [facility] for them to increase pt baclofen pump from 159mcg/day to 181mcg/day per Dr. Epimenio Foot. Fax: [phone]. Received fax confirmation. ?

## 2022-03-25 DIAGNOSIS — M6249 Contracture of muscle, multiple sites: Secondary | ICD-10-CM | POA: Diagnosis not present

## 2022-03-25 DIAGNOSIS — G35 Multiple sclerosis: Secondary | ICD-10-CM | POA: Diagnosis not present

## 2022-03-26 ENCOUNTER — Ambulatory Visit (INDEPENDENT_AMBULATORY_CARE_PROVIDER_SITE_OTHER): Payer: Medicare Other | Admitting: Nurse Practitioner

## 2022-03-26 ENCOUNTER — Encounter: Payer: Self-pay | Admitting: Nurse Practitioner

## 2022-03-26 VITALS — BP 110/70 | HR 66 | Temp 98.6°F | Resp 16 | Ht 63.0 in | Wt 203.4 lb

## 2022-03-26 DIAGNOSIS — E559 Vitamin D deficiency, unspecified: Secondary | ICD-10-CM

## 2022-03-26 DIAGNOSIS — R5383 Other fatigue: Secondary | ICD-10-CM | POA: Diagnosis not present

## 2022-03-26 DIAGNOSIS — E042 Nontoxic multinodular goiter: Secondary | ICD-10-CM | POA: Diagnosis not present

## 2022-03-26 DIAGNOSIS — E538 Deficiency of other specified B group vitamins: Secondary | ICD-10-CM

## 2022-03-26 DIAGNOSIS — F331 Major depressive disorder, recurrent, moderate: Secondary | ICD-10-CM

## 2022-03-26 DIAGNOSIS — G35 Multiple sclerosis: Secondary | ICD-10-CM | POA: Diagnosis not present

## 2022-03-26 DIAGNOSIS — Z0001 Encounter for general adult medical examination with abnormal findings: Secondary | ICD-10-CM

## 2022-03-26 DIAGNOSIS — R300 Dysuria: Secondary | ICD-10-CM

## 2022-03-26 DIAGNOSIS — G43009 Migraine without aura, not intractable, without status migrainosus: Secondary | ICD-10-CM

## 2022-03-26 DIAGNOSIS — E782 Mixed hyperlipidemia: Secondary | ICD-10-CM

## 2022-03-26 DIAGNOSIS — K2100 Gastro-esophageal reflux disease with esophagitis, without bleeding: Secondary | ICD-10-CM

## 2022-03-26 MED ORDER — ALPRAZOLAM 0.25 MG PO TABS: 0.2500 mg | ORAL_TABLET | Freq: Two times a day (BID) | ORAL | 1 refills | Status: DC | PRN

## 2022-03-26 MED ORDER — SERTRALINE HCL 100 MG PO TABS: 150.0000 mg | ORAL_TABLET | Freq: Every day | ORAL | 3 refills | Status: DC

## 2022-03-26 NOTE — Progress Notes (Signed)
[facility] [place], [place] [address]  Internal MEDICINE  Office Visit Note  Patient Name: Rose Clements  [id number]  [id number]  Date of Service: 03/26/2022  Chief Complaint  Patient presents with   Medicare Wellness   Depression   Gastroesophageal Reflux   Hypertension   Headache    Has discussed with neurologist     HPI Rose Clements presents for an annual well visit and physical exam.  She is a well-appearing 61 year old female with hypertension, GERD, migraines, thyroid nodules, multiple sclerosis, depression and anxiety.  She had a routine screening mammogram earlier this month and the result was BI-RADS Category 1 negative.  She has a history of multiple thyroid nodules and had a thyroid ultrasound done in 2021 and is interested in having a repeat thyroid ultrasound to evaluate for any changes. She is due for routine labs.  Her blood pressure and other vital signs are within normal limits. She reports that she has been feeling more depressed lately, is staying in bed more, and some days she does not get up or interact with anyone, she is also lacking interest in things she normally likes to do such as going to church and arrival study.  She also reports that she is not sleeping well.     Current Medication: Outpatient Encounter Medications as of 03/26/2022  Medication Sig Note   acetaminophen (TYLENOL) 500 MG tablet Take 500 mg by mouth every 6 (six) hours as needed (for pain.).    ALPRAZolam (XANAX) 0.25 MG tablet Take 1 tablet (0.25 mg total) by mouth 2 (two) times daily as needed (panic attacks).    baclofen (LIORESAL) 10 MG tablet Take 10 mg by mouth See admin instructions. Take 1 tablet (10 mg) by mouth scheduled in the morning & evening, may take an additional dose at bedtime if needed for spasms.    cetirizine (ZYRTEC) 10 MG tablet Take 10 mg by mouth daily.    Cholecalciferol (VITAMIN D3) 1000 UNITS CAPS Take 1,000 Units by mouth in the morning and at  bedtime.     desmopressin (DDAVP) 0.1 MG tablet Take 1 tablet (0.1 mg total) by mouth daily.    fluticasone (FLONASE) 50 MCG/ACT nasal spray SPRAY 2 SPRAYS INTO EACH NOSTRIL EVERY DAY    imipramine (TOFRANIL) 25 MG tablet Take 1 tablet (25 mg total) by mouth at bedtime.    lamoTRIgine (LAMICTAL) 25 MG tablet Two pills po bid    modafinil (PROVIGIL) 200 MG tablet Take 1 tablet (200 mg total) by mouth daily.    natalizumab (TYSABRI) 300 MG/15ML injection Inject 300 mg into the vein every 30 (thirty) days.  11/18/2020: JCV ab drawn on 11/11/20 negative, index: 0.15    pantoprazole (PROTONIX) 40 MG tablet TAKE 1 TABLET BY MOUTH EVERY DAY    Polyethylene Glycol POWD Take 17 g by mouth daily. (Patient taking differently: Take 17 g by mouth daily as needed (constipation.).)    rizatriptan (MAXALT-MLT) 10 MG disintegrating tablet Take 1 tablet (10 mg total) by mouth as needed for migraine. May repeat in 2 hours if needed    topiramate (TOPAMAX) 50 MG tablet TAKE 1 TABLET(50 MG) BY MOUTH TWICE DAILY    [DISCONTINUED] benzonatate (TESSALON) 100 MG capsule Take 1 capsule (100 mg total) by mouth 2 (two) times daily as needed for cough.    [DISCONTINUED] sertraline (ZOLOFT) 100 MG tablet TAKE 1 TABLET BY MOUTH DAILY    sertraline (ZOLOFT) 100 MG tablet Take 1.5 tablets (150 mg  total) by mouth daily.    No facility-administered encounter medications on file as of 03/26/2022.    Surgical History: Past Surgical History:  Procedure Laterality Date   ABDOMINAL HYSTERECTOMY  2003   baclofen pump insertion Right 01/04/2018   CARPAL TUNNEL RELEASE Right 1997   CESAREAN SECTION  1989   COLONOSCOPY WITH PROPOFOL N/A 10/28/2020   Procedure: COLONOSCOPY WITH PROPOFOL;  Surgeon: Lucilla Lame, MD;  Location: [place];  Service: Endoscopy;  Laterality: N/A;  PRIORITY 4 port a cath   CYSTECTOMY  2002   From chest   ESOPHAGOGASTRODUODENOSCOPY (EGD) WITH PROPOFOL N/A 05/19/2016   Procedure:  ESOPHAGOGASTRODUODENOSCOPY (EGD) WITH PROPOFOL;  Surgeon: Lucilla Lame, MD;  Location: [facility];  Service: Endoscopy;  Laterality: N/A;   IR IMAGING GUIDED PORT INSERTION  06/24/2020   KIDNEY DONATION Left 2002   OVARIAN CYST REMOVAL Left 2002   Ovarian mass removal   TONSILLECTOMY  1973   UPPER GASTROINTESTINAL ENDOSCOPY  11/19/2014   Benign appearing esophageal stricture-Dilated.    Medical History: Past Medical History:  Diagnosis Date   Constipation    Cystitis bacillary, chronic    Depression    Dysphagia    Dx by Clayborn Bigness on 56/8/12   Eosinophilic esophagitis 75/17/0017   GE junction benign stricture s/p balloon dilation, retained food contents. ?gastroparesis versus secondary to EE.   Esophageal dysphagia    GERD (gastroesophageal reflux disease)    Heartburn    Hypertension    Migraine headache    "couple times each month"   Multilevel degenerative disc disease    Multiple sclerosis ([place]) 07/16/2015   Port-A-Cath in place    right side   Presence of intrathecal baclofen pump    right abdomen   Vertigo    Rare - none in last 6 months    Family History: Family History  Problem Relation Age of Onset   Diabetes Mother    Hypertension Mother    Heart disease Mother    Pulmonary embolism Mother    Diabetes Father    Diabetes Sister    Hypertension Sister    Heart disease Sister    Bipolar disorder Sister    Narcolepsy Brother    Heart attack Maternal Grandmother    Pancreatic cancer Maternal Grandfather    Breast cancer Maternal Aunt     Social History   Socioeconomic History   Marital status: Married    Spouse name: Rose Clements   Number of children: 1   Years of education: Not on file   Highest education level: Not on file  Occupational History   Occupation: Disability  Tobacco Use   Smoking status: Never   Smokeless tobacco: Never  Vaping Use   Vaping Use: Never used  Substance and Sexual Activity   Alcohol use: No    Alcohol/week: 0.0  standard drinks    Comment: rare wine on Holidays   Drug use: No   Sexual activity: Not Currently  Other Topics Concern   Not on file  Social History Narrative   Lives with husband, Rose Clements   Right handed    Caffeine use: tea sometimes (mostly herbal)   Social Determinants of Health   Financial Resource Strain: Low Risk    Difficulty of Paying Living Expenses: Not very hard  Food Insecurity: Not on file  Transportation Needs: Not on file  Physical Activity: Not on file  Stress: Not on file  Social Connections: Not on file  Intimate Partner Violence: Not on  file      Review of Systems  Constitutional:  Negative for activity change, appetite change, chills, fatigue, fever and unexpected weight change.  HENT: Negative.  Negative for congestion, ear pain, rhinorrhea, sore throat and trouble swallowing.   Eyes: Negative.   Respiratory: Negative.  Negative for cough, chest tightness, shortness of breath and wheezing.   Cardiovascular: Negative.  Negative for chest pain.  Gastrointestinal: Negative.  Negative for abdominal pain, blood in stool, constipation, diarrhea, nausea and vomiting.  Endocrine: Negative.   Genitourinary: Negative.  Negative for difficulty urinating, dysuria, frequency, hematuria and urgency.  Musculoskeletal: Negative.  Negative for arthralgias, back pain, joint swelling, myalgias and neck pain.  Skin: Negative.  Negative for rash and wound.  Allergic/Immunologic: Negative.  Negative for immunocompromised state.  Neurological:  Positive for headaches. Negative for dizziness, seizures and numbness.  Hematological: Negative.   Psychiatric/Behavioral:  Positive for behavioral problems, depression and sleep disturbance. Negative for self-injury and suicidal ideas. The patient is nervous/anxious.    Vital Signs: BP 110/70   Pulse 66   Temp 98.6 F (37 C)   Resp 16   Ht '5\' 3"'  (1.6 m)   Wt 203 lb 6.4 oz (92.3 kg)   SpO2 98%   BMI 36.03 kg/m     Physical Exam Vitals reviewed.  Constitutional:      General: She is awake. She is not in acute distress.    Appearance: Normal appearance. She is well-developed and well-groomed. She is obese. She is not ill-appearing or diaphoretic.  HENT:     Head: Normocephalic and atraumatic.     Right Ear: Tympanic membrane, ear canal and external ear normal.     Left Ear: Tympanic membrane, ear canal and external ear normal.     Nose: Nose normal. No congestion or rhinorrhea.     Mouth/Throat:     Lips: Pink.     Mouth: Mucous membranes are moist.     Pharynx: Oropharynx is clear. Uvula midline. No oropharyngeal exudate or posterior oropharyngeal erythema.  Eyes:     General: Lids are normal. Vision grossly intact. Gaze aligned appropriately. No scleral icterus.       Right eye: No discharge.        Left eye: No discharge.     Extraocular Movements: Extraocular movements intact.     Conjunctiva/sclera: Conjunctivae normal.     Pupils: Pupils are equal, round, and reactive to light.     Funduscopic exam:    Right eye: Red reflex present.        Left eye: Red reflex present. Neck:     Thyroid: No thyromegaly.     Vascular: No carotid bruit or JVD.     Trachea: No tracheal deviation.  Cardiovascular:     Rate and Rhythm: Normal rate and regular rhythm.     Pulses: Normal pulses.     Heart sounds: Normal heart sounds, S1 normal and S2 normal. No murmur heard.   No friction rub. No gallop.  Pulmonary:     Effort: Pulmonary effort is normal. No accessory muscle usage or respiratory distress.     Breath sounds: Normal breath sounds and air entry. No stridor. No wheezing or rales.  Chest:     Chest wall: No tenderness.  Abdominal:     General: Bowel sounds are normal. There is no distension.     Palpations: Abdomen is soft. There is no mass.     Tenderness: There is no abdominal tenderness. There is no  guarding or rebound.  Musculoskeletal:        General: No tenderness or deformity.  Normal range of motion.     Cervical back: Normal range of motion and neck supple.     Right lower leg: No edema.     Left lower leg: No edema.  Lymphadenopathy:     Cervical: No cervical adenopathy.  Skin:    General: Skin is warm and dry.     Capillary Refill: Capillary refill takes less than 2 seconds.     Coloration: Skin is not pale.     Findings: No erythema or rash.  Neurological:     Mental Status: She is alert and oriented to person, place, and time.     Cranial Nerves: No cranial nerve deficit.     Motor: No abnormal muscle tone.     Coordination: Coordination normal.     Deep Tendon Reflexes: Reflexes are normal and symmetric.  Psychiatric:        Mood and Affect: Mood normal.        Behavior: Behavior normal. Behavior is cooperative.        Thought Content: Thought content normal.        Judgment: Judgment normal.       Assessment/Plan: 1. Encounter for general adult medical examination with abnormal findings Age-appropriate preventive screenings and vaccinations discussed, annual physical exam completed. Routine labs for health maintenance ordered, see below. PHM updated.  - CBC with Differential/Platelet - CMP14+EGFR  2. Multiple thyroid nodules Routine labs ordered. Repeat thyroid ultrasound ordered.  - CBC with Differential/Platelet - CMP14+EGFR - TSH + free T4 - US THYROID; Future  3. Multiple sclerosis ([place]) Routine labs ordered.  - CBC with Differential/Platelet - CMP14+EGFR  4. Gastroesophageal reflux disease with esophagitis, unspecified whether hemorrhage Stable, routine labs ordered.  - CBC with Differential/Platelet - CMP14+EGFR  5. Migraine without aura and without status migrainosus, not intractable Stable, routine labs ordered.  - CBC with Differential/Platelet - CMP14+EGFR  6. Other fatigue Routine labs ordered.  - CBC with Differential/Platelet - CMP14+EGFR  7. Mixed hyperlipidemia Routine labs ordered - CBC with  Differential/Platelet - CMP14+EGFR - Lipid Profile  8. Vitamin D deficiency Routine labs ordered. - CBC with Differential/Platelet - CMP14+EGFR - Vitamin D (25 hydroxy)  9. B12 deficiency Routine labs ordered.  - CBC with Differential/Platelet - CMP14+EGFR - B12 and Folate Panel  10. Dysuria Routine urinalysis ordered.  - UA/M w/rflx Culture, Routine - Microscopic Examination  11. Major depressive disorder, recurrent episode, moderate (HCC) Continue sertraline, alprazolam refill ordered to help with increased anxiety and trouble sleeping.  - sertraline (ZOLOFT) 100 MG tablet; Take 1.5 tablets (150 mg total) by mouth daily.  Dispense: 135 tablet; Refill: 3 - ALPRAZolam (XANAX) 0.25 MG tablet; Take 1 tablet (0.25 mg total) by mouth 2 (two) times daily as needed (panic attacks).  Dispense: 30 tablet; Refill: 1      General Counseling: Shyrl verbalizes understanding of the findings of todays visit and agrees with plan of treatment. I have discussed any further diagnostic evaluation that may be needed or ordered today. We also reviewed her medications today. she has been encouraged to call the office with any questions or concerns that should arise related to todays visit.    Orders Placed This Encounter  Procedures   Microscopic Examination   US THYROID   UA/M w/rflx Culture, Routine   CBC with Differential/Platelet   CMP14+EGFR   TSH + free T4   B12 and Folate  Panel   Vitamin D (25 hydroxy)   Lipid Profile    Meds ordered this encounter  Medications   sertraline (ZOLOFT) 100 MG tablet    Sig: Take 1.5 tablets (150 mg total) by mouth daily.    Dispense:  135 tablet    Refill:  3    ZERO refills remain on this prescription. Your patient is requesting advance approval of refills for this medication to PREVENT ANY MISSED DOSES   ALPRAZolam (XANAX) 0.25 MG tablet    Sig: Take 1 tablet (0.25 mg total) by mouth 2 (two) times daily as needed (panic attacks).    Dispense:   30 tablet    Refill:  1    Return in about 4 weeks (around 04/23/2022) for F/U, U/S @ [facility] PCP.   Total time spent:30 Minutes Time spent includes review of chart, medications, test results, and follow up plan with the patient.   [place] Controlled Substance Database was reviewed by me.  This patient was seen by Jonetta Osgood, FNP-C in collaboration with Dr. Clayborn Bigness as a part of collaborative care agreement.  Brad Mcgaughy R. Valetta Fuller, MSN, FNP-C Internal medicine

## 2022-03-27 LAB — UA/M W/RFLX CULTURE, ROUTINE
Bilirubin, UA: NEGATIVE
Glucose, UA: NEGATIVE
Ketones, UA: NEGATIVE
Leukocytes,UA: NEGATIVE
Nitrite, UA: NEGATIVE
Protein,UA: NEGATIVE
RBC, UA: NEGATIVE
Specific Gravity, UA: 1.023 (ref 1.005–1.030)
Urobilinogen, Ur: 1 mg/dL (ref 0.2–1.0)
pH, UA: 8 — ABNORMAL HIGH (ref 5.0–7.5)

## 2022-03-27 LAB — MICROSCOPIC EXAMINATION
Bacteria, UA: NONE SEEN
Casts: NONE SEEN /lpf
Epithelial Cells (non renal): NONE SEEN /hpf (ref 0–10)
RBC, Urine: NONE SEEN /hpf (ref 0–2)

## 2022-03-30 ENCOUNTER — Other Ambulatory Visit: Payer: Self-pay | Admitting: *Deleted

## 2022-03-30 DIAGNOSIS — Z79899 Other long term (current) drug therapy: Secondary | ICD-10-CM

## 2022-03-30 DIAGNOSIS — G35 Multiple sclerosis: Secondary | ICD-10-CM | POA: Diagnosis not present

## 2022-03-31 DIAGNOSIS — K2100 Gastro-esophageal reflux disease with esophagitis, without bleeding: Secondary | ICD-10-CM | POA: Diagnosis not present

## 2022-03-31 DIAGNOSIS — Z0001 Encounter for general adult medical examination with abnormal findings: Secondary | ICD-10-CM | POA: Diagnosis not present

## 2022-03-31 DIAGNOSIS — R300 Dysuria: Secondary | ICD-10-CM | POA: Diagnosis not present

## 2022-03-31 DIAGNOSIS — E782 Mixed hyperlipidemia: Secondary | ICD-10-CM | POA: Diagnosis not present

## 2022-03-31 DIAGNOSIS — E559 Vitamin D deficiency, unspecified: Secondary | ICD-10-CM | POA: Diagnosis not present

## 2022-03-31 DIAGNOSIS — G35 Multiple sclerosis: Secondary | ICD-10-CM | POA: Diagnosis not present

## 2022-04-01 ENCOUNTER — Telehealth (INDEPENDENT_AMBULATORY_CARE_PROVIDER_SITE_OTHER): Payer: Medicare Other | Admitting: Nurse Practitioner

## 2022-04-01 ENCOUNTER — Encounter: Payer: Self-pay | Admitting: Nurse Practitioner

## 2022-04-01 VITALS — Temp 98.4°F | Resp 16 | Ht 63.0 in | Wt 198.0 lb

## 2022-04-01 DIAGNOSIS — G35 Multiple sclerosis: Secondary | ICD-10-CM | POA: Diagnosis not present

## 2022-04-01 DIAGNOSIS — J069 Acute upper respiratory infection, unspecified: Secondary | ICD-10-CM | POA: Diagnosis not present

## 2022-04-01 DIAGNOSIS — R051 Acute cough: Secondary | ICD-10-CM | POA: Diagnosis not present

## 2022-04-01 DIAGNOSIS — U071 COVID-19: Secondary | ICD-10-CM

## 2022-04-01 LAB — CBC WITH DIFFERENTIAL/PLATELET
Basophils Absolute: 0.1 10*3/uL (ref 0.0–0.2)
Basos: 1 %
EOS (ABSOLUTE): 0.2 10*3/uL (ref 0.0–0.4)
Eos: 3 %
Hematocrit: 37.4 % (ref 34.0–46.6)
Hemoglobin: 12.4 g/dL (ref 11.1–15.9)
Immature Grans (Abs): 0 10*3/uL (ref 0.0–0.1)
Immature Granulocytes: 0 %
Lymphocytes Absolute: 4.2 10*3/uL — ABNORMAL HIGH (ref 0.7–3.1)
Lymphs: 61 %
MCH: 30.5 pg (ref 26.6–33.0)
MCHC: 33.2 g/dL (ref 31.5–35.7)
MCV: 92 fL (ref 79–97)
Monocytes Absolute: 0.5 10*3/uL (ref 0.1–0.9)
Monocytes: 8 %
NRBC: 1 % — ABNORMAL HIGH (ref 0–0)
Neutrophils Absolute: 1.8 10*3/uL (ref 1.4–7.0)
Neutrophils: 27 %
Platelets: 285 10*3/uL (ref 150–450)
RBC: 4.07 x10E6/uL (ref 3.77–5.28)
RDW: 12.3 % (ref 11.7–15.4)
WBC: 6.8 10*3/uL (ref 3.4–10.8)

## 2022-04-01 LAB — LIPID PANEL
Chol/HDL Ratio: 3.3 ratio (ref 0.0–4.4)
Cholesterol, Total: 267 mg/dL — ABNORMAL HIGH (ref 100–199)
HDL: 80 mg/dL (ref >39)
LDL Chol Calc (NIH): 178 mg/dL — ABNORMAL HIGH (ref 0–99)
Triglycerides: 61 mg/dL (ref 0–149)
VLDL Cholesterol Cal: 9 mg/dL (ref 5–40)

## 2022-04-01 LAB — CMP14+EGFR
ALT: 8 IU/L (ref 0–32)
AST: 13 IU/L (ref 0–40)
Albumin/Globulin Ratio: 2.3 — ABNORMAL HIGH (ref 1.2–2.2)
Albumin: 4.5 g/dL (ref 3.8–4.9)
Alkaline Phosphatase: 96 IU/L (ref 44–121)
BUN/Creatinine Ratio: 11 (ref 9–23)
BUN: 10 mg/dL (ref 6–24)
Bilirubin Total: 0.2 mg/dL (ref 0.0–1.2)
CO2: 18 mmol/L — ABNORMAL LOW (ref 20–29)
Calcium: 9.5 mg/dL (ref 8.7–10.2)
Chloride: 106 mmol/L (ref 96–106)
Creatinine, Ser: 0.87 mg/dL (ref 0.57–1.00)
Globulin, Total: 2 g/dL (ref 1.5–4.5)
Glucose: 93 mg/dL (ref 70–99)
Potassium: 4.2 mmol/L (ref 3.5–5.2)
Sodium: 143 mmol/L (ref 134–144)
Total Protein: 6.5 g/dL (ref 6.0–8.5)
eGFR: 77 mL/min/{1.73_m2} (ref >59)

## 2022-04-01 LAB — B12 AND FOLATE PANEL
Folate: 10.5 ng/mL (ref >3.0)
Vitamin B-12: 179 pg/mL — ABNORMAL LOW (ref 232–1245)

## 2022-04-01 LAB — TSH+FREE T4
Free T4: 1.04 ng/dL (ref 0.82–1.77)
TSH: 2.32 u[IU]/mL (ref 0.450–4.500)

## 2022-04-01 LAB — VITAMIN D 25 HYDROXY (VIT D DEFICIENCY, FRACTURES): Vit D, 25-Hydroxy: 33.9 ng/mL (ref 30.0–100.0)

## 2022-04-01 MED ORDER — HYDROCOD POLI-CHLORPHE POLI ER 10-8 MG/5ML PO SUER: 5.0000 mL | Freq: Two times a day (BID) | ORAL | 0 refills | Status: DC | PRN

## 2022-04-01 MED ORDER — NIRMATRELVIR/RITONAVIR (PAXLOVID)TABLET: 3.0000 | ORAL_TABLET | Freq: Two times a day (BID) | ORAL | 0 refills | Status: AC

## 2022-04-01 NOTE — Progress Notes (Signed)
[facility] [address] [place], [place] [address]  Internal MEDICINE  Telephone Visit  Patient Name: Francess Mullen  [id number]  [id number]  Date of Service: 04/01/2022  I connected with the patient at 1:25 PM by telephone and verified the patients identity using two identifiers.   I discussed the limitations, risks, security and privacy concerns of performing an evaluation and management service by telephone and the availability of in person appointments. I also discussed with the patient that there may be a patient responsible charge related to the service.  The patient expressed understanding and agrees to proceed.    Chief Complaint  Patient presents with   Acute Visit    Covid pos. today   Telephone Assessment    Video    Telephone Screen    [phone]    Cough   Headache   Sinusitis    Congestion    Fever   Generalized Body Aches    HPI Olean presents for a telehealth virtual visit for symptoms of sinusitis.  Patient tested positive for COVID today.  She had a COVID infection in December last year and did take the 5 days with Paxlovid.  She reports having a cough, headache, nasal congestion, body aches, fever, chills.   Current Medication: Outpatient Encounter Medications as of 04/01/2022  Medication Sig Note   acetaminophen (TYLENOL) 500 MG tablet Take 500 mg by mouth every 6 (six) hours as needed (for pain.).    ALPRAZolam (XANAX) 0.25 MG tablet Take 1 tablet (0.25 mg total) by mouth 2 (two) times daily as needed (panic attacks).    baclofen (LIORESAL) 10 MG tablet Take 10 mg by mouth See admin instructions. Take 1 tablet (10 mg) by mouth scheduled in the morning & evening, may take an additional dose at bedtime if needed for spasms.    cetirizine (ZYRTEC) 10 MG tablet Take 10 mg by mouth daily.    chlorpheniramine-HYDROcodone (TUSSIONEX PENNKINETIC ER) 10-8 MG/5ML Take 5 mLs by mouth every 12 (twelve) hours as needed for cough.    Cholecalciferol  (VITAMIN D3) 1000 UNITS CAPS Take 1,000 Units by mouth in the morning and at bedtime.     desmopressin (DDAVP) 0.1 MG tablet Take 1 tablet (0.1 mg total) by mouth daily.    fluticasone (FLONASE) 50 MCG/ACT nasal spray SPRAY 2 SPRAYS INTO EACH NOSTRIL EVERY DAY    imipramine (TOFRANIL) 25 MG tablet Take 1 tablet (25 mg total) by mouth at bedtime.    lamoTRIgine (LAMICTAL) 25 MG tablet Two pills po bid    modafinil (PROVIGIL) 200 MG tablet Take 1 tablet (200 mg total) by mouth daily.    natalizumab (TYSABRI) 300 MG/15ML injection Inject 300 mg into the vein every 30 (thirty) days.  11/18/2020: JCV ab drawn on 11/11/20 negative, index: 0.15    [EXPIRED] nirmatrelvir/ritonavir EUA (PAXLOVID) 20 x 150 MG & 10 x  TABS Take 3 tablets by mouth 2 (two) times daily for 5 days. (Take nirmatrelvir 150 mg two tablets twice daily for 5 days and ritonavir 100 mg one tablet twice daily for 5 days) Patient GFR is 77    pantoprazole (PROTONIX) 40 MG tablet TAKE 1 TABLET BY MOUTH EVERY DAY    Polyethylene Glycol POWD Take 17 g by mouth daily. (Patient taking differently: Take 17 g by mouth daily as needed (constipation.).)    rizatriptan (MAXALT-MLT) 10 MG disintegrating tablet Take 1 tablet (10 mg total) by mouth as needed for migraine. May repeat in 2 hours if  needed    sertraline (ZOLOFT) 100 MG tablet Take 1.5 tablets (150 mg total) by mouth daily.    topiramate (TOPAMAX) 50 MG tablet TAKE 1 TABLET(50 MG) BY MOUTH TWICE DAILY    No facility-administered encounter medications on file as of 04/01/2022.    Surgical History: Past Surgical History:  Procedure Laterality Date   ABDOMINAL HYSTERECTOMY  2003   baclofen pump insertion Right 01/04/2018   CARPAL TUNNEL RELEASE Right 1997   CESAREAN SECTION  1989   COLONOSCOPY WITH PROPOFOL N/A 10/28/2020   Procedure: COLONOSCOPY WITH PROPOFOL;  Surgeon: [name], MD;  [facility];  Service: Endoscopy;  Laterality: N/A;  PRIORITY 4 port a  cath   CYSTECTOMY  2002   From chest   ESOPHAGOGASTRODUODENOSCOPY (EGD) WITH PROPOFOL N/A 05/19/2016   Procedure: ESOPHAGOGASTRODUODENOSCOPY (EGD) WITH PROPOFOL;  Surgeon: [name], MD;  Location: [facility];  Service: Endoscopy;  Laterality: N/A;   IR IMAGING GUIDED PORT INSERTION  06/24/2020   KIDNEY DONATION Left 2002   OVARIAN CYST REMOVAL Left 2002   Ovarian mass removal   TONSILLECTOMY  1973   UPPER GASTROINTESTINAL ENDOSCOPY  11/19/2014   Benign appearing esophageal stricture-Dilated.    Medical History: Past Medical History:  Diagnosis Date   Constipation    Cystitis bacillary, chronic    Depression    Dysphagia    Dx by Beverely Risen on 10/15/14   Eosinophilic esophagitis 11/19/2014   GE junction benign stricture s/p balloon dilation, retained food contents. ?gastroparesis versus secondary to EE.   Esophageal dysphagia    GERD (gastroesophageal reflux disease)    Heartburn    Hypertension    Migraine headache    "couple times each month"   Multilevel degenerative disc disease    Multiple sclerosis (HCC) 07/16/2015   Port-A-Cath in place    right side   Presence of intrathecal baclofen pump    right abdomen   Vertigo    Rare - none in last 6 months    Family History: Family History  Problem Relation Age of Onset   Diabetes Mother    Hypertension Mother    Heart disease Mother    Pulmonary embolism Mother    Diabetes Father    Diabetes Sister    Hypertension Sister    Heart disease Sister    Bipolar disorder Sister    Narcolepsy Brother    Heart attack Maternal Grandmother    Pancreatic cancer Maternal Grandfather    Breast cancer Maternal Aunt     Social History   Socioeconomic History   Marital status: Married    Spouse name: [name]   Number of children: 1   Years of education: Not on file   Highest education level: Not on file  Occupational History   Occupation: Disability  Tobacco Use   Smoking status: Never   Smokeless tobacco: Never   Vaping Use   Vaping Use: Never used  Substance and Sexual Activity   Alcohol use: No    Alcohol/week: 0.0 standard drinks    Comment: rare wine on Holidays   Drug use: No   Sexual activity: Not Currently  Other Topics Concern   Not on file  Social History Narrative   Lives with husband, Aliyyah Riese   Right handed    Caffeine use: tea sometimes (mostly herbal)   Social Determinants of Health   Financial Resource Strain: Low Risk    Difficulty of Paying Living Expenses: Not very hard  Food Insecurity: Not on  file  Transportation Needs: Not on file  Physical Activity: Not on file  Stress: Not on file  Social Connections: Not on file  Intimate Partner Violence: Not on file      Review of Systems  Constitutional:  Positive for chills, fatigue and fever.  HENT:  Positive for congestion, postnasal drip, rhinorrhea, sinus pressure, sinus pain and sneezing.   Respiratory:  Positive for cough. Negative for chest tightness, shortness of breath and wheezing.   Cardiovascular: Negative.  Negative for chest pain and palpitations.  Gastrointestinal: Negative.   Musculoskeletal:  Positive for myalgias.  Skin: Negative.   Neurological:  Positive for headaches.   Vital Signs: Temp 98.4 F (36.9 C)   Resp 16   Ht 5\' 3"  (1.6 m)   Wt 198 lb (89.8 kg)   BMI 35.07 kg/m    Observation/Objective: She is alert and oriented and engages in conversation appropriately.  She does not appear to be in any acute distress over video call.    Assessment/Plan: 1. Upper respiratory tract infection due to COVID-19 virus Positive covid test, paxlovid prescribed, this was helpful for the patient in December when she had covid then as well.  - nirmatrelvir/ritonavir EUA (PAXLOVID) 20 x 150 MG & 10 x 100MG  TABS; Take 3 tablets by mouth 2 (two) times daily for 5 days. (Take nirmatrelvir 150 mg two tablets twice daily for 5 days and ritonavir 100 mg one tablet twice daily for 5 days) Patient GFR is 77   Dispense: 30 tablet; Refill: 0  2. Acute cough Tussionex prescribed to alleviate cough.  - chlorpheniramine-HYDROcodone (TUSSIONEX PENNKINETIC ER) 10-8 MG/5ML; Take 5 mLs by mouth every 12 (twelve) hours as needed for cough.  Dispense: 140 mL; Refill: 0   General Counseling: Shelbylynn verbalizes understanding of the findings of today's phone visit and agrees with plan of treatment. I have discussed any further diagnostic evaluation that may be needed or ordered today. We also reviewed her medications today. she has been encouraged to call the office with any questions or concerns that should arise related to todays visit.  Return if symptoms worsen or fail to improve.   No orders of the defined types were placed in this encounter.   Meds ordered this encounter  Medications   nirmatrelvir/ritonavir EUA (PAXLOVID) 20 x 150 MG & 10 x 100MG  TABS    Sig: Take 3 tablets by mouth 2 (two) times daily for 5 days. (Take nirmatrelvir 150 mg two tablets twice daily for 5 days and ritonavir 100 mg one tablet twice daily for 5 days) Patient GFR is 77    Dispense:  30 tablet    Refill:  0   chlorpheniramine-HYDROcodone (TUSSIONEX PENNKINETIC ER) 10-8 MG/5ML    Sig: Take 5 mLs by mouth every 12 (twelve) hours as needed for cough.    Dispense:  140 mL    Refill:  0   Return if symptoms worsen or fail to improve.   Time spent:10 Minutes Time spent with patient included reviewing progress notes, labs, imaging studies, and discussing plan for follow up.  [place] Controlled Substance Database was reviewed by me for overdose risk score (ORS) if appropriate.  This patient was seen by January, FNP-C in collaboration with Dr. as a part of collaborative care agreement.  Haiden Rawlinson R. , MSN, FNP-C Internal medicine

## 2022-04-06 ENCOUNTER — Ambulatory Visit (INDEPENDENT_AMBULATORY_CARE_PROVIDER_SITE_OTHER): Payer: Medicare Other

## 2022-04-06 ENCOUNTER — Ambulatory Visit: Payer: Medicare Other

## 2022-04-06 DIAGNOSIS — E042 Nontoxic multinodular goiter: Secondary | ICD-10-CM

## 2022-04-06 DIAGNOSIS — E538 Deficiency of other specified B group vitamins: Secondary | ICD-10-CM

## 2022-04-06 DIAGNOSIS — G35 Multiple sclerosis: Secondary | ICD-10-CM | POA: Diagnosis not present

## 2022-04-06 MED ORDER — CYANOCOBALAMIN 1000 MCG/ML IJ SOLN
1000.0000 ug | Freq: Once | INTRAMUSCULAR | Status: AC
  Administered 2022-04-06: 1000 ug via INTRAMUSCULAR

## 2022-04-13 ENCOUNTER — Ambulatory Visit (INDEPENDENT_AMBULATORY_CARE_PROVIDER_SITE_OTHER): Payer: Medicare Other

## 2022-04-13 DIAGNOSIS — E538 Deficiency of other specified B group vitamins: Secondary | ICD-10-CM

## 2022-04-13 DIAGNOSIS — G35 Multiple sclerosis: Secondary | ICD-10-CM | POA: Diagnosis not present

## 2022-04-13 MED ORDER — CYANOCOBALAMIN 1000 MCG/ML IJ SOLN
1000.0000 ug | Freq: Once | INTRAMUSCULAR | Status: AC
  Administered 2022-04-13: 1000 ug via INTRAMUSCULAR

## 2022-04-20 ENCOUNTER — Ambulatory Visit: Payer: Medicare Other

## 2022-04-20 DIAGNOSIS — G35 Multiple sclerosis: Secondary | ICD-10-CM | POA: Diagnosis not present

## 2022-04-24 ENCOUNTER — Ambulatory Visit (INDEPENDENT_AMBULATORY_CARE_PROVIDER_SITE_OTHER): Payer: Medicare Other | Admitting: Nurse Practitioner

## 2022-04-24 ENCOUNTER — Encounter: Payer: Self-pay | Admitting: Nurse Practitioner

## 2022-04-24 VITALS — BP 127/76 | HR 81 | Temp 98.2°F | Resp 16 | Ht 63.0 in | Wt 201.2 lb

## 2022-04-24 DIAGNOSIS — E042 Nontoxic multinodular goiter: Secondary | ICD-10-CM | POA: Diagnosis not present

## 2022-04-24 DIAGNOSIS — E782 Mixed hyperlipidemia: Secondary | ICD-10-CM

## 2022-04-24 DIAGNOSIS — E538 Deficiency of other specified B group vitamins: Secondary | ICD-10-CM | POA: Diagnosis not present

## 2022-04-24 DIAGNOSIS — Z23 Encounter for immunization: Secondary | ICD-10-CM | POA: Diagnosis not present

## 2022-04-24 DIAGNOSIS — J029 Acute pharyngitis, unspecified: Secondary | ICD-10-CM | POA: Diagnosis not present

## 2022-04-24 DIAGNOSIS — G35 Multiple sclerosis: Secondary | ICD-10-CM

## 2022-04-24 MED ORDER — ZOSTER VAC RECOMB ADJUVANTED 50 MCG/0.5ML IM SUSR: 0.5000 mL | Freq: Once | INTRAMUSCULAR | 0 refills | Status: AC

## 2022-04-24 MED ORDER — CYANOCOBALAMIN 1000 MCG/ML IJ SOLN
1000.0000 ug | Freq: Once | INTRAMUSCULAR | Status: AC
  Administered 2022-04-24: 1000 ug via INTRAMUSCULAR

## 2022-04-24 MED ORDER — CYANOCOBALAMIN 1000 MCG/ML IJ SOLN: 1000.0000 ug | INTRAMUSCULAR | 0 refills | Status: AC

## 2022-04-24 MED ORDER — "SYRINGE 22G X 1"" 3 ML MISC": 0 refills | Status: DC

## 2022-04-24 NOTE — Progress Notes (Signed)
[facility] [place],  [address]  Internal MEDICINE  Office Visit Note  Patient Name: Rose Clements  [id number]  [id number]  Date of Service: 04/24/2022  Chief Complaint  Patient presents with   Follow-up   Gastroesophageal Reflux   Hypertension   Results   Cough    Wants to have throat checked for swollen glands, had covid about 3 weeks ago   Sore Throat   Depression    HPI Rose Clements presents for a follow-up visit to discuss her thyroid ultrasound.  She also reports she has been having a sore throat and wants to have her throat checked for swollen glands and reports she had COVID about 3 weeks ago. Discussed thyroid ultrasound: -Overall size of the thyroid is mildly increased.  1 nodule in the right thyroid lobe is scored a T RADS 2.  There is another nodule in the left thyroid lobe that measures less than 1 cm in diameter that is scored as a T RADS 4 and follow-up with a repeat thyroid ultrasound in 1 year is recommended. Her routine labs were also discussed today: --Her B12 level is significantly low at 179.  Folate level is normal --Vitamin D level is low normal at 33.9 --Thyroid levels are normal --Metabolic panel is grossly normal, kidney and liver function are normal --CBC is grossly normal except for an elevated absolute lymphocyte count of 4.2 which is consistent with prior CBC labs and she sees a specialist --Lipid panel is abnormal with an elevated total cholesterol of 267 and LDL of 178.  VLDL is 9, HDL 80, triglycerides 61 which are all within normal limits.  Her cholesterol/HDL ratio is 3.3 which is half the average risk of developing cardiovascular disease.   Current Medication: Outpatient Encounter Medications as of 04/24/2022  Medication Sig Note   acetaminophen (TYLENOL) 500 MG tablet Take 500 mg by mouth every 6 (six) hours as needed (for pain.).    ALPRAZolam (XANAX) 0.25 MG tablet Take 1 tablet (0.25 mg total) by mouth 2 (two)  times daily as needed (panic attacks).    baclofen (LIORESAL) 10 MG tablet Take 10 mg by mouth See admin instructions. Take 1 tablet (10 mg) by mouth scheduled in the morning & evening, may take an additional dose at bedtime if needed for spasms.    cetirizine (ZYRTEC) 10 MG tablet Take 10 mg by mouth daily.    chlorpheniramine-HYDROcodone (TUSSIONEX PENNKINETIC ER) 10-8 MG/5ML Take 5 mLs by mouth every 12 (twelve) hours as needed for cough.    Cholecalciferol (VITAMIN D3) 1000 UNITS CAPS Take 1,000 Units by mouth in the morning and at bedtime.     cyanocobalamin (,VITAMIN B-12,) 1000 MCG/ML injection Inject 1 mL (1,000 mcg total) into the muscle every 30 (thirty) days for 3 doses.    desmopressin (DDAVP) 0.1 MG tablet Take 1 tablet (0.1 mg total) by mouth daily.    fluticasone (FLONASE) 50 MCG/ACT nasal spray SPRAY 2 SPRAYS INTO EACH NOSTRIL EVERY DAY    imipramine (TOFRANIL) 25 MG tablet Take 1 tablet (25 mg total) by mouth at bedtime.    lamoTRIgine (LAMICTAL) 25 MG tablet Two pills po bid    modafinil (PROVIGIL) 200 MG tablet Take 1 tablet (200 mg total) by mouth daily.    natalizumab (TYSABRI) 300 MG/15ML injection Inject 300 mg into the vein every 30 (thirty) days.  11/18/2020: JCV ab drawn on 11/11/20 negative, index: 0.15    pantoprazole (PROTONIX) 40 MG tablet TAKE 1 TABLET  BY MOUTH EVERY DAY    Polyethylene Glycol POWD Take 17 g by mouth daily. (Patient taking differently: Take 17 g by mouth daily as needed (constipation.).)    rizatriptan (MAXALT-MLT) 10 MG disintegrating tablet Take 1 tablet (10 mg total) by mouth as needed for migraine. May repeat in 2 hours if needed    sertraline (ZOLOFT) 100 MG tablet Take 1.5 tablets (150 mg total) by mouth daily.    Syringe/Needle, Disp, (SYRINGE 3CC/22GX1") 22G X 1" 3 ML MISC Use 1 syringe with needle to administer B12 injection once monthly x3 months    topiramate (TOPAMAX) 50 MG tablet TAKE 1 TABLET(50 MG) BY MOUTH TWICE DAILY     [DISCONTINUED] Zoster Vaccine Adjuvanted [facility]) injection Inject 0.5 mLs into the muscle once.    Zoster Vaccine Adjuvanted [facility]) injection Inject 0.5 mLs into the muscle once for 1 dose.    Facility-Administered Encounter Medications as of 04/24/2022  Medication   cyanocobalamin ((VITAMIN B-12)) injection 1,000 mcg    Surgical History: Past Surgical History:  Procedure Laterality Date   ABDOMINAL HYSTERECTOMY  2003   baclofen pump insertion Right 01/04/2018   CARPAL TUNNEL RELEASE Right 1997   CESAREAN SECTION  1989   COLONOSCOPY WITH PROPOFOL N/A 10/28/2020   Procedure: COLONOSCOPY WITH PROPOFOL;  Surgeon: Lucilla Lame, MD;  Location: [place];  Service: Endoscopy;  Laterality: N/A;  PRIORITY 4 port a cath   CYSTECTOMY  2002   From chest   ESOPHAGOGASTRODUODENOSCOPY (EGD) WITH PROPOFOL N/A 05/19/2016   Procedure: ESOPHAGOGASTRODUODENOSCOPY (EGD) WITH PROPOFOL;  Surgeon: Lucilla Lame, MD;  Location: [facility];  Service: Endoscopy;  Laterality: N/A;   IR IMAGING GUIDED PORT INSERTION  06/24/2020   KIDNEY DONATION Left 2002   OVARIAN CYST REMOVAL Left 2002   Ovarian mass removal   TONSILLECTOMY  1973   UPPER GASTROINTESTINAL ENDOSCOPY  11/19/2014   Benign appearing esophageal stricture-Dilated.    Medical History: Past Medical History:  Diagnosis Date   Constipation    Cystitis bacillary, chronic    Depression    Dysphagia    Dx by Clayborn Bigness on [id number]   Eosinophilic esophagitis [id number]   GE junction benign stricture s/p balloon dilation, retained food contents. ?gastroparesis versus secondary to EE.   Esophageal dysphagia    GERD (gastroesophageal reflux disease)    Heartburn    Hypertension    Migraine headache    "couple times each month"   Multilevel degenerative disc disease    Multiple sclerosis ([place]) 07/16/2015   Port-A-Cath in place    right side   Presence of intrathecal baclofen pump    right abdomen   Vertigo    Rare - none in  last 6 months    Family History: Family History  Problem Relation Age of Onset   Diabetes Mother    Hypertension Mother    Heart disease Mother    Pulmonary embolism Mother    Diabetes Father    Diabetes Sister    Hypertension Sister    Heart disease Sister    Bipolar disorder Sister    Narcolepsy Brother    Heart attack Maternal Grandmother    Pancreatic cancer Maternal Grandfather    Breast cancer Maternal Aunt     Social History   Socioeconomic History   Marital status: Married    Spouse name: [name]   Number of children: 1   Years of education: Not on file   Highest education level: Not on file  Occupational History  Occupation: Disability  Tobacco Use   Smoking status: Never   Smokeless tobacco: Never  Vaping Use   Vaping Use: Never used  Substance and Sexual Activity   Alcohol use: No    Alcohol/week: 0.0 standard drinks    Comment: rare wine on Holidays   Drug use: No   Sexual activity: Not Currently  Other Topics Concern   Not on file  Social History Narrative   Lives with husband, Jazlyn Gergely   Right handed    Caffeine use: tea sometimes (mostly herbal)   Social Determinants of Health   Financial Resource Strain: Low Risk    Difficulty of Paying Living Expenses: Not very hard  Food Insecurity: Not on file  Transportation Needs: Not on file  Physical Activity: Not on file  Stress: Not on file  Social Connections: Not on file  Intimate Partner Violence: Not on file      Review of Systems  Constitutional:  Negative for chills, fatigue and unexpected weight change.  HENT:  Positive for sore throat. Negative for congestion, rhinorrhea and sneezing.   Eyes:  Negative for redness.  Respiratory:  Negative for cough, chest tightness and shortness of breath.   Cardiovascular:  Negative for chest pain and palpitations.  Gastrointestinal:  Negative for abdominal pain, constipation, diarrhea, nausea and vomiting.  Genitourinary:  Negative for  dysuria and frequency.  Musculoskeletal:  Negative for arthralgias, back pain, joint swelling and neck pain.  Skin:  Negative for rash.  Neurological: Negative.  Negative for tremors and numbness.  Hematological:  Negative for adenopathy. Does not bruise/bleed easily.  Psychiatric/Behavioral:  Negative for behavioral problems (Depression), sleep disturbance and suicidal ideas. The patient is not nervous/anxious.     Vital Signs: BP 127/76   Pulse 81   Temp 98.2 F (36.8 C)   Resp 16   Ht 5\' 3"  (1.6 m)   Wt 201 lb 3.2 oz (91.3 kg)   SpO2 98%   BMI 35.64 kg/m    Physical Exam Vitals reviewed.  Constitutional:      General: She is not in acute distress.    Appearance: Normal appearance. She is obese. She is not ill-appearing.  HENT:     Head: Normocephalic and atraumatic.     Right Ear: Tympanic membrane, ear canal and external ear normal.     Left Ear: Tympanic membrane, ear canal and external ear normal.     Nose: Nose normal. No congestion or rhinorrhea.     Mouth/Throat:     Mouth: Mucous membranes are moist.     Pharynx: Oropharynx is clear. No oropharyngeal exudate or posterior oropharyngeal erythema.  Eyes:     Pupils: Pupils are equal, round, and reactive to light.  Cardiovascular:     Rate and Rhythm: Normal rate and regular rhythm.     Heart sounds: No murmur heard. Pulmonary:     Effort: Pulmonary effort is normal. No respiratory distress.     Breath sounds: Normal breath sounds. No wheezing.  Musculoskeletal:     Cervical back: Neck supple. No tenderness.  Lymphadenopathy:     Cervical: No cervical adenopathy.  Neurological:     Mental Status: She is alert and oriented to person, place, and time.  Psychiatric:        Mood and Affect: Mood normal.        Behavior: Behavior normal.        Assessment/Plan: 1. Multiple thyroid nodules Thyroid ultrasound was discussed and is stable at this time, annual  follow-up thyroid ultrasound is recommended so we  will repeat the ultrasound in a year  2. B12 deficiency B12 injection administered today in the office and prescription sent to her pharmacy for B12 injections and syringes for her to do them at home - cyanocobalamin ((VITAMIN B-12)) injection 1,000 mcg - cyanocobalamin (,VITAMIN B-12,) 1000 MCG/ML injection; Inject 1 mL (1,000 mcg total) into the muscle every 30 (thirty) days for 3 doses.  Dispense: 3 mL; Refill: 0 - Syringe/Needle, Disp, (SYRINGE 3CC/22GX1") 22G X 1" 3 ML MISC; Use 1 syringe with needle to administer B12 injection once monthly x3 months  Dispense: 3 each; Refill: 0  3. Mixed hyperlipidemia Significantly elevated LDL and total cholesterol levels, discussed diet and lifestyle modifications to help improve her cholesterol levels.  For now patient would like to try diet and lifestyle modifications first, will need to discuss statin therapy in the future.  4. Multiple sclerosis ([place]) Followed by neurology  5. Sore throat Most likely just residual from the COVID infection she recently had and/or symptom of postnasal drip from allergies  6. Need for vaccination - Zoster Vaccine Adjuvanted [facility]) injection; Inject 0.5 mLs into the muscle once for 1 dose.  Dispense: 0.5 mL; Refill: 0   General Counseling: Judeen verbalizes understanding of the findings of todays visit and agrees with plan of treatment. I have discussed any further diagnostic evaluation that may be needed or ordered today. We also reviewed her medications today. she has been encouraged to call the office with any questions or concerns that should arise related to todays visit.    No orders of the defined types were placed in this encounter.   Meds ordered this encounter  Medications   Zoster Vaccine Adjuvanted [facility]) injection    Sig: Inject 0.5 mLs into the muscle once for 1 dose.    Dispense:  0.5 mL    Refill:  0   cyanocobalamin ((VITAMIN B-12)) injection 1,000 mcg   cyanocobalamin (,VITAMIN  B-12,) 1000 MCG/ML injection    Sig: Inject 1 mL (1,000 mcg total) into the muscle every 30 (thirty) days for 3 doses.    Dispense:  3 mL    Refill:  0   Syringe/Needle, Disp, (SYRINGE 3CC/22GX1") 22G X 1" 3 ML MISC    Sig: Use 1 syringe with needle to administer B12 injection once monthly x3 months    Dispense:  3 each    Refill:  0    Fill prescription with B12 prescription    Return for follow in september b12 deficiency.   Total time spent:30 Minutes Time spent includes review of chart, medications, test results, and follow up plan with the patient.   [place] Controlled Substance Database was reviewed by me.  This patient was seen by Jonetta Osgood, FNP-C in collaboration with Dr. Clayborn Bigness as a part of collaborative care agreement.   Alejo Beamer R. Valetta Fuller, MSN, FNP-C Internal medicine

## 2022-04-26 ENCOUNTER — Encounter: Payer: Self-pay | Admitting: Nurse Practitioner

## 2022-04-27 ENCOUNTER — Encounter: Payer: Self-pay | Admitting: Neurology

## 2022-04-27 ENCOUNTER — Other Ambulatory Visit (INDEPENDENT_AMBULATORY_CARE_PROVIDER_SITE_OTHER): Payer: Self-pay

## 2022-04-27 ENCOUNTER — Telehealth: Payer: Self-pay | Admitting: *Deleted

## 2022-04-27 ENCOUNTER — Ambulatory Visit (INDEPENDENT_AMBULATORY_CARE_PROVIDER_SITE_OTHER): Payer: Medicare Other | Admitting: Neurology

## 2022-04-27 ENCOUNTER — Other Ambulatory Visit: Payer: Self-pay | Admitting: *Deleted

## 2022-04-27 DIAGNOSIS — Z79899 Other long term (current) drug therapy: Secondary | ICD-10-CM

## 2022-04-27 DIAGNOSIS — G5712 Meralgia paresthetica, left lower limb: Secondary | ICD-10-CM

## 2022-04-27 DIAGNOSIS — G35 Multiple sclerosis: Secondary | ICD-10-CM

## 2022-04-27 DIAGNOSIS — Z0289 Encounter for other administrative examinations: Secondary | ICD-10-CM

## 2022-04-27 DIAGNOSIS — R208 Other disturbances of skin sensation: Secondary | ICD-10-CM

## 2022-04-27 NOTE — Telephone Encounter (Signed)
Placed JCV lab in [facility] lock box for routine lab pick up. Results pending. 

## 2022-04-27 NOTE — Progress Notes (Signed)
[facility]  PATIENT: Rose Clements DOB: Mar 12, 1961     HISTORICAL  CHIEF COMPLAINT:  No chief complaint on file.   HISTORY OF PRESENT ILLNESS:  Start a couple weeks ago, she began to experience numbness and some discomfort in the left anterolateral thigh.  She continues to experience the numbness though it has become a little bit more uncomfortable over the last week.  She notes sharp pain at times.  Other times it does not hurt.  Currently, she does not think the pain is bad enough to begin a medication.  In the past, she has tried gabapentin for neuropathic type pain but had difficulty tolerating it.  She denies any weakness or change in bladder since the onset.  Rubbing against the area causes some allodynia with increased pain.  Nothing really makes it better.  She feels her MS is stable and she continues on Tysabri.     ALLERGIES: Allergies  Allergen Reactions   Lisinopril Hives    HOME MEDICATIONS:  Current Outpatient Medications:    acetaminophen (TYLENOL) 500 MG tablet, Take 500 mg by mouth every 6 (six) hours as needed (for pain.)., Disp: , Rfl:    ALPRAZolam (XANAX) 0.25 MG tablet, Take 1 tablet (0.25 mg total) by mouth 2 (two) times daily as needed (panic attacks)., Disp: 30 tablet, Rfl: 1   baclofen (LIORESAL) 10 MG tablet, Take 10 mg by mouth See admin instructions. Take 1 tablet (10 mg) by mouth scheduled in the morning & evening, may take an additional dose at bedtime if needed for spasms., Disp: , Rfl:    cetirizine (ZYRTEC) 10 MG tablet, Take 10 mg by mouth daily., Disp: , Rfl:    chlorpheniramine-HYDROcodone (TUSSIONEX PENNKINETIC ER) 10-8 MG/5ML, Take 5 mLs by mouth every 12 (twelve) hours as needed for cough., Disp: 140 mL, Rfl: 0   Cholecalciferol (VITAMIN D3) 1000 UNITS CAPS, Take 1,000 Units by mouth in the morning and at bedtime. , Disp: , Rfl:    cyanocobalamin (,VITAMIN B-12,) 1000 MCG/ML injection, Inject 1 mL (1,000 mcg total)  into the muscle every 30 (thirty) days for 3 doses., Disp: 3 mL, Rfl: 0   desmopressin (DDAVP) 0.1 MG tablet, Take 1 tablet (0.1 mg total) by mouth daily., Disp: 30 tablet, Rfl: 11   fluticasone (FLONASE) 50 MCG/ACT nasal spray, SPRAY 2 SPRAYS INTO EACH NOSTRIL EVERY DAY, Disp: 48 mL, Rfl: 2   imipramine (TOFRANIL) 25 MG tablet, Take 1 tablet (25 mg total) by mouth at bedtime., Disp: 30 tablet, Rfl: 3   lamoTRIgine (LAMICTAL) 25 MG tablet, Two pills po bid, Disp: 120 tablet, Rfl: 5   modafinil (PROVIGIL) 200 MG tablet, Take 1 tablet (200 mg total) by mouth daily., Disp: 30 tablet, Rfl: 5   natalizumab (TYSABRI) 300 MG/15ML injection, Inject 300 mg into the vein every 30 (thirty) days. , Disp: , Rfl:    pantoprazole (PROTONIX) 40 MG tablet, TAKE 1 TABLET BY MOUTH EVERY DAY, Disp: 90 tablet, Rfl: 1   Polyethylene Glycol POWD, Take 17 g by mouth daily. (Patient taking differently: Take 17 g by mouth daily as needed (constipation.).), Disp: 527 g, Rfl: 12   rizatriptan (MAXALT-MLT) 10 MG disintegrating tablet, Take 1 tablet (10 mg total) by mouth as needed for migraine. May repeat in 2 hours if needed, Disp: 9 tablet, Rfl: 11   sertraline (ZOLOFT) 100 MG tablet, Take 1.5 tablets (150 mg total) by mouth daily., Disp: 135 tablet, Rfl: 3   Syringe/Needle, Disp, (SYRINGE 3CC/22GX1")  22G X 1" 3 ML MISC, Use 1 syringe with needle to administer B12 injection once monthly x3 months, Disp: 3 each, Rfl: 0   topiramate (TOPAMAX) 50 MG tablet, TAKE 1 TABLET(50 MG) BY MOUTH TWICE DAILY, Disp: 180 tablet, Rfl: 1  PAST MEDICAL HISTORY: Past Medical History:  Diagnosis Date   Constipation    Cystitis bacillary, chronic    Depression    Dysphagia    Dx by Beverely Risen on 10/15/14   Eosinophilic esophagitis 11/19/2014   GE junction benign stricture s/p balloon dilation, retained food contents. ?gastroparesis versus secondary to EE.   Esophageal dysphagia    GERD (gastroesophageal reflux disease)    Heartburn     Hypertension    Migraine headache    "couple times each month"   Multilevel degenerative disc disease    Multiple sclerosis ([facility]) 07/16/2015   Port-A-Cath in place    right side   Presence of intrathecal baclofen pump    right abdomen   Vertigo    Rare - none in last 6 months    PAST SURGICAL HISTORY: Past Surgical History:  Procedure Laterality Date   ABDOMINAL HYSTERECTOMY  2003   baclofen pump insertion Right 01/04/2018   CARPAL TUNNEL RELEASE Right 1997   CESAREAN SECTION  1989   COLONOSCOPY WITH PROPOFOL N/A 10/28/2020   Procedure: COLONOSCOPY WITH PROPOFOL;  Surgeon: [name], MD;  Location[facility];  Service: Endoscopy;  Laterality: N/A;  PRIORITY 4 port a cath   CYSTECTOMY  2002   From chest   ESOPHAGOGASTRODUODENOSCOPY (EGD) WITH PROPOFOL N/A 05/19/2016   Procedure: ESOPHAGOGASTRODUODENOSCOPY (EGD) WITH PROPOFOL;  Surgeon: [name], MD;  Location: [facility];  Service: Endoscopy;  Laterality: N/A;   IR IMAGING GUIDED PORT INSERTION  06/24/2020   KIDNEY DONATION Left 2002   OVARIAN CYST REMOVAL Left 2002   Ovarian mass removal   TONSILLECTOMY  1973   UPPER GASTROINTESTINAL ENDOSCOPY  11/19/2014   Benign appearing esophageal stricture-Dilated.    FAMILY HISTORY: Family History  Problem Relation Age of Onset   Diabetes Mother    Hypertension Mother    Heart disease Mother    Pulmonary embolism Mother    Diabetes Father    Diabetes Sister    Hypertension Sister    Heart disease Sister    Bipolar disorder Sister    Narcolepsy Brother    Heart attack Maternal Grandmother    Pancreatic cancer Maternal Grandfather    Breast cancer Maternal Aunt     SOCIAL HISTORY:  Social History   Socioeconomic History   Marital status: Married    Spouse name: [name]   Number of children: 1   Years of education: Not on file   Highest education level: Not on file  Occupational History   Occupation: Disability  Tobacco Use   Smoking status: Never    Smokeless tobacco: Never  Vaping Use   Vaping Use: Never used  Substance and Sexual Activity   Alcohol use: No    Alcohol/week: 0.0 standard drinks    Comment: rare wine on Holidays   Drug use: No   Sexual activity: Not Currently  Other Topics Concern   Not on file  Social History Narrative   Lives with husband, Rose Clements   Right handed    Caffeine use: tea sometimes (mostly herbal)   Social Determinants of Health   Financial Resource Strain: Low Risk    Difficulty of Paying Living Expenses: Not very hard  Food Insecurity: Not  on file  Transportation Needs: Not on file  Physical Activity: Not on file  Stress: Not on file  Social Connections: Not on file  Intimate Partner Violence: Not on file     PHYSICAL EXAM   General: The patient is well-developed and well-nourished and in no acute distress  Skin: Extremities are without rash or edema.  Musculoskeletal: No tenderness over the left hip or muscles of the left leg  Neurologic Exam  Mental status: The patient is alert and oriented x 3 at the time of the examination. The patient has apparent normal recent and remote memory, with an apparently normal attention span and concentration ability.   Speech is normal.  Cranial nerves: Extraocular movements are full.  Facial strength was normal.  Motor:  Muscle bulk is normal.   Tone is normal. Strength is  5 / 5 in all 4 extremities.   Sensory: Sensory testing shows altered sensation to touch in the distribution of the lateral femoral cutaneous nerve on the left.  She has symmetric sensation elsewhere.  Coordination: Cerebellar testing reveals good finger-nose-finger and heel-to-shin bilaterally.    DIAGNOSTIC DATA (LABS, IMAGING, TESTING) - I reviewed patient records, labs, notes, testing and imaging myself where available.  Lab Results  Component Value Date   WBC 6.8 03/31/2022   HGB 12.4 03/31/2022   HCT 37.4 03/31/2022   MCV 92 03/31/2022   PLT 285  03/31/2022      Component Value Date/Time   NA 143 03/31/2022 1006   K 4.2 03/31/2022 1006   CL 106 03/31/2022 1006   CO2 18 (L) 03/31/2022 1006   GLUCOSE 93 03/31/2022 1006   GLUCOSE 101 (H) 02/15/2019 1500   BUN 10 03/31/2022 1006   CREATININE 0.87 03/31/2022 1006   CALCIUM 9.5 03/31/2022 1006   PROT 6.5 03/31/2022 1006   ALBUMIN 4.5 03/31/2022 1006   AST 13 03/31/2022 1006   ALT 8 03/31/2022 1006   ALKPHOS 96 03/31/2022 1006   BILITOT 0.2 03/31/2022 1006   GFRNONAA 66 02/07/2020 1050   GFRAA 76 02/07/2020 1050   Lab Results  Component Value Date   CHOL 267 (H) 03/31/2022   HDL 80 03/31/2022   LDLCALC 178 (H) 03/31/2022   TRIG 61 03/31/2022   CHOLHDL 3.3 03/31/2022   No results found for: HGBA1C Lab Results  Component Value Date   VITAMINB12 179 (L) 03/31/2022   Lab Results  Component Value Date   TSH 2.320 03/31/2022       ASSESSMENT AND PLAN  Lateral femoral cutaneous neuropathy, left  Multiple sclerosis ([facility])  Dysesthesia   she does not feel the dysesthesia is bad enough to treat at this time.  If it worsens we will begin lamotrigine 25 mg and titrate to 50 mg twice a day or higher.  She had not been able to tolerate gabapentin in the past. Continue other medications for MS Return as scheduled.  Call with new or worsening symptoms  Zephaniah Enyeart A. Epimenio Foot, MD, PhD, [place] 04/27/2022, 4:59 PM Certified in Neurology, Clinical Neurophysiology, Sleep Medicine, Pain Medicine and Neuroimaging  [facility] [address] [place], [place] [address] [phone]

## 2022-04-28 LAB — CBC WITH DIFFERENTIAL/PLATELET
Basophils Absolute: 0.1 10*3/uL (ref 0.0–0.2)
Basos: 1 %
EOS (ABSOLUTE): 0.2 10*3/uL (ref 0.0–0.4)
Eos: 3 %
Hematocrit: 34.7 % (ref 34.0–46.6)
Hemoglobin: 11.2 g/dL (ref 11.1–15.9)
Immature Grans (Abs): 0 10*3/uL (ref 0.0–0.1)
Immature Granulocytes: 0 %
Lymphocytes Absolute: 3.8 10*3/uL — ABNORMAL HIGH (ref 0.7–3.1)
Lymphs: 64 %
MCH: 29.6 pg (ref 26.6–33.0)
MCHC: 32.3 g/dL (ref 31.5–35.7)
MCV: 92 fL (ref 79–97)
Monocytes Absolute: 0.5 10*3/uL (ref 0.1–0.9)
Monocytes: 8 %
NRBC: 1 % — ABNORMAL HIGH (ref 0–0)
Neutrophils Absolute: 1.4 10*3/uL (ref 1.4–7.0)
Neutrophils: 24 %
Platelets: 237 10*3/uL (ref 150–450)
RBC: 3.78 x10E6/uL (ref 3.77–5.28)
RDW: 12.4 % (ref 11.7–15.4)
WBC: 5.9 10*3/uL (ref 3.4–10.8)

## 2022-05-02 ENCOUNTER — Encounter: Payer: Self-pay | Admitting: Nurse Practitioner

## 2022-05-04 DIAGNOSIS — G35 Multiple sclerosis: Secondary | ICD-10-CM | POA: Diagnosis not present

## 2022-05-06 LAB — STRATIFY JCV AB (W/ INDEX) W/ RFLX
Index Value: 0.15
Stratify JCV (TM) Ab w/Reflex Inhibition: NEGATIVE

## 2022-05-07 DIAGNOSIS — G35 Multiple sclerosis: Secondary | ICD-10-CM | POA: Diagnosis not present

## 2022-05-13 ENCOUNTER — Ambulatory Visit: Payer: Medicare Other | Admitting: Family Medicine

## 2022-05-14 ENCOUNTER — Telehealth: Payer: Self-pay | Admitting: *Deleted

## 2022-05-14 DIAGNOSIS — G35 Multiple sclerosis: Secondary | ICD-10-CM | POA: Diagnosis not present

## 2022-05-14 NOTE — Telephone Encounter (Signed)
Received this email: Hello, patient Rose Clements  (1961-01-08) will be in [place], [place] until August and would like to infuse at [facility]. Can you please have her orders faxed over to [phone]?  [facility] [address] [place], [place] [address]  Thank you, Forestine Chute Sr. Case ManagerPatient Services [employer] [address] [place], [place] [address] [email or web address] [phone] ext. [phone]"  I emailed back and asked if auth needs to be done and if they will transfer pt. Her response: "She's visiting her daughter for a few months and wants to get set up at [facility] to infuse. We'll get her transferred there and switch her back to your office in September. They just need her orders to get her set up."  I faxed back signed order from Dr. Epimenio Foot at [phone]. Received fax confirmation.

## 2022-05-21 DIAGNOSIS — G35 Multiple sclerosis: Secondary | ICD-10-CM | POA: Diagnosis not present

## 2022-05-25 ENCOUNTER — Encounter: Payer: Self-pay | Admitting: Neurology

## 2022-05-25 ENCOUNTER — Encounter: Payer: Self-pay | Admitting: *Deleted

## 2022-05-25 DIAGNOSIS — G35 Multiple sclerosis: Secondary | ICD-10-CM | POA: Diagnosis not present

## 2022-05-25 NOTE — Telephone Encounter (Signed)
[facility]) Pt having an appt today, we requesting updated office notes sent.  Fax: [phone]

## 2022-05-28 DIAGNOSIS — G35 Multiple sclerosis: Secondary | ICD-10-CM | POA: Diagnosis not present

## 2022-06-04 DIAGNOSIS — G35 Multiple sclerosis: Secondary | ICD-10-CM | POA: Diagnosis not present

## 2022-06-05 DIAGNOSIS — S0990XA Unspecified injury of head, initial encounter: Secondary | ICD-10-CM | POA: Diagnosis not present

## 2022-06-05 DIAGNOSIS — S0181XA Laceration without foreign body of other part of head, initial encounter: Secondary | ICD-10-CM | POA: Diagnosis not present

## 2022-06-06 DIAGNOSIS — G35 Multiple sclerosis: Secondary | ICD-10-CM | POA: Diagnosis not present

## 2022-06-13 DIAGNOSIS — G35 Multiple sclerosis: Secondary | ICD-10-CM | POA: Diagnosis not present

## 2022-06-15 ENCOUNTER — Encounter: Payer: Self-pay | Admitting: Nurse Practitioner

## 2022-06-15 NOTE — Telephone Encounter (Signed)
Received the following email 06/15/22 from Tisha/[employer]: "Hello Rose Clements, [facility] you had a wonderful weekend. We've switched patient D. E. 1961/03/03 to a new site location in [place], [place]. Can you please fax over her Tysabri orders (including port details), demographics, and insurance information to the site listed below. Please let me know if you have any questions or concerns.  [facility] [address] [place], [place] [address] [phone] (p) [phone] (f)  Thank you, Tisha"  Faxed order to above fax#, received fax confirmation. Emailed Tisha back that request is complete.

## 2022-06-17 ENCOUNTER — Encounter: Payer: Self-pay | Admitting: Neurology

## 2022-06-20 DIAGNOSIS — G35 Multiple sclerosis: Secondary | ICD-10-CM | POA: Diagnosis not present

## 2022-06-22 DIAGNOSIS — G35 Multiple sclerosis: Secondary | ICD-10-CM | POA: Diagnosis not present

## 2022-06-27 DIAGNOSIS — G35 Multiple sclerosis: Secondary | ICD-10-CM | POA: Diagnosis not present

## 2022-07-04 DIAGNOSIS — G35 Multiple sclerosis: Secondary | ICD-10-CM | POA: Diagnosis not present

## 2022-07-07 DIAGNOSIS — G35 Multiple sclerosis: Secondary | ICD-10-CM | POA: Diagnosis not present

## 2022-07-08 ENCOUNTER — Telehealth: Payer: Medicare Other

## 2022-07-14 DIAGNOSIS — G35 Multiple sclerosis: Secondary | ICD-10-CM | POA: Diagnosis not present

## 2022-07-20 DIAGNOSIS — G35 Multiple sclerosis: Secondary | ICD-10-CM | POA: Diagnosis not present

## 2022-07-21 DIAGNOSIS — G35 Multiple sclerosis: Secondary | ICD-10-CM | POA: Diagnosis not present

## 2022-07-22 ENCOUNTER — Telehealth: Payer: Self-pay | Admitting: Neurology

## 2022-07-23 NOTE — Telephone Encounter (Signed)
Note created in error.

## 2022-07-28 DIAGNOSIS — G35 Multiple sclerosis: Secondary | ICD-10-CM | POA: Diagnosis not present

## 2022-08-04 DIAGNOSIS — G35 Multiple sclerosis: Secondary | ICD-10-CM | POA: Diagnosis not present

## 2022-08-07 DIAGNOSIS — G35 Multiple sclerosis: Secondary | ICD-10-CM | POA: Diagnosis not present

## 2022-08-14 DIAGNOSIS — G35 Multiple sclerosis: Secondary | ICD-10-CM | POA: Diagnosis not present

## 2022-08-17 ENCOUNTER — Other Ambulatory Visit: Payer: Self-pay | Admitting: Neurology

## 2022-08-17 ENCOUNTER — Other Ambulatory Visit: Payer: Self-pay | Admitting: Nurse Practitioner

## 2022-08-17 DIAGNOSIS — K219 Gastro-esophageal reflux disease without esophagitis: Secondary | ICD-10-CM

## 2022-08-18 DIAGNOSIS — G35 Multiple sclerosis: Secondary | ICD-10-CM | POA: Diagnosis not present

## 2022-08-20 ENCOUNTER — Ambulatory Visit: Payer: Medicare Other | Admitting: Neurology

## 2022-08-20 ENCOUNTER — Encounter: Payer: Self-pay | Admitting: Neurology

## 2022-08-20 VITALS — BP 139/77 | HR 65 | Ht 65.0 in | Wt 205.0 lb

## 2022-08-20 DIAGNOSIS — N3941 Urge incontinence: Secondary | ICD-10-CM

## 2022-08-20 DIAGNOSIS — Z79899 Other long term (current) drug therapy: Secondary | ICD-10-CM

## 2022-08-20 DIAGNOSIS — G43009 Migraine without aura, not intractable, without status migrainosus: Secondary | ICD-10-CM | POA: Diagnosis not present

## 2022-08-20 DIAGNOSIS — G35 Multiple sclerosis: Secondary | ICD-10-CM

## 2022-08-20 DIAGNOSIS — R262 Difficulty in walking, not elsewhere classified: Secondary | ICD-10-CM

## 2022-08-20 DIAGNOSIS — R208 Other disturbances of skin sensation: Secondary | ICD-10-CM

## 2022-08-20 DIAGNOSIS — Z978 Presence of other specified devices: Secondary | ICD-10-CM | POA: Diagnosis not present

## 2022-08-20 MED ORDER — DESMOPRESSIN ACETATE 0.1 MG PO TABS: 0.1000 mg | ORAL_TABLET | Freq: Every day | ORAL | 3 refills | Status: DC

## 2022-08-20 MED ORDER — BACLOFEN 10 MG PO TABS: 10.0000 mg | ORAL_TABLET | ORAL | 4 refills | Status: DC

## 2022-08-20 NOTE — Progress Notes (Signed)
[facility]  PATIENT: Rose Clements DOB: 1961/01/30  REFERRING DOCTOR OR PCP:  Beverely Risen, MD SOURCE: Patient, notes from primary care and neurology, MRI and laboratory reports, MRI images personally reviewed.  _________________________________   HISTORICAL  CHIEF COMPLAINT:  Chief Complaint  Patient presents with   Follow-up    Pt in rm #2 and alone. Pt state yesterday she had a fall but she didn't hit her head. Pt state last month she fell twice. Pt state one time while visit her daughter she had to go the th urgent care in [facility] for stitches. Pt state she had headaches after the falls.Pt state she passing out on the porch in July due to the heat, pt state she was drinking alcohol. Pt state her legs at tines feels stiff.    Rose Clements is a 61 y.o. woman with relapsing multiple sclerosis.    Update 08/20/2022 She was getting her Tysabri infusions in [place] x 3 months near [place].      JCV Ab was negative (0.15  04/27/2022).   She has tolerated Tysabri very well.  She has not had any exacerbations.  Unfortunately, she had some slow progression of some of her symptoms.  She had a recent fall requiring stitches.   Both legs are weak, worse on ight and give out at times.   She also has right hand weakness.  .Balance is mildly reduced.   She notes right > left leg weakness and stiffness.   It gives out at times.   She uses a walker.   She can go 200 + feet with her walker   She seldom uses the cane outside the home.  She has a right greater than left foot drop in the right f  She also has edema, left greater than right.  She has some tingling in legs bilaterally, left > right.   Also left LFCN pain.  Lamotrigine has helped.   She has stiffness in her legs bilaterally.  Baclofen helps her some.   She does not become sleepy.      She has vertigo.      DDAVP helps her nocturia    Imipramine had not helped the bladder,  Because she was a kidney donor, she has only 1 kidney.   However, creatinine and BUN have remained in the normal levels over the last few years.    She has a baclofen pump  since 2019.   Dose is low (104 mcg/day at 4.3 / hr).     She was switched to higher concentration so only needs a refill twice a year now.   She still takes rare 10 mg baclofen pills as needed when she notes more tightness in her legs.    She has fatigue.  She tried Modafinil and felt it dried her out and did not help much.   She sleeps poorly at night.      Migraines are better with topiramate and imipramine.  She gets headaches some nights.    She has a headache about 1/week.   There are migrainous features with nausea, photophobia/phonopobia.   She takes Tylenol when one occurs which usually helps.   She has never tried a triptan though we prescribed Maxalt.    She had an U/S thyroid and has two colloid cysts.    She reports labs are stable   She has fatigue and sleeiness.     Modafinil helped some but she stopped    She does not  sleep well.   She snores but does not have OSA.    She reports PSG with Dr. Freda Munro in [place] showed narcolepsy.  She does not described cataplexy symptoms     She has vivid dreams and occasional hypnagogic hallucinations and sleep paralysis.    MS HIstory:  She  was diagnosed with relapsing remitting MS in 2011 after she presented with stiffness in her left arm and a clumsy gait with some falls.   In retrospect some of the leg symptoms started a little earlier.    She had MRI's and LP with lesions and CSF findings consistent with MS.    She was placed on Avonex.   She did ok initially but then had an exacerbation with more issues with her legs.   She was started on Tysabri about 2016 after these issues.   She has done well with Tysabri and has not had any exacerbations.   Initially, she was diagnosed and treated in [place].  She moved to [facility] in 2016 and has seen Dr. Ronalee Red at [facility] since that time.  She transferred care to [place]  mid 2021.    IMAGING: MRI of the cervical spine 03/07/2018 and 05/15/2018 and 01/19/2020 all show a couple T2 hyperintense lesions within the spinal cord.  One to the left adjacent to C2-C3 and 1 to the right adjacent to C4.    Also has right thyroid cyst.   MRI of the brain 05/15/2018 and 03/07/2018 and 2/212/2021 show multiple T2/flair hyperintense foci.  Somewhat nonspecific but a few are periventricular and juxtacortical.  There is no change between the 3 studies.     MRI of the thoracic spine 05/16/2015 did not show any definite lesions.   Hemangiomas within the T2 and T6 vertebral body.    REVIEW OF SYSTEMS: Constitutional: No fevers, chills, sweats, or change in appetite Eyes: No visual changes, double vision, eye pain Ear, nose and throat: No hearing loss, ear pain, nasal congestion, sore throat Cardiovascular: No chest pain, palpitations Respiratory:  No shortness of breath at rest or with exertion.   No wheezes GastrointestinaI: No nausea, vomiting, diarrhea, abdominal pain, fecal incontinence Genitourinary:  No dysuria, urinary retention or frequency.  No nocturia. Musculoskeletal:  No neck pain, back pain Integumentary: No rash, pruritus, skin lesions Neurological: as above Psychiatric: No depression at this time.  No anxiety Endocrine: No palpitations, diaphoresis, change in appetite, change in weigh or increased thirst Hematologic/Lymphatic:  No anemia, purpura, petechiae. Allergic/Immunologic: No itchy/runny eyes, nasal congestion, recent allergic reactions, rashes  ALLERGIES: Allergies  Allergen Reactions   Lisinopril Hives    HOME MEDICATIONS:  Current Outpatient Medications:    acetaminophen (TYLENOL) 500 MG tablet, Take 500 mg by mouth every 6 (six) hours as needed (for pain.)., Disp: , Rfl:    ALPRAZolam (XANAX) 0.25 MG tablet, Take 1 tablet (0.25 mg total) by mouth 2 (two) times daily as needed (panic attacks)., Disp: 30 tablet, Rfl: 1   cetirizine (ZYRTEC) 10 MG  tablet, Take 10 mg by mouth daily., Disp: , Rfl:    chlorpheniramine-HYDROcodone (TUSSIONEX PENNKINETIC ER) 10-8 MG/5ML, Take 5 mLs by mouth every 12 (twelve) hours as needed for cough., Disp: 140 mL, Rfl: 0   Cholecalciferol (VITAMIN D3) 1000 UNITS CAPS, Take 1,000 Units by mouth in the morning and at bedtime. , Disp: , Rfl:    fluticasone (FLONASE) 50 MCG/ACT nasal spray, SPRAY 2 SPRAYS INTO EACH NOSTRIL EVERY DAY, Disp: 48 mL, Rfl: 2  lamoTRIgine (LAMICTAL) 25 MG tablet, TAKE 2 TABLETS BY MOUTH TWICE DAILY, Disp: 120 tablet, Rfl: 5   modafinil (PROVIGIL) 200 MG tablet, Take 1 tablet (200 mg total) by mouth daily., Disp: 30 tablet, Rfl: 5   natalizumab (TYSABRI) 300 MG/15ML injection, Inject 300 mg into the vein every 30 (thirty) days. , Disp: , Rfl:    pantoprazole (PROTONIX) 40 MG tablet, TAKE 1 TABLET BY MOUTH EVERY DAY, Disp: 90 tablet, Rfl: 1   Polyethylene Glycol POWD, Take 17 g by mouth daily. (Patient taking differently: Take 17 g by mouth daily as needed (constipation.).), Disp: 527 g, Rfl: 12   rizatriptan (MAXALT-MLT) 10 MG disintegrating tablet, Take 1 tablet (10 mg total) by mouth as needed for migraine. May repeat in 2 hours if needed, Disp: 9 tablet, Rfl: 11   sertraline (ZOLOFT) 100 MG tablet, Take 1.5 tablets (150 mg total) by mouth daily., Disp: 135 tablet, Rfl: 3   Syringe/Needle, Disp, (SYRINGE 3CC/22GX1") 22G X 1" 3 ML MISC, Use 1 syringe with needle to administer B12 injection once monthly x3 months, Disp: 3 each, Rfl: 0   topiramate (TOPAMAX) 50 MG tablet, TAKE 1 TABLET(50 MG) BY MOUTH TWICE DAILY, Disp: 180 tablet, Rfl: 1   baclofen (LIORESAL) 10 MG tablet, Take 1 tablet (10 mg total) by mouth See admin instructions. Take 1 tablet (10 mg) by mouth scheduled in the morning & evening, may take an additional dose at bedtime if needed for spasms., Disp: 270 each, Rfl: 4   desmopressin (DDAVP) 0.1 MG tablet, Take 1 tablet (0.1 mg total) by mouth daily., Disp: 90 tablet, Rfl: 3    imipramine (TOFRANIL) 25 MG tablet, Take 1 tablet (25 mg total) by mouth at bedtime. (Patient not taking: Reported on 08/20/2022), Disp: 30 tablet, Rfl: 3  PAST MEDICAL HISTORY: Past Medical History:  Diagnosis Date   Constipation    Cystitis bacillary, chronic    Depression    Dysphagia    Dx by Beverely Risen on 10/15/14   Eosinophilic esophagitis 11/19/2014   GE junction benign stricture s/p balloon dilation, retained food contents. ?gastroparesis versus secondary to EE.   Esophageal dysphagia    GERD (gastroesophageal reflux disease)    Heartburn    Hypertension    Migraine headache    "couple times each month"   Multilevel degenerative disc disease    Multiple sclerosis (HCC) 07/16/2015   Port-A-Cath in place    right side   Presence of intrathecal baclofen pump    right abdomen   Vertigo    Rare - none in last 6 months    PAST SURGICAL HISTORY: Past Surgical History:  Procedure Laterality Date   ABDOMINAL HYSTERECTOMY  2003   baclofen pump insertion Right 01/04/2018   CARPAL TUNNEL RELEASE Right 1997   CESAREAN SECTION  1989   COLONOSCOPY WITH PROPOFOL N/A 10/28/2020   Procedure: COLONOSCOPY WITH PROPOFOL;  Surgeon: [name], MD;  Location: [facility];  Service: Endoscopy;  Laterality: N/A;  PRIORITY 4 port a cath   CYSTECTOMY  2002   From chest   ESOPHAGOGASTRODUODENOSCOPY (EGD) WITH PROPOFOL N/A 05/19/2016   Procedure: ESOPHAGOGASTRODUODENOSCOPY (EGD) WITH PROPOFOL;  Surgeon: [name], MD;  Location: [facility];  Service: Endoscopy;  Laterality: N/A;   IR IMAGING GUIDED PORT INSERTION  06/24/2020   KIDNEY DONATION Left 2002   OVARIAN CYST REMOVAL Left 2002   Ovarian mass removal   TONSILLECTOMY  1973   UPPER GASTROINTESTINAL ENDOSCOPY  11/19/2014   Benign appearing  esophageal stricture-Dilated.    FAMILY HISTORY: Family History  Problem Relation Age of Onset   Diabetes Mother    Hypertension Mother    Heart disease Mother    Pulmonary  embolism Mother    Diabetes Father    Diabetes Sister    Hypertension Sister    Heart disease Sister    Bipolar disorder Sister    Narcolepsy Brother    Heart attack Maternal Grandmother    Pancreatic cancer Maternal Grandfather    Breast cancer Maternal Aunt     SOCIAL HISTORY:  Social History   Socioeconomic History   Marital status: Married    Spouse name: [name]   Number of children: 1   Years of education: Not on file   Highest education level: Not on file  Occupational History   Occupation: Disability  Tobacco Use   Smoking status: Never   Smokeless tobacco: Never  Vaping Use   Vaping Use: Never used  Substance and Sexual Activity   Alcohol use: No    Alcohol/week: 0.0 standard drinks of alcohol    Comment: rare wine on Holidays   Drug use: No   Sexual activity: Not Currently  Other Topics Concern   Not on file  Social History Narrative   Lives with husband, Concetta Dangelo   Right handed    Caffeine use: tea sometimes (mostly herbal)   Social Determinants of Health   Financial Resource Strain: Low Risk  (05/15/2021)   Overall Financial Resource Strain (CARDIA)    Difficulty of Paying Living Expenses: Not very hard  Food Insecurity: Not on file  Transportation Needs: Not on file  Physical Activity: Not on file  Stress: Not on file  Social Connections: Not on file  Intimate Partner Violence: Not on file     PHYSICAL EXAM  Vitals:   08/20/22 1541  BP: 139/77  Pulse: 65  Weight: 205 lb (93 kg)  Height: 5\' 5"  (1.651 m)    Body mass index is 34.11 kg/m.   General: The patient is well-developed and well-nourished and in no acute distress  Skin: Extremities are without rash or edema.   Neurologic Exam  Mental status: The patient is alert and oriented x 3 at the time of the examination. The patient has apparent normal recent and remote memory, with an apparently normal attention span and concentration ability.   Speech is normal.  Cranial nerves:  Extraocular movements are full.  Facial strength was normal.  No obvious hearing deficits are noted.  Motor:  Muscle bulk is normal.   She has increased muscle tone in legs.  Muscle tone is increased, left greater than right leg.  She has reduced rapid alternating movements in the left arm and strength was 4/5 in the triceps and 5/5 elsewhere.  Strength was 4 /5 in both legs  Sensory: Sensory testing is intact to pinprick, soft touch and vibration sensation in all 4 extremities.  Coordination: Cerebellar testing reveals good finger-nose-finger and slightly reduced heel-to-shin bilaterally.  Gait and station: Station is normal.   She is able to walk without support but the gait is spastic, wide with a very reduced stride.  Her stride is quite a bit better with the walker.  She is unable to tandem walk.  Romberg is negative.  Reflexes: Deep tendon reflexes are increased in the legs, left greater than right and there are crossed abductor reflexes.  Slight asymmetry in the arms.     DIAGNOSTIC DATA (LABS, IMAGING, TESTING) - I  reviewed patient records, labs, notes, testing and imaging myself where available.  Lab Results  Component Value Date   WBC 5.9 04/27/2022   HGB 11.2 04/27/2022   HCT 34.7 04/27/2022   MCV 92 04/27/2022   PLT 237 04/27/2022      Component Value Date/Time   NA 143 03/31/2022 1006   K 4.2 03/31/2022 1006   CL 106 03/31/2022 1006   CO2 18 (L) 03/31/2022 1006   GLUCOSE 93 03/31/2022 1006   GLUCOSE 101 (H) 02/15/2019 1500   BUN 10 03/31/2022 1006   CREATININE 0.87 03/31/2022 1006   CALCIUM 9.5 03/31/2022 1006   PROT 6.5 03/31/2022 1006   ALBUMIN 4.5 03/31/2022 1006   AST 13 03/31/2022 1006   ALT 8 03/31/2022 1006   ALKPHOS 96 03/31/2022 1006   BILITOT 0.2 03/31/2022 1006   GFRNONAA 66 02/07/2020 1050   GFRAA 76 02/07/2020 1050   Lab Results  Component Value Date   CHOL 267 (H) 03/31/2022   HDL 80 03/31/2022   LDLCALC 178 (H) 03/31/2022   TRIG 61  03/31/2022   CHOLHDL 3.3 03/31/2022   No results found for: "HGBA1C" Lab Results  Component Value Date   VITAMINB12 179 (L) 03/31/2022   Lab Results  Component Value Date   TSH 2.320 03/31/2022       ASSESSMENT AND PLAN  Multiple sclerosis (HCC)  High risk medication use  Dysesthesia  Urge incontinence  Migraine without aura and without status migrainosus, not intractable  Difficulty in walking  Presence of intrathecal pump  1.   Continue Tysabri infusions.   JCV antibody was negative in May.  She now has a central line.      2.    Continue topiramate and imipramine for migraine and maxalt prn.   If it is not tolerated or does not help, try Nurtec or Ubrelvy if needed. 3.   Stay active and exercise as tolerated. 4.     Renew low-dose desmopressin at night to try to help the nocturia.  Because she has only 1 kidney, we will keep the dose low. 5.   She has a baclofen pump. --- only 104 mcg/day, we discussed mild increase due to spasicity but because of weakness we can't increase much.   [facility] is now doing.     6.   Continue lamotrigine for dysethesia. 7.    Return for follow-up in 6 months  or call sooner if new or worsening neurologic symptoms.   40-minute office visit with the majority of the time spent face-to-face for history and physical, discussion/counseling and decision-making.  Additional time with record review and documentation.    Alaine Loughney A. Epimenio Foot, MD, [facility] 08/20/2022, 4:09 PM Certified in Neurology, Clinical Neurophysiology, Sleep Medicine and Neuroimaging  [facility] [address], [address] [place], [place] [address] [phone]

## 2022-08-21 DIAGNOSIS — G35 Multiple sclerosis: Secondary | ICD-10-CM | POA: Diagnosis not present

## 2022-08-24 ENCOUNTER — Ambulatory Visit (INDEPENDENT_AMBULATORY_CARE_PROVIDER_SITE_OTHER): Payer: Medicare Other | Admitting: Nurse Practitioner

## 2022-08-24 ENCOUNTER — Encounter: Payer: Self-pay | Admitting: Nurse Practitioner

## 2022-08-24 VITALS — BP 127/72 | HR 64 | Temp 97.8°F | Resp 16 | Ht 65.0 in | Wt 204.0 lb

## 2022-08-24 DIAGNOSIS — E538 Deficiency of other specified B group vitamins: Secondary | ICD-10-CM | POA: Diagnosis not present

## 2022-08-24 DIAGNOSIS — G35 Multiple sclerosis: Secondary | ICD-10-CM | POA: Diagnosis not present

## 2022-08-24 DIAGNOSIS — F331 Major depressive disorder, recurrent, moderate: Secondary | ICD-10-CM | POA: Diagnosis not present

## 2022-08-24 DIAGNOSIS — Z23 Encounter for immunization: Secondary | ICD-10-CM

## 2022-08-24 MED ORDER — ZOSTER VAC RECOMB ADJUVANTED 50 MCG/0.5ML IM SUSR: 0.5000 mL | Freq: Once | INTRAMUSCULAR | 0 refills | Status: AC

## 2022-08-24 MED ORDER — VENLAFAXINE HCL ER 37.5 MG PO CP24: 37.5000 mg | ORAL_CAPSULE | Freq: Every day | ORAL | 2 refills | Status: DC

## 2022-08-24 NOTE — Progress Notes (Signed)
[facility], [place] [address]  Internal MEDICINE  Office Visit Note  Patient Name: Rose Clements  [id number]  [id number]  Date of Service: 08/24/2022  Chief Complaint  Patient presents with  . Follow-up  . Depression  . Gastroesophageal Reflux  . Hypertension    HPI Clayton presents for a follow up visit for hypertension, depression, MS and frequent falls.  Visiting with family, came home, has been falling more  Passed out while visit daughter -- probably from heat.  Multiple falls, discussed with neurology said her MS is progressing.  --BP controlled with current medications --depression due to chronic medical conditions.    Current Medication: Outpatient Encounter Medications as of 08/24/2022  Medication Sig Note  . venlafaxine XR (EFFEXOR-XR) 37.5 MG 24 hr capsule Take 1 capsule (37.5 mg total) by mouth daily with breakfast.   . [DISCONTINUED] Zoster Vaccine Adjuvanted [facility]) injection Inject 0.5 mLs into the muscle once.   Marland Kitchen acetaminophen (TYLENOL) 500 MG tablet Take 500 mg by mouth every 6 (six) hours as needed (for pain.).   Marland Kitchen ALPRAZolam (XANAX) 0.25 MG tablet Take 1 tablet (0.25 mg total) by mouth 2 (two) times daily as needed (panic attacks).   . baclofen (LIORESAL) 10 MG tablet Take 1 tablet (10 mg total) by mouth See admin instructions. Take 1 tablet (10 mg) by mouth scheduled in the morning & evening, may take an additional dose at bedtime if needed for spasms.   . cetirizine (ZYRTEC) 10 MG tablet Take 10 mg by mouth daily.   . chlorpheniramine-HYDROcodone (TUSSIONEX PENNKINETIC ER) 10-8 MG/5ML Take 5 mLs by mouth every 12 (twelve) hours as needed for cough.   . Cholecalciferol (VITAMIN D3) 1000 UNITS CAPS Take 1,000 Units by mouth in the morning and at bedtime.    Marland Kitchen desmopressin (DDAVP) 0.1 MG tablet Take 1 tablet (0.1 mg total) by mouth daily.   . fluticasone (FLONASE) 50 MCG/ACT nasal spray SPRAY 2 SPRAYS INTO EACH NOSTRIL EVERY  DAY   . lamoTRIgine (LAMICTAL) 25 MG tablet TAKE 2 TABLETS BY MOUTH TWICE DAILY   . modafinil (PROVIGIL) 200 MG tablet Take 1 tablet (200 mg total) by mouth daily.   . natalizumab (TYSABRI) 300 MG/15ML injection Inject 300 mg into the vein every 30 (thirty) days.  11/18/2020: JCV ab drawn on 11/11/20 negative, index: 0.15   . pantoprazole (PROTONIX) 40 MG tablet TAKE 1 TABLET BY MOUTH EVERY DAY   . Polyethylene Glycol POWD Take 17 g by mouth daily. (Patient taking differently: Take 17 g by mouth daily as needed (constipation.).)   . rizatriptan (MAXALT-MLT) 10 MG disintegrating tablet Take 1 tablet (10 mg total) by mouth as needed for migraine. May repeat in 2 hours if needed   . sertraline (ZOLOFT) 100 MG tablet Take 1.5 tablets (150 mg total) by mouth daily.   . Syringe/Needle, Disp, (SYRINGE 3CC/22GX1") 22G X 1" 3 ML MISC Use 1 syringe with needle to administer B12 injection once monthly x3 months   . topiramate (TOPAMAX) 50 MG tablet TAKE 1 TABLET(50 MG) BY MOUTH TWICE DAILY   . Zoster Vaccine Adjuvanted [facility]) injection Inject 0.5 mLs into the muscle once for 1 dose.   . [DISCONTINUED] imipramine (TOFRANIL) 25 MG tablet Take 1 tablet (25 mg total) by mouth at bedtime. (Patient not taking: Reported on 08/20/2022)    No facility-administered encounter medications on file as of 08/24/2022.    Surgical History: Past Surgical History:  Procedure Laterality Date  . ABDOMINAL  HYSTERECTOMY  2003  . baclofen pump insertion Right 01/04/2018  . CARPAL TUNNEL RELEASE Right 1997  . CESAREAN SECTION  1989  . COLONOSCOPY WITH PROPOFOL N/A 10/28/2020   Procedure: COLONOSCOPY WITH PROPOFOL;  Surgeon: Lucilla Lame, MD;  Location: [facility];  Service: Endoscopy;  Laterality: N/A;  PRIORITY 4 port a cath  . CYSTECTOMY  2002   From chest  . ESOPHAGOGASTRODUODENOSCOPY (EGD) WITH PROPOFOL N/A 05/19/2016   Procedure: ESOPHAGOGASTRODUODENOSCOPY (EGD) WITH PROPOFOL;  Surgeon: Lucilla Lame, MD;   Location: [facility] ENDOSCOPY;  Service: Endoscopy;  Laterality: N/A;  . IR IMAGING GUIDED PORT INSERTION  06/24/2020  . KIDNEY DONATION Left 2002  . OVARIAN CYST REMOVAL Left 2002   Ovarian mass removal  . TONSILLECTOMY  1973  . UPPER GASTROINTESTINAL ENDOSCOPY  11/19/2014   Benign appearing esophageal stricture-Dilated.    Medical History: Past Medical History:  Diagnosis Date  . Constipation   . Cystitis bacillary, chronic   . Depression   . Dysphagia    Dx by Clayborn Bigness on 10/15/14  . Eosinophilic esophagitis [id number]   GE junction benign stricture s/p balloon dilation, retained food contents. ?gastroparesis versus secondary to EE.  Marland Kitchen Esophageal dysphagia   . GERD (gastroesophageal reflux disease)   . Heartburn   . Hypertension   . Migraine headache    "couple times each month"  . Multilevel degenerative disc disease   . Multiple sclerosis ([place]) 07/16/2015  . Port-A-Cath in place    right side  . Presence of intrathecal baclofen pump    right abdomen  . Vertigo    Rare - none in last 6 months    Family History: Family History  Problem Relation Age of Onset  . Diabetes Mother   . Hypertension Mother   . Heart disease Mother   . Pulmonary embolism Mother   . Diabetes Father   . Diabetes Sister   . Hypertension Sister   . Heart disease Sister   . Bipolar disorder Sister   . Narcolepsy Brother   . Heart attack Maternal Grandmother   . Pancreatic cancer Maternal Grandfather   . Breast cancer Maternal Aunt     Social History   Socioeconomic History  . Marital status: Married    Spouse name: [name]  . Number of children: 1  . Years of education: Not on file  . Highest education level: Not on file  Occupational History  . Occupation: Disability  Tobacco Use  . Smoking status: Never  . Smokeless tobacco: Never  Vaping Use  . Vaping Use: Never used  Substance and Sexual Activity  . Alcohol use: No    Alcohol/week: 0.0 standard drinks of alcohol    Comment:  rare wine on Holidays  . Drug use: No  . Sexual activity: Not Currently  Other Topics Concern  . Not on file  Social History Narrative   Lives with husband, Babara Steeb   Right handed    Caffeine use: tea sometimes (mostly herbal)   Social Determinants of Health   Financial Resource Strain: Low Risk  (05/15/2021)   Overall Financial Resource Strain (CARDIA)   . Difficulty of Paying Living Expenses: Not very hard  Food Insecurity: Not on file  Transportation Needs: Not on file  Physical Activity: Not on file  Stress: Not on file  Social Connections: Not on file  Intimate Partner Violence: Not on file      Review of Systems  Constitutional:  Negative for chills, fatigue and unexpected weight change.  HENT:  Negative for congestion, rhinorrhea and sneezing.   Eyes:  Negative for redness.  Respiratory: Negative.  Negative for cough, chest tightness, shortness of breath and wheezing.   Cardiovascular: Negative.  Negative for chest pain and palpitations.  Gastrointestinal:  Negative for abdominal pain, constipation, diarrhea, nausea and vomiting.  Genitourinary:  Negative for dysuria and frequency.  Musculoskeletal:  Negative for arthralgias, back pain, joint swelling and neck pain.  Skin:  Negative for rash.  Neurological: Negative.  Negative for tremors and numbness.  Hematological:  Negative for adenopathy. Does not bruise/bleed easily.  Psychiatric/Behavioral:  Negative for behavioral problems (Depression), sleep disturbance and suicidal ideas. The patient is not nervous/anxious.     Vital Signs: BP 127/72   Pulse 64   Temp 97.8 F (36.6 C)   Resp 16   Ht 5\' 5"  (1.651 m)   Wt 204 lb (92.5 kg)   SpO2 98%   BMI 33.95 kg/m    Physical Exam     Assessment/Plan:   General Counseling: Ahleah verbalizes understanding of the findings of todays visit and agrees with plan of treatment. I have discussed any further diagnostic evaluation that may be needed or ordered  today. We also reviewed her medications today. she has been encouraged to call the office with any questions or concerns that should arise related to todays visit.    Orders Placed This Encounter  Procedures  . Flu Vaccine MDCK QUAD PF  . B12 and Folate Panel    Meds ordered this encounter  Medications  . Zoster Vaccine Adjuvanted [facility]) injection    Sig: Inject 0.5 mLs into the muscle once for 1 dose.    Dispense:  0.5 mL    Refill:  0  . venlafaxine XR (EFFEXOR-XR) 37.5 MG 24 hr capsule    Sig: Take 1 capsule (37.5 mg total) by mouth daily with breakfast.    Dispense:  30 capsule    Refill:  2    Discontinue sertraline, New prescription, please fill today    Return in about 1 month (around 09/23/2022) for F/U, eval new med, Kaitlinn Iversen PCP, Labs.   Total time spent:30 Minutes Time spent includes review of chart, medications, test results, and follow up plan with the patient.   [place] Controlled Substance Database was reviewed by me.  This patient was seen by Jonetta Osgood, FNP-C in collaboration with Dr. Clayborn Bigness as a part of collaborative care agreement.   Mariell Nester R. Valetta Fuller, MSN, FNP-C Internal medicine

## 2022-08-25 ENCOUNTER — Other Ambulatory Visit: Payer: Self-pay | Admitting: Neurology

## 2022-08-27 NOTE — Telephone Encounter (Signed)
Pt last refill was 03/16/22 and updated in the registry.

## 2022-08-28 DIAGNOSIS — G35 Multiple sclerosis: Secondary | ICD-10-CM | POA: Diagnosis not present

## 2022-09-03 ENCOUNTER — Telehealth: Payer: Self-pay | Admitting: Neurology

## 2022-09-03 NOTE — Telephone Encounter (Signed)
Rose Clements is calling. Stated she was checking to see if provider received order and fax back. Requesting a call back

## 2022-09-03 NOTE — Telephone Encounter (Signed)
We faxed this signed order back this am. I called and spoke w/ Venetia Night. She will let Anda Kraft know we will re-fax.  I re-faxed to [phone]. AttnAnda Kraft. Received fax confirmation.

## 2022-09-04 DIAGNOSIS — G35 Multiple sclerosis: Secondary | ICD-10-CM | POA: Diagnosis not present

## 2022-09-06 DIAGNOSIS — G35 Multiple sclerosis: Secondary | ICD-10-CM | POA: Diagnosis not present

## 2022-09-09 DIAGNOSIS — G35 Multiple sclerosis: Secondary | ICD-10-CM | POA: Diagnosis not present

## 2022-09-09 DIAGNOSIS — M6249 Contracture of muscle, multiple sites: Secondary | ICD-10-CM | POA: Diagnosis not present

## 2022-09-13 DIAGNOSIS — G35 Multiple sclerosis: Secondary | ICD-10-CM | POA: Diagnosis not present

## 2022-09-15 ENCOUNTER — Other Ambulatory Visit (INDEPENDENT_AMBULATORY_CARE_PROVIDER_SITE_OTHER): Payer: Self-pay

## 2022-09-15 ENCOUNTER — Other Ambulatory Visit: Payer: Self-pay | Admitting: *Deleted

## 2022-09-15 DIAGNOSIS — Z0289 Encounter for other administrative examinations: Secondary | ICD-10-CM

## 2022-09-15 DIAGNOSIS — G35 Multiple sclerosis: Secondary | ICD-10-CM | POA: Diagnosis not present

## 2022-09-15 DIAGNOSIS — E538 Deficiency of other specified B group vitamins: Secondary | ICD-10-CM

## 2022-09-16 LAB — VITAMIN B12: Vitamin B-12: 445 pg/mL (ref 232–1245)

## 2022-09-20 DIAGNOSIS — G35 Multiple sclerosis: Secondary | ICD-10-CM | POA: Diagnosis not present

## 2022-09-22 ENCOUNTER — Ambulatory Visit (INDEPENDENT_AMBULATORY_CARE_PROVIDER_SITE_OTHER): Payer: Medicare Other | Admitting: Nurse Practitioner

## 2022-09-22 ENCOUNTER — Encounter: Payer: Self-pay | Admitting: Nurse Practitioner

## 2022-09-22 VITALS — BP 139/80 | HR 74 | Temp 97.6°F | Resp 16 | Ht 65.0 in | Wt 206.0 lb

## 2022-09-22 DIAGNOSIS — E538 Deficiency of other specified B group vitamins: Secondary | ICD-10-CM

## 2022-09-22 DIAGNOSIS — F331 Major depressive disorder, recurrent, moderate: Secondary | ICD-10-CM | POA: Diagnosis not present

## 2022-09-22 DIAGNOSIS — G35 Multiple sclerosis: Secondary | ICD-10-CM

## 2022-09-22 DIAGNOSIS — N951 Menopausal and female climacteric states: Secondary | ICD-10-CM | POA: Diagnosis not present

## 2022-09-22 MED ORDER — VENLAFAXINE HCL ER 75 MG PO CP24: 75.0000 mg | ORAL_CAPSULE | Freq: Every day | ORAL | 2 refills | Status: DC

## 2022-09-22 NOTE — Progress Notes (Signed)
[facility] [place], [place] [address]  Internal MEDICINE  Office Visit Note  Patient Name: Rose Clements  [id number]  [id number]  Date of Service: 09/22/2022  Chief Complaint  Patient presents with   Follow-up    Follow up new med and labs.    Hypertension   Depression   Gastroesophageal Reflux    HPI Rose Clements presents for a follow up visit for hypertension, depression, perimenopause and B12 deficiency.  Perimenopausal symptoms -slightly improved since starting venlafaxine but not optimally controlled. Still having hot flashes Depressive symptoms and anxiety -- improving with venlafaxine, completely weaned off sertraline B12 deficiency -- level is up at 445, wants to stop injections    Current Medication: Outpatient Encounter Medications as of 09/22/2022  Medication Sig Note   acetaminophen (TYLENOL) 500 MG tablet Take 500 mg by mouth every 6 (six) hours as needed (for pain.).    ALPRAZolam (XANAX) 0.25 MG tablet Take 1 tablet (0.25 mg total) by mouth 2 (two) times daily as needed (panic attacks).    baclofen (LIORESAL) 10 MG tablet Take 1 tablet (10 mg total) by mouth See admin instructions. Take 1 tablet (10 mg) by mouth scheduled in the morning & evening, may take an additional dose at bedtime if needed for spasms.    cetirizine (ZYRTEC) 10 MG tablet Take 10 mg by mouth daily.    chlorpheniramine-HYDROcodone (TUSSIONEX PENNKINETIC ER) 10-8 MG/5ML Take 5 mLs by mouth every 12 (twelve) hours as needed for cough.    Cholecalciferol (VITAMIN D3) 1000 UNITS CAPS Take 1,000 Units by mouth in the morning and at bedtime.     desmopressin (DDAVP) 0.1 MG tablet Take 1 tablet (0.1 mg total) by mouth daily.    fluticasone (FLONASE) 50 MCG/ACT nasal spray SPRAY 2 SPRAYS INTO EACH NOSTRIL EVERY DAY    natalizumab (TYSABRI) 300 MG/15ML injection Inject 300 mg into the vein every 30 (thirty) days.  11/18/2020: JCV ab drawn on 11/11/20 negative, index: 0.15     pantoprazole (PROTONIX) 40 MG tablet TAKE 1 TABLET BY MOUTH EVERY DAY    Polyethylene Glycol POWD Take 17 g by mouth daily. (Patient taking differently: Take 17 g by mouth daily as needed (constipation.).)    rizatriptan (MAXALT-MLT) 10 MG disintegrating tablet Take 1 tablet (10 mg total) by mouth as needed for migraine. May repeat in 2 hours if needed    topiramate (TOPAMAX) 50 MG tablet TAKE 1 TABLET(50 MG) BY MOUTH TWICE DAILY    venlafaxine XR (EFFEXOR-XR) 75 MG 24 hr capsule Take 1 capsule (75 mg total) by mouth daily with breakfast.    [DISCONTINUED] lamoTRIgine (LAMICTAL) 25 MG tablet TAKE 2 TABLETS BY MOUTH TWICE DAILY    [DISCONTINUED] modafinil (PROVIGIL) 200 MG tablet TAKE ONE TABLET BY MOUTH ONE TIME DAILY    [DISCONTINUED] sertraline (ZOLOFT) 100 MG tablet Take 1.5 tablets (150 mg total) by mouth daily.    [DISCONTINUED] Syringe/Needle, Disp, (SYRINGE 3CC/22GX1") 22G X 1" 3 ML MISC Use 1 syringe with needle to administer B12 injection once monthly x3 months    [DISCONTINUED] venlafaxine XR (EFFEXOR-XR) 37.5 MG 24 hr capsule Take 1 capsule (37.5 mg total) by mouth daily with breakfast.    No facility-administered encounter medications on file as of 09/22/2022.    Surgical History: Past Surgical History:  Procedure Laterality Date   ABDOMINAL HYSTERECTOMY  2003   baclofen pump insertion Right 01/04/2018   CARPAL TUNNEL RELEASE Right [place]   COLONOSCOPY  WITH PROPOFOL N/A 10/28/2020   Procedure: COLONOSCOPY WITH PROPOFOL;  Surgeon: Lucilla Lame, MD;  Location: [facility];  Service: Endoscopy;  Laterality: N/A;  PRIORITY 4 port a cath   CYSTECTOMY  2002   From chest   ESOPHAGOGASTRODUODENOSCOPY (EGD) WITH PROPOFOL N/A 05/19/2016   Procedure: ESOPHAGOGASTRODUODENOSCOPY (EGD) WITH PROPOFOL;  Surgeon: Lucilla Lame, MD;  Location: [facility];  Service: Endoscopy;  Laterality: N/A;   IR IMAGING GUIDED PORT INSERTION  06/24/2020   KIDNEY DONATION Left  2002   OVARIAN CYST REMOVAL Left 2002   Ovarian mass removal   TONSILLECTOMY  1973   UPPER GASTROINTESTINAL ENDOSCOPY  11/19/2014   Benign appearing esophageal stricture-Dilated.    Medical History: Past Medical History:  Diagnosis Date   Constipation    Cystitis bacillary, chronic    Depression    Dysphagia    Dx by Clayborn Bigness on [id number]   Eosinophilic esophagitis [id number]   GE junction benign stricture s/p balloon dilation, retained food contents. ?gastroparesis versus secondary to EE.   Esophageal dysphagia    GERD (gastroesophageal reflux disease)    Heartburn    Hypertension    Migraine headache    "couple times each month"   Multilevel degenerative disc disease    Multiple sclerosis ([place]) 07/16/2015   Port-A-Cath in place    right side   Presence of intrathecal baclofen pump    right abdomen   Vertigo    Rare - none in last 6 months    Family History: Family History  Problem Relation Age of Onset   Diabetes Mother    Hypertension Mother    Heart disease Mother    Pulmonary embolism Mother    Diabetes Father    Diabetes Sister    Hypertension Sister    Heart disease Sister    Bipolar disorder Sister    Narcolepsy Brother    Heart attack Maternal Grandmother    Pancreatic cancer Maternal Grandfather    Breast cancer Maternal Aunt     Social History   Socioeconomic History   Marital status: Married    Spouse name: Rose Clements   Number of children: 1   Years of education: Not on file   Highest education level: Not on file  Occupational History   Occupation: Disability  Tobacco Use   Smoking status: Never   Smokeless tobacco: Never  Vaping Use   Vaping Use: Never used  Substance and Sexual Activity   Alcohol use: No    Alcohol/week: 0.0 standard drinks of alcohol    Comment: rare wine on Holidays   Drug use: No   Sexual activity: Not Currently  Other Topics Concern   Not on file  Social History Narrative   Lives with husband, Rose Clements    Right handed    Caffeine use: tea sometimes (mostly herbal)   Social Determinants of Health   Financial Resource Strain: Low Risk  (05/15/2021)   Overall Financial Resource Strain (CARDIA)    Difficulty of Paying Living Expenses: Not very hard  Food Insecurity: Not on file  Transportation Needs: Not on file  Physical Activity: Not on file  Stress: Not on file  Social Connections: Not on file  Intimate Partner Violence: Not on file      Review of Systems  Constitutional:  Negative for chills, fatigue and unexpected weight change.  HENT:  Negative for congestion, rhinorrhea, sneezing and sore throat.   Eyes:  Negative for redness.  Respiratory:  Negative for cough, chest tightness  and shortness of breath.   Cardiovascular:  Negative for chest pain and palpitations.  Gastrointestinal:  Negative for abdominal pain, constipation, diarrhea, nausea and vomiting.  Genitourinary:  Negative for dysuria and frequency.  Musculoskeletal:  Negative for arthralgias, back pain, joint swelling and neck pain.  Skin:  Negative for rash.  Neurological: Negative.  Negative for tremors and numbness.  Hematological:  Negative for adenopathy. Does not bruise/bleed easily.  Psychiatric/Behavioral:  Negative for behavioral problems (Depression), sleep disturbance and suicidal ideas. The patient is not nervous/anxious.     Vital Signs: BP 139/80   Pulse 74   Temp 97.6 F (36.4 C)   Resp 16   Ht 5\' 5"  (1.651 m)   Wt 206 lb (93.4 kg)   SpO2 96%   BMI 34.28 kg/m    Physical Exam Vitals reviewed.  Constitutional:      General: She is not in acute distress.    Appearance: Normal appearance. She is obese. She is not ill-appearing.  Eyes:     Pupils: Pupils are equal, round, and reactive to light.  Cardiovascular:     Heart sounds: No murmur heard. Pulmonary:     Effort: Pulmonary effort is normal. No respiratory distress.  Musculoskeletal:     Cervical back: Neck supple. No tenderness.   Lymphadenopathy:     Cervical: No cervical adenopathy.  Neurological:     Mental Status: She is alert and oriented to person, place, and time.  Psychiatric:        Mood and Affect: Mood normal.        Behavior: Behavior normal.        Assessment/Plan: 1. Perimenopausal symptoms Increase venlafaxine dose to 75 mg daily, start tomorrow.  - venlafaxine XR (EFFEXOR-XR) 75 MG 24 hr capsule; Take 1 capsule (75 mg total) by mouth daily with breakfast.  Dispense: 30 capsule; Refill: 2  2. B12 deficiency B12 level stable, may stop injections and start OTC supplement 1000 mcg daily.   3. Multiple sclerosis ([place]) Followed by neurology  4. Major depressive disorder, recurrent episode, moderate ([place]) New med is working but need dose increased, start 75 mg dose tomorrow - venlafaxine XR (EFFEXOR-XR) 75 MG 24 hr capsule; Take 1 capsule (75 mg total) by mouth daily with breakfast.  Dispense: 30 capsule; Refill: 2   General Counseling: Sharisse verbalizes understanding of the findings of todays visit and agrees with plan of treatment. I have discussed any further diagnostic evaluation that may be needed or ordered today. We also reviewed her medications today. she has been encouraged to call the office with any questions or concerns that should arise related to todays visit.    No orders of the defined types were placed in this encounter.   Meds ordered this encounter  Medications   venlafaxine XR (EFFEXOR-XR) 75 MG 24 hr capsule    Sig: Take 1 capsule (75 mg total) by mouth daily with breakfast.    Dispense:  30 capsule    Refill:  2    DISCONTINUE sertraline and discontinue 37.5 mg dose of venlafaxine. Fill new script for venlafaxine 75 mg capsule dose today ASAP. Patient to start increased dose tomorrow morning.    Return in about 3 months (around 12/23/2022) for F/U, Emalene Welte PCP.   Total time spent:30 Minutes Time spent includes review of chart, medications, test results, and follow  up plan with the patient.   [place] Controlled Substance Database was reviewed by me.  This patient was seen by Jonetta Osgood, FNP-C  in collaboration with Dr. Clayborn Bigness as a part of collaborative care agreement.   Kaelin Bonelli R. Valetta Fuller, MSN, FNP-C Internal medicine

## 2022-09-27 DIAGNOSIS — G35 Multiple sclerosis: Secondary | ICD-10-CM | POA: Diagnosis not present

## 2022-10-04 DIAGNOSIS — G35 Multiple sclerosis: Secondary | ICD-10-CM | POA: Diagnosis not present

## 2022-10-07 DIAGNOSIS — G35 Multiple sclerosis: Secondary | ICD-10-CM | POA: Diagnosis not present

## 2022-10-14 ENCOUNTER — Other Ambulatory Visit: Payer: Medicare Other

## 2022-10-14 ENCOUNTER — Other Ambulatory Visit: Payer: Self-pay | Admitting: *Deleted

## 2022-10-14 DIAGNOSIS — Z79899 Other long term (current) drug therapy: Secondary | ICD-10-CM | POA: Diagnosis not present

## 2022-10-14 DIAGNOSIS — G35 Multiple sclerosis: Secondary | ICD-10-CM

## 2022-10-14 NOTE — Progress Notes (Signed)
Placed [facility] lab in [facility] lock box for routine lab pick up. Results pending. 

## 2022-10-15 ENCOUNTER — Encounter: Payer: Self-pay | Admitting: Nurse Practitioner

## 2022-10-15 LAB — CBC WITH DIFFERENTIAL/PLATELET
Basophils Absolute: 0.1 10*3/uL (ref 0.0–0.2)
Basos: 1 %
EOS (ABSOLUTE): 0.2 10*3/uL (ref 0.0–0.4)
Eos: 3 %
Hematocrit: 35 % (ref 34.0–46.6)
Hemoglobin: 11.3 g/dL (ref 11.1–15.9)
Immature Grans (Abs): 0 10*3/uL (ref 0.0–0.1)
Immature Granulocytes: 0 %
Lymphocytes Absolute: 5.1 10*3/uL — ABNORMAL HIGH (ref 0.7–3.1)
Lymphs: 66 %
MCH: 30.1 pg (ref 26.6–33.0)
MCHC: 32.3 g/dL (ref 31.5–35.7)
MCV: 93 fL (ref 79–97)
Monocytes Absolute: 0.5 10*3/uL (ref 0.1–0.9)
Monocytes: 7 %
Neutrophils Absolute: 1.8 10*3/uL (ref 1.4–7.0)
Neutrophils: 23 %
Platelets: 254 10*3/uL (ref 150–450)
RBC: 3.76 x10E6/uL — ABNORMAL LOW (ref 3.77–5.28)
RDW: 12.7 % (ref 11.7–15.4)
WBC: 7.6 10*3/uL (ref 3.4–10.8)

## 2022-10-21 DIAGNOSIS — G35 Multiple sclerosis: Secondary | ICD-10-CM | POA: Diagnosis not present

## 2022-10-26 NOTE — Progress Notes (Signed)
JCV ab drawn on 10/14/22 negative, index: 0.16.

## 2022-10-28 DIAGNOSIS — G35 Multiple sclerosis: Secondary | ICD-10-CM | POA: Diagnosis not present

## 2022-11-04 DIAGNOSIS — G35 Multiple sclerosis: Secondary | ICD-10-CM | POA: Diagnosis not present

## 2022-11-06 DIAGNOSIS — G35 Multiple sclerosis: Secondary | ICD-10-CM | POA: Diagnosis not present

## 2022-11-11 DIAGNOSIS — G35 Multiple sclerosis: Secondary | ICD-10-CM | POA: Diagnosis not present

## 2022-11-13 DIAGNOSIS — G35 Multiple sclerosis: Secondary | ICD-10-CM | POA: Diagnosis not present

## 2022-11-20 DIAGNOSIS — G35 Multiple sclerosis: Secondary | ICD-10-CM | POA: Diagnosis not present

## 2022-11-25 ENCOUNTER — Encounter: Payer: Self-pay | Admitting: Neurology

## 2022-11-25 ENCOUNTER — Other Ambulatory Visit: Payer: Self-pay | Admitting: *Deleted

## 2022-11-25 MED ORDER — TOPIRAMATE 50 MG PO TABS: ORAL_TABLET | ORAL | 0 refills | Status: DC

## 2022-11-26 ENCOUNTER — Other Ambulatory Visit: Payer: Self-pay | Admitting: Nurse Practitioner

## 2022-11-26 DIAGNOSIS — K219 Gastro-esophageal reflux disease without esophagitis: Secondary | ICD-10-CM

## 2022-11-26 DIAGNOSIS — F331 Major depressive disorder, recurrent, moderate: Secondary | ICD-10-CM

## 2022-11-26 DIAGNOSIS — N951 Menopausal and female climacteric states: Secondary | ICD-10-CM

## 2022-11-27 DIAGNOSIS — G35 Multiple sclerosis: Secondary | ICD-10-CM | POA: Diagnosis not present

## 2022-12-02 ENCOUNTER — Encounter: Payer: Self-pay | Admitting: Neurology

## 2022-12-04 DIAGNOSIS — G35 Multiple sclerosis: Secondary | ICD-10-CM | POA: Diagnosis not present

## 2022-12-07 DIAGNOSIS — F32A Depression, unspecified: Secondary | ICD-10-CM

## 2022-12-07 DIAGNOSIS — G35 Multiple sclerosis: Secondary | ICD-10-CM | POA: Diagnosis not present

## 2022-12-07 HISTORY — DX: Depression, unspecified: F32.A

## 2022-12-09 DIAGNOSIS — G35 Multiple sclerosis: Secondary | ICD-10-CM | POA: Diagnosis not present

## 2022-12-14 DIAGNOSIS — G35 Multiple sclerosis: Secondary | ICD-10-CM | POA: Diagnosis not present

## 2022-12-14 DIAGNOSIS — M6249 Contracture of muscle, multiple sites: Secondary | ICD-10-CM | POA: Diagnosis not present

## 2022-12-21 DIAGNOSIS — G35 Multiple sclerosis: Secondary | ICD-10-CM | POA: Diagnosis not present

## 2022-12-22 ENCOUNTER — Encounter: Payer: Self-pay | Admitting: Nurse Practitioner

## 2022-12-22 ENCOUNTER — Ambulatory Visit (INDEPENDENT_AMBULATORY_CARE_PROVIDER_SITE_OTHER): Payer: Medicare Other | Admitting: Nurse Practitioner

## 2022-12-22 VITALS — BP 138/76 | HR 74 | Temp 98.7°F | Resp 16 | Ht 65.0 in | Wt 204.8 lb

## 2022-12-22 DIAGNOSIS — N951 Menopausal and female climacteric states: Secondary | ICD-10-CM

## 2022-12-22 DIAGNOSIS — T7840XS Allergy, unspecified, sequela: Secondary | ICD-10-CM

## 2022-12-22 DIAGNOSIS — G35 Multiple sclerosis: Secondary | ICD-10-CM

## 2022-12-22 DIAGNOSIS — F331 Major depressive disorder, recurrent, moderate: Secondary | ICD-10-CM

## 2022-12-22 DIAGNOSIS — F419 Anxiety disorder, unspecified: Secondary | ICD-10-CM

## 2022-12-22 MED ORDER — VENLAFAXINE HCL ER 150 MG PO CP24: 150.0000 mg | ORAL_CAPSULE | Freq: Every day | ORAL | 1 refills | Status: DC

## 2022-12-22 MED ORDER — FLUTICASONE PROPIONATE 50 MCG/ACT NA SUSP: NASAL | 2 refills | Status: DC

## 2022-12-22 MED ORDER — ALPRAZOLAM 0.25 MG PO TABS: 0.2500 mg | ORAL_TABLET | Freq: Two times a day (BID) | ORAL | 2 refills | Status: DC | PRN

## 2022-12-22 NOTE — Progress Notes (Signed)
[facility] [address] [place], [place] [address]  Internal MEDICINE  Office Visit Note  Patient Name: Rose Clements  [id number]  [id number]  Date of Service: 12/22/2022  Chief Complaint  Patient presents with   Follow-up   Gastroesophageal Reflux   Depression   Hypertension    HPI Rose Clements presents for a follow-up visit for depression, anxiety, MS, and hypertension.  Depression -- wants to increase venlafaxine, is becoming agoraphobic, not wanting to go out especially to [employer].  MS -- managed by neurology.  Anxiety -- need refill of alprazolam.  Hypertension -- controlled with current medication.     Current Medication: Outpatient Encounter Medications as of 12/22/2022  Medication Sig Note   acetaminophen (TYLENOL) 500 MG tablet Take 500 mg by mouth every 6 (six) hours as needed (for pain.).    baclofen (LIORESAL) 10 MG tablet Take 1 tablet (10 mg total) by mouth See admin instructions. Take 1 tablet (10 mg) by mouth scheduled in the morning & evening, may take an additional dose at bedtime if needed for spasms.    cetirizine (ZYRTEC) 10 MG tablet Take 10 mg by mouth daily.    chlorpheniramine-HYDROcodone (TUSSIONEX PENNKINETIC ER) 10-8 MG/5ML Take 5 mLs by mouth every 12 (twelve) hours as needed for cough.    Cholecalciferol (VITAMIN D3) 1000 UNITS CAPS Take 1,000 Units by mouth in the morning and at bedtime.     desmopressin (DDAVP) 0.1 MG tablet Take 1 tablet (0.1 mg total) by mouth daily.    natalizumab (TYSABRI) 300 MG/15ML injection Inject 300 mg into the vein every 30 (thirty) days.  11/18/2020: JCV ab drawn on 11/11/20 negative, index: 0.15    pantoprazole (PROTONIX) 40 MG tablet TAKE 1 TABLET BY MOUTH EVERY DAY    Polyethylene Glycol POWD Take 17 g by mouth daily. (Patient taking differently: Take 17 g by mouth daily as needed (constipation.).)    rizatriptan (MAXALT-MLT) 10 MG disintegrating tablet Take 1 tablet (10 mg total) by mouth as needed for  migraine. May repeat in 2 hours if needed    topiramate (TOPAMAX) 50 MG tablet TAKE 1 TABLET(50 MG) BY MOUTH TWICE DAILY    venlafaxine XR (EFFEXOR-XR) 150 MG 24 hr capsule Take 1 capsule (150 mg total) by mouth daily with breakfast.    [DISCONTINUED] ALPRAZolam (XANAX) 0.25 MG tablet Take 1 tablet (0.25 mg total) by mouth 2 (two) times daily as needed (panic attacks).    [DISCONTINUED] fluticasone (FLONASE) 50 MCG/ACT nasal spray SPRAY 2 SPRAYS INTO EACH NOSTRIL EVERY DAY    [DISCONTINUED] venlafaxine XR (EFFEXOR-XR) 75 MG 24 hr capsule TAKE 1 CAPSULE(75 MG) BY MOUTH DAILY WITH BREAKFAST    ALPRAZolam (XANAX) 0.25 MG tablet Take 1 tablet (0.25 mg total) by mouth 2 (two) times daily as needed (panic attacks).    fluticasone (FLONASE) 50 MCG/ACT nasal spray SPRAY 2 SPRAYS INTO EACH NOSTRIL EVERY DAY    No facility-administered encounter medications on file as of 12/22/2022.    Surgical History: Past Surgical History:  Procedure Laterality Date   ABDOMINAL HYSTERECTOMY  2003   baclofen pump insertion Right 01/04/2018   CARPAL TUNNEL RELEASE Right 1997   CESAREAN SECTION  1989   COLONOSCOPY WITH PROPOFOL N/A 10/28/2020   Procedure: COLONOSCOPY WITH PROPOFOL;  Surgeon: [name], MD;  Location: [facility];  Service: Endoscopy;  Laterality: N/A;  PRIORITY 4 port a cath   CYSTECTOMY  2002   From chest   ESOPHAGOGASTRODUODENOSCOPY (EGD) WITH PROPOFOL N/A 05/19/2016  Procedure: ESOPHAGOGASTRODUODENOSCOPY (EGD) WITH PROPOFOL;  Surgeon: Lucilla Lame, MD;  Location: [facility];  Service: Endoscopy;  Laterality: N/A;   IR IMAGING GUIDED PORT INSERTION  06/24/2020   KIDNEY DONATION Left 2002   OVARIAN CYST REMOVAL Left 2002   Ovarian mass removal   TONSILLECTOMY  1973   UPPER GASTROINTESTINAL ENDOSCOPY  11/19/2014   Benign appearing esophageal stricture-Dilated.    Medical History: Past Medical History:  Diagnosis Date   Constipation    Cystitis bacillary, chronic     Depression    Dysphagia    Dx by Clayborn Bigness on 87/5/64   Eosinophilic esophagitis 33/29/5188   GE junction benign stricture s/p balloon dilation, retained food contents. ?gastroparesis versus secondary to EE.   Esophageal dysphagia    GERD (gastroesophageal reflux disease)    Heartburn    Hypertension    Migraine headache    "couple times each month"   Multilevel degenerative disc disease    Multiple sclerosis (Rose Clements) 07/16/2015   Port-A-Cath in place    right side   Presence of intrathecal baclofen pump    right abdomen   Vertigo    Rare - none in last 6 months    Family History: Family History  Problem Relation Age of Onset   Diabetes Mother    Hypertension Mother    Heart disease Mother    Pulmonary embolism Mother    Diabetes Father    Diabetes Sister    Hypertension Sister    Heart disease Sister    Bipolar disorder Sister    Narcolepsy Brother    Heart attack Maternal Grandmother    Pancreatic cancer Maternal Grandfather    Breast cancer Maternal Aunt     Social History   Socioeconomic History   Marital status: Married    Spouse name: Rose Clements   Number of children: 1   Years of education: Not on file   Highest education level: Not on file  Occupational History   Occupation: Disability  Tobacco Use   Smoking status: Never   Smokeless tobacco: Never  Vaping Use   Vaping Use: Never used  Substance and Sexual Activity   Alcohol use: No    Alcohol/week: 0.0 standard drinks of alcohol    Comment: rare wine on Holidays   Drug use: No   Sexual activity: Not Currently  Other Topics Concern   Not on file  Social History Narrative   Lives with husband, Rose Clements   Right handed    Caffeine use: tea sometimes (mostly herbal)   Social Determinants of Health   Financial Resource Strain: Low Risk  (05/15/2021)   Overall Financial Resource Strain (CARDIA)    Difficulty of Paying Living Expenses: Not very hard  Food Insecurity: Not on file  Transportation  Needs: Not on file  Physical Activity: Not on file  Stress: Not on file  Social Connections: Not on file  Intimate Partner Violence: Not on file      Review of Systems  Constitutional:  Negative for chills, fatigue and unexpected weight change.  HENT:  Negative for congestion, rhinorrhea, sneezing and sore throat.   Eyes:  Negative for redness.  Respiratory:  Negative for cough, chest tightness and shortness of breath.   Cardiovascular:  Negative for chest pain and palpitations.  Gastrointestinal:  Negative for abdominal pain, constipation, diarrhea, nausea and vomiting.  Genitourinary:  Negative for dysuria and frequency.  Musculoskeletal:  Negative for arthralgias, back pain, joint swelling and neck pain.  Skin:  Negative  for rash.  Neurological: Negative.  Negative for tremors and numbness.  Hematological:  Negative for adenopathy. Does not bruise/bleed easily.  Psychiatric/Behavioral:  Positive for behavioral problems (Depression) and sleep disturbance. Negative for self-injury and suicidal ideas. The patient is nervous/anxious.     Vital Signs: BP 138/76   Pulse 74   Temp 98.7 F (37.1 C)   Resp 16   Ht 5\' 5"  (1.651 m)   Wt 204 lb 12.8 oz (92.9 kg)   SpO2 98%   BMI 34.08 kg/m    Physical Exam Vitals reviewed.  Constitutional:      General: She is not in acute distress.    Appearance: Normal appearance. She is obese. She is not ill-appearing.  Eyes:     Pupils: Pupils are equal, round, and reactive to light.  Cardiovascular:     Heart sounds: No murmur heard. Pulmonary:     Effort: Pulmonary effort is normal. No respiratory distress.  Musculoskeletal:     Cervical back: Neck supple. No tenderness.  Lymphadenopathy:     Cervical: No cervical adenopathy.  Neurological:     Mental Status: She is alert and oriented to person, place, and time.  Psychiatric:        Mood and Affect: Mood is depressed.        Behavior: Behavior normal.         Assessment/Plan: 1. Perimenopausal symptoms Venlafaxine dose increased which may be beneficial to perimenopausal symptoms as well.  - venlafaxine XR (EFFEXOR-XR) 150 MG 24 hr capsule; Take 1 capsule (150 mg total) by mouth daily with breakfast.  Dispense: 90 capsule; Refill: 1  2. Multiple sclerosis (HCC) Managed by neurology  3. Allergy, sequela Continue fluticasone nasal spray as prescribed.  - fluticasone (FLONASE) 50 MCG/ACT nasal spray; SPRAY 2 SPRAYS INTO EACH NOSTRIL EVERY DAY  Dispense: 48 mL; Refill: 2  4. Anxiety Continue alprazolam as prescribed. Refills ordered - ALPRAZolam (XANAX) 0.25 MG tablet; Take 1 tablet (0.25 mg total) by mouth 2 (two) times daily as needed (panic attacks).  Dispense: 30 tablet; Refill: 2  5. Major depressive disorder, recurrent episode, moderate (HCC) Venlafaxine dose increased to 150 mg daily. Will follow up in April at her upcoming visit, may be seen sooner if necessary per patient request.  - venlafaxine XR (EFFEXOR-XR) 150 MG 24 hr capsule; Take 1 capsule (150 mg total) by mouth daily with breakfast.  Dispense: 90 capsule; Refill: 1   General Counseling: Dondrea verbalizes understanding of the findings of todays visit and agrees with plan of treatment. I have discussed any further diagnostic evaluation that may be needed or ordered today. We also reviewed her medications today. she has been encouraged to call the office with any questions or concerns that should arise related to todays visit.    No orders of the defined types were placed in this encounter.   Meds ordered this encounter  Medications   venlafaxine XR (EFFEXOR-XR) 150 MG 24 hr capsule    Sig: Take 1 capsule (150 mg total) by mouth daily with breakfast.    Dispense:  90 capsule    Refill:  1   ALPRAZolam (XANAX) 0.25 MG tablet    Sig: Take 1 tablet (0.25 mg total) by mouth 2 (two) times daily as needed (panic attacks).    Dispense:  30 tablet    Refill:  2    fluticasone (FLONASE) 50 MCG/ACT nasal spray    Sig: SPRAY 2 SPRAYS INTO EACH NOSTRIL EVERY DAY  Dispense:  48 mL    Refill:  2    Return for previously scheduled, CPE, Dijon Cosens PCP, F/U, Anxiety/depression and increased dose then in april.   Total time spent:30 Minutes Time spent includes review of chart, medications, test results, and follow up plan with the patient.   [place] Controlled Substance Database was reviewed by me.  This patient was seen by Jonetta Osgood, FNP-C in collaboration with Dr. Clayborn Bigness as a part of collaborative care agreement.   Adylee Leonardo R. Valetta Fuller, MSN, FNP-C Internal medicine

## 2022-12-28 DIAGNOSIS — G35 Multiple sclerosis: Secondary | ICD-10-CM | POA: Diagnosis not present

## 2023-01-01 ENCOUNTER — Telehealth: Payer: Self-pay | Admitting: Neurology

## 2023-01-01 NOTE — Telephone Encounter (Signed)
[facility] infusion telemetry 12/14/2022 after refll (Baclofen 2000 mcg/ml) rate is 120.1 mcg/day)

## 2023-01-04 DIAGNOSIS — G35 Multiple sclerosis: Secondary | ICD-10-CM | POA: Diagnosis not present

## 2023-01-04 NOTE — Telephone Encounter (Signed)
error 

## 2023-01-05 ENCOUNTER — Encounter: Payer: Self-pay | Admitting: *Deleted

## 2023-01-05 ENCOUNTER — Emergency Department (EMERGENCY_DEPARTMENT_HOSPITAL)
Admission: EM | Admit: 2023-01-05 | Discharge: 2023-01-06 | Disposition: A | Discharge status: Other Acute Inpatient Hospital | Payer: Medicare Other | Source: Home / Self Care | Attending: Emergency Medicine | Admitting: Emergency Medicine

## 2023-01-05 ENCOUNTER — Other Ambulatory Visit: Payer: Self-pay

## 2023-01-05 DIAGNOSIS — N3941 Urge incontinence: Secondary | ICD-10-CM | POA: Insufficient documentation

## 2023-01-05 DIAGNOSIS — F331 Major depressive disorder, recurrent, moderate: Secondary | ICD-10-CM | POA: Insufficient documentation

## 2023-01-05 DIAGNOSIS — G43019 Migraine without aura, intractable, without status migrainosus: Secondary | ICD-10-CM | POA: Insufficient documentation

## 2023-01-05 DIAGNOSIS — R262 Difficulty in walking, not elsewhere classified: Secondary | ICD-10-CM | POA: Insufficient documentation

## 2023-01-05 DIAGNOSIS — R29898 Other symptoms and signs involving the musculoskeletal system: Secondary | ICD-10-CM | POA: Insufficient documentation

## 2023-01-05 DIAGNOSIS — K2100 Gastro-esophageal reflux disease with esophagitis, without bleeding: Secondary | ICD-10-CM | POA: Insufficient documentation

## 2023-01-05 DIAGNOSIS — G35 Multiple sclerosis: Secondary | ICD-10-CM | POA: Insufficient documentation

## 2023-01-05 DIAGNOSIS — G4700 Insomnia, unspecified: Secondary | ICD-10-CM | POA: Diagnosis present

## 2023-01-05 DIAGNOSIS — R001 Bradycardia, unspecified: Secondary | ICD-10-CM | POA: Diagnosis not present

## 2023-01-05 DIAGNOSIS — R45851 Suicidal ideations: Secondary | ICD-10-CM | POA: Insufficient documentation

## 2023-01-05 DIAGNOSIS — F419 Anxiety disorder, unspecified: Secondary | ICD-10-CM | POA: Insufficient documentation

## 2023-01-05 DIAGNOSIS — Z1152 Encounter for screening for COVID-19: Secondary | ICD-10-CM | POA: Insufficient documentation

## 2023-01-05 DIAGNOSIS — I10 Essential (primary) hypertension: Secondary | ICD-10-CM | POA: Insufficient documentation

## 2023-01-05 DIAGNOSIS — F411 Generalized anxiety disorder: Secondary | ICD-10-CM | POA: Diagnosis present

## 2023-01-05 LAB — CBC
HCT: 39.6 % (ref 36.0–46.0)
Hemoglobin: 12.3 g/dL (ref 12.0–15.0)
MCH: 29.5 pg (ref 26.0–34.0)
MCHC: 31.1 g/dL (ref 30.0–36.0)
MCV: 95 fL (ref 80.0–100.0)
Platelets: 305 10*3/uL (ref 150–400)
RBC: 4.17 MIL/uL (ref 3.87–5.11)
RDW: 12.5 % (ref 11.5–15.5)
WBC: 8.4 10*3/uL (ref 4.0–10.5)
nRBC: 0.4 % — ABNORMAL HIGH (ref 0.0–0.2)

## 2023-01-05 LAB — URINE DRUG SCREEN, QUALITATIVE (ARMC ONLY)
Amphetamines, Ur Screen: NOT DETECTED
Barbiturates, Ur Screen: NOT DETECTED
Benzodiazepine, Ur Scrn: NOT DETECTED
Cannabinoid 50 Ng, Ur ~~LOC~~: NOT DETECTED
Cocaine Metabolite,Ur ~~LOC~~: NOT DETECTED
MDMA (Ecstasy)Ur Screen: NOT DETECTED
Methadone Scn, Ur: NOT DETECTED
Opiate, Ur Screen: NOT DETECTED
Phencyclidine (PCP) Ur S: NOT DETECTED
Tricyclic, Ur Screen: NOT DETECTED

## 2023-01-05 LAB — COMPREHENSIVE METABOLIC PANEL
ALT: 9 U/L (ref 0–44)
AST: 18 U/L (ref 15–41)
Albumin: 4.2 g/dL (ref 3.5–5.0)
Alkaline Phosphatase: 75 U/L (ref 38–126)
Anion gap: 8 (ref 5–15)
BUN: 12 mg/dL (ref 6–20)
CO2: 24 mmol/L (ref 22–32)
Calcium: 9.2 mg/dL (ref 8.9–10.3)
Chloride: 108 mmol/L (ref 98–111)
Creatinine, Ser: 0.78 mg/dL (ref 0.44–1.00)
GFR, Estimated: >60 mL/min (ref >60)
Glucose, Bld: 108 mg/dL — ABNORMAL HIGH (ref 70–99)
Potassium: 3.9 mmol/L (ref 3.5–5.1)
Sodium: 140 mmol/L (ref 135–145)
Total Bilirubin: 0.5 mg/dL (ref 0.3–1.2)
Total Protein: 7.2 g/dL (ref 6.5–8.1)

## 2023-01-05 LAB — ETHANOL: Alcohol, Ethyl (B): <10 mg/dL (ref <10)

## 2023-01-05 LAB — RESP PANEL BY RT-PCR (RSV, FLU A&B, COVID)  RVPGX2
Influenza A by PCR: NEGATIVE
Influenza B by PCR: NEGATIVE
Resp Syncytial Virus by PCR: NEGATIVE
SARS Coronavirus 2 by RT PCR: NEGATIVE

## 2023-01-05 LAB — SALICYLATE LEVEL: Salicylate Lvl: <7 mg/dL — ABNORMAL LOW (ref 7.0–30.0)

## 2023-01-05 LAB — ACETAMINOPHEN LEVEL: Acetaminophen (Tylenol), Serum: <10 ug/mL — ABNORMAL LOW (ref 10–30)

## 2023-01-05 NOTE — BH Assessment (Signed)
Pt's spouse Channie, [place] [phone]) notified of pt's plan of care. Husband verbalized an understanding of the information discussed.

## 2023-01-05 NOTE — ED Notes (Signed)
Pt given dinner and water with no complaints.

## 2023-01-05 NOTE — ED Notes (Signed)
Nurse talked to Patient and she said that she was having lots of anxiety, and was very depressed, "I'm alone most of the time , my husband drives a truck and I use to go to bible study and church and now with my MS I haven't felt like going anywhere and that has made everything worse, she states " I want to get help, I don't want to die, I don't want to ever attempt suicide again, Staff will continue to monitor for safety.

## 2023-01-05 NOTE — ED Provider Notes (Signed)
[facility] Provider Note    Event Date/Time   First MD Initiated Contact with Patient 01/05/23 1651     (approximate)   History   Depression   HPI  Rose Clements is a 62 y.o. female with a history of hypertension, MS, GERD, and depression who presents with worsening depression over the last few weeks.  The patient states that she recently tried to kill herself by turning on the car in the garage and poison herself with the exhaust.  Patient states she has been compliant with her venlafaxine which was recently increased.  She denies any acute medical complaints.  I reviewed the past medical records.  The patient has no recent ED visits or admissions.  Her most recent outpatient encounter was with internal medicine on 1/16.  Her venlafaxine dose was increased at that time, although the patient states that she only started the higher dose few days ago.  She is also on alprazolam.   Physical Exam   Triage Vital Signs: ED Triage Vitals  Enc Vitals Group     BP 01/05/23 1538 (!) 155/92     Pulse Rate 01/05/23 1538 77     Resp 01/05/23 1538 20     Temp 01/05/23 1538 98.3 F (36.8 C)     Temp Source 01/05/23 1538 Oral     SpO2 01/05/23 1538 100 %     Weight 01/05/23 1534 204 lb (92.5 kg)     Height 01/05/23 1534 5\' 3"  (1.6 m)     Head Circumference --      Peak Flow --      Pain Score 01/05/23 1534 7     Pain Loc --      Pain Edu? --      Excl. in [place]? --     Most recent vital signs: Vitals:   01/05/23 1538 01/05/23 2011  BP: (!) 155/92 117/84  Pulse: 77 70  Resp: 20 17  Temp: 98.3 F (36.8 C) 98.3 F (36.8 C)  SpO2: 100% 98%     General: Awake, no distress.  CV:  Good peripheral perfusion.  Resp:  Normal effort.  Abd:  No distention.  Other:  Calm and cooperative.   ED Results / Procedures / Treatments   Labs (all labs ordered are listed, but only abnormal results are displayed) Labs Reviewed  COMPREHENSIVE METABOLIC PANEL -  Abnormal; Notable for the following components:      Result Value   Glucose, Bld 108 (*)    All other components within normal limits  SALICYLATE LEVEL - Abnormal; Notable for the following components:   Salicylate Lvl <9.1 (*)    All other components within normal limits  ACETAMINOPHEN LEVEL - Abnormal; Notable for the following components:   Acetaminophen (Tylenol), Serum <10 (*)    All other components within normal limits  CBC - Abnormal; Notable for the following components:   nRBC 0.4 (*)    All other components within normal limits  RESP PANEL BY RT-PCR (RSV, FLU A&B, COVID)  [id number]  ETHANOL  URINE DRUG SCREEN, QUALITATIVE ([facility] ONLY)     EKG   RADIOLOGY   PROCEDURES:  Critical Care performed: No  Procedures   MEDICATIONS ORDERED IN ED: Medications - No data to display   IMPRESSION / MDM / [place] / ED COURSE  I reviewed the triage vital signs and the nursing notes.  62 year old female with PMH as noted above presents with worsening depression and suicidal  ideation.  The patient states that she tried to harm herself several days ago with carbon monoxide.  However currently she is calm and cooperative, presented here voluntarily, and is able to contract for safety.  There is no indication for involuntary commitment.  She denies any acute medical issues.  Screening labs are unremarkable.  UDS is negative.  Ethanol is negative.  Electrolytes are normal.  Differential diagnosis includes, but is not limited to, major depressive disorder, adjustment disorder, substance-induced mood disorder.  I have ordered psychiatry and TTS consult for further evaluation.  Patient's presentation is most consistent with acute presentation with potential threat to life or bodily function.  The patient has been placed in psychiatric observation due to the need to provide a safe environment for the patient while obtaining psychiatric consultation and evaluation, as well as  ongoing medical and medication management to treat the patient's condition.  The patient has not been placed under full IVC at this time.   ----------------------------------------- 11:31 PM on 01/05/2023 -----------------------------------------  I consulted and discussed the case with NP Grandville Silos from psychiatry who evaluated the patient.  She recommends inpatient psychiatric admission.   FINAL CLINICAL IMPRESSION(S) / ED DIAGNOSES   Final diagnoses:  Suicidal ideation     Rx / DC Orders   ED Discharge Orders     None        Note:  This document was prepared using Dragon voice recognition software and may include unintentional dictation errors.    Arta Silence, MD 01/05/23 [phone]

## 2023-01-05 NOTE — ED Notes (Signed)
Black shoes Camouflage  pants Black jacket  Gray/black shirt Pink underwear Keys for car Phone [employer] bra

## 2023-01-05 NOTE — BH Assessment (Signed)
Comprehensive Clinical Assessment (CCA) Note  01/05/2023 Rose Clements [id number] Recommendations for Services/Supports/Treatments: Psych NP Lynder Parents. determined pt. meets psychiatric inpatient criteria. Notified Dr. Cherylann Banas and Maudie Mercury, RN of disposition recommendation. Facilities will be contacted for placement.    Rose Clements is a 62 year old, English speaking, Black female with a hx of MDD without psychotic features. Pt presented to [facility] ED voluntarily due to concerns about worsening symptoms of depression and thoughts of SI. Upon assessment, pt. endorsed feeling depressed, isolated, and withdrawn. Pt reported that she is married; however, her husband is a Programmer, systems. Pt reported that this depressive episode has been getting progressively worse as last week she'd sat in the garage with her car started and the garage closed. Pt reported that she has a diagnosis of multiple sclerosis. Pt explained that this scared her into seeking help. The pt. had good insight and judgement. Pt did not appear to be responding to internal or external stimuli. Pt presented with normal speech and had cogent thought processes. Pt made good eye contact and was cooperative with the assessment. Pt presented with a depressed mood; affect was congruent. Pt denied current SI/HI/AV/H.  Chief Complaint:  Chief Complaint  Patient presents with   Depression   Visit Diagnosis: MDD without psychosis    CCA Screening, Triage and Referral (STR)  Patient Reported Information How did you hear about [place]? Self  Referral name: No data recorded Referral phone number: No data recorded  Whom do you see for routine medical problems? No data recorded Practice/Facility Name: No data recorded Practice/Facility Phone Number: No data recorded Name of Contact: No data recorded Contact Number: No data recorded Contact Fax Number: No data recorded Prescriber Name: No data recorded Prescriber Address (if known): No data  recorded  What Is the Reason for Your Visit/Call Today? Pt brought pt in from doctor's office.  Pt reports she is depressed for several months. Pt is VOL  How Long Has This Been Causing You Problems? > than 6 months  What Do You Feel Would Help You the Most Today? Treatment for Depression or other mood problem   Have You Recently Been in Any Inpatient Treatment (Hospital/Detox/Crisis Center/28-Day Program)? No data recorded Name/Location of Program/Hospital:No data recorded How Long Were You There? No data recorded When Were You Discharged? No data recorded  Have You Ever Received Services From [facility] Before? No data recorded Who Do You See at [facility]? No data recorded  Have You Recently Had Any Thoughts About Hurting Yourself? Yes  Are You Planning to Commit Suicide/Harm Yourself At This time? No   Have you Recently Had Thoughts About [place]? No  Explanation: No data recorded  Have You Used Any Alcohol or Drugs in the Past 24 Hours? No  How Long Ago Did You Use Drugs or Alcohol? No data recorded What Did You Use and How Much? n/a   Do You Currently Have a Therapist/Psychiatrist? No  Name of Therapist/Psychiatrist: n/a   Have You Been Recently Discharged From Any Office Practice or Programs? No  Explanation of Discharge From Practice/Program: n/a     CCA Screening Triage Referral Assessment Type of Contact: Face-to-Face  Is this Initial or Reassessment? No data recorded Date Telepsych consult ordered in [facility]:  No data recorded Time Telepsych consult ordered in [facility]:  No data recorded  Patient Reported Information Reviewed? No data recorded Patient Left Without Being Seen? No data recorded Reason for Not Completing Assessment: No data recorded  Collateral Involvement:  None provided   Does Patient Have a Stage manager Guardian? No data recorded Name and Contact of Legal Guardian: No data recorded If Minor and Not Living with  Parent(s), Who has Custody? n/a  Is CPS involved or ever been involved? Never  Is APS involved or ever been involved? Never   Patient Determined To Be At Risk for Harm To Self or Others Based on Review of Patient Reported Information or Presenting Complaint? Yes, for Self-Harm  Method: No Plan  Availability of Means: No access or NA  Intent: Vague intent or NA  Notification Required: No need or identified person  Additional Information for Danger to Others Potential: -- (n/a)  Additional Comments for Danger to Others Potential: n/a  Are There Guns or Other Weapons in Your Home? No  Types of Guns/Weapons: na  Are These Weapons Safely Secured?                            No data recorded Who Could Verify You Are Able To Have These Secured: No data recorded Do You Have any Outstanding Charges, Pending Court Dates, Parole/Probation? No data recorded Contacted To Inform of Risk of Harm To Self or Others: Other: Comment   Location of Assessment: [facility] ED   Does Patient Present under Involuntary Commitment? No  IVC Papers Initial File Date: No data recorded  [place] of Residence: [place]   Patient Currently Receiving the Following Services: Medication Management   Determination of Need: Emergent (2 hours)   Options For Referral: Inpatient Hospitalization     CCA Biopsychosocial Intake/Chief Complaint:  No data recorded Current Symptoms/Problems: No data recorded  Patient Reported Schizophrenia/Schizoaffective Diagnosis in Past: No   Strengths: Pt is receptive to treatment; pt has good insight; pt has stable housing/supportive family  Preferences: No data recorded Abilities: No data recorded  Type of Services Patient Feels are Needed: No data recorded  Initial Clinical Notes/Concerns: No data recorded  Mental Health Symptoms Depression:   Hopelessness; Worthlessness   Duration of Depressive symptoms:  Greater than two weeks   Mania:   None    Anxiety:    N/A   Psychosis:   None   Duration of Psychotic symptoms: No data recorded  Trauma:   None   Obsessions:   None   Compulsions:   None   Inattention:   None   Hyperactivity/Impulsivity:   None   Oppositional/Defiant Behaviors:   None   Emotional Irregularity:   N/A   Other Mood/Personality Symptoms:  No data recorded   Mental Status Exam Appearance and self-care  Stature:   Average   Weight:   Average weight   Clothing:   Casual   Grooming:   Well-groomed   Cosmetic use:   None   Posture/gait:   Normal   Motor activity:   Not Remarkable   Sensorium  Attention:   Normal   Concentration:   Normal   Orientation:   Situation; Place; Person; Object   Recall/memory:   Normal   Affect and Mood  Affect:   Depressed   Mood:   Depressed   Relating  Eye contact:   Normal   Facial expression:   Sad   Attitude toward examiner:   Cooperative   Thought and Language  Speech flow:  Clear and Coherent   Thought content:   Appropriate to Mood and Circumstances   Preoccupation:   None   Hallucinations:   None  Organization:  No data recorded  [employer] of Knowledge:   Average   Intelligence:   Average   Abstraction:   Normal   Judgement:   Good   Reality Testing:   Adequate   Insight:   Good   Decision Making:   Normal   Social Functioning  Social Maturity:   Responsible   Social Judgement:   Normal   Stress  Stressors:   Illness   Coping Ability:   Overwhelmed; Deficient supports   Skill Deficits:   None   Supports:   Family; Support needed     Religion: Religion/Spirituality Are You A Religious Person?:  (n/a)  Leisure/Recreation: Leisure / Recreation Do You Have Hobbies?: No  Exercise/Diet: Exercise/Diet Do You Exercise?: No Have You Gained or Lost A Significant Amount of Weight in the Past Six Months?: No Do You Follow a Special Diet?: No Do You Have  Any Trouble Sleeping?: No   CCA Employment/Education Employment/Work Situation: Employment / Work Technical sales engineer: On disability Why is Patient on Disability: Pt has diagnosis of MS Patient's Job has Been Impacted by Current Illness: No Has Patient ever Been in the [employer]?: No  Education: Education Is Patient Currently Attending School?: No Did You Have An Individualized Education Program (IIEP): No Did You Have Any Difficulty At [employer]?: No Patient's Education Has Been Impacted by Current Illness: No   CCA Family/Childhood History Family and Relationship History: Family history Marital status: Married What types of issues is patient dealing with in the relationship?: None Additional relationship information: Pt's husband is a long distance Administrator.  Childhood History:  Childhood History Did patient suffer any verbal/emotional/physical/sexual abuse as a child?: No Did patient suffer from severe childhood neglect?: No Has patient ever been sexually abused/assaulted/raped as an adolescent or adult?: No Was the patient ever a victim of a crime or a disaster?: No Witnessed domestic violence?: No Has patient been affected by domestic violence as an adult?: No  Child/Adolescent Assessment:     CCA Substance Use Alcohol/Drug Use: Alcohol / Drug Use Pain Medications: See MAR Prescriptions: See MAR Over the Counter: See MAR History of alcohol / drug use?: No history of alcohol / drug abuse                         ASAM's:  Six Dimensions of Multidimensional Assessment  Dimension 1:  Acute Intoxication and/or Withdrawal Potential:      Dimension 2:  Biomedical Conditions and Complications:      Dimension 3:  Emotional, Behavioral, or Cognitive Conditions and Complications:     Dimension 4:  Readiness to Change:     Dimension 5:  Relapse, Continued use, or Continued Problem Potential:     Dimension 6:  Recovery/Living Environment:      ASAM Severity Score:    ASAM Recommended Level of Treatment:     Substance use Disorder (SUD)    Recommendations for Services/Supports/Treatments:    DSM5 Diagnoses: Patient Active Problem List   Diagnosis Date Noted   Presence of intrathecal pump 02/12/2022   Multiple thyroid nodules 11/23/2020   Migraine without aura and without status migrainosus, not intractable 11/11/2020   Special screening for malignant neoplasm of intestine    High risk medication use 05/23/2020   Leg weakness 11/21/2019   Spasticity 06/01/2017   Problems with swallowing and mastication    Stricture and stenosis of esophagus    Urge incontinence 10/17/2015   Urgency  of micturation 10/17/2015   Anxiety state 08/16/2015   Multiple sclerosis (HCC) 07/16/2015   Major depressive disorder, recurrent episode, moderate (HCC) 07/01/2015   Gastroesophageal reflux disease with esophagitis 06/14/2015   DS (disseminated sclerosis) (HCC) 05/24/2015   BP (high blood pressure) 05/24/2015   Acid reflux 05/24/2015   Esophageal dysphagia 05/24/2015   CN (constipation) 05/24/2015   Bladder infection, chronic 05/24/2015   Narrowing of intervertebral disc space 05/24/2015   Clinical depression 05/24/2015   Other fatigue 12/24/2014   Difficulty in walking 12/24/2014   Can't get food down 12/24/2014   Insomnia 12/24/2014   Amnesia 12/24/2014   Difficulty in walking, not elsewhere classified 12/24/2014   Eosinophilic esophagitis 11/19/2014   Mariel Gaudin R Joury Allcorn, LCAS

## 2023-01-05 NOTE — ED Notes (Signed)
VOL  PENDING  PLACEMENT 

## 2023-01-05 NOTE — ED Notes (Signed)
Pt given nighttime snack. 

## 2023-01-05 NOTE — ED Triage Notes (Addendum)
Pt brought pt in from doctor's office.  Pt reports she is depressed for several months. Pt is VOL.   Pt also reports SI.  Pt reports sitting in the car in her garage and closing the doors and then changed her mind.  Pt denies etoh or drug use.  Pt calm and cooperative.  Pt ambulates with a cane.  Hx MS

## 2023-01-05 NOTE — ED Notes (Signed)
Patient transferred to room 5 from the [facility], Nurse received report.

## 2023-01-06 ENCOUNTER — Encounter: Payer: Self-pay | Admitting: Psychiatry

## 2023-01-06 ENCOUNTER — Inpatient Hospital Stay
Admission: AD | Admit: 2023-01-06 | Discharge: 2023-01-10 | DRG: 885 | Disposition: A | Discharge status: Home / Self Care | Payer: Medicare Other | Source: Intra-hospital | Attending: Psychiatry | Admitting: Psychiatry

## 2023-01-06 ENCOUNTER — Other Ambulatory Visit: Payer: Self-pay

## 2023-01-06 DIAGNOSIS — Z9151 Personal history of suicidal behavior: Secondary | ICD-10-CM

## 2023-01-06 DIAGNOSIS — F332 Major depressive disorder, recurrent severe without psychotic features: Secondary | ICD-10-CM | POA: Diagnosis not present

## 2023-01-06 DIAGNOSIS — Z833 Family history of diabetes mellitus: Secondary | ICD-10-CM

## 2023-01-06 DIAGNOSIS — Z1152 Encounter for screening for COVID-19: Secondary | ICD-10-CM | POA: Diagnosis not present

## 2023-01-06 DIAGNOSIS — Z800 Family history of malignant neoplasm of digestive organs: Secondary | ICD-10-CM | POA: Diagnosis not present

## 2023-01-06 DIAGNOSIS — K2100 Gastro-esophageal reflux disease with esophagitis, without bleeding: Secondary | ICD-10-CM | POA: Diagnosis present

## 2023-01-06 DIAGNOSIS — Z90710 Acquired absence of both cervix and uterus: Secondary | ICD-10-CM | POA: Diagnosis not present

## 2023-01-06 DIAGNOSIS — G4700 Insomnia, unspecified: Secondary | ICD-10-CM | POA: Diagnosis present

## 2023-01-06 DIAGNOSIS — Z8249 Family history of ischemic heart disease and other diseases of the circulatory system: Secondary | ICD-10-CM

## 2023-01-06 DIAGNOSIS — Z803 Family history of malignant neoplasm of breast: Secondary | ICD-10-CM | POA: Diagnosis not present

## 2023-01-06 DIAGNOSIS — N3941 Urge incontinence: Secondary | ICD-10-CM | POA: Diagnosis present

## 2023-01-06 DIAGNOSIS — I10 Essential (primary) hypertension: Secondary | ICD-10-CM | POA: Diagnosis present

## 2023-01-06 DIAGNOSIS — E559 Vitamin D deficiency, unspecified: Secondary | ICD-10-CM | POA: Diagnosis present

## 2023-01-06 DIAGNOSIS — G35 Multiple sclerosis: Secondary | ICD-10-CM | POA: Diagnosis present

## 2023-01-06 DIAGNOSIS — F331 Major depressive disorder, recurrent, moderate: Principal | ICD-10-CM | POA: Diagnosis present

## 2023-01-06 DIAGNOSIS — F329 Major depressive disorder, single episode, unspecified: Principal | ICD-10-CM | POA: Diagnosis present

## 2023-01-06 DIAGNOSIS — Z818 Family history of other mental and behavioral disorders: Secondary | ICD-10-CM

## 2023-01-06 DIAGNOSIS — F411 Generalized anxiety disorder: Secondary | ICD-10-CM | POA: Diagnosis present

## 2023-01-06 DIAGNOSIS — R45851 Suicidal ideations: Secondary | ICD-10-CM | POA: Diagnosis present

## 2023-01-06 DIAGNOSIS — K219 Gastro-esophageal reflux disease without esophagitis: Secondary | ICD-10-CM

## 2023-01-06 MED ORDER — ALUM & MAG HYDROXIDE-SIMETH 200-200-20 MG/5ML PO SUSP: 30.0000 mL | ORAL | Status: DC | PRN

## 2023-01-06 MED ORDER — MAGNESIUM HYDROXIDE 400 MG/5ML PO SUSP: 30.0000 mL | Freq: Every day | ORAL | Status: DC | PRN

## 2023-01-06 MED ORDER — HYDROXYZINE HCL 25 MG PO TABS: 25.0000 mg | ORAL_TABLET | Freq: Three times a day (TID) | ORAL | Status: DC | PRN

## 2023-01-06 MED ORDER — ACETAMINOPHEN 325 MG PO TABS
650.0000 mg | ORAL_TABLET | Freq: Four times a day (QID) | ORAL | Status: DC | PRN
  Administered 2023-01-07 – 2023-01-09 (×3): 650 mg via ORAL
  Filled 2023-01-06 (×3): qty 2

## 2023-01-06 NOTE — Tx Team (Signed)
Initial Treatment Plan 01/06/2023 6:34 PM Rose Clements [id number]    PATIENT STRESSORS: Health problems   Marital or family conflict   Occupational concerns     PATIENT STRENGTHS: Ability for insight  Marketing executive fund of knowledge  Motivation for treatment/growth  Religious Affiliation  Supportive family/friends    PATIENT IDENTIFIED PROBLEMS: SI with a plan, prior to admission  Anxiety/panic attacks  Depression                 DISCHARGE CRITERIA:  Improved stabilization in mood, thinking, and/or behavior Medical problems require only outpatient monitoring Need for constant or close observation no longer present Reduction of life-threatening or endangering symptoms to within safe limits  PRELIMINARY DISCHARGE PLAN: Outpatient therapy Return to previous living arrangement  PATIENT/FAMILY INVOLVEMENT: This treatment plan has been presented to and reviewed with the patient, Rose Clements.  The patient has been given the opportunity to ask questions and make suggestions.  Intisar Claudio, RN 01/06/2023, 6:34 PM

## 2023-01-06 NOTE — ED Provider Notes (Addendum)
Emergency Medicine Observation Re-evaluation Note  Rose Clements is a 62 y.o. female, seen on rounds today.  Pt initially presented to the ED for complaints of Depression  Currently, the patient is is no acute distress. Denies any concerns at this time.  Pleasant resting, asking for another blanket.  Physical Exam  Blood pressure 117/84, pulse 70, temperature 98.3 F (36.8 C), resp. rate 17, height 5\' 3"  (1.6 m), weight 92.5 kg, SpO2 98 %.  Physical Exam: General: No apparent distress Pulm: Normal WOB Neuro: Moving all extremities Psych: Resting comfortably     ED Course / MDM     I have reviewed the labs performed to date as well as medications administered while in observation.  Recent changes in the last 24 hours include: No acute events overnight.  Plan   Current plan: Patient awaiting psychiatric inpatient admission Patient is not under full IVC at this time.    Nathaniel Man, MD 01/06/23 0730    Nathaniel Man, MD 01/06/23 [phone]

## 2023-01-06 NOTE — ED Notes (Signed)
Pt given peaches, the snack brought up by dietary this morning.

## 2023-01-06 NOTE — ED Notes (Signed)
Pt given phone to call back her daughter. Tolerated well.

## 2023-01-06 NOTE — BH Assessment (Signed)
Signed consents faxed to [facility].
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
## 2023-01-06 NOTE — Consult Note (Signed)
[facility] Face-to-Face Psychiatry Consult   Reason for Consult: Depression   Referring Physician: Dr. Marisa Severin Patient Identification: Rose Clements MRN:  [id number] Principal Diagnosis: <principal problem not specified> Diagnosis:  Active Problems:   DS (disseminated sclerosis) (HCC)   BP (high blood pressure)   Clinical depression   Gastroesophageal reflux disease with esophagitis   Major depressive disorder, recurrent episode, moderate (HCC)   Insomnia   Multiple sclerosis (HCC)   Anxiety state   Urge incontinence   Difficulty in walking, not elsewhere classified   Leg weakness   Migraine without aura and without status migrainosus, not intractable   Total Time spent with patient: 1 hour  Subjective: " I sat in my car in my garage. It crossed my mind to run my with the garage door closed."  Rose Clements is a 62 y.o. female patient presented to [facility] ED via POV from her doctor's office. The patient stated she shared with her PCP what her thoughts were about ending her life. The patient shared that 2011 she was diagnosed with muscular sclerosis in 201. The patient states her husband is a Naval architect and is away from home most of the time. The patient shared that she is alone, which makes her sad. The patient shared that she was sitting in her car in the garage and had thought of shutting her garage door and letting her car run. The patient stated that during her visit to the ED tonight, she was in search of a psychiatric provider to prescribe her medications instead of her PCP.   This provider saw The patient face-to-face; the chart was reviewed, and Dr. Marisa Severin was consulted on 01/05/2023 due to the patient's care. It was discussed with the EDP that the patient does meet the criteria to be admitted to the psychiatric inpatient unit.  Upon evaluation, the patient is alert and oriented x 4, calm, cooperative, and mood-congruent with affect. The patient does not appear to be responding to  internal or external stimuli. Neither is the patient presenting with any delusional thinking. The patient denies auditory or visual hallucinations. The patient admits to suicidal ideation with a plan but denies homicidal or self-harm ideations. The patient is not presenting with any psychotic or paranoid behaviors. During an encounter with the patient, she could answer questions appropriately.  HPI: Per Dr. Marisa Severin, Rose Clements is a 62 y.o. female with a history of hypertension, MS, GERD, and depression who presents with worsening depression over the last few weeks.  The patient states that she recently tried to kill herself by turning on the car in the garage and poison herself with the exhaust.  Patient states she has been compliant with her venlafaxine which was recently increased.  She denies any acute medical complaints.   I reviewed the past medical records.  The patient has no recent ED visits or admissions.  Her most recent outpatient encounter was with internal medicine on 1/16.  Her venlafaxine dose was increased at that time, although the patient states that she only started the higher dose few days ago.  She is also on alprazolam.  Past Psychiatric History: Depression  Risk to Self:   Risk to Others:   Prior Inpatient Therapy:   Prior Outpatient Therapy:    Past Medical History:  Past Medical History:  Diagnosis Date   Constipation    Cystitis bacillary, chronic    Depression    Dysphagia    Dx by Beverely Risen on 10/15/14   Eosinophilic esophagitis 11/19/2014  GE junction benign stricture s/p balloon dilation, retained food contents. ?gastroparesis versus secondary to EE.   Esophageal dysphagia    GERD (gastroesophageal reflux disease)    Heartburn    Hypertension    Migraine headache    "couple times each month"   Multilevel degenerative disc disease    Multiple sclerosis ([place]) 07/16/2015   Port-A-Cath in place    right side   Presence of intrathecal baclofen pump     right abdomen   Vertigo    Rare - none in last 6 months    Past Surgical History:  Procedure Laterality Date   ABDOMINAL HYSTERECTOMY  2003   baclofen pump insertion Right 01/04/2018   CARPAL TUNNEL RELEASE Right 1997   CESAREAN SECTION  1989   COLONOSCOPY WITH PROPOFOL N/A 10/28/2020   Procedure: COLONOSCOPY WITH PROPOFOL;  Surgeon: Lucilla Lame, MD;  Location: ;  Service: Endoscopy;  Laterality: N/A;  PRIORITY 4 port a cath   CYSTECTOMY  2002   From chest   ESOPHAGOGASTRODUODENOSCOPY (EGD) WITH PROPOFOL N/A 05/19/2016   Procedure: ESOPHAGOGASTRODUODENOSCOPY (EGD) WITH PROPOFOL;  Surgeon: Lucilla Lame, MD;  Location: [facility] ENDOSCOPY;  Service: Endoscopy;  Laterality: N/A;   IR IMAGING GUIDED PORT INSERTION  06/24/2020   KIDNEY DONATION Left 2002   OVARIAN CYST REMOVAL Left 2002   Ovarian mass removal   TONSILLECTOMY  1973   UPPER GASTROINTESTINAL ENDOSCOPY  11/19/2014   Benign appearing esophageal stricture-Dilated.   Family History:  Family History  Problem Relation Age of Onset   Diabetes Mother    Hypertension Mother    Heart disease Mother    Pulmonary embolism Mother    Diabetes Father    Diabetes Sister    Hypertension Sister    Heart disease Sister    Bipolar disorder Sister    Narcolepsy Brother    Heart attack Maternal Grandmother    Pancreatic cancer Maternal Grandfather    Breast cancer Maternal Aunt    Family Psychiatric  History: History reviewed. No pertinent past psychiatric history Social History:  Social History   Substance and Sexual Activity  Alcohol Use No   Alcohol/week: 0.0 standard drinks of alcohol   Comment: rare wine on Holidays     Social History   Substance and Sexual Activity  Drug Use No    Social History   Socioeconomic History   Marital status: Married    Spouse name: [name]   Number of children: 1   Years of education: Not on file   Highest education level: Not on file  Occupational History   Occupation:  Disability  Tobacco Use   Smoking status: Never   Smokeless tobacco: Never  Vaping Use   Vaping Use: Never used  Substance and Sexual Activity   Alcohol use: No    Alcohol/week: 0.0 standard drinks of alcohol    Comment: rare wine on Holidays   Drug use: No   Sexual activity: Not Currently  Other Topics Concern   Not on file  Social History Narrative   Lives with husband, Magdalena Skilton   Right handed    Caffeine use: tea sometimes (mostly herbal)   Social Determinants of Health   Financial Resource Strain: Low Risk  (05/15/2021)   Overall Financial Resource Strain (CARDIA)    Difficulty of Paying Living Expenses: Not very hard  Food Insecurity: Not on file  Transportation Needs: Not on file  Physical Activity: Not on file  Stress: Not on file  Social Connections: Not  on file   Additional Social History:    Allergies:   Allergies  Allergen Reactions   Lisinopril Hives    Labs:  Results for orders placed or performed during the hospital encounter of 01/05/23 (from the past 48 hour(s))  Comprehensive metabolic panel     Status: Abnormal   Collection Time: 01/05/23  3:47 PM  Result Value Ref Range   Sodium 140 135 - 145 mmol/L   Potassium 3.9 3.5 - 5.1 mmol/L   Chloride 108 98 - 111 mmol/L   CO2 24 22 - 32 mmol/L   Glucose, Bld 108 (H) 70 - 99 mg/dL    Comment: Glucose reference range applies only to samples taken after fasting for at least 8 hours.   BUN 12 6 - 20 mg/dL   Creatinine, Ser 0.78 0.44 - 1.00 mg/dL   Calcium 9.2 8.9 - 10.3 mg/dL   Total Protein 7.2 6.5 - 8.1 g/dL   Albumin 4.2 3.5 - 5.0 g/dL   AST 18 15 - 41 U/L   ALT 9 0 - 44 U/L   Alkaline Phosphatase 75 38 - 126 U/L   Total Bilirubin 0.5 0.3 - 1.2 mg/dL   GFR, Estimated >60 >60 mL/min    Comment: (NOTE) Calculated using the CKD-EPI Creatinine Equation (2021)    Anion gap 8 5 - 15    Comment: Performed at [facility], [address] [place], [place] [address]  Ethanol     Status:  None   Collection Time: 01/05/23  3:47 PM  Result Value Ref Range   Alcohol, Ethyl (B) <10 <10 mg/dL    Comment: (NOTE) Lowest detectable limit for serum alcohol is 10 mg/dL.  For medical purposes only. Performed at [facility], [address] [place], [place] [address]   Salicylate level     Status: Abnormal   Collection Time: 01/05/23  3:47 PM  Result Value Ref Range   Salicylate Lvl <9.8 (L) 7.0 - 30.0 mg/dL    Comment: Performed at [facility], [address] [place], [place] [address]  Acetaminophen level     Status: Abnormal   Collection Time: 01/05/23  3:47 PM  Result Value Ref Range   Acetaminophen (Tylenol), Serum <10 (L) 10 - 30 ug/mL    Comment: (NOTE) Therapeutic concentrations vary significantly. A range of 10-30 ug/mL  may be an effective concentration for many patients. However, some  are best treated at concentrations outside of this range. Acetaminophen concentrations >150 ug/mL at 4 hours after ingestion  and >50 ug/mL at 12 hours after ingestion are often associated with  toxic reactions.  Performed at [facility], [address] [place], [place] [address]   cbc     Status: Abnormal   Collection Time: 01/05/23  3:47 PM  Result Value Ref Range   WBC 8.4 4.0 - 10.5 K/uL   RBC 4.17 3.87 - 5.11 MIL/uL   Hemoglobin 12.3 12.0 - 15.0 g/dL   HCT 39.6 36.0 - 46.0 %   MCV 95.0 80.0 - 100.0 fL   MCH 29.5 26.0 - 34.0 pg   MCHC 31.1 30.0 - 36.0 g/dL   RDW 12.5 11.5 - 15.5 %   Platelets 305 150 - 400 K/uL   nRBC 0.4 (H) 0.0 - 0.2 %    Comment: Performed at [facility], [address] [place],  [address]  Urine Drug Screen, Qualitative     Status: None   Collection Time: 01/05/23  3:47  PM  Result Value Ref Range   Tricyclic, Ur Screen NONE DETECTED NONE DETECTED   Amphetamines, Ur Screen NONE DETECTED NONE DETECTED   MDMA (Ecstasy)Ur Screen NONE DETECTED NONE DETECTED   Cocaine Metabolite,Ur [place] NONE DETECTED  NONE DETECTED   Opiate, Ur Screen NONE DETECTED NONE DETECTED   Phencyclidine (PCP) Ur S NONE DETECTED NONE DETECTED   Cannabinoid 50 Ng, Ur [place] NONE DETECTED NONE DETECTED   Barbiturates, Ur Screen NONE DETECTED NONE DETECTED   Benzodiazepine, Ur Scrn NONE DETECTED NONE DETECTED   Methadone Scn, Ur NONE DETECTED NONE DETECTED    Comment: (NOTE) Tricyclics + metabolites, urine    Cutoff 1000 ng/mL Amphetamines + metabolites, urine  Cutoff 1000 ng/mL MDMA (Ecstasy), urine              Cutoff 500 ng/mL Cocaine Metabolite, urine          Cutoff 300 ng/mL Opiate + metabolites, urine        Cutoff 300 ng/mL Phencyclidine (PCP), urine         Cutoff 25 ng/mL Cannabinoid, urine                 Cutoff 50 ng/mL Barbiturates + metabolites, urine  Cutoff 200 ng/mL Benzodiazepine, urine              Cutoff 200 ng/mL Methadone, urine                   Cutoff 300 ng/mL  The urine drug screen provides only a preliminary, unconfirmed analytical test result and should not be used for non-medical purposes. Clinical consideration and professional judgment should be applied to any positive drug screen result due to possible interfering substances. A more specific alternate chemical method must be used in order to obtain a confirmed analytical result. Gas chromatography / mass spectrometry (GC/MS) is the preferred confirm atory method. Performed at [facility], [address] [place], [place] [address]   Resp panel by RT-PCR (RSV, Flu A&B, Covid) Anterior Nasal Swab     Status: None   Collection Time: 01/05/23  7:47 PM   Specimen: Anterior Nasal Swab  Result Value Ref Range   SARS Coronavirus 2 by RT PCR NEGATIVE NEGATIVE    Comment: (NOTE) SARS-CoV-2 target nucleic acids are NOT DETECTED.  The SARS-CoV-2 RNA is generally detectable in upper respiratory specimens during the acute phase of infection. The lowest concentration of SARS-CoV-2 viral copies this assay can detect is 138  copies/mL. A negative result does not preclude SARS-Cov-2 infection and should not be used as the sole basis for treatment or other patient management decisions. A negative result may occur with  improper specimen collection/handling, submission of specimen other than nasopharyngeal swab, presence of viral mutation(s) within the areas targeted by this assay, and inadequate number of viral copies(<138 copies/mL). A negative result must be combined with clinical observations, patient history, and epidemiological information. The expected result is Negative.  Fact Sheet for Patients:  EntrepreneurPulse.com.au  Fact Sheet for Healthcare Providers:  IncredibleEmployment.be  This test is no t yet approved or cleared by the [place] FDA and  has been authorized for detection and/or diagnosis of SARS-CoV-2 by FDA under an Emergency Use Authorization (EUA). This EUA will remain  in effect (meaning this test can be used) for the duration of the COVID-19 declaration under Section 564(b)(1) of the Act, 21 U.S.C.section 360bbb-3(b)(1), unless the authorization is terminated  or revoked sooner.       Influenza  A by PCR NEGATIVE NEGATIVE   Influenza B by PCR NEGATIVE NEGATIVE    Comment: (NOTE) The Xpert Xpress SARS-CoV-2/FLU/RSV plus assay is intended as an aid in the diagnosis of influenza from Nasopharyngeal swab specimens and should not be used as a sole basis for treatment. Nasal washings and aspirates are unacceptable for Xpert Xpress SARS-CoV-2/FLU/RSV testing.  Fact Sheet for Patients: EntrepreneurPulse.com.au  Fact Sheet for Healthcare Providers: IncredibleEmployment.be  This test is not yet approved or cleared by the [place] FDA and has been authorized for detection and/or diagnosis of SARS-CoV-2 by FDA under an Emergency Use Authorization (EUA). This EUA will remain in effect (meaning this test  can be used) for the duration of the COVID-19 declaration under Section 564(b)(1) of the Act, 21 U.S.C. section 360bbb-3(b)(1), unless the authorization is terminated or revoked.     Resp Syncytial Virus by PCR NEGATIVE NEGATIVE    Comment: (NOTE) Fact Sheet for Patients: EntrepreneurPulse.com.au  Fact Sheet for Healthcare Providers: IncredibleEmployment.be  This test is not yet approved or cleared by the [place] FDA and has been authorized for detection and/or diagnosis of SARS-CoV-2 by FDA under an Emergency Use Authorization (EUA). This EUA will remain in effect (meaning this test can be used) for the duration of the COVID-19 declaration under Section 564(b)(1) of the Act, 21 U.S.C. section 360bbb-3(b)(1), unless the authorization is terminated or revoked.  Performed at [facility], [address] [place], [place] [address]     No current facility-administered medications for this encounter.   Current Outpatient Medications  Medication Sig Dispense Refill   acetaminophen (TYLENOL) 500 MG tablet Take 500 mg by mouth every 6 (six) hours as needed (for pain.).     ALPRAZolam (XANAX) 0.25 MG tablet Take 1 tablet (0.25 mg total) by mouth 2 (two) times daily as needed (panic attacks). 30 tablet 2   baclofen (LIORESAL) 10 MG tablet Take 1 tablet (10 mg total) by mouth See admin instructions. Take 1 tablet (10 mg) by mouth scheduled in the morning & evening, may take an additional dose at bedtime if needed for spasms. 270 each 4   cetirizine (ZYRTEC) 10 MG tablet Take 10 mg by mouth daily.     chlorpheniramine-HYDROcodone (TUSSIONEX PENNKINETIC ER) 10-8 MG/5ML Take 5 mLs by mouth every 12 (twelve) hours as needed for cough. 140 mL 0   Cholecalciferol (VITAMIN D3) 1000 UNITS CAPS Take 1,000 Units by mouth in the morning and at bedtime.      desmopressin (DDAVP) 0.1 MG tablet Take 1 tablet (0.1 mg total) by mouth daily. 90 tablet 3    fluticasone (FLONASE) 50 MCG/ACT nasal spray SPRAY 2 SPRAYS INTO EACH NOSTRIL EVERY DAY 48 mL 2   natalizumab (TYSABRI) 300 MG/15ML injection Inject 300 mg into the vein every 30 (thirty) days.      pantoprazole (PROTONIX) 40 MG tablet TAKE 1 TABLET BY MOUTH EVERY DAY 90 tablet 1   Polyethylene Glycol POWD Take 17 g by mouth daily. (Patient taking differently: Take 17 g by mouth daily as needed (constipation.).) 527 g 12   rizatriptan (MAXALT-MLT) 10 MG disintegrating tablet Take 1 tablet (10 mg total) by mouth as needed for migraine. May repeat in 2 hours if needed 9 tablet 11   topiramate (TOPAMAX) 50 MG tablet TAKE 1 TABLET(50 MG) BY MOUTH TWICE DAILY 180 tablet 0   venlafaxine XR (EFFEXOR-XR) 150 MG 24 hr capsule Take 1 capsule (150 mg total) by mouth daily with breakfast. 90 capsule 1  Musculoskeletal: Strength & Muscle Tone: decreased Gait & Station: unsteady Patient leans: N/A Psychiatric Specialty Exam:  Presentation  General Appearance:  Appropriate for Environment  Eye Contact: Good  Speech: Clear and Coherent  Speech Volume: Normal  Handedness: Right   Mood and Affect  Mood: Depressed  Affect: Depressed   Thought Process  Thought Processes: Coherent  Descriptions of Associations:Intact  Orientation:Full (Time, Place and Person)  Thought Content:Logical  History of Schizophrenia/Schizoaffective disorder:No  Duration of Psychotic Symptoms:No data recorded Hallucinations:Hallucinations: None  Ideas of Reference:None  Suicidal Thoughts:Suicidal Thoughts: Yes, Active SI Active Intent and/or Plan: With Intent; With Plan; With Means to Carry Out  Homicidal Thoughts:Homicidal Thoughts: No   Sensorium  Memory: Immediate Good; Recent Good; Remote Good  Judgment: Good  Insight: Good   Executive Functions  Concentration: Good  Attention Span: Good  Recall: Good  Fund of Knowledge: Good  Language: Good   Psychomotor Activity   Psychomotor Activity: Psychomotor Activity: Decreased   Assets  Assets: Communication Skills; Desire for Improvement; Physical Health; Resilience; Social Support   Sleep  Sleep: Sleep: Fair Number of Hours of Sleep: 6   Physical Exam: Physical Exam Vitals and nursing note reviewed.  Constitutional:      Appearance: Normal appearance. She is normal weight.  HENT:     Head: Normocephalic and atraumatic.     Nose: Nose normal.     Mouth/Throat:     Mouth: Mucous membranes are moist.  Cardiovascular:     Rate and Rhythm: Normal rate.     Pulses: Normal pulses.  Pulmonary:     Effort: Pulmonary effort is normal.  Musculoskeletal:        General: Deformity present.     Cervical back: Normal range of motion and neck supple.  Neurological:     General: No focal deficit present.     Mental Status: She is oriented to person, place, and time.  Psychiatric:        Attention and Perception: Attention and perception normal.        Mood and Affect: Mood is anxious and depressed. Affect is blunt and inappropriate.        Speech: Speech normal.        Behavior: Behavior normal. Behavior is cooperative.        Thought Content: Thought content includes suicidal ideation. Thought content includes suicidal plan.        Cognition and Memory: Cognition and memory normal.        Judgment: Judgment normal.    Review of Systems  Psychiatric/Behavioral:  Positive for depression and suicidal ideas. The patient is nervous/anxious.    Blood pressure 117/84, pulse 70, temperature 98.3 F (36.8 C), resp. rate 17, height 5\' 3"  (1.6 m), weight 92.5 kg, SpO2 98 %. Body mass index is 36.14 kg/m.  Treatment Plan Summary: Plan   - Patient does meet the criteria for geriatric-psychiatric inpatient admission  Disposition: Recommend psychiatric Inpatient admission when medically cleared. Supportive therapy provided about ongoing stressors.  Caroline Sauger, NP 01/06/2023 12:19 AM

## 2023-01-06 NOTE — [facility] Group Notes (Signed)
[place] Group Notes:  (Nursing/MHT/Case Management/Adjunct)  Date:  01/06/2023  Time:  8:30 PM  Type of Therapy:   Wrap up  Participation Level:  Did Not Attend  Rose Clements 01/06/2023, 8:30 PM

## 2023-01-06 NOTE — ED Notes (Signed)
Vol /patient to be admitted to [facility] by nurse practitioner Caroline Sauger / attending physician will be Dr. Weber Cooks

## 2023-01-06 NOTE — ED Notes (Signed)
Patient up to bathroom.  Patient made aware of plan of care to be admitted to either BMU or [facility] in [place].  Patient agreeable with plan.

## 2023-01-06 NOTE — ED Notes (Signed)
Breakfast tray and orange juice provided at the bedside

## 2023-01-06 NOTE — BH Assessment (Signed)
Patient is to be admitted to [facility] by Psychiatric Nurse Practitioner Caroline Sauger.  Attending Physician will be Dr.  Weber Cooks .   Patient has been assigned to room 302, by [facility] [place].   Intake Paper Work has been signed and placed on patient chart.  ER staff is aware of the admission: Vaughan Basta, ER Secretary   Dr. Tamala Julian, ER MD  Maudie Mercury, Patient's Nurse  Helene Kelp, Patient Access.   Pt can be transported after dishcarges 01/06/23.

## 2023-01-06 NOTE — ED Notes (Signed)
Pt given lunch tray and water  

## 2023-01-06 NOTE — Plan of Care (Signed)
New admission.  Problem: Education: Goal: Knowledge of General Education information will improve Description: Including pain rating scale, medication(s)/side effects and non-pharmacologic comfort measures Outcome: Not Progressing   Problem: Health Behavior/Discharge Planning: Goal: Ability to manage health-related needs will improve Outcome: Not Progressing   Problem: Clinical Measurements: Goal: Ability to maintain clinical measurements within normal limits will improve Outcome: Not Progressing Goal: Will remain free from infection Outcome: Not Progressing Goal: Diagnostic test results will improve Outcome: Not Progressing Goal: Respiratory complications will improve Outcome: Not Progressing Goal: Cardiovascular complication will be avoided Outcome: Not Progressing   Problem: Activity: Goal: Risk for activity intolerance will decrease Outcome: Not Progressing   Problem: Nutrition: Goal: Adequate nutrition will be maintained Outcome: Not Progressing   Problem: Coping: Goal: Level of anxiety will decrease Outcome: Not Progressing   Problem: Elimination: Goal: Will not experience complications related to bowel motility Outcome: Not Progressing Goal: Will not experience complications related to urinary retention Outcome: Not Progressing   Problem: Pain Managment: Goal: General experience of comfort will improve Outcome: Not Progressing   Problem: Safety: Goal: Ability to remain free from injury will improve Outcome: Not Progressing   Problem: Skin Integrity: Goal: Risk for impaired skin integrity will decrease Outcome: Not Progressing   Problem: Education: Goal: Knowledge of [place] General Education information/materials will improve Outcome: Not Progressing Goal: Emotional status will improve Outcome: Not Progressing Goal: Mental status will improve Outcome: Not Progressing Goal: Verbalization of understanding the information provided will  improve Outcome: Not Progressing   Problem: Safety: Goal: Periods of time without injury will increase Outcome: Not Progressing   Problem: Coping: Goal: Coping ability will improve Outcome: Not Progressing Goal: Will verbalize feelings Outcome: Not Progressing   Problem: Self-Concept: Goal: Will verbalize positive feelings about self Outcome: Not Progressing Goal: Level of anxiety will decrease Outcome: Not Progressing   Problem: Coping: Goal: Coping ability will improve Outcome: Not Progressing   Problem: Self-Concept: Goal: Ability to disclose and discuss suicidal ideas will improve Outcome: Not Progressing Goal: Will verbalize positive feelings about self Outcome: Not Progressing

## 2023-01-07 DIAGNOSIS — F332 Major depressive disorder, recurrent severe without psychotic features: Secondary | ICD-10-CM

## 2023-01-07 DIAGNOSIS — G35 Multiple sclerosis: Secondary | ICD-10-CM | POA: Diagnosis not present

## 2023-01-07 MED ORDER — DULOXETINE HCL 30 MG PO CPEP
60.0000 mg | ORAL_CAPSULE | Freq: Every day | ORAL | Status: DC
  Administered 2023-01-07 – 2023-01-10 (×4): 60 mg via ORAL
  Filled 2023-01-07 (×4): qty 2

## 2023-01-07 MED ORDER — BACLOFEN 10 MG PO TABS
10.0000 mg | ORAL_TABLET | Freq: Four times a day (QID) | ORAL | Status: DC | PRN
  Administered 2023-01-07 – 2023-01-09 (×4): 10 mg via ORAL
  Filled 2023-01-07 (×4): qty 1

## 2023-01-07 MED ORDER — SUMATRIPTAN SUCCINATE 50 MG PO TABS: 100.0000 mg | ORAL_TABLET | Freq: Every day | ORAL | Status: DC | PRN

## 2023-01-07 MED ORDER — PANTOPRAZOLE SODIUM 40 MG PO TBEC
40.0000 mg | DELAYED_RELEASE_TABLET | Freq: Every day | ORAL | Status: DC
  Administered 2023-01-07 – 2023-01-10 (×4): 40 mg via ORAL
  Filled 2023-01-07 (×4): qty 1

## 2023-01-07 MED ORDER — TOPIRAMATE 25 MG PO TABS
50.0000 mg | ORAL_TABLET | Freq: Two times a day (BID) | ORAL | Status: DC
  Administered 2023-01-07 – 2023-01-10 (×6): 50 mg via ORAL
  Filled 2023-01-07 (×7): qty 2

## 2023-01-07 MED ORDER — VENLAFAXINE HCL ER 75 MG PO CP24
75.0000 mg | ORAL_CAPSULE | Freq: Every day | ORAL | Status: DC
  Administered 2023-01-07 – 2023-01-10 (×4): 75 mg via ORAL
  Filled 2023-01-07 (×4): qty 1

## 2023-01-07 NOTE — Group Note (Signed)
[facility] LCSW Group Therapy Note   Group Date: 01/07/2023 Start Time: 1300 End Time: 1400   Type of Therapy/Topic:  Group Therapy:  Balance in Life  Participation Level:  Active   Description of Group:    This group will address the concept of balance and how it feels and looks when one is unbalanced. Patients will be encouraged to process areas in their lives that are out of balance, and identify reasons for remaining unbalanced. Facilitators will guide patients utilizing problem- solving interventions to address and correct the stressor making their life unbalanced. Understanding and applying boundaries will be explored and addressed for obtaining  and maintaining a balanced life. Patients will be encouraged to explore ways to assertively make their unbalanced needs known to significant others in their lives, using other group members and facilitator for support and feedback.  Therapeutic Goals: Patient will identify two or more emotions or situations they have that consume much of in their lives. Patient will identify signs/triggers that life has become out of balance:  Patient will identify two ways to set boundaries in order to achieve balance in their lives:  Patient will demonstrate ability to communicate their needs through discussion and/or role plays  Summary of Patient Progress: Patient was present for the majority of the group process. She arrived a bit late. Pt defined balance as "keeping it together." She shared that stressful situations can throw her off balance. Pt stated that she needs to work on replacing her negative thoughts with positive thoughts in order to help her maintain balance in her live after leaving the hospital. She appeared to have some insight into the topic. Pt appeared open and receptive to feedback/comments from both peers and facilitator.    Therapeutic Modalities:   Cognitive Behavioral Therapy Solution-Focused Therapy Assertiveness Training   Shirl Harris, LCSW

## 2023-01-07 NOTE — BH Assessment (Signed)
INPATIENT RECREATION THERAPY ASSESSMENT  Patient Details Name: Rose Clements MRN: [id number] DOB: 1961-05-14 Today's Date: 01/07/2023       Information Obtained From:  Patient   Able to Participate in Assessment/Interview:  Yes  Patient Presentation:  Tearful   Reason for Admission (Per Patient):  "I tried to take my life a couple weeks ago"  Patient Stressors:  "A lot of different stuff.Marland KitchenMarland KitchenI am paranoid about being around people. Men have followed me around in [place] and then the same man a different time I was in the store asked me to help him look for little girls clothes so I did and then he got all up on me and I felt him get hard while he was behind me. My daughter lives in [place] and we have a good relationship. She wants me to move down there so she can help me but when I get down there it is not like she said it was going to be. My diagnosis and my body changing and not being like I was before.  Coping Skills:    "My [place] and talking to my therapist, Otila Kluver".   Leisure Interests (2+):   "Church, reading my bible, and crossword puzzles on my tablet".   Frequency of Recreation/Participation:  Daily   Awareness of Community Resources:   Yes  Community Resources:   Church   Current Use:  "Not as often anymore. I am scared to leave my house".    Expressed Interest in [employer] Information:  No  [place] of Residence:   [place]   Patient Main Form of Transportation:  "I drive myself but I don't like to go far from home. Some women from church offer to give me rides, as well when they are able to."  Patient Strengths:   "Crochet, I am a good listener and I am a prayer warrior".    Patient Identified Areas of Improvement:   "Standing up for myself and to not be afraid to leave my house".   Patient Goal for Hospitalization:   "Stand firm on my own"  Current SI (including self-harm):   "No"  Current HI:   "No"  Current AVH:   "No"   Breylin Dom E Dianey Suchy, LRT/CTRS  01/07/2023, 11:42 AM

## 2023-01-07 NOTE — Progress Notes (Signed)
Patient pleasant and cooperative. Isolative to self and room. Denies SI, HI,AVH, endorses depression and anxiety. Ambulates with walker. No medications due this shift. Patient slept well.  Encouragement and support provided. Safety checks maintained. Patient receptive and remains safe on unit with q 15 min checks.

## 2023-01-07 NOTE — [facility] Group Notes (Signed)
[place] Group Notes:  (Nursing/MHT/Case Management/Adjunct)  Date:  01/07/2023  Time:  10:06 AM  Type of Therapy:   Community Meeting  Participation Level:  Did Not Attend   Adela Lank [facility] 01/07/2023, 10:06 AM

## 2023-01-07 NOTE — Plan of Care (Signed)
  Problem: Education: Goal: Knowledge of General Education information will improve Description: Including pain rating scale, medication(s)/side effects and non-pharmacologic comfort measures Outcome: Progressing   Problem: Coping: Goal: Level of anxiety will decrease Outcome: Progressing   Problem: Safety: Goal: Ability to remain free from injury will improve Outcome: Progressing   Problem: Education: Goal: Mental status will improve Outcome: Progressing
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
## 2023-01-07 NOTE — Progress Notes (Signed)
D- Patient alert and oriented x 4. Affect flat/mood depressed. Denies SI/ HI/AVH. She states she has chronic pain 8/10 tingling and aching secondary to MS. She states she feels better being here. She does endorse depression Her goal is "feeling better about myself and not being afraid". A- Scheduled medications administered to patient, per MD orders. Support and encouragement provided.  Routine safety checks conducted every 15 minutes.  Patient informed to notify staff with problems or concerns. R- No adverse drug reactions noted. Patient compliant with medications and treatment plan. Patient receptive, calm, and cooperative. She spent most of the day in her room except for meals. She contracts for safety and remains   safe on the unit at this time.

## 2023-01-07 NOTE — Progress Notes (Signed)
Patient calm and pleasant during assessment denying SI/HI/AVH. Pt endorses anxiety and depression. Pt requested some medication for a headache rating the pain 7/10, check MAR. Pt observed interacting appropriately with staff and peers by this Probation officer. Pt compliant with medication administration per MD orders. Pt given education, support, and encouragement to be active in her treatment plan. Pt being monitored Q 15 minutes for safety per unit protocol, remains safe on the unit

## 2023-01-07 NOTE — Plan of Care (Signed)
  Problem: Education: Goal: Knowledge of General Education information will improve Description: Including pain rating scale, medication(s)/side effects and non-pharmacologic comfort measures Outcome: Not Progressing   Problem: Health Behavior/Discharge Planning: Goal: Ability to manage health-related needs will improve Outcome: Not Progressing   Problem: Clinical Measurements: Goal: Ability to maintain clinical measurements within normal limits will improve Outcome: Not Progressing Goal: Will remain free from infection Outcome: Not Progressing Goal: Diagnostic test results will improve Outcome: Not Progressing Goal: Respiratory complications will improve Outcome: Not Progressing Goal: Cardiovascular complication will be avoided Outcome: Not Progressing   Problem: Activity: Goal: Risk for activity intolerance will decrease Outcome: Not Progressing   Problem: Nutrition: Goal: Adequate nutrition will be maintained Outcome: Not Progressing   Problem: Coping: Goal: Level of anxiety will decrease Outcome: Not Progressing   Problem: Elimination: Goal: Will not experience complications related to bowel motility Outcome: Not Progressing Goal: Will not experience complications related to urinary retention Outcome: Not Progressing   Problem: Pain Managment: Goal: General experience of comfort will improve Outcome: Not Progressing   Problem: Safety: Goal: Ability to remain free from injury will improve Outcome: Not Progressing   Problem: Skin Integrity: Goal: Risk for impaired skin integrity will decrease Outcome: Not Progressing   Problem: Education: Goal: Knowledge of [place] General Education information/materials will improve Outcome: Not Progressing Goal: Emotional status will improve Outcome: Not Progressing Goal: Mental status will improve Outcome: Not Progressing Goal: Verbalization of understanding the information provided will improve Outcome: Not  Progressing   Problem: Safety: Goal: Periods of time without injury will increase Outcome: Not Progressing   Problem: Coping: Goal: Coping ability will improve Outcome: Not Progressing Goal: Will verbalize feelings Outcome: Not Progressing   Problem: Self-Concept: Goal: Will verbalize positive feelings about self Outcome: Not Progressing Goal: Level of anxiety will decrease Outcome: Not Progressing   Problem: Coping: Goal: Coping ability will improve Outcome: Not Progressing   Problem: Self-Concept: Goal: Ability to disclose and discuss suicidal ideas will improve Outcome: Not Progressing Goal: Will verbalize positive feelings about self Outcome: Not Progressing

## 2023-01-07 NOTE — BHH Group Notes (Signed)
01/07/2023         Time: 1000      Group Topic/Focus: Goal Setting   Participation Level: Active  Participation Quality: Appropriate and Sharing  Affect: Appropriate  Cognitive: Appropriate, Oriented, and Alert   Additional Comments:  Patient attended Goal Setting group. Patient actively engaged in discussion that educated on SMART goals and came up with 3 different goals for themself. Patient shared "To leave my house" as one of her goals.    Of note, patient received her art work laminated from Armed forces training and education officer. Junita Push Lelan Pons) approved and MHT Cara aware.    Vilma Prader LRT/CTRS  01/07/2023 12:28 PM
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
## 2023-01-08 DIAGNOSIS — F332 Major depressive disorder, recurrent severe without psychotic features: Secondary | ICD-10-CM | POA: Diagnosis not present

## 2023-01-08 MED ORDER — POLYETHYLENE GLYCOL 3350 17 G PO PACK
17.0000 g | PACK | Freq: Every day | ORAL | Status: DC
  Administered 2023-01-08 – 2023-01-10 (×3): 17 g via ORAL
  Filled 2023-01-08 (×3): qty 1

## 2023-01-08 NOTE — Plan of Care (Signed)
D- Patient alert and oriented. Patient presented in a pleasant mood on assessment reporting that she slept fair last night and had complaints of bilateral leg and feet pain. Patient rated her pain a "7/10", in which she requested PRN pain medication for relief. Patient endorsed hopelessness, depression and anxiety on her self-inventory, but states to this writer that "it's getting better". Patient denied SI, HI, AVH at this time. Patient's goal for today is "feeling better and taking care of me", in which "prayer, read, talk with counselor", will help her achieve her goal. During treatment team, patient stated that she wants to have a better outcome and not feeling "anxious when getting out of my house".  A- Scheduled medications administered to patient, per MD orders. Support and encouragement provided.  Routine safety checks conducted every 15 minutes.  Patient informed to notify staff with problems or concerns.  R- No adverse drug reactions noted. Patient contracts for safety at this time. Patient compliant with medications and treatment plan. Patient receptive, calm, and cooperative. Patient interacts well with others on the unit. Patient remains safe at this time.  Problem: Education: Goal: Knowledge of General Education information will improve Description: Including pain rating scale, medication(s)/side effects and non-pharmacologic comfort measures Outcome: Progressing   Problem: Health Behavior/Discharge Planning: Goal: Ability to manage health-related needs will improve Outcome: Progressing   Problem: Clinical Measurements: Goal: Ability to maintain clinical measurements within normal limits will improve Outcome: Progressing Goal: Will remain free from infection Outcome: Progressing Goal: Diagnostic test results will improve Outcome: Progressing Goal: Respiratory complications will improve Outcome: Progressing Goal: Cardiovascular complication will be avoided Outcome: Progressing    Problem: Activity: Goal: Risk for activity intolerance will decrease Outcome: Progressing   Problem: Nutrition: Goal: Adequate nutrition will be maintained Outcome: Progressing   Problem: Coping: Goal: Level of anxiety will decrease Outcome: Progressing   Problem: Elimination: Goal: Will not experience complications related to bowel motility Outcome: Progressing Goal: Will not experience complications related to urinary retention Outcome: Progressing   Problem: Pain Managment: Goal: General experience of comfort will improve Outcome: Progressing   Problem: Safety: Goal: Ability to remain free from injury will improve Outcome: Progressing   Problem: Skin Integrity: Goal: Risk for impaired skin integrity will decrease Outcome: Progressing   Problem: Education: Goal: Knowledge of [place] General Education information/materials will improve Outcome: Progressing Goal: Emotional status will improve Outcome: Progressing Goal: Mental status will improve Outcome: Progressing Goal: Verbalization of understanding the information provided will improve Outcome: Progressing   Problem: Safety: Goal: Periods of time without injury will increase Outcome: Progressing   Problem: Coping: Goal: Coping ability will improve Outcome: Progressing Goal: Will verbalize feelings Outcome: Progressing   Problem: Self-Concept: Goal: Will verbalize positive feelings about self Outcome: Progressing Goal: Level of anxiety will decrease Outcome: Progressing   Problem: Coping: Goal: Coping ability will improve Outcome: Progressing   Problem: Self-Concept: Goal: Ability to disclose and discuss suicidal ideas will improve Outcome: Progressing Goal: Will verbalize positive feelings about self Outcome: Progressing

## 2023-01-08 NOTE — BHH Group Notes (Signed)
[place] Group Notes:  (Nursing/MHT/Case Management/Adjunct)  Date:  01/08/2023  Time:  9:49 AM  Type of Therapy:   community meeting  Participation Level:  Active  Participation Quality:  Appropriate  Affect:  Appropriate  Cognitive:  Appropriate  Insight:  Appropriate  Engagement in Group:  Engaged  Modes of Intervention:  Discussion and Education  Summary of Progress/Problems:  Antonieta Pert 01/08/2023, 9:49 AM

## 2023-01-08 NOTE — BHH Group Notes (Signed)
[place] Group Notes:  (Nursing/MHT/Case Management/Adjunct)  Date:  01/08/2023  Time:  9:13 PM  Type of Therapy:  Group Therapy  Participation Level:  Active  Participation Quality:  Appropriate  Affect:  Appropriate  Cognitive:  Alert and Appropriate  Insight:  Appropriate and Good  Engagement in Group:  Engaged  Modes of Intervention:  Discussion  Summary of Progress/Problems:  Rose Clements 01/08/2023, 9:13 PM

## 2023-01-08 NOTE — BH IP Treatment Plan (Signed)
Interdisciplinary Treatment and Diagnostic Plan Update  01/08/2023 Time of Session: 08:59 Evelyn Aguinaldo MRN: [id number]  Principal Diagnosis: MDD (major depressive disorder)  Secondary Diagnoses: Principal Problem:   MDD (major depressive disorder) Active Problems:   Acid reflux   Multiple sclerosis (HCC)   Current Medications:  Current Facility-Administered Medications  Medication Dose Route Frequency Provider Last Rate Last Admin   acetaminophen (TYLENOL) tablet 650 mg  650 mg Oral Q6H PRN Bennett, Christal H, NP   650 mg at 01/08/23 0841   alum & mag hydroxide-simeth (MAALOX/MYLANTA) 200-200-20 MG/5ML suspension 30 mL  30 mL Oral Q4H PRN Bennett, Christal H, NP       baclofen (LIORESAL) tablet 10 mg  10 mg Oral Q6H PRN Clapacs, Madie Reno, MD   10 mg at 01/08/23 0841   DULoxetine (CYMBALTA) DR capsule 60 mg  60 mg Oral Daily Clapacs, Madie Reno, MD   60 mg at 01/08/23 0841   hydrOXYzine (ATARAX) tablet 25 mg  25 mg Oral TID PRN Bennett, Christal H, NP       magnesium hydroxide (MILK OF MAGNESIA) suspension 30 mL  30 mL Oral Daily PRN Bennett, Christal H, NP       pantoprazole (PROTONIX) EC tablet 40 mg  40 mg Oral Daily Clapacs, John T, MD   40 mg at 01/08/23 0841   polyethylene glycol (MIRALAX / GLYCOLAX) packet 17 g  17 g Oral Daily Clapacs, Madie Reno, MD       SUMAtriptan (IMITREX) tablet 100 mg  100 mg Oral Daily PRN Clapacs, Madie Reno, MD       topiramate (TOPAMAX) tablet 50 mg  50 mg Oral BID Clapacs, Madie Reno, MD   50 mg at 01/08/23 0841   venlafaxine XR (EFFEXOR-XR) 24 hr capsule 75 mg  75 mg Oral Q breakfast Clapacs, Madie Reno, MD   75 mg at 01/08/23 1856   PTA Medications: Medications Prior to Admission  Medication Sig Dispense Refill Last Dose   ALPRAZolam (XANAX) 0.25 MG tablet Take 1 tablet (0.25 mg total) by mouth 2 (two) times daily as needed (panic attacks). 30 tablet 2 unknown at unknown   venlafaxine XR (EFFEXOR-XR) 150 MG 24 hr capsule Take 1 capsule (150 mg total) by mouth daily  with breakfast. 90 capsule 1 unknown at unknown   acetaminophen (TYLENOL) 500 MG tablet Take 500 mg by mouth every 6 (six) hours as needed (for pain.).      baclofen (LIORESAL) 10 MG tablet Take 1 tablet (10 mg total) by mouth See admin instructions. Take 1 tablet (10 mg) by mouth scheduled in the morning & evening, may take an additional dose at bedtime if needed for spasms. 270 each 4 unknown at unknown   cetirizine (ZYRTEC) 10 MG tablet Take 10 mg by mouth daily.      chlorpheniramine-HYDROcodone (TUSSIONEX PENNKINETIC ER) 10-8 MG/5ML Take 5 mLs by mouth every 12 (twelve) hours as needed for cough. (Patient not taking: Reported on 01/06/2023) 140 mL 0    Cholecalciferol (VITAMIN D3) 1000 UNITS CAPS Take 1,000 Units by mouth in the morning and at bedtime.       desmopressin (DDAVP) 0.1 MG tablet Take 1 tablet (0.1 mg total) by mouth daily. 90 tablet 3    fluticasone (FLONASE) 50 MCG/ACT nasal spray SPRAY 2 SPRAYS INTO EACH NOSTRIL EVERY DAY 48 mL 2    natalizumab (TYSABRI) 300 MG/15ML injection Inject 300 mg into the vein every 30 (thirty) days.  pantoprazole (PROTONIX) 40 MG tablet TAKE 1 TABLET BY MOUTH EVERY DAY 90 tablet 1 unknown at unknown   Polyethylene Glycol POWD Take 17 g by mouth daily. (Patient taking differently: Take 17 g by mouth daily as needed (constipation.).) 527 g 12    rizatriptan (MAXALT-MLT) 10 MG disintegrating tablet Take 1 tablet (10 mg total) by mouth as needed for migraine. May repeat in 2 hours if needed 9 tablet 11    topiramate (TOPAMAX) 50 MG tablet TAKE 1 TABLET(50 MG) BY MOUTH TWICE DAILY 180 tablet 0     Patient Stressors: Health problems   Marital or family conflict   Occupational concerns    Patient Strengths: Ability for insight  Marketing executive fund of knowledge  Motivation for treatment/growth  Religious Affiliation  Supportive family/friends   Treatment Modalities: Medication Management, Group therapy, Case management,  1 to 1  session with clinician, Psychoeducation, Recreational therapy.   Physician Treatment Plan for Primary Diagnosis: MDD (major depressive disorder) Long Term Goal(s): Improvement in symptoms so as ready for discharge   Short Term Goals: Ability to maintain clinical measurements within normal limits will improve Ability to verbalize feelings will improve Ability to disclose and discuss suicidal ideas  Medication Management: Evaluate patient's response, side effects, and tolerance of medication regimen.  Therapeutic Interventions: 1 to 1 sessions, Unit Group sessions and Medication administration.  Evaluation of Outcomes: Progressing  Physician Treatment Plan for Secondary Diagnosis: Principal Problem:   MDD (major depressive disorder) Active Problems:   Acid reflux   Multiple sclerosis (HCC)  Long Term Goal(s): Improvement in symptoms so as ready for discharge   Short Term Goals: Ability to maintain clinical measurements within normal limits will improve Ability to verbalize feelings will improve Ability to disclose and discuss suicidal ideas     Medication Management: Evaluate patient's response, side effects, and tolerance of medication regimen.  Therapeutic Interventions: 1 to 1 sessions, Unit Group sessions and Medication administration.  Evaluation of Outcomes: Progressing   RN Treatment Plan for Primary Diagnosis: MDD (major depressive disorder) Long Term Goal(s): Knowledge of disease and therapeutic regimen to maintain health will improve  Short Term Goals: Ability to remain free from injury will improve, Ability to verbalize frustration and anger appropriately will improve, Ability to demonstrate self-control, Ability to participate in decision making will improve, Ability to verbalize feelings will improve, Ability to disclose and discuss suicidal ideas, Ability to identify and develop effective coping behaviors will improve, and Compliance with prescribed medications will  improve  Medication Management: RN will administer medications as ordered by provider, will assess and evaluate patient's response and provide education to patient for prescribed medication. RN will report any adverse and/or side effects to prescribing provider.  Therapeutic Interventions: 1 on 1 counseling sessions, Psychoeducation, Medication administration, Evaluate responses to treatment, Monitor vital signs and CBGs as ordered, Perform/monitor CIWA, COWS, AIMS and Fall Risk screenings as ordered, Perform wound care treatments as ordered.  Evaluation of Outcomes: Progressing   LCSW Treatment Plan for Primary Diagnosis: MDD (major depressive disorder) Long Term Goal(s): Safe transition to appropriate next level of care at discharge, Engage patient in therapeutic group addressing interpersonal concerns.  Short Term Goals: Engage patient in aftercare planning with referrals and resources, Increase social support, Increase ability to appropriately verbalize feelings, Increase emotional regulation, Facilitate acceptance of mental health diagnosis and concerns, and Increase skills for wellness and recovery  Therapeutic Interventions: Assess for all discharge needs, 1 to 1 time with Social worker, Explore available  resources and support systems, Assess for adequacy in community support network, Educate family and significant other(s) on suicide prevention, Complete Psychosocial Assessment, Interpersonal group therapy.  Evaluation of Outcomes: Progressing   Progress in Treatment: Attending groups: Yes. Participating in groups: Yes. Taking medication as prescribed: Yes. Toleration medication: Yes. Family/Significant other contact made: No, will contact:  if given permission. Patient understands diagnosis: Yes. Discussing patient identified problems/goals with staff: Yes. Medical problems stabilized or resolved: Yes. Denies suicidal/homicidal ideation: Yes. Issues/concerns per patient  self-inventory: No. Other: none.  New problem(s) identified: No, Describe:  none identified.  New Short Term/Long Term Goal(s): medication management for mood stabilization; elimination of SI thoughts; development of comprehensive mental wellness plan.  Patient Goals:  "My goals are to feel better and just have an overall better outcomes of not feeling so anxious and getting out of my house."  Discharge Plan or Barriers: CSW will assist pt with development of an appropriate aftercare/discharge plan.   Reason for Continuation of Hospitalization: Depression Medication stabilization Suicidal ideation  Estimated Length of Stay: 5-7 days  Last [place] Suicide Severity Risk Score: [place] Admission (Current) from 01/06/2023 in [place] ED from 01/05/2023 in [facility] at [place] No Risk High Risk       Last PHQ 2/9 Scores:    08/24/2022   10:40 AM 03/26/2022   10:09 AM 03/03/2021   10:28 AM  Depression screen PHQ 2/9  Decreased Interest 3 3 1   Down, Depressed, Hopeless 3 3 0  PHQ - 2 Score 6 6 1   Altered sleeping 3 3   Tired, decreased energy 3 3   Change in appetite 2 3   Feeling bad or failure about yourself  2 1   Trouble concentrating 3 3   Moving slowly or fidgety/restless 3 1   Suicidal thoughts 0 0   PHQ-9 Score 22 20   Difficult doing work/chores Extremely dIfficult Very difficult     Scribe for Treatment Team: Shirl Harris, LCSW 01/08/2023 3:09 PM
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
## 2023-01-08 NOTE — Progress Notes (Signed)
[facility] MD Progress Note  01/08/2023 2:58 PM Rose Clements  MRN:  [id number] Subjective: Follow-up 62 year old woman with depression.  Patient states she is feeling a little better today.  Slept okay last night.  Denies suicidal ideation.  Denies psychotic symptoms.  Tolerating medicine well. Principal Problem: MDD (major depressive disorder) Diagnosis: Principal Problem:   MDD (major depressive disorder) Active Problems:   Acid reflux   Multiple sclerosis ([place])  Total Time spent with patient: 30 minutes  Past Psychiatric History: Past history of depression  Past Medical History:  Past Medical History:  Diagnosis Date   Constipation    Cystitis bacillary, chronic    Depression    Dysphagia    Dx by Clayborn Bigness on 78/2/95   Eosinophilic esophagitis 62/13/0865   GE junction benign stricture s/p balloon dilation, retained food contents. ?gastroparesis versus secondary to EE.   Esophageal dysphagia    GERD (gastroesophageal reflux disease)    Heartburn    Hypertension    Migraine headache    "couple times each month"   Multilevel degenerative disc disease    Multiple sclerosis ([place]) 07/16/2015   Port-A-Cath in place    right side   Presence of intrathecal baclofen pump    right abdomen   Vertigo    Rare - none in last 6 months    Past Surgical History:  Procedure Laterality Date   ABDOMINAL HYSTERECTOMY  2003   baclofen pump insertion Right 01/04/2018   CARPAL TUNNEL RELEASE Right 1997   CESAREAN SECTION  1989   COLONOSCOPY WITH PROPOFOL N/A 10/28/2020   Procedure: COLONOSCOPY WITH PROPOFOL;  Surgeon: Lucilla Lame, MD;  Location: [facility];  Service: Endoscopy;  Laterality: N/A;  PRIORITY 4 port a cath   CYSTECTOMY  2002   From chest   ESOPHAGOGASTRODUODENOSCOPY (EGD) WITH PROPOFOL N/A 05/19/2016   Procedure: ESOPHAGOGASTRODUODENOSCOPY (EGD) WITH PROPOFOL;  Surgeon: Lucilla Lame, MD;  Location: [facility] END[facility]COPY;  Service: Endoscopy;  Laterality: N/A;   IR IMAGING GUIDED  PORT INSERTION  06/24/2020   KIDNEY DONATION Left 2002   OVARIAN CYST REMOVAL Left 2002   Ovarian mass removal   TONSILLECTOMY  1973   UPPER GASTROINTESTINAL ENDOSCOPY  11/19/2014   Benign appearing esophageal stricture-Dilated.   Family History:  Family History  Problem Relation Age of Onset   Diabetes Mother    Hypertension Mother    Heart disease Mother    Pulmonary embolism Mother    Diabetes Father    Diabetes Sister    Hypertension Sister    Heart disease Sister    Bipolar disorder Sister    Narcolepsy Brother    Heart attack Maternal Grandmother    Pancreatic cancer Maternal Grandfather    Breast cancer Maternal Aunt    Family Psychiatric  History: See previous Social History:  Social History   Substance and Sexual Activity  Alcohol Use No   Alcohol/week: 0.0 standard drinks of alcohol   Comment: rare wine on Holidays     Social History   Substance and Sexual Activity  Drug Use No    Social History   Socioeconomic History   Marital status: Married    Spouse name: [name]   Number of children: 1   Years of education: Not on file   Highest education level: Not on file  Occupational History   Occupation: Disability  Tobacco Use   Smoking status: Never   Smokeless tobacco: Never  Vaping Use   Vaping Use: Never used  Substance and Sexual  Activity   Alcohol use: No    Alcohol/week: 0.0 standard drinks of alcohol    Comment: rare wine on Holidays   Drug use: No   Sexual activity: Not Currently  Other Topics Concern   Not on file  Social History Narrative   Lives with husband, Chantae Soo   Right handed    Caffeine use: tea sometimes (mostly herbal)   Social Determinants of Health   Financial Resource Strain: Low Risk  (05/15/2021)   Overall Financial Resource Strain (CARDIA)    Difficulty of Paying Living Expenses: Not very hard  Food Insecurity: No Food Insecurity (01/06/2023)   Hunger Vital Sign    Worried About Running Out of Food in the Last  Year: Never true    Ran Out of Food in the Last Year: Never true  Transportation Needs: No Transportation Needs (01/06/2023)   PRAPARE - Hydrologist (Medical): No    Lack of Transportation (Non-Medical): No  Physical Activity: Not on file  Stress: Not on file  Social Connections: Not on file   Additional Social History:                         Sleep: Fair  Appetite:  Fair  Current Medications: Current Facility-Administered Medications  Medication Dose Route Frequency Provider Last Rate Last Admin   acetaminophen (TYLENOL) tablet 650 mg  650 mg Oral Q6H PRN Bennett, Christal H, NP   650 mg at 01/08/23 0841   alum & mag hydroxide-simeth (MAALOX/MYLANTA) 200-200-20 MG/5ML suspension 30 mL  30 mL Oral Q4H PRN Bennett, Christal H, NP       baclofen (LIORESAL) tablet 10 mg  10 mg Oral Q6H PRN Tres Grzywacz, Madie Reno, MD   10 mg at 01/08/23 0841   DULoxetine (CYMBALTA) DR capsule 60 mg  60 mg Oral Daily Duwane Gewirtz, Madie Reno, MD   60 mg at 01/08/23 0841   hydrOXYzine (ATARAX) tablet 25 mg  25 mg Oral TID PRN Bennett, Christal H, NP       magnesium hydroxide (MILK OF MAGNESIA) suspension 30 mL  30 mL Oral Daily PRN Bennett, Christal H, NP       pantoprazole (PROTONIX) EC tablet 40 mg  40 mg Oral Daily Jaedah Lords T, MD   40 mg at 01/08/23 0841   polyethylene glycol (MIRALAX / GLYCOLAX) packet 17 g  17 g Oral Daily Ersa Delaney T, MD       SUMAtriptan (IMITREX) tablet 100 mg  100 mg Oral Daily PRN Kyndel Egger T, MD       topiramate (TOPAMAX) tablet 50 mg  50 mg Oral BID Mirza Fessel T, MD   50 mg at 01/08/23 0841   venlafaxine XR (EFFEXOR-XR) 24 hr capsule 75 mg  75 mg Oral Q breakfast Sagrario Lineberry, Madie Reno, MD   75 mg at 01/08/23 1638    Lab Results: No results found for this or any previous visit (from the past 48 hour(s)).  Blood Alcohol level:  Lab Results  Component Value Date   ETH <10 46/65/9935    Metabolic Disorder Labs: No results found for:  "HGBA1C", "MPG" No results found for: "PROLACTIN" Lab Results  Component Value Date   CHOL 267 (H) 03/31/2022   TRIG 61 03/31/2022   HDL 80 03/31/2022   CHOLHDL 3.3 03/31/2022   LDLCALC 178 (H) 03/31/2022   LDLCALC 144 (H) 02/07/2020    Physical Findings: AIMS:  , ,  ,  ,  CIWA:    COWS:     Musculoskeletal: Strength & Muscle Tone: within normal limits Gait & Station: normal Patient leans: N/A  Psychiatric Specialty Exam:  Presentation  General Appearance:  Appropriate for Environment  Eye Contact: Good  Speech: Clear and Coherent  Speech Volume: Normal  Handedness: Right   Mood and Affect  Mood: Depressed  Affect: Depressed   Thought Process  Thought Processes: Coherent  Descriptions of Associations:Intact  Orientation:Full (Time, Place and Person)  Thought Content:Logical  History of Schizophrenia/Schizoaffective disorder:No  Duration of Psychotic Symptoms:No data recorded Hallucinations:No data recorded Ideas of Reference:None  Suicidal Thoughts:No data recorded Homicidal Thoughts:No data recorded  Sensorium  Memory: Immediate Good; Recent Good; Remote Good  Judgment: Good  Insight: Good   Executive Functions  Concentration: Good  Attention Span: Good  Recall: Good  Fund of Knowledge: Good  Language: Good   Psychomotor Activity  Psychomotor Activity:No data recorded  Assets  Assets: Communication Skills; Desire for Improvement; Physical Health; Resilience; Social Support   Sleep  Sleep:No data recorded   Physical Exam: Physical Exam Vitals and nursing note reviewed.  Constitutional:      Appearance: Normal appearance.  HENT:     Head: Normocephalic and atraumatic.     Mouth/Throat:     Pharynx: Oropharynx is clear.  Eyes:     Pupils: Pupils are equal, round, and reactive to light.  Cardiovascular:     Rate and Rhythm: Normal rate and regular rhythm.  Pulmonary:     Effort: Pulmonary effort  is normal.     Breath sounds: Normal breath sounds.  Abdominal:     General: Abdomen is flat.     Palpations: Abdomen is soft.  Musculoskeletal:        General: Normal range of motion.  Skin:    General: Skin is warm and dry.  Neurological:     General: No focal deficit present.     Mental Status: She is alert. Mental status is at baseline.  Psychiatric:        Attention and Perception: Attention normal.        Mood and Affect: Mood is anxious.        Speech: Speech normal.        Behavior: Behavior normal.        Thought Content: Thought content normal.        Cognition and Memory: Cognition normal.    Review of Systems  Constitutional: Negative.   HENT: Negative.    Eyes: Negative.   Respiratory: Negative.    Cardiovascular: Negative.   Gastrointestinal: Negative.   Musculoskeletal: Negative.   Skin: Negative.   Neurological: Negative.   Psychiatric/Behavioral: Negative.     Blood pressure 118/74, pulse 68, temperature 97.9 F (36.6 C), temperature source Oral, resp. rate 18, height 5\' 4"  (1.626 m), weight 91.6 kg, SpO2 99 %. Body mass index is 34.67 kg/m.   Treatment Plan Summary: Medication management and Plan no change to medicine for today.  Encourage group attendance.  Ongoing assessment of dangerousness daily.  Possible length of stay just a couple more days.  Alethia Berthold, MD 01/08/2023, 2:58 PM

## 2023-01-09 DIAGNOSIS — F332 Major depressive disorder, recurrent severe without psychotic features: Secondary | ICD-10-CM | POA: Diagnosis not present

## 2023-01-09 NOTE — Progress Notes (Signed)
PT Cancellation Note  Patient Details Name: Rose Clements MRN: [id number] DOB: November 14, 1961   Cancelled Treatment:    Reason Eval/Treat Not Completed: PT screened, no needs identified, will sign off (Pt already seen by OT. Per OT and per pt, pt is at her baseline, no acute problems with mobility. Pt AMB unit with RW, uses a SPC baseline.) Will sign off at this time.   2:14 PM, 01/09/23 Etta Grandchild, PT, DPT Physical Therapist - [facility]  [phone] ([place])     [place] C 01/09/2023, 2:14 PM

## 2023-01-09 NOTE — Plan of Care (Signed)
  Problem: Education: Goal: Knowledge of General Education information will improve Description: Including pain rating scale, medication(s)/side effects and non-pharmacologic comfort measures Outcome: Progressing   Problem: Health Behavior/Discharge Planning: Goal: Ability to manage health-related needs will improve Outcome: Progressing   Problem: Clinical Measurements: Goal: Ability to maintain clinical measurements within normal limits will improve Outcome: Progressing Goal: Will remain free from infection Outcome: Progressing Goal: Diagnostic test results will improve Outcome: Progressing Goal: Respiratory complications will improve Outcome: Progressing Goal: Cardiovascular complication will be avoided Outcome: Progressing   Problem: Self-Concept: Goal: Ability to disclose and discuss suicidal ideas will improve Outcome: Progressing Goal: Will verbalize positive feelings about self Outcome: Progressing   Problem: Coping: Goal: Coping ability will improve Outcome: Progressing   Problem: Self-Concept: Goal: Will verbalize positive feelings about self Outcome: Progressing Goal: Level of anxiety will decrease Outcome: Progressing

## 2023-01-09 NOTE — Evaluation (Signed)
Occupational Therapy Evaluation Patient Details Name: Rose Clements MRN: [id number] DOB: 13-Oct-1961 Today's Date: 01/09/2023   History of Present Illness 62 year old woman with major depressive disorder and PMH significant for MS.   Clinical Impression   Upon entering the room, pt seated on EOB with home clothing donned. Pt is very pleasant and cooperative during session and agreeable to OT evaluation. Pt reports living at home with husband. He does long hauls as a Administrator during the week and is home on the weekends. Pt utilizes RW and SPC within her home and in community. Pt is independent with self care, IADLs, and driving. Pt demonstrates functional mobility within in room, toilet transfers, and obtains items from low shelving while using RW with no LOB. Pt has plans to get out into the community more with friends and church as weather gets warmer. Pt appears to be at baseline and she agrees. OT to complete order at this time. Pt has no skilled acute OT needs at this time.      Recommendations for follow up therapy are one component of a multi-disciplinary discharge planning process, led by the attending physician.  Recommendations may be updated based on patient status, additional functional criteria and insurance authorization.   Follow Up Recommendations  No OT follow up     Assistance Recommended at Discharge None     Functional Status Assessment  Patient has not had a recent decline in their functional status  Equipment Recommendations  None recommended by OT       Precautions / Restrictions Precautions Precautions: Fall      Mobility Bed Mobility Overal bed mobility: Independent                  Transfers Overall transfer level: Modified independent Equipment used: Rolling walker (2 wheels)                          ADL either performed or assessed with clinical judgement   ADL Overall ADL's : At baseline;Modified independent                                              Vision Patient Visual Report: No change from baseline              Pertinent Vitals/Pain Pain Assessment Pain Assessment: No/denies pain     Hand Dominance Right   Extremity/Trunk Assessment Upper Extremity Assessment Upper Extremity Assessment: Overall WFL for tasks assessed   Lower Extremity Assessment Lower Extremity Assessment: Overall WFL for tasks assessed;LLE deficits/detail LLE Deficits / Details: foot drop at baseline -"for several years"       Communication Communication Communication: No difficulties   Cognition Arousal/Alertness: Awake/alert Behavior During Therapy: WFL for tasks assessed/performed Overall Cognitive Status: Within Functional Limits for tasks assessed                                 General Comments: Pt is very pleasant and cooperative during session.                Home Living Family/patient expects to be discharged to:: Private residence Living Arrangements: Spouse/significant other Available Help at Discharge: Family;Available PRN/intermittently Type of Home: House Home Access: Stairs to enter [employer] of Steps: 1 step up from garage  with rails and 1 step down to living room in home   Home Layout: One level     Bathroom Shower/Tub: Walk-in shower         Home Equipment: Conservation officer, nature (2 wheels);Cane - single point;Shower seat - built in;Hand held shower head          Prior Functioning/Environment Prior Level of Function : Independent/Modified Independent;Driving;History of Falls (last six months)               ADLs Comments: Pt endorses use of SPC and RW inside and outside of home (has multiples and keeps in car and home). Mod I for ADLs and IADL tasks.                 OT Goals(Current goals can be found in the care plan section) Acute Rehab OT Goals Patient Stated Goal: to go home OT Goal Formulation: With patient Time For Goal  Achievement: 01/09/23 Potential to Achieve Goals: Good  OT Frequency:         AM-PAC OT "6 Clicks" Daily Activity     Outcome Measure Help from another person eating meals?: None Help from another person taking care of personal grooming?: None Help from another person toileting, which includes using toliet, bedpan, or urinal?: None Help from another person bathing (including washing, rinsing, drying)?: None Help from another person to put on and taking off regular upper body clothing?: None Help from another person to put on and taking off regular lower body clothing?: None 6 Click Score: 24   End of Session Equipment Utilized During Treatment: Rolling walker (2 wheels) Nurse Communication: Mobility status  Activity Tolerance: Patient tolerated treatment well Patient left: in bed                   Time: 1245-1308 OT Time Calculation (min): 23 min Charges:  OT General Charges $OT Visit: 1 Visit OT Evaluation $OT Eval Low Complexity: 1 Low OT Treatments $Self Care/Home Management : 8-22 mins  Darleen Crocker, MS, OTR/L , CBIS ascom [phone]  01/09/23, 1:20 PM

## 2023-01-09 NOTE — Progress Notes (Signed)
Rose Clements has been in the dayroom since the start of the shift watching TV and engaging with her peers. She had snack and participated in tonights group. She had no qhs medication and does not need any meds to help aide in sleep.  She endorses having depression and anxiety, but stated that she is feeling better. She denies si/hi/avh at this encounter.      C Butler-Nicholson, LPN

## 2023-01-09 NOTE — Plan of Care (Signed)
D- Patient alert and oriented. Patient presents in a pleasant mood on assessment reporting that she slept good last night and had continued complaints of bilateral leg and feet pain. Patient rated her pain a "7/10", in which she did request PRN medication for relief. Patient endorsed both depression and anxiety on her self-inventory, stating that "I talked to my daughter and she was questioning me like she's the doctor, asking me questions that I feel like I don't have to answer. Telling me she wants me to move to [place] and I don't want to do that. She's living with her husband and I have mine up here". Patient denies SI, HI, AVH at this time. Patient's goal for today is to "be good to myself", in which she will "continue to do the work", in order to achieve her goal.  A- Scheduled medications administered to patient, per MD orders. Support and encouragement provided.  Routine safety checks conducted every 15 minutes.  Patient informed to notify staff with problems or concerns.  R- No adverse drug reactions noted. Patient contracts for safety at this time. Patient compliant with medications and treatment plan. Patient receptive, calm, and cooperative. Patient interacts well with others on the unit. Patient remains safe at this time.  Problem: Education: Goal: Knowledge of General Education information will improve Description: Including pain rating scale, medication(s)/side effects and non-pharmacologic comfort measures Outcome: Progressing   Problem: Health Behavior/Discharge Planning: Goal: Ability to manage health-related needs will improve Outcome: Progressing   Problem: Clinical Measurements: Goal: Ability to maintain clinical measurements within normal limits will improve Outcome: Progressing Goal: Will remain free from infection Outcome: Progressing Goal: Diagnostic test results will improve Outcome: Progressing Goal: Respiratory complications will improve Outcome: Progressing Goal:  Cardiovascular complication will be avoided Outcome: Progressing   Problem: Activity: Goal: Risk for activity intolerance will decrease Outcome: Progressing   Problem: Nutrition: Goal: Adequate nutrition will be maintained Outcome: Progressing   Problem: Coping: Goal: Level of anxiety will decrease Outcome: Progressing   Problem: Elimination: Goal: Will not experience complications related to bowel motility Outcome: Progressing Goal: Will not experience complications related to urinary retention Outcome: Progressing   Problem: Pain Managment: Goal: General experience of comfort will improve Outcome: Progressing   Problem: Safety: Goal: Ability to remain free from injury will improve Outcome: Progressing   Problem: Skin Integrity: Goal: Risk for impaired skin integrity will decrease Outcome: Progressing   Problem: Education: Goal: Knowledge of [facility] Education information/materials will improve Outcome: Progressing Goal: Emotional status will improve Outcome: Progressing Goal: Mental status will improve Outcome: Progressing Goal: Verbalization of understanding the information provided will improve Outcome: Progressing   Problem: Safety: Goal: Periods of time without injury will increase Outcome: Progressing   Problem: Coping: Goal: Coping ability will improve Outcome: Progressing Goal: Will verbalize feelings Outcome: Progressing   Problem: Self-Concept: Goal: Will verbalize positive feelings about self Outcome: Progressing Goal: Level of anxiety will decrease Outcome: Progressing   Problem: Coping: Goal: Coping ability will improve Outcome: Progressing   Problem: Self-Concept: Goal: Ability to disclose and discuss suicidal ideas will improve Outcome: Progressing Goal: Will verbalize positive feelings about self Outcome: Progressing

## 2023-01-09 NOTE — Progress Notes (Signed)
[facility] MD Progress Note  01/09/2023 11:26 AM Rose Clements  MRN:  [id number] Subjective: Follow-up 62 year old woman with major depression.  Patient seen and chart reviewed.  Patient reports feeling better.  Having no suicidal thoughts.  Affect is more upbeat.  She is generally focused on a lot of positive plans for the future.  Has some frustration that her family especially her daughter is acting like the patient is going to need extra protection at discharge which the patient thinks is excessive.  We talked about this. Principal Problem: MDD (major depressive disorder) Diagnosis: Principal Problem:   MDD (major depressive disorder) Active Problems:   Acid reflux   Multiple sclerosis (HCC)  Total Time spent with patient: 30 minutes  Past Psychiatric History: Past history of depression and anxiety  Past Medical History:  Past Medical History:  Diagnosis Date   Constipation    Cystitis bacillary, chronic    Depression    Dysphagia    Dx by Clayborn Bigness on 40/1/02   Eosinophilic esophagitis 72/53/6644   GE junction benign stricture s/p balloon dilation, retained food contents. ?gastroparesis versus secondary to EE.   Esophageal dysphagia    GERD (gastroesophageal reflux disease)    Heartburn    Hypertension    Migraine headache    "couple times each month"   Multilevel degenerative disc disease    Multiple sclerosis ([place]) 07/16/2015   Port-A-Cath in place    right side   Presence of intrathecal baclofen pump    right abdomen   Vertigo    Rare - none in last 6 months    Past Surgical History:  Procedure Laterality Date   ABDOMINAL HYSTERECTOMY  2003   baclofen pump insertion Right 01/04/2018   CARPAL TUNNEL RELEASE Right 1997   CESAREAN SECTION  1989   COLONOSCOPY WITH PROPOFOL N/A 10/28/2020   Procedure: COLONOSCOPY WITH PROPOFOL;  Surgeon: Lucilla Lame, MD;  Location: [facility];  Service: Endoscopy;  Laterality: N/A;  PRIORITY 4 port a cath   CYSTECTOMY  2002   From  chest   ESOPHAGOGASTRODUODENOSCOPY (EGD) WITH PROPOFOL N/A 05/19/2016   Procedure: ESOPHAGOGASTRODUODENOSCOPY (EGD) WITH PROPOFOL;  Surgeon: Lucilla Lame, MD;  Location: [facility];  Service: Endoscopy;  Laterality: N/A;   IR IMAGING GUIDED PORT INSERTION  06/24/2020   KIDNEY DONATION Left 2002   OVARIAN CYST REMOVAL Left 2002   Ovarian mass removal   TONSILLECTOMY  1973   UPPER GASTROINTESTINAL ENDOSCOPY  11/19/2014   Benign appearing esophageal stricture-Dilated.   Family History:  Family History  Problem Relation Age of Onset   Diabetes Mother    Hypertension Mother    Heart disease Mother    Pulmonary embolism Mother    Diabetes Father    Diabetes Sister    Hypertension Sister    Heart disease Sister    Bipolar disorder Sister    Narcolepsy Brother    Heart attack Maternal Grandmother    Pancreatic cancer Maternal Grandfather    Breast cancer Maternal Aunt    Family Psychiatric  History: See previous Social History:  Social History   Substance and Sexual Activity  Alcohol Use No   Alcohol/week: 0.0 standard drinks of alcohol   Comment: rare wine on Holidays     Social History   Substance and Sexual Activity  Drug Use No    Social History   Socioeconomic History   Marital status: Married    Spouse name: [name]   Number of children: 1   Years of  education: Not on file   Highest education level: Not on file  Occupational History   Occupation: Disability  Tobacco Use   Smoking status: Never   Smokeless tobacco: Never  Vaping Use   Vaping Use: Never used  Substance and Sexual Activity   Alcohol use: No    Alcohol/week: 0.0 standard drinks of alcohol    Comment: rare wine on Holidays   Drug use: No   Sexual activity: Not Currently  Other Topics Concern   Not on file  Social History Narrative   Lives with husband, Eleyna Brugh   Right handed    Caffeine use: tea sometimes (mostly herbal)   Social Determinants of Health   Financial Resource Strain:  Low Risk  (05/15/2021)   Overall Financial Resource Strain (CARDIA)    Difficulty of Paying Living Expenses: Not very hard  Food Insecurity: No Food Insecurity (01/06/2023)   Hunger Vital Sign    Worried About Running Out of Food in the Last Year: Never true    Boon in the Last Year: Never true  Transportation Needs: No Transportation Needs (01/06/2023)   PRAPARE - Hydrologist (Medical): No    Lack of Transportation (Non-Medical): No  Physical Activity: Not on file  Stress: Not on file  Social Connections: Not on file   Additional Social History:                         Sleep: Fair  Appetite:  Fair  Current Medications: Current Facility-Administered Medications  Medication Dose Route Frequency Provider Last Rate Last Admin   acetaminophen (TYLENOL) tablet 650 mg  650 mg Oral Q6H PRN Bennett, Christal H, NP   650 mg at 01/09/23 0855   alum & mag hydroxide-simeth (MAALOX/MYLANTA) 200-200-20 MG/5ML suspension 30 mL  30 mL Oral Q4H PRN Bennett, Christal H, NP       baclofen (LIORESAL) tablet 10 mg  10 mg Oral Q6H PRN Sumi Lye T, MD   10 mg at 01/09/23 0855   DULoxetine (CYMBALTA) DR capsule 60 mg  60 mg Oral Daily Sarya Linenberger, Madie Reno, MD   60 mg at 01/09/23 0092   hydrOXYzine (ATARAX) tablet 25 mg  25 mg Oral TID PRN Bennett, Christal H, NP       magnesium hydroxide (MILK OF MAGNESIA) suspension 30 mL  30 mL Oral Daily PRN Bennett, Christal H, NP       pantoprazole (PROTONIX) EC tablet 40 mg  40 mg Oral Daily Jakavion Bilodeau T, MD   40 mg at 01/09/23 0855   polyethylene glycol (MIRALAX / GLYCOLAX) packet 17 g  17 g Oral Daily Carma Dwiggins T, MD   17 g at 01/09/23 0855   SUMAtriptan (IMITREX) tablet 100 mg  100 mg Oral Daily PRN Donyea Gafford T, MD       topiramate (TOPAMAX) tablet 50 mg  50 mg Oral BID Matilda Fleig T, MD   50 mg at 01/09/23 0855   venlafaxine XR (EFFEXOR-XR) 24 hr capsule 75 mg  75 mg Oral Q breakfast Venia Riveron, Madie Reno, MD    75 mg at 01/09/23 3300    Lab Results: No results found for this or any previous visit (from the past 48 hour(s)).  Blood Alcohol level:  Lab Results  Component Value Date   ETH <10 76/22/6333    Metabolic Disorder Labs: No results found for: "HGBA1C", "MPG" No results found for: "PROLACTIN" Lab  Results  Component Value Date   CHOL 267 (H) 03/31/2022   TRIG 61 03/31/2022   HDL 80 03/31/2022   CHOLHDL 3.3 03/31/2022   LDLCALC 178 (H) 03/31/2022   LDLCALC 144 (H) 02/07/2020    Physical Findings: AIMS:  , ,  ,  ,    CIWA:    COWS:     Musculoskeletal: Strength & Muscle Tone: within normal limits Gait & Station: normal Patient leans: N/A  Psychiatric Specialty Exam:  Presentation  General Appearance:  Appropriate for Environment  Eye Contact: Good  Speech: Clear and Coherent  Speech Volume: Normal  Handedness: Right   Mood and Affect  Mood: Depressed  Affect: Depressed   Thought Process  Thought Processes: Coherent  Descriptions of Associations:Intact  Orientation:Full (Time, Place and Person)  Thought Content:Logical  History of Schizophrenia/Schizoaffective disorder:No  Duration of Psychotic Symptoms:No data recorded Hallucinations:No data recorded Ideas of Reference:None  Suicidal Thoughts:No data recorded Homicidal Thoughts:No data recorded  Sensorium  Memory: Immediate Good; Recent Good; Remote Good  Judgment: Good  Insight: Good   Executive Functions  Concentration: Good  Attention Span: Good  Recall: Good  Fund of Knowledge: Good  Language: Good   Psychomotor Activity  Psychomotor Activity:No data recorded  Assets  Assets: Communication Skills; Desire for Improvement; Physical Health; Resilience; Social Support   Sleep  Sleep:No data recorded   Physical Exam: Physical Exam Vitals and nursing note reviewed.  Constitutional:      Appearance: Normal appearance.  HENT:     Head: Normocephalic  and atraumatic.     Mouth/Throat:     Pharynx: Oropharynx is clear.  Eyes:     Pupils: Pupils are equal, round, and reactive to light.  Cardiovascular:     Rate and Rhythm: Normal rate and regular rhythm.  Pulmonary:     Effort: Pulmonary effort is normal.     Breath sounds: Normal breath sounds.  Abdominal:     General: Abdomen is flat.     Palpations: Abdomen is soft.  Musculoskeletal:        General: Normal range of motion.  Skin:    General: Skin is warm and dry.  Neurological:     General: No focal deficit present.     Mental Status: She is alert. Mental status is at baseline.  Psychiatric:        Attention and Perception: Attention normal.        Mood and Affect: Mood normal.        Speech: Speech normal.        Behavior: Behavior normal.        Thought Content: Thought content normal.        Cognition and Memory: Cognition normal.    Review of Systems  Constitutional: Negative.   HENT: Negative.    Eyes: Negative.   Respiratory: Negative.    Cardiovascular: Negative.   Gastrointestinal: Negative.   Musculoskeletal: Negative.   Skin: Negative.   Neurological: Negative.   Psychiatric/Behavioral: Negative.     Blood pressure 114/73, pulse 85, temperature 98.1 F (36.7 C), temperature source Oral, resp. rate 18, height 5\' 4"  (1.626 m), weight 91.6 kg, SpO2 99 %. Body mass index is 34.67 kg/m.   Treatment Plan Summary: Medication management and Plan vital signs good.  No new physical complaints.  Working with physical therapy and occupational therapy.  Tolerating medicine well.  Discussed her concerns lots of supportive counseling.  Likely discharge Monday.  Alethia Berthold, MD 01/09/2023, 11:26 AM

## 2023-01-10 DIAGNOSIS — F332 Major depressive disorder, recurrent severe without psychotic features: Secondary | ICD-10-CM | POA: Diagnosis not present

## 2023-01-10 MED ORDER — PANTOPRAZOLE SODIUM 40 MG PO TBEC: 40.0000 mg | DELAYED_RELEASE_TABLET | Freq: Every day | ORAL | 1 refills | Status: DC

## 2023-01-10 MED ORDER — POLYETHYLENE GLYCOL 3350 17 G PO PACK: 17.0000 g | PACK | Freq: Every day | ORAL | 1 refills | Status: AC

## 2023-01-10 MED ORDER — RIZATRIPTAN BENZOATE 10 MG PO TBDP: 10.0000 mg | ORAL_TABLET | ORAL | 1 refills | Status: DC | PRN

## 2023-01-10 MED ORDER — TOPIRAMATE 50 MG PO TABS: 50.0000 mg | ORAL_TABLET | Freq: Two times a day (BID) | ORAL | 1 refills | Status: DC

## 2023-01-10 MED ORDER — BACLOFEN 10 MG PO TABS: 10.0000 mg | ORAL_TABLET | Freq: Four times a day (QID) | ORAL | 1 refills | Status: DC | PRN

## 2023-01-10 MED ORDER — VENLAFAXINE HCL ER 75 MG PO CP24: 75.0000 mg | ORAL_CAPSULE | Freq: Every day | ORAL | 1 refills | Status: DC

## 2023-01-10 MED ORDER — HYDROXYZINE HCL 25 MG PO TABS: 25.0000 mg | ORAL_TABLET | Freq: Three times a day (TID) | ORAL | 1 refills | Status: DC | PRN

## 2023-01-10 MED ORDER — DULOXETINE HCL 60 MG PO CPEP: 60.0000 mg | ORAL_CAPSULE | Freq: Every day | ORAL | 1 refills | Status: DC

## 2023-01-10 NOTE — Plan of Care (Signed)
  Problem: Education: Goal: Knowledge of General Education information will improve Description: Including pain rating scale, medication(s)/side effects and non-pharmacologic comfort measures Outcome: Adequate for Discharge   Problem: Health Behavior/Discharge Planning: Goal: Ability to manage health-related needs will improve Outcome: Adequate for Discharge   Problem: Clinical Measurements: Goal: Ability to maintain clinical measurements within normal limits will improve Outcome: Adequate for Discharge Goal: Will remain free from infection Outcome: Adequate for Discharge Goal: Diagnostic test results will improve Outcome: Adequate for Discharge Goal: Respiratory complications will improve Outcome: Adequate for Discharge Goal: Cardiovascular complication will be avoided Outcome: Adequate for Discharge   Problem: Activity: Goal: Risk for activity intolerance will decrease Outcome: Adequate for Discharge   Problem: Nutrition: Goal: Adequate nutrition will be maintained Outcome: Adequate for Discharge   Problem: Coping: Goal: Level of anxiety will decrease Outcome: Adequate for Discharge   Problem: Elimination: Goal: Will not experience complications related to bowel motility Outcome: Adequate for Discharge Goal: Will not experience complications related to urinary retention Outcome: Adequate for Discharge   Problem: Pain Managment: Goal: General experience of comfort will improve Outcome: Adequate for Discharge   Problem: Safety: Goal: Ability to remain free from injury will improve Outcome: Adequate for Discharge   Problem: Skin Integrity: Goal: Risk for impaired skin integrity will decrease Outcome: Adequate for Discharge   Problem: Education: Goal: Knowledge of [facility] Education information/materials will improve 01/10/2023 1322 by Donette Larry, RN Outcome: Adequate for Discharge 01/10/2023 [id number] by Donette Larry, RN Outcome: Progressing Goal:  Emotional status will improve Outcome: Adequate for Discharge Goal: Mental status will improve Outcome: Adequate for Discharge Goal: Verbalization of understanding the information provided will improve 01/10/2023 1322 by Donette Larry, RN Outcome: Adequate for Discharge 01/10/2023 [id number] by Donette Larry, RN Outcome: Progressing   Problem: Safety: Goal: Periods of time without injury will increase Outcome: Adequate for Discharge   Problem: Coping: Goal: Coping ability will improve Outcome: Adequate for Discharge Goal: Will verbalize feelings Outcome: Adequate for Discharge   Problem: Self-Concept: Goal: Will verbalize positive feelings about self Outcome: Adequate for Discharge Goal: Level of anxiety will decrease Outcome: Adequate for Discharge   Problem: Coping: Goal: Coping ability will improve Outcome: Adequate for Discharge   Problem: Self-Concept: Goal: Ability to disclose and discuss suicidal ideas will improve Outcome: Adequate for Discharge Goal: Will verbalize positive feelings about self Outcome: Adequate for Discharge

## 2023-01-10 NOTE — [facility] Group Notes (Signed)
[facility] Group Notes:  (Nursing/MHT/Case Management/Adjunct)  Date:  01/10/2023  Time:  9:47 AM  Type of Therapy:   community metting  Participation Level:  Did Not Attend    Antonieta Pert 01/10/2023, 9:47 AM

## 2023-01-10 NOTE — [facility] Suicide Risk Assessment (Signed)
[facility] INPATIENT:  Family/Significant Other Suicide Prevention Education  Suicide Prevention Education:  Education Completed; Rose Clements,  (husband) has been identified by the patient as the family member/significant other with whom the patient will be residing, and identified as the person(s) who will aid the patient in the event of a mental health crisis (suicidal ideations/suicide attempt).  With written consent from the patient, the family member/significant other has been provided the following suicide prevention education, prior to the and/or following the discharge of the patient.  The suicide prevention education provided includes the following: Suicide risk factors Suicide prevention and interventions National Suicide Hotline telephone number [facility] assessment telephone number [facility] Emergency Assistance [place] and/or Residential Mobile Crisis Unit telephone number  Request made of family/significant other to: Remove weapons (e.g., guns, rifles, knives), all items previously/currently identified as safety concern.   Remove drugs/medications (over-the-counter, prescriptions, illicit drugs), all items previously/currently identified as a safety concern.  The family member/significant other verbalizes understanding of the suicide prevention education information provided.  The family member/significant other agrees to remove the items of safety concern listed above.  Rose Clements 01/10/2023, 1:00 PM

## 2023-01-10 NOTE — Progress Notes (Signed)
  [facility] Adult Case Management Discharge Plan :  Will you be returning to the same living situation after discharge:  Yes,  with husband , Willodean Leven At discharge, do you have transportation home?: Yes,  husband, Wylene Weissman Do you have the ability to pay for your medications: Yes,  has insurance  Release of information consent forms completed and in the chart;  Patient's signature needed at discharge.  Patient to Follow up at: [place] with Dr. Shea Evans  [place] with Miguel Dibble  Next level of care provider has access to [place] and Suicide Prevention discussed: Yes,  with husband Emmani Lesueur     Has patient been referred to the Quitline?: Patient refused referral  Patient has been referred for addiction treatment: Pt. refused referral  Lubertha South, LCSW 01/10/2023, 12:58 PM

## 2023-01-10 NOTE — [facility] Suicide Risk Assessment (Signed)
[facility] Discharge Suicide Risk Assessment   Principal Problem: MDD (major depressive disorder) Discharge Diagnoses: Principal Problem:   MDD (major depressive disorder) Active Problems:   Acid reflux   Multiple sclerosis (HCC)   Total Time spent with patient: 30 minutes  Musculoskeletal: Strength & Muscle Tone: within normal limits Gait & Station: normal Patient leans: N/A  Psychiatric Specialty Exam  Presentation  General Appearance:  Appropriate for Environment  Eye Contact: Good  Speech: Clear and Coherent  Speech Volume: Normal  Handedness: Right   Mood and Affect  Mood: Depressed  Duration of Depression Symptoms: Greater than two weeks  Affect: Depressed   Thought Process  Thought Processes: Coherent  Descriptions of Associations:Intact  Orientation:Full (Time, Place and Person)  Thought Content:Logical  History of Schizophrenia/Schizoaffective disorder:No  Duration of Psychotic Symptoms:No data recorded Hallucinations:No data recorded Ideas of Reference:None  Suicidal Thoughts:No data recorded Homicidal Thoughts:No data recorded  Sensorium  Memory: Immediate Good; Recent Good; Remote Good  Judgment: Good  Insight: Good   Executive Functions  Concentration: Good  Attention Span: Good  Recall: Good  Fund of Knowledge: Good  Language: Good   Psychomotor Activity  Psychomotor Activity:No data recorded  Assets  Assets: Communication Skills; Desire for Improvement; Physical Health; Resilience; Social Support   Sleep  Sleep:No data recorded  Physical Exam: Physical Exam Vitals and nursing note reviewed.  Constitutional:      Appearance: Normal appearance.  HENT:     Head: Normocephalic and atraumatic.     Mouth/Throat:     Pharynx: Oropharynx is clear.  Eyes:     Pupils: Pupils are equal, round, and reactive to light.  Cardiovascular:     Rate and Rhythm: Normal rate and regular rhythm.  Pulmonary:      Effort: Pulmonary effort is normal.     Breath sounds: Normal breath sounds.  Abdominal:     General: Abdomen is flat.     Palpations: Abdomen is soft.  Musculoskeletal:        General: Normal range of motion.     Comments: Uses a walker  Skin:    General: Skin is warm and dry.  Neurological:     General: No focal deficit present.     Mental Status: She is alert. Mental status is at baseline.  Psychiatric:        Attention and Perception: Attention normal.        Mood and Affect: Mood normal.        Speech: Speech normal.        Behavior: Behavior is cooperative.        Thought Content: Thought content normal.        Cognition and Memory: Cognition normal.        Judgment: Judgment normal.    Review of Systems  Constitutional: Negative.   HENT: Negative.    Eyes: Negative.   Respiratory: Negative.    Cardiovascular: Negative.   Gastrointestinal: Negative.   Musculoskeletal: Negative.   Skin: Negative.   Neurological: Negative.   Psychiatric/Behavioral: Negative.     Blood pressure 122/81, pulse 83, temperature 98 F (36.7 C), temperature source Oral, resp. rate 18, height 5\' 4"  (1.626 m), weight 91.6 kg, SpO2 95 %. Body mass index is 34.67 kg/m.  Mental Status Per Nursing Assessment::   On Admission:  NA  Demographic Factors:  NA  Loss Factors: NA  Historical Factors: Impulsivity  Risk Reduction Factors:   Sense of responsibility to family, Living with another person, especially a  relative, Positive social support, Positive therapeutic relationship, and Positive coping skills or problem solving skills  Continued Clinical Symptoms:  Depression:   Impulsivity  Cognitive Features That Contribute To Risk:  None    Suicide Risk:  Minimal: No identifiable suicidal ideation.  Patients presenting with no risk factors but with morbid ruminations; may be classified as minimal risk based on the severity of the depressive symptoms    Plan Of Care/Follow-up  recommendations:  Other:  Patient has been appropriate insightful and denied suicidal ideation throughout hospital stay.  Tolerating treatment well.  Has positive plans for the future.  Will be discharged on current medication with follow-up with her therapist and primary care doctor and referral to outpatient psychiatric treatment.  Alethia Berthold, MD 01/10/2023, 11:47 AM

## 2023-01-10 NOTE — Discharge Summary (Signed)
Physician Discharge Summary Note  Patient:  Rose Clements is an 62 y.o., female MRN:  [id number] DOB:  Feb 16, 1961 Patient phone:  [phone] (home)  Patient address:   [place] [address],  Total Time spent with patient: 30 minutes  Date of Admission:  01/06/2023 Date of Discharge: January 10, 2023  Reason for Admission: Patient was admitted after presenting with a history of having transiently considered suicide by asphyxiation and actually having turned her car on in the garage but then aborted the attempt.  Chronic depression and anxiety some degree of posttraumatic anxiety  Principal Problem: MDD (major depressive disorder) Discharge Diagnoses: Principal Problem:   MDD (major depressive disorder) Active Problems:   Acid reflux   Multiple sclerosis (HCC)   Past Psychiatric History: History of depression and anxiety.  Currently has medication management through primary care doctor and sees a therapist regularly.  Past Medical History:  Past Medical History:  Diagnosis Date   Constipation    Cystitis bacillary, chronic    Depression    Dysphagia    Dx by Clayborn Bigness on 77/8/24   Eosinophilic esophagitis 23/53/6144   GE junction benign stricture s/p balloon dilation, retained food contents. ?gastroparesis versus secondary to EE.   Esophageal dysphagia    GERD (gastroesophageal reflux disease)    Heartburn    Hypertension    Migraine headache    "couple times each month"   Multilevel degenerative disc disease    Multiple sclerosis ([place]) 07/16/2015   Port-A-Cath in place    right side   Presence of intrathecal baclofen pump    right abdomen   Vertigo    Rare - none in last 6 months    Past Surgical History:  Procedure Laterality Date   ABDOMINAL HYSTERECTOMY  2003   baclofen pump insertion Right 01/04/2018   CARPAL TUNNEL RELEASE Right 1997   CESAREAN SECTION  1989   COLONOSCOPY WITH PROPOFOL N/A 10/28/2020   Procedure: COLONOSCOPY WITH PROPOFOL;   Surgeon: Lucilla Lame, MD;  Location: [facility];  Service: Endoscopy;  Laterality: N/A;  PRIORITY 4 port a cath   CYSTECTOMY  2002   From chest   ESOPHAGOGASTRODUODENOSCOPY (EGD) WITH PROPOFOL N/A 05/19/2016   Procedure: ESOPHAGOGASTRODUODENOSCOPY (EGD) WITH PROPOFOL;  Surgeon: Lucilla Lame, MD;  Location: [facility];  Service: Endoscopy;  Laterality: N/A;   IR IMAGING GUIDED PORT INSERTION  06/24/2020   KIDNEY DONATION Left 2002   OVARIAN CYST REMOVAL Left 2002   Ovarian mass removal   TONSILLECTOMY  1973   UPPER GASTROINTESTINAL ENDOSCOPY  11/19/2014   Benign appearing esophageal stricture-Dilated.   Family History:  Family History  Problem Relation Age of Onset   Diabetes Mother    Hypertension Mother    Heart disease Mother    Pulmonary embolism Mother    Diabetes Father    Diabetes Sister    Hypertension Sister    Heart disease Sister    Bipolar disorder Sister    Narcolepsy Brother    Heart attack Maternal Grandmother    Pancreatic cancer Maternal Grandfather    Breast cancer Maternal Aunt    Family Psychiatric  History: See previous Social History:  Social History   Substance and Sexual Activity  Alcohol Use No   Alcohol/week: 0.0 standard drinks of alcohol   Comment: rare wine on Holidays     Social History   Substance and Sexual Activity  Drug Use No    Social History   Socioeconomic History  Marital status: Married    Spouse name: [name]   Number of children: 1   Years of education: Not on file   Highest education level: Not on file  Occupational History   Occupation: Disability  Tobacco Use   Smoking status: Never   Smokeless tobacco: Never  Vaping Use   Vaping Use: Never used  Substance and Sexual Activity   Alcohol use: No    Alcohol/week: 0.0 standard drinks of alcohol    Comment: rare wine on Holidays   Drug use: No   Sexual activity: Not Currently  Other Topics Concern   Not on file  Social History Narrative   Lives  with husband, Herbert Aguinaldo   Right handed    Caffeine use: tea sometimes (mostly herbal)   Social Determinants of Health   Financial Resource Strain: Low Risk  (05/15/2021)   Overall Financial Resource Strain (CARDIA)    Difficulty of Paying Living Expenses: Not very hard  Food Insecurity: No Food Insecurity (01/06/2023)   Hunger Vital Sign    Worried About Running Out of Food in the Last Year: Never true    Ran Out of Food in the Last Year: Never true  Transportation Needs: No Transportation Needs (01/06/2023)   PRAPARE - Hydrologist (Medical): No    Lack of Transportation (Non-Medical): No  Physical Activity: Not on file  Stress: Not on file  Social Connections: Not on file    Hospital Course: Admitted to psychiatric hospital.  15-minute checks continued.  Patient did not display any dangerous aggressive violent or suicidal behavior during her time in the hospital.  She was cooperative with treatment.  We discussed plans about medication management and agreed to a tentative initial plan that would involve gradual cross titration of Effexor back to her previous medication of duloxetine.  At the time of discharge she is on both medicines with the plan to cross taper.  Patient is requesting discharge stating that her husband is in town for the weekend.  He is a long distance truck driver and will be leaving again tomorrow but this gives her a chance to go home with her husband today.  Completely denies suicidal ideation.  Affect upbeat.  Very positive and agreeable to outpatient treatment.  Physical Findings: AIMS:  , ,  ,  ,    CIWA:    COWS:     Musculoskeletal: Strength & Muscle Tone: decreased Gait & Station: unsteady Patient leans: Front   Psychiatric Specialty Exam:  Presentation  General Appearance:  Appropriate for Environment  Eye Contact: Good  Speech: Clear and Coherent  Speech Volume: Normal  Handedness: Right   Mood and Affect   Mood: Depressed  Affect: Depressed   Thought Process  Thought Processes: Coherent  Descriptions of Associations:Intact  Orientation:Full (Time, Place and Person)  Thought Content:Logical  History of Schizophrenia/Schizoaffective disorder:No  Duration of Psychotic Symptoms:No data recorded Hallucinations:No data recorded Ideas of Reference:None  Suicidal Thoughts:No data recorded Homicidal Thoughts:No data recorded  Sensorium  Memory: Immediate Good; Recent Good; Remote Good  Judgment: Good  Insight: Good   Executive Functions  Concentration: Good  Attention Span: Good  Recall: Good  Fund of Knowledge: Good  Language: Good   Psychomotor Activity  Psychomotor Activity:No data recorded  Assets  Assets: Communication Skills; Desire for Improvement; Physical Health; Resilience; Social Support   Sleep  Sleep:No data recorded   Physical Exam: Physical Exam Vitals and nursing note reviewed.  Constitutional:  Appearance: Normal appearance.  HENT:     Head: Normocephalic and atraumatic.     Mouth/Throat:     Pharynx: Oropharynx is clear.  Eyes:     Pupils: Pupils are equal, round, and reactive to light.  Cardiovascular:     Rate and Rhythm: Normal rate and regular rhythm.  Pulmonary:     Effort: Pulmonary effort is normal.     Breath sounds: Normal breath sounds.  Abdominal:     General: Abdomen is flat.     Palpations: Abdomen is soft.  Musculoskeletal:        General: Normal range of motion.     Comments: Patient walks with a walker from multiple sclerosis chronic weakness  Skin:    General: Skin is warm and dry.  Neurological:     General: No focal deficit present.     Mental Status: She is alert. Mental status is at baseline.  Psychiatric:        Attention and Perception: Attention normal.        Mood and Affect: Mood normal.        Speech: Speech normal.        Behavior: Behavior normal.        Thought Content: Thought  content normal.        Cognition and Memory: Cognition normal.        Judgment: Judgment normal.    Review of Systems  Constitutional: Negative.   HENT: Negative.    Eyes: Negative.   Respiratory: Negative.    Cardiovascular: Negative.   Gastrointestinal: Negative.   Musculoskeletal: Negative.   Skin: Negative.   Neurological: Negative.   Psychiatric/Behavioral: Negative.     Blood pressure 122/81, pulse 83, temperature 98 F (36.7 C), temperature source Oral, resp. rate 18, height 5\' 4"  (1.626 m), weight 91.6 kg, SpO2 95 %. Body mass index is 34.67 kg/m.   Social History   Tobacco Use  Smoking Status Never  Smokeless Tobacco Never   Tobacco Cessation:  A prescription for an FDA-approved tobacco cessation medication provided at discharge   Blood Alcohol level:  Lab Results  Component Value Date   ETH <10 59/56/3875    Metabolic Disorder Labs:  No results found for: "HGBA1C", "MPG" No results found for: "PROLACTIN" Lab Results  Component Value Date   CHOL 267 (H) 03/31/2022   TRIG 61 03/31/2022   HDL 80 03/31/2022   CHOLHDL 3.3 03/31/2022   LDLCALC 178 (H) 03/31/2022   LDLCALC 144 (H) 02/07/2020    See Psychiatric Specialty Exam and Suicide Risk Assessment completed by Attending Physician prior to discharge.  Discharge destination:  Home  Is patient on multiple antipsychotic therapies at discharge:  No   Has Patient had three or more failed trials of antipsychotic monotherapy by history:  No  Recommended Plan for Multiple Antipsychotic Therapies: NA  Discharge Instructions     Diet - low sodium heart healthy   Complete by: As directed    Increase activity slowly   Complete by: As directed       Allergies as of 01/10/2023       Reactions   Lisinopril Hives        Medication List     STOP taking these medications    acetaminophen 500 MG tablet Commonly known as: TYLENOL   chlorpheniramine-HYDROcodone 10-8 MG/5ML Commonly known as:  Tussionex Pennkinetic ER   Polyethylene Glycol Powd       TAKE these medications      Indication  ALPRAZolam 0.25 MG tablet Commonly known as: XANAX Take 1 tablet (0.25 mg total) by mouth 2 (two) times daily as needed (panic attacks).  Indication: Feeling Anxious, Panic Disorder   baclofen 10 MG tablet Commonly known as: LIORESAL Take 1 tablet (10 mg total) by mouth every 6 (six) hours as needed for muscle spasms. What changed:  when to take this reasons to take this additional instructions  Indication: Muscle Spasm   cetirizine 10 MG tablet Commonly known as: ZYRTEC Take 10 mg by mouth daily.  Indication: Perennial Allergic Rhinitis   desmopressin 0.1 MG tablet Commonly known as: DDAVP Take 1 tablet (0.1 mg total) by mouth daily.  Indication: Bedwetting   DULoxetine 60 MG capsule Commonly known as: CYMBALTA Take 1 capsule (60 mg total) by mouth daily. Start taking on: January 11, 2023  Indication: Major Depressive Disorder, Musculoskeletal Pain   fluticasone 50 MCG/ACT nasal spray Commonly known as: FLONASE SPRAY 2 SPRAYS INTO EACH NOSTRIL EVERY DAY  Indication: Allergic Rhinitis   hydrOXYzine 25 MG tablet Commonly known as: ATARAX Take 1 tablet (25 mg total) by mouth 3 (three) times daily as needed for anxiety.  Indication: Feeling Anxious   natalizumab 300 MG/15ML injection Commonly known as: TYSABRI Inject 300 mg into the vein every 30 (thirty) days.  Indication: Secondary Progressive Multiple Sclerosis   pantoprazole 40 MG tablet Commonly known as: PROTONIX Take 1 tablet (40 mg total) by mouth daily.  Indication: Gastroesophageal Reflux Disease   polyethylene glycol 17 g packet Commonly known as: MIRALAX / GLYCOLAX Take 17 g by mouth daily. Start taking on: January 11, 2023  Indication: Chronic Constipation of Unknown Cause   rizatriptan 10 MG disintegrating tablet Commonly known as: MAXALT-MLT Take 1 tablet (10 mg total) by mouth as needed for  migraine. May repeat in 2 hours if needed  Indication: Migraine Headache   topiramate 50 MG tablet Commonly known as: TOPAMAX Take 1 tablet (50 mg total) by mouth 2 (two) times daily. What changed:  how much to take how to take this when to take this additional instructions  Indication: Migraine Headache   venlafaxine XR 75 MG 24 hr capsule Commonly known as: EFFEXOR-XR Take 1 capsule (75 mg total) by mouth daily with breakfast. Start taking on: January 11, 2023 What changed:  medication strength how much to take  Indication: Major Depressive Disorder   Vitamin D3 25 MCG (1000 UT) Caps Take 1,000 Units by mouth in the morning and at bedtime.  Indication: Vitamin D Deficiency         Follow-up recommendations:  Other:  Follow-up with therapist and also recommend follow-up with outpatient psychiatric provider.  Referrals will be provided.  Prescription provided  Comments: See above  Signed: Mordecai Rasmussen, MD 01/10/2023, 12:03 PM

## 2023-01-10 NOTE — Progress Notes (Signed)
Patient ID: Rose Clements, female   DOB: [birth year], 62 y.o.   MRN: [id number] Patient denies SI/HI/AVH. Belongings were returned to patient. Discharge instructions  including medication and follow up information were reviewed with patient and understanding was verbalized. Patient was not observed to be in distress at time of discharge. Patient was escorted out with staff to medical mall to be transported home by family member.

## 2023-01-13 ENCOUNTER — Ambulatory Visit (INDEPENDENT_AMBULATORY_CARE_PROVIDER_SITE_OTHER): Payer: Medicare Other | Admitting: Nurse Practitioner

## 2023-01-13 ENCOUNTER — Encounter: Payer: Self-pay | Admitting: Nurse Practitioner

## 2023-01-13 VITALS — BP 129/76 | HR 81 | Temp 98.3°F | Resp 16 | Ht 65.0 in | Wt 206.6 lb

## 2023-01-13 DIAGNOSIS — G35 Multiple sclerosis: Secondary | ICD-10-CM | POA: Diagnosis not present

## 2023-01-13 DIAGNOSIS — Z09 Encounter for follow-up examination after completed treatment for conditions other than malignant neoplasm: Secondary | ICD-10-CM

## 2023-01-13 DIAGNOSIS — F331 Major depressive disorder, recurrent, moderate: Secondary | ICD-10-CM | POA: Diagnosis not present

## 2023-01-13 DIAGNOSIS — F419 Anxiety disorder, unspecified: Secondary | ICD-10-CM

## 2023-01-13 DIAGNOSIS — G35D Multiple sclerosis, unspecified: Secondary | ICD-10-CM

## 2023-01-13 NOTE — Progress Notes (Signed)
[facility] [address] [place] [address] [facility] Discharge Acute Issues Care Follow Up                                                                        Patient Demographics  Rose Clements, is a 61 y.o. female  DOB 1961-06-21  MRN [id number].  Primary MD  Jonetta Osgood, NP   Reason for TCC follow Up - suicidal ideations, major depressive disorder   Past Medical History:  Diagnosis Date   Constipation    Cystitis bacillary, chronic    Depression    Dysphagia    Dx by Clayborn Bigness on [id number]   Eosinophilic esophagitis [id number]   GE junction benign stricture s/p balloon dilation, retained food contents. ?gastroparesis versus secondary to EE.   Esophageal dysphagia    GERD (gastroesophageal reflux disease)    Heartburn    Hypertension    Migraine headache    "couple times each month"   Multilevel degenerative disc disease    Multiple sclerosis ([place]) 07/16/2015   Port-A-Cath in place    right side   Presence of intrathecal baclofen pump    right abdomen   Vertigo    Rare - none in last 6 months    Past Surgical History:  Procedure Laterality Date   ABDOMINAL HYSTERECTOMY  2003   baclofen pump insertion Right 01/04/2018   CARPAL TUNNEL RELEASE Right 1997   CESAREAN SECTION  1989   COLONOSCOPY WITH PROPOFOL N/A 10/28/2020   Procedure: COLONOSCOPY WITH PROPOFOL;  Surgeon: Lucilla Lame, MD;  Location: [facility];  Service: Endoscopy;  Laterality: N/A;  PRIORITY 4 port a cath   CYSTECTOMY  2002   From chest   ESOPHAGOGASTRODUODENOSCOPY (EGD) WITH PROPOFOL N/A 05/19/2016   Procedure: ESOPHAGOGASTRODUODENOSCOPY (EGD) WITH PROPOFOL;  Surgeon: Lucilla Lame, MD;  Location: [facility];  Service: Endoscopy;  Laterality: N/A;   IR IMAGING GUIDED PORT INSERTION  06/24/2020   KIDNEY DONATION Left 2002   OVARIAN CYST REMOVAL Left 2002    Ovarian mass removal   TONSILLECTOMY  1973   UPPER GASTROINTESTINAL ENDOSCOPY  11/19/2014   Benign appearing esophageal stricture-Dilated.       Recent HPI and Hospital Course  Reason for Admission: Patient was admitted after presenting with a history of having transiently considered suicide by asphyxiation and actually having turned her car on in the garage but then aborted the attempt.  Chronic depression and anxiety some degree of posttraumatic anxiety   Hospital Course: Admitted to psychiatric hospital.  15-minute checks continued.  Patient did not display any dangerous aggressive violent or suicidal behavior during her time in the hospital.  She was cooperative with treatment.  We discussed plans about medication management and agreed to a tentative initial plan that would involve gradual cross titration of Effexor back to her previous medication of duloxetine.  At the time of discharge she is on both medicines with the plan to cross taper.  Patient is requesting discharge  stating that her husband is in town for the weekend.  He is a long distance truck driver and will be leaving again tomorrow but this gives her a chance to go home with her husband today.  Completely denies suicidal ideation.  Affect upbeat.  Very positive and agreeable to outpatient treatment.    [facility] Acute Care Issue to be followed in the Clinic   Principal Problem: MDD (major depressive disorder) Discharge Diagnoses: Principal Problem:   MDD (major depressive disorder) Active Problems:   Acid reflux   Multiple sclerosis (HCC)   Subjective:   Rose Clements today has, No headache, No chest pain, No abdominal pain - No Nausea, No new weakness tingling or numbness, No Cough - SOB. Denies any suicidal  ideations   Assessment & Plan   1. Hospital discharge follow-up Was recently hospitalized for suicidal ideations and major depressive disorder. Discharged with plans to attend an outpatient program.   2.  Multiple sclerosis ([place]) Managed by neurology  3. Major depressive disorder, recurrent episode, moderate ([place]) Denies any suicidal ideations, will be going to outpatient treatment  4. Anxiety Improved, takes duloxetine. Has prn hydroxyzine and alprazolam.     Reason for frequent admissions/ER visits    multiple sclerosis Major depressive disorder   Objective:   Vitals:   01/13/23 1141  BP: 129/76  Pulse: 81  Resp: 16  Temp: 98.3 F (36.8 C)  SpO2: 99%  Weight: 206 lb 9.6 oz (93.7 kg)  Height: 5' 5"$  (1.651 m)    Wt Readings from Last 3 Encounters:  01/13/23 206 lb 9.6 oz (93.7 kg)  01/06/23 202 lb (91.6 kg)  01/05/23 204 lb (92.5 kg)    Allergies as of 01/13/2023       Reactions   Lisinopril Hives        Medication List        Accurate as of January 13, 2023 11:59 PM. If you have any questions, ask your nurse or doctor.          ALPRAZolam 0.25 MG tablet Commonly known as: XANAX Take 1 tablet (0.25 mg total) by mouth 2 (two) times daily as needed (panic attacks).   baclofen 10 MG tablet Commonly known as: LIORESAL Take 1 tablet (10 mg total) by mouth every 6 (six) hours as needed for muscle spasms.   cetirizine 10 MG tablet Commonly known as: ZYRTEC Take 10 mg by mouth daily.   desmopressin 0.1 MG tablet Commonly known as: DDAVP Take 1 tablet (0.1 mg total) by mouth daily.   DULoxetine 60 MG capsule Commonly known as: CYMBALTA Take 1 capsule (60 mg total) by mouth daily.   fluticasone 50 MCG/ACT nasal spray Commonly known as: FLONASE SPRAY 2 SPRAYS INTO EACH NOSTRIL EVERY DAY   hydrOXYzine 25 MG tablet Commonly known as: ATARAX Take 1 tablet (25 mg total) by mouth 3 (three) times daily as needed for anxiety.   natalizumab 300 MG/15ML injection Commonly known as: TYSABRI Inject 300 mg into the vein every 30 (thirty) days.   pantoprazole 40 MG tablet Commonly known as: PROTONIX Take 1 tablet (40 mg total) by mouth daily.    polyethylene glycol 17 g packet Commonly known as: MIRALAX / GLYCOLAX Take 17 g by mouth daily.   rizatriptan 10 MG disintegrating tablet Commonly known as: MAXALT-MLT Take 1 tablet (10 mg total) by mouth as needed for migraine. May repeat in 2 hours if needed   topiramate 50 MG tablet Commonly known as: TOPAMAX Take 1  tablet (50 mg total) by mouth 2 (two) times daily.   venlafaxine XR 75 MG 24 hr capsule Commonly known as: EFFEXOR-XR Take 1 capsule (75 mg total) by mouth daily with breakfast.   Vitamin D3 25 MCG (1000 UT) capsule Generic drug: Cholecalciferol Take 1,000 Units by mouth in the morning and at bedtime.         Physical Exam: Constitutional: Patient appears well-developed and well-nourished. Not in obvious distress. HENT: Normocephalic, atraumatic, External right and left ear normal. Oropharynx is clear and moist.  Eyes: Conjunctivae and EOM are normal. PERRLA, no scleral icterus. Neck: Normal ROM. Neck supple. No JVD. No tracheal deviation. No thyromegaly. CVS: RRR, S1/S2 +, no murmurs, no gallops, no carotid bruit.  Pulmonary: Effort and breath sounds normal, no stridor, rhonchi, wheezes, rales.  Abdominal: Soft. BS +, no distension, tenderness, rebound or guarding.  Musculoskeletal: Normal range of motion. No edema and no tenderness.  Lymphadenopathy: No lymphadenopathy noted, cervical, inguinal or axillary Neuro: Alert. Normal reflexes, muscle tone coordination. No cranial nerve deficit. Skin: Skin is warm and dry. No rash noted. Not diaphoretic. No erythema. No pallor. Psychiatric: Normal mood and affect. Behavior, judgment, thought content normal.   Data Review   Micro Results No results found for this or any previous visit (from the past 240 hour(s)).    CBC No results for input(s): "WBC", "HGB", "HCT", "PLT", "MCV", "MCH", "MCHC", "RDW", "LYMPHSABS", "MONOABS", "EOSABS", "BASOSABS", "BANDABS" in the last 168 hours.  Invalid input(s):  "NEUTRABS", "BANDSABD"  Chemistries  No results for input(s): "NA", "K", "CL", "CO2", "GLUCOSE", "BUN", "CREATININE", "CALCIUM", "MG", "AST", "ALT", "ALKPHOS", "BILITOT" in the last 168 hours.  Invalid input(s): "GFRCGP" ------------------------------------------------------------------------------------------------------------------ estimated creatinine clearance is 84.6 mL/min (by C-G formula based on SCr of 0.78 mg/dL). ------------------------------------------------------------------------------------------------------------------ No results for input(s): "HGBA1C" in the last 72 hours. ------------------------------------------------------------------------------------------------------------------ No results for input(s): "CHOL", "HDL", "LDLCALC", "TRIG", "CHOLHDL", "LDLDIRECT" in the last 72 hours. ------------------------------------------------------------------------------------------------------------------ No results for input(s): "TSH", "T4TOTAL", "T3FREE", "THYROIDAB" in the last 72 hours.  Invalid input(s): "FREET3" ------------------------------------------------------------------------------------------------------------------ No results for input(s): "VITAMINB12", "FOLATE", "FERRITIN", "TIBC", "IRON", "RETICCTPCT" in the last 72 hours.  Coagulation profile No results for input(s): "INR", "PROTIME" in the last 168 hours.  No results for input(s): "DDIMER" in the last 72 hours.  Cardiac Enzymes No results for input(s): "CKMB", "TROPONINI", "MYOGLOBIN" in the last 168 hours.  Invalid input(s): "CK" ------------------------------------------------------------------------------------------------------------------ Invalid input(s): "POCBNP"  Return in about 2 months (around 03/14/2023) for previously scheduled, F/U, Helmut Hennon PCP.   Time Spent in minutes  45 Time spent with patient included reviewing progress notes, labs, imaging studies, and discussing plan for follow up.    [place] Controlled Substance Database was reviewed by me for overdose risk score (ORS)  This patient was seen by Jonetta Osgood, FNP-C in collaboration with Dr. Clayborn Bigness as a part of collaborative care agreement.    Jonetta Osgood MSN, FNP-C on 01/13/2023 at 11:57 AM   **Disclaimer: This note may have been dictated with voice recognition software. Similar sounding words can inadvertently be transcribed and this note may contain transcription errors which may not have been corrected upon publication of note.**

## 2023-01-14 DIAGNOSIS — G35 Multiple sclerosis: Secondary | ICD-10-CM | POA: Diagnosis not present

## 2023-01-21 DIAGNOSIS — G35 Multiple sclerosis: Secondary | ICD-10-CM | POA: Diagnosis not present

## 2023-01-28 DIAGNOSIS — G35 Multiple sclerosis: Secondary | ICD-10-CM | POA: Diagnosis not present

## 2023-02-04 DIAGNOSIS — G35 Multiple sclerosis: Secondary | ICD-10-CM | POA: Diagnosis not present

## 2023-02-05 DIAGNOSIS — G35 Multiple sclerosis: Secondary | ICD-10-CM | POA: Diagnosis not present

## 2023-02-11 DIAGNOSIS — G35 Multiple sclerosis: Secondary | ICD-10-CM | POA: Diagnosis not present

## 2023-02-12 DIAGNOSIS — G35 Multiple sclerosis: Secondary | ICD-10-CM | POA: Diagnosis not present

## 2023-02-16 ENCOUNTER — Encounter: Payer: Self-pay | Admitting: Oncology

## 2023-02-19 DIAGNOSIS — G35 Multiple sclerosis: Secondary | ICD-10-CM | POA: Diagnosis not present

## 2023-02-26 DIAGNOSIS — G35 Multiple sclerosis: Secondary | ICD-10-CM | POA: Diagnosis not present

## 2023-03-05 DIAGNOSIS — G35 Multiple sclerosis: Secondary | ICD-10-CM | POA: Diagnosis not present

## 2023-03-08 DIAGNOSIS — G35 Multiple sclerosis: Secondary | ICD-10-CM | POA: Diagnosis not present

## 2023-03-11 DIAGNOSIS — G35 Multiple sclerosis: Secondary | ICD-10-CM | POA: Diagnosis not present

## 2023-03-15 DIAGNOSIS — G35 Multiple sclerosis: Secondary | ICD-10-CM | POA: Diagnosis not present

## 2023-03-16 ENCOUNTER — Other Ambulatory Visit: Payer: Self-pay | Admitting: Psychiatry

## 2023-03-16 DIAGNOSIS — G35 Multiple sclerosis: Secondary | ICD-10-CM | POA: Diagnosis not present

## 2023-03-16 DIAGNOSIS — M6249 Contracture of muscle, multiple sites: Secondary | ICD-10-CM | POA: Diagnosis not present

## 2023-03-16 MED ORDER — DULOXETINE HCL 60 MG PO CPEP: 60.0000 mg | ORAL_CAPSULE | Freq: Every day | ORAL | 0 refills | Status: DC

## 2023-03-18 ENCOUNTER — Encounter: Payer: Self-pay | Admitting: Psychiatry

## 2023-03-18 ENCOUNTER — Ambulatory Visit: Payer: Medicare Other | Admitting: Psychiatry

## 2023-03-18 VITALS — BP 123/79 | HR 82 | Temp 97.3°F | Ht 65.0 in | Wt 200.0 lb

## 2023-03-18 DIAGNOSIS — F332 Major depressive disorder, recurrent severe without psychotic features: Secondary | ICD-10-CM

## 2023-03-18 DIAGNOSIS — F439 Reaction to severe stress, unspecified: Secondary | ICD-10-CM | POA: Diagnosis not present

## 2023-03-18 MED ORDER — DULOXETINE HCL 60 MG PO CPEP: 60.0000 mg | ORAL_CAPSULE | Freq: Every day | ORAL | 0 refills | Status: DC

## 2023-03-18 MED ORDER — VENLAFAXINE HCL ER 37.5 MG PO CP24: 37.5000 mg | ORAL_CAPSULE | Freq: Every day | ORAL | 1 refills | Status: DC

## 2023-03-18 MED ORDER — DULOXETINE HCL 20 MG PO CPEP: 20.0000 mg | ORAL_CAPSULE | Freq: Every morning | ORAL | 0 refills | Status: DC
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
## 2023-03-22 DIAGNOSIS — G35 Multiple sclerosis: Secondary | ICD-10-CM | POA: Diagnosis not present

## 2023-03-29 DIAGNOSIS — G35 Multiple sclerosis: Secondary | ICD-10-CM | POA: Diagnosis not present

## 2023-04-01 ENCOUNTER — Encounter: Payer: Self-pay | Admitting: Nurse Practitioner

## 2023-04-01 ENCOUNTER — Ambulatory Visit (INDEPENDENT_AMBULATORY_CARE_PROVIDER_SITE_OTHER): Payer: Medicare Other | Admitting: Nurse Practitioner

## 2023-04-01 VITALS — BP 139/80 | HR 77 | Temp 97.6°F | Resp 16 | Ht 65.0 in | Wt 205.4 lb

## 2023-04-01 DIAGNOSIS — G35 Multiple sclerosis: Secondary | ICD-10-CM | POA: Diagnosis not present

## 2023-04-01 DIAGNOSIS — K219 Gastro-esophageal reflux disease without esophagitis: Secondary | ICD-10-CM

## 2023-04-01 DIAGNOSIS — R300 Dysuria: Secondary | ICD-10-CM

## 2023-04-01 DIAGNOSIS — Z0001 Encounter for general adult medical examination with abnormal findings: Secondary | ICD-10-CM

## 2023-04-01 DIAGNOSIS — E782 Mixed hyperlipidemia: Secondary | ICD-10-CM

## 2023-04-01 DIAGNOSIS — E538 Deficiency of other specified B group vitamins: Secondary | ICD-10-CM | POA: Diagnosis not present

## 2023-04-01 MED ORDER — PANTOPRAZOLE SODIUM 40 MG PO TBEC
40.0000 mg | DELAYED_RELEASE_TABLET | Freq: Every day | ORAL | 1 refills | Status: DC
Start: 2023-04-01 — End: 2023-11-22

## 2023-04-01 MED ORDER — PANTOPRAZOLE SODIUM 40 MG PO TBEC: 40.0000 mg | DELAYED_RELEASE_TABLET | Freq: Every day | ORAL | 1 refills | Status: DC

## 2023-04-01 MED ORDER — BACLOFEN 10 MG PO TABS
10.0000 mg | ORAL_TABLET | Freq: Three times a day (TID) | ORAL | 1 refills | Status: DC | PRN
Start: 2023-04-01 — End: 2024-01-05

## 2023-04-01 NOTE — Progress Notes (Signed)
[facility] [address] [place], [place] [address]  Internal MEDICINE  Office Visit Note  Patient Name: Rose Clements  [id number]  [id number]  Date of Service: 04/01/2023  Chief Complaint  Patient presents with   Medicare Wellness   Depression   Gastroesophageal Reflux   Hypertension    HPI Rose Clements presents for an annual well visit and physical exam.  Well-appearing 62 y.o.female with multiple sclerosis, migraines, eosinophilic esophagitis, GERD, insomnia, fatigue, and depression Routine CRC screening: due in 2031  Routine mammogram: going to make appt.  Pap smear: hysterectomy  Labs: check cholesterol levels  New or worsening pain: none  RSV, shingles and flu shot done in fall last year       04/01/2023   11:02 AM 03/26/2022   10:15 AM 03/03/2021   10:30 AM  MMSE - Mini Mental State Exam  Orientation to time 5 5 5   Orientation to Place 5 5 5   Registration 3 3 3   Attention/ Calculation 5 5 5   Recall 3 3 3   Language- name 2 objects 2 2 2   Language- repeat 1 1 1   Language- follow 3 step command 3 3 3   Language- read & follow direction 1 1 1   Write a sentence 1 1 1   Copy design 1 1 1   Total score 30 30 30     Functional Status Survey: Is the patient deaf or have difficulty hearing?: No Does the patient have difficulty seeing, even when wearing glasses/contacts?: No Does the patient have difficulty concentrating, remembering, or making decisions?: No Does the patient have difficulty walking or climbing stairs?: No Does the patient have difficulty dressing or bathing?: No Does the patient have difficulty doing errands alone such as visiting a doctor's office or shopping?: No     03/26/2022   10:08 AM 08/24/2022   10:40 AM 09/22/2022    9:36 AM 12/22/2022   10:47 AM 04/01/2023   11:01 AM  Fall Risk  Falls in the past year? 1 1 0 0 1  Was there an injury with Fall? 1 0 0  0  Fall Risk Category Calculator 3 1 0  2  Fall Risk Category (Retired) High Low  Low    (RETIRED) Patient Fall Risk Level High fall risk Low fall risk Low fall risk    Patient at Risk for Falls Due to  Impaired balance/gait No Fall Risks    Fall risk Follow up Falls evaluation completed Falls evaluation completed Falls evaluation completed         03/18/2023    1:57 PM  Depression screen PHQ 2/9  Decreased Interest   Down, Depressed, Hopeless   PHQ - 2 Score   Altered sleeping   Tired, decreased energy   Change in appetite   Feeling bad or failure about yourself    Trouble concentrating   Moving slowly or fidgety/restless   Suicidal thoughts   PHQ-9 Score   Difficult doing work/chores      Information is confidential and restricted. Go to Review Flowsheets to unlock data.       03/18/2023    1:58 PM  GAD 7 : Generalized Anxiety Score  Nervous, Anxious, on Edge   Control/stop worrying   Worry too much - different things   Trouble relaxing   Restless   Easily annoyed or irritable   Afraid - awful might happen   Total GAD 7 Score   Anxiety Difficulty      Information is confidential and restricted. Go to  Review Flowsheets to unlock data.      Current Medication: Outpatient Encounter Medications as of 04/01/2023  Medication Sig Note   cetirizine (ZYRTEC) 10 MG tablet Take 10 mg by mouth daily.    Cholecalciferol (VITAMIN D3) 1000 UNITS CAPS Take 1,000 Units by mouth in the morning and at bedtime.     cyanocobalamin (VITAMIN B12) 1000 MCG tablet Take 1,000 mcg by mouth daily.    desmopressin (DDAVP) 0.1 MG tablet Take 1 tablet (0.1 mg total) by mouth daily. 01/07/2023: Last filled 08/25/22 90 day supply   DULoxetine (CYMBALTA) 20 MG capsule Take 1 capsule (20 mg total) by mouth in the morning. Take daily with 60 mg daily - total of 80 mg daily    DULoxetine (CYMBALTA) 60 MG capsule Take 1 capsule (60 mg total) by mouth daily after supper. Take daily along with 20 mg , total of 80 mg daily    fluticasone (FLONASE) 50 MCG/ACT nasal spray SPRAY 2 SPRAYS  INTO EACH NOSTRIL EVERY DAY    hydrOXYzine (ATARAX) 25 MG tablet Take 1 tablet (25 mg total) by mouth 3 (three) times daily as needed for anxiety.    natalizumab (TYSABRI) 300 MG/15ML injection Inject 300 mg into the vein every 30 (thirty) days.  11/18/2020: JCV ab drawn on 11/11/20 negative, index: 0.15    polyethylene glycol (MIRALAX / GLYCOLAX) 17 g packet Take 17 g by mouth daily.    rizatriptan (MAXALT-MLT) 10 MG disintegrating tablet Take 1 tablet (10 mg total) by mouth as needed for migraine. May repeat in 2 hours if needed    topiramate (TOPAMAX) 50 MG tablet Take 1 tablet (50 mg total) by mouth 2 (two) times daily.    venlafaxine XR (EFFEXOR XR) 37.5 MG 24 hr capsule Take 1 capsule (37.5 mg total) by mouth daily with breakfast.    [DISCONTINUED] ALPRAZolam (XANAX) 0.25 MG tablet Take 1 tablet (0.25 mg total) by mouth 2 (two) times daily as needed (panic attacks). 01/07/2023: Last filled 12/22/22 15 day supply   [DISCONTINUED] baclofen (LIORESAL) 10 MG tablet Take 1 tablet (10 mg total) by mouth every 6 (six) hours as needed for muscle spasms.    [DISCONTINUED] pantoprazole (PROTONIX) 40 MG tablet Take 1 tablet (40 mg total) by mouth daily.    baclofen (LIORESAL) 10 MG tablet Take 1 tablet (10 mg total) by mouth 3 (three) times daily as needed for muscle spasms.    pantoprazole (PROTONIX) 40 MG tablet Take 1 tablet (40 mg total) by mouth daily.    [DISCONTINUED] pantoprazole (PROTONIX) 40 MG tablet Take 1 tablet (40 mg total) by mouth daily.    No facility-administered encounter medications on file as of 04/01/2023.    Surgical History: Past Surgical History:  Procedure Laterality Date   ABDOMINAL HYSTERECTOMY  2003   baclofen pump insertion Right 01/04/2018   CARPAL TUNNEL RELEASE Right 1997   CESAREAN SECTION  1989   COLONOSCOPY WITH PROPOFOL N/A 10/28/2020   Procedure: COLONOSCOPY WITH PROPOFOL;  Surgeon: [name], MD;  Location: [facility];  Service: Endoscopy;   Laterality: N/A;  PRIORITY 4 port a cath   CYSTECTOMY  2002   From chest   ESOPHAGOGASTRODUODENOSCOPY (EGD) WITH PROPOFOL N/A 05/19/2016   Procedure: ESOPHAGOGASTRODUODENOSCOPY (EGD) WITH PROPOFOL;  Surgeon: [name], MD;  Location: [facility];  Service: Endoscopy;  Laterality: N/A;   IR IMAGING GUIDED PORT INSERTION  06/24/2020   KIDNEY DONATION Left 2002   OVARIAN CYST REMOVAL Left 2002   Ovarian mass  removal   TONSILLECTOMY  1973   UPPER GASTROINTESTINAL ENDOSCOPY  11/19/2014   Benign appearing esophageal stricture-Dilated.    Medical History: Past Medical History:  Diagnosis Date   Constipation    Cystitis bacillary, chronic    Depression    Dysphagia    Dx by Beverely Risen on 10/15/14   Eosinophilic esophagitis 11/19/2014   GE junction benign stricture s/p balloon dilation, retained food contents. ?gastroparesis versus secondary to EE.   Esophageal dysphagia    GERD (gastroesophageal reflux disease)    Heartburn    Hypertension    Migraine headache    "couple times each month"   Multilevel degenerative disc disease    Multiple sclerosis 07/16/2015   Port-A-Cath in place    right side   Presence of intrathecal baclofen pump    right abdomen   Vertigo    Rare - none in last 6 months    Family History: Family History  Problem Relation Age of Onset   Diabetes Mother    Hypertension Mother    Heart disease Mother    Pulmonary embolism Mother    Diabetes Sister    Hypertension Sister    Heart disease Sister    Bipolar disorder Sister    Narcolepsy Brother    Dementia Maternal Aunt    Breast cancer Maternal Aunt    Diabetes Maternal Grandmother    Heart attack Maternal Grandmother    Diabetes Maternal Grandfather    Pancreatic cancer Maternal Grandfather     Social History   Socioeconomic History   Marital status: Married    Spouse name: [name]   Number of children: 1   Years of education: Not on file   Highest education level: Some college, no degree   Occupational History   Occupation: Disability  Tobacco Use   Smoking status: Never   Smokeless tobacco: Never  Vaping Use   Vaping Use: Never used  Substance and Sexual Activity   Alcohol use: No    Alcohol/week: 0.0 standard drinks of alcohol    Comment: rare wine on Holidays   Drug use: No   Sexual activity: Not Currently  Other Topics Concern   Not on file  Social History Narrative   Lives with husband, Caryn Diloreto   Right handed    Caffeine use: tea sometimes (mostly herbal)   Social Determinants of Health   Financial Resource Strain: Low Risk  (05/15/2021)   Overall Financial Resource Strain (CARDIA)    Difficulty of Paying Living Expenses: Not very hard  Food Insecurity: No Food Insecurity (01/06/2023)   Hunger Vital Sign    Worried About Running Out of Food in the Last Year: Never true    Ran Out of Food in the Last Year: Never true  Transportation Needs: No Transportation Needs (01/06/2023)   PRAPARE - Administrator, Civil Service (Medical): No    Lack of Transportation (Non-Medical): No  Physical Activity: Not on file  Stress: Not on file  Social Connections: Not on file  Intimate Partner Violence: Not At Risk (01/06/2023)   Humiliation, Afraid, Rape, and Kick questionnaire    Fear of Current or Ex-Partner: No    Emotionally Abused: No    Physically Abused: No    Sexually Abused: No      Review of Systems  Constitutional:  Negative for activity change, appetite change, chills, fatigue, fever and unexpected weight change.  HENT: Negative.  Negative for congestion, ear pain, rhinorrhea, sore throat and trouble  swallowing.   Eyes: Negative.   Respiratory: Negative.  Negative for cough, chest tightness, shortness of breath and wheezing.   Cardiovascular: Negative.  Negative for chest pain and palpitations.  Gastrointestinal: Negative.  Negative for abdominal pain, blood in stool, constipation, diarrhea, nausea and vomiting.  Endocrine: Negative.    Genitourinary: Negative.  Negative for difficulty urinating, dysuria, frequency, hematuria and urgency.  Musculoskeletal:  Positive for gait problem. Negative for arthralgias, back pain, joint swelling, myalgias and neck pain.  Skin: Negative.  Negative for rash and wound.  Allergic/Immunologic: Negative.  Negative for immunocompromised state.  Neurological:  Positive for weakness and headaches. Negative for dizziness, seizures and numbness.  Hematological: Negative.   Psychiatric/Behavioral:  Positive for behavioral problems, depression and sleep disturbance. Negative for self-injury and suicidal ideas. The patient is nervous/anxious.     Vital Signs: BP 139/80   Pulse 77   Temp 97.6 F (36.4 C)   Resp 16   Ht 5\' 5"  (1.651 m)   Wt 205 lb 6.4 oz (93.2 kg)   SpO2 99%   BMI 34.18 kg/m    Physical Exam Vitals reviewed.  Constitutional:      General: She is awake. She is not in acute distress.    Appearance: Normal appearance. She is well-developed and well-groomed. She is obese. She is not ill-appearing or diaphoretic.  HENT:     Head: Normocephalic and atraumatic.     Right Ear: Tympanic membrane, ear canal and external ear normal.     Left Ear: Tympanic membrane, ear canal and external ear normal.     Nose: Nose normal. No congestion or rhinorrhea.     Mouth/Throat:     Lips: Pink.     Mouth: Mucous membranes are moist.     Pharynx: Oropharynx is clear. Uvula midline. No oropharyngeal exudate or posterior oropharyngeal erythema.  Eyes:     General: Lids are normal. Vision grossly intact. Gaze aligned appropriately. No scleral icterus.       Right eye: No discharge.        Left eye: No discharge.     Extraocular Movements: Extraocular movements intact.     Conjunctiva/sclera: Conjunctivae normal.     Pupils: Pupils are equal, round, and reactive to light.     Funduscopic exam:    Right eye: Red reflex present.        Left eye: Red reflex present. Neck:     Thyroid: No  thyromegaly.     Vascular: No carotid bruit or JVD.     Trachea: No tracheal deviation.  Cardiovascular:     Rate and Rhythm: Normal rate and regular rhythm.     Pulses: Normal pulses.     Heart sounds: Normal heart sounds, S1 normal and S2 normal. No murmur heard.    No friction rub. No gallop.  Pulmonary:     Effort: Pulmonary effort is normal. No accessory muscle usage or respiratory distress.     Breath sounds: Normal breath sounds and air entry. No stridor. No wheezing or rales.  Chest:     Chest wall: No tenderness.  Abdominal:     General: Bowel sounds are normal. There is no distension.     Palpations: Abdomen is soft. There is no mass.     Tenderness: There is no abdominal tenderness. There is no guarding or rebound.  Musculoskeletal:        General: No tenderness or deformity. Normal range of motion.     Cervical back: Normal range  of motion and neck supple.     Right lower leg: No edema.     Left lower leg: No edema.  Lymphadenopathy:     Cervical: No cervical adenopathy.  Skin:    General: Skin is warm and dry.     Capillary Refill: Capillary refill takes less than 2 seconds.     Coloration: Skin is not pale.     Findings: No erythema or rash.  Neurological:     Mental Status: She is alert and oriented to person, place, and time.     Cranial Nerves: No cranial nerve deficit.     Motor: Weakness present. No abnormal muscle tone.     Coordination: Coordination normal.     Gait: Gait abnormal.     Deep Tendon Reflexes: Reflexes are normal and symmetric.  Psychiatric:        Mood and Affect: Mood normal.        Behavior: Behavior normal. Behavior is cooperative.        Thought Content: Thought content normal.        Judgment: Judgment normal.        Assessment/Plan: 1. Encounter for routine adult health examination with abnormal findings Age-appropriate preventive screenings and vaccinations discussed, annual physical exam completed. Routine labs for health  maintenance ordered, see below. PHM updated.   2. Multiple sclerosis (HCC) Managed by neurology Continue baclofen as prescribed.  - baclofen (LIORESAL) 10 MG tablet; Take 1 tablet (10 mg total) by mouth 3 (three) times daily as needed for muscle spasms.  Dispense: 270 each; Refill: 1  3. Gastroesophageal reflux disease without esophagitis Continue pantoprazole as prescribed  - pantoprazole (PROTONIX) 40 MG tablet; Take 1 tablet (40 mg total) by mouth daily.  Dispense: 90 tablet; Refill: 1  4. B12 deficiency Routine labs ordered - B12 and Folate Panel  5. Mixed hyperlipidemia Routine labs ordered - Lipid Profile  6. Dysuria Routine urinalysis done  - UA/M w/rflx Culture, Routine - Microscopic Examination      General Counseling: Karletta verbalizes understanding of the findings of todays visit and agrees with plan of treatment. I have discussed any further diagnostic evaluation that may be needed or ordered today. We also reviewed her medications today. she has been encouraged to call the office with any questions or concerns that should arise related to todays visit.    Orders Placed This Encounter  Procedures   UA/M w/rflx Culture, Routine   Lipid Profile   B12 and Folate Panel    Meds ordered this encounter  Medications   DISCONTD: pantoprazole (PROTONIX) 40 MG tablet    Sig: Take 1 tablet (40 mg total) by mouth daily.    Dispense:  90 tablet    Refill:  1    ZERO refills remain on this prescription. Your patient is requesting advance approval of refills for this medication to PREVENT ANY MISSED DOSES   baclofen (LIORESAL) 10 MG tablet    Sig: Take 1 tablet (10 mg total) by mouth 3 (three) times daily as needed for muscle spasms.    Dispense:  270 each    Refill:  1    Note changes, please fill new script   pantoprazole (PROTONIX) 40 MG tablet    Sig: Take 1 tablet (40 mg total) by mouth daily.    Dispense:  90 tablet    Refill:  1    ZERO refills remain on this  prescription. Your patient is requesting advance approval of refills for this medication  to PREVENT ANY MISSED DOSES    Return in about 4 months (around 08/01/2023) for F/U, Campbell Kray PCP.   Total time spent:30 Minutes Time spent includes review of chart, medications, test results, and follow up plan with the patient.   [place] Controlled Substance Database was reviewed by me.  This patient was seen by Sallyanne Kuster, FNP-C in collaboration with Dr. Beverely Risen as a part of collaborative care agreement.  Leeyah Heather R. Tedd Sias, MSN, FNP-C Internal medicine

## 2023-04-02 LAB — UA/M W/RFLX CULTURE, ROUTINE
Bilirubin, UA: NEGATIVE
Glucose, UA: NEGATIVE
Ketones, UA: NEGATIVE
Leukocytes,UA: NEGATIVE
Nitrite, UA: NEGATIVE
Protein,UA: NEGATIVE
RBC, UA: NEGATIVE
Specific Gravity, UA: 1.016 (ref 1.005–1.030)
Urobilinogen, Ur: 0.2 mg/dL (ref 0.2–1.0)
pH, UA: 8 — ABNORMAL HIGH (ref 5.0–7.5)

## 2023-04-02 LAB — MICROSCOPIC EXAMINATION
Bacteria, UA: NONE SEEN
Casts: NONE SEEN /lpf
Epithelial Cells (non renal): NONE SEEN /hpf (ref 0–10)
RBC, Urine: NONE SEEN /hpf (ref 0–2)
WBC, UA: NONE SEEN /hpf (ref 0–5)

## 2023-04-05 DIAGNOSIS — G35 Multiple sclerosis: Secondary | ICD-10-CM | POA: Diagnosis not present

## 2023-04-07 DIAGNOSIS — G35 Multiple sclerosis: Secondary | ICD-10-CM | POA: Diagnosis not present

## 2023-04-08 ENCOUNTER — Other Ambulatory Visit: Payer: Self-pay | Admitting: *Deleted

## 2023-04-08 ENCOUNTER — Telehealth: Payer: Self-pay | Admitting: *Deleted

## 2023-04-08 ENCOUNTER — Other Ambulatory Visit: Payer: Self-pay

## 2023-04-08 DIAGNOSIS — Z79899 Other long term (current) drug therapy: Secondary | ICD-10-CM

## 2023-04-08 DIAGNOSIS — G35 Multiple sclerosis: Secondary | ICD-10-CM

## 2023-04-08 NOTE — Telephone Encounter (Signed)
Placed JCV lab in [facility] lock box for routine lab pick up. Results pending. 

## 2023-04-09 LAB — CBC WITH DIFFERENTIAL/PLATELET
Basophils Absolute: 0.1 10*3/uL (ref 0.0–0.2)
Basos: 1 %
EOS (ABSOLUTE): 0.1 10*3/uL (ref 0.0–0.4)
Eos: 2 %
Hematocrit: 36.7 % (ref 34.0–46.6)
Hemoglobin: 11.7 g/dL (ref 11.1–15.9)
Immature Grans (Abs): 0 10*3/uL (ref 0.0–0.1)
Immature Granulocytes: 0 %
Lymphocytes Absolute: 4.9 10*3/uL — ABNORMAL HIGH (ref 0.7–3.1)
Lymphs: 71 %
MCH: 29.3 pg (ref 26.6–33.0)
MCHC: 31.9 g/dL (ref 31.5–35.7)
MCV: 92 fL (ref 79–97)
Monocytes Absolute: 0.6 10*3/uL (ref 0.1–0.9)
Monocytes: 8 %
NRBC: 1 % — ABNORMAL HIGH (ref 0–0)
Neutrophils Absolute: 1.3 10*3/uL — ABNORMAL LOW (ref 1.4–7.0)
Neutrophils: 18 %
Platelets: 289 10*3/uL (ref 150–450)
RBC: 3.99 x10E6/uL (ref 3.77–5.28)
RDW: 12.6 % (ref 11.7–15.4)
WBC: 7 10*3/uL (ref 3.4–10.8)

## 2023-04-10 ENCOUNTER — Encounter: Payer: Self-pay | Admitting: Nurse Practitioner

## 2023-04-12 ENCOUNTER — Encounter (HOSPITAL_COMMUNITY): Payer: Self-pay

## 2023-04-13 ENCOUNTER — Encounter: Payer: Self-pay | Admitting: Neurology

## 2023-04-13 ENCOUNTER — Telehealth: Payer: Self-pay | Admitting: Neurology

## 2023-04-13 ENCOUNTER — Ambulatory Visit: Payer: Medicare Other | Admitting: Neurology

## 2023-04-13 VITALS — BP 153/86 | HR 64 | Ht 63.0 in | Wt 200.5 lb

## 2023-04-13 DIAGNOSIS — R269 Unspecified abnormalities of gait and mobility: Secondary | ICD-10-CM | POA: Diagnosis not present

## 2023-04-13 DIAGNOSIS — G35 Multiple sclerosis: Secondary | ICD-10-CM | POA: Diagnosis not present

## 2023-04-13 DIAGNOSIS — Z79899 Other long term (current) drug therapy: Secondary | ICD-10-CM

## 2023-04-13 DIAGNOSIS — Z978 Presence of other specified devices: Secondary | ICD-10-CM | POA: Diagnosis not present

## 2023-04-13 DIAGNOSIS — F32A Depression, unspecified: Secondary | ICD-10-CM

## 2023-04-13 DIAGNOSIS — N3941 Urge incontinence: Secondary | ICD-10-CM

## 2023-04-13 DIAGNOSIS — R208 Other disturbances of skin sensation: Secondary | ICD-10-CM | POA: Diagnosis not present

## 2023-04-13 DIAGNOSIS — G43009 Migraine without aura, not intractable, without status migrainosus: Secondary | ICD-10-CM | POA: Diagnosis not present

## 2023-04-13 MED ORDER — TOPIRAMATE 50 MG PO TABS: 50.0000 mg | ORAL_TABLET | Freq: Two times a day (BID) | ORAL | 11 refills | Status: DC

## 2023-04-13 NOTE — Telephone Encounter (Signed)
[employer] medicare NPR sent to [facility] because she has an implanted device on the order. [phone]

## 2023-04-13 NOTE — Progress Notes (Signed)
[facility]  PATIENT: Rose Clements DOB: 1961-01-23  REFERRING DOCTOR OR PCP:  Beverely Risen, MD SOURCE: Patient, notes from primary care and neurology, MRI and laboratory reports, MRI images personally reviewed.  _________________________________   HISTORICAL  CHIEF COMPLAINT:  Chief Complaint  Patient presents with   Room 10    Pt is here Alone. Pt states that she doesn't have any new symptoms to report. Pt states that she is still having spasms in her legs and feet. Pt states that her hand is achy and numb sometimes.  Pt states that she feels like something is crawling on her back.    Rose Clements is a 62 y.o. woman with relapsing multiple sclerosis.    Update 04/13/2023 She is on Tysabri and tolerates it well.    JCV Ab was negative (0.15  04/27/2022).   She has tolerated Tysabri very well.  She has not had any exacerbations.  She had some slow progression of gait disturbance  Gait is the same, some trips but no falls.   Both legs are weak, slightly weaker on right hough spasms sometimes worse on left.     She also has right hand weakness.  .Balance is mildly reduced.   She uses a walker.   She can go 200 + feet with her walker   She seldom uses the cane outside the home.  She has a right greater than left foot drop She has some tingling in legs bilaterally, left > right.  She has stiffness in her legs bilaterally.  Baclofen helps her some.   She does not become sleepy.      She has vertigo.    LFCN pain resolved and she stopped lamotrigine.    She has a baclofen pump  since 2019 for MS related spasticity.   She nots a little more spasticity this year than last year.   .   Dose is low (104 mcg/day at 4.3 / hr).     She was switched to higher concentration so only needs a refill twice a year now.   She still takes rare 10 mg baclofen pills as needed when she notes more tightness in her legs.  As she relies some on the spasticity to walk.  I am reluctant to raise the dose  further unless she has a lot more symptoms.  She had a suicide attempt (running car engine in garage) and did a 5 day inpatient psychiatry stay.    She is on duloxetine (venlafaxine being tapered)   She is seeing Dr. Elna Breslow and a therapist.  She has fatigue.  She tried Modafinil and felt it dried her out and did not help much.   She sleeps poorly most  night.      She reports migraines are doing okay with Topamax.  The imipramine was discontinued when she had her psychiatry admission.   She has a headache about 1/week.   There are migrainous features with nausea, photophobia/phonopobia.   She takes Tylenol when one occurs which usually helps.  She can take the rizatriptan if 1 occurs.  DDAVP seemed to help her nocturia more at first than now..    Because she was a kidney donor, she has only 1 kidney.  Creatinine and BUN have remained in the normal levels over the last few years but I would be reluctant to raise the dose further  She has fatigue and sleeiness.     Modafinil helped some but she stopped  She does not sleep well.   She snores but does not have OSA.    She reports PSG with Dr. Freda Munro in [place] showed narcolepsy.  She does not described cataplexy symptoms     She has vivid dreams and occasional hypnagogic hallucinations and sleep paralysis.       MS HIstory:  She  was diagnosed with relapsing remitting MS in 2011 after she presented with stiffness in her left arm and a clumsy gait with some falls.   In retrospect some of the leg symptoms started a little earlier.    She had MRI's and LP with lesions and CSF findings consistent with MS.    She was placed on Avonex.   She did ok initially but then had an exacerbation with more issues with her legs.   She was started on Tysabri about 2016 after these issues.   She has done well with Tysabri and has not had any exacerbations.   Initially, she was diagnosed and treated in [place].  She moved to [facility]) in 2016 and  has seen Dr. Ronalee Red at [facility] since that time.  She transferred care to [place] mid 2021.    IMAGING: MRI of the cervical spine 03/07/2018 and 05/15/2018 and 01/19/2020 all show a couple T2 hyperintense lesions within the spinal cord.  One to the left adjacent to C2-C3 and 1 to the right adjacent to C4.    Also has right thyroid cyst.   MRI of the brain 05/15/2018 and 03/07/2018 and 2/212/2021 show multiple T2/flair hyperintense foci.  Somewhat nonspecific but a few are periventricular and juxtacortical.  There is no change between the 3 studies.     MRI of the thoracic spine 05/16/2015 did not show any definite lesions.   Hemangiomas within the T2 and T6 vertebral body.    REVIEW OF SYSTEMS: Constitutional: No fevers, chills, sweats, or change in appetite Eyes: No visual changes, double vision, eye pain Ear, nose and throat: No hearing loss, ear pain, nasal congestion, sore throat Cardiovascular: No chest pain, palpitations Respiratory:  No shortness of breath at rest or with exertion.   No wheezes GastrointestinaI: No nausea, vomiting, diarrhea, abdominal pain, fecal incontinence Genitourinary:  No dysuria, urinary retention or frequency.  No nocturia. Musculoskeletal:  No neck pain, back pain Integumentary: No rash, pruritus, skin lesions Neurological: as above Psychiatric: No depression at this time.  No anxiety Endocrine: No palpitations, diaphoresis, change in appetite, change in weigh or increased thirst Hematologic/Lymphatic:  No anemia, purpura, petechiae. Allergic/Immunologic: No itchy/runny eyes, nasal congestion, recent allergic reactions, rashes  ALLERGIES: Allergies  Allergen Reactions   Lisinopril Hives    HOME MEDICATIONS:  Current Outpatient Medications:    baclofen (LIORESAL) 10 MG tablet, Take 1 tablet (10 mg total) by mouth 3 (three) times daily as needed for muscle spasms., Disp: 270 each, Rfl: 1   cetirizine (ZYRTEC) 10 MG tablet, Take 10 mg by mouth daily., Disp: , Rfl:     Cholecalciferol (VITAMIN D3) 1000 UNITS CAPS, Take 1,000 Units by mouth in the morning and at bedtime. , Disp: , Rfl:    cyanocobalamin (VITAMIN B12) 1000 MCG tablet, Take 1,000 mcg by mouth daily., Disp: , Rfl:    desmopressin (DDAVP) 0.1 MG tablet, Take 1 tablet (0.1 mg total) by mouth daily., Disp: 90 tablet, Rfl: 3   DULoxetine (CYMBALTA) 20 MG capsule, Take 1 capsule (20 mg total) by mouth in the morning. Take daily with 60 mg daily - total of  80 mg daily, Disp: 90 capsule, Rfl: 0   DULoxetine (CYMBALTA) 60 MG capsule, Take 1 capsule (60 mg total) by mouth daily after supper. Take daily along with 20 mg , total of 80 mg daily, Disp: 90 capsule, Rfl: 0   fluticasone (FLONASE) 50 MCG/ACT nasal spray, SPRAY 2 SPRAYS INTO EACH NOSTRIL EVERY DAY, Disp: 48 mL, Rfl: 2   hydrOXYzine (ATARAX) 25 MG tablet, Take 1 tablet (25 mg total) by mouth 3 (three) times daily as needed for anxiety., Disp: 30 tablet, Rfl: 1   natalizumab (TYSABRI) 300 MG/15ML injection, Inject 300 mg into the vein every 30 (thirty) days. , Disp: , Rfl:    pantoprazole (PROTONIX) 40 MG tablet, Take 1 tablet (40 mg total) by mouth daily., Disp: 90 tablet, Rfl: 1   polyethylene glycol (MIRALAX / GLYCOLAX) 17 g packet, Take 17 g by mouth daily., Disp: 28 each, Rfl: 1   rizatriptan (MAXALT-MLT) 10 MG disintegrating tablet, Take 1 tablet (10 mg total) by mouth as needed for migraine. May repeat in 2 hours if needed, Disp: 9 tablet, Rfl: 1   topiramate (TOPAMAX) 50 MG tablet, Take 1 tablet (50 mg total) by mouth 2 (two) times daily., Disp: 60 tablet, Rfl: 1   venlafaxine XR (EFFEXOR XR) 37.5 MG 24 hr capsule, Take 1 capsule (37.5 mg total) by mouth daily with breakfast., Disp: 30 capsule, Rfl: 1  PAST MEDICAL HISTORY: Past Medical History:  Diagnosis Date   Constipation    Cystitis bacillary, chronic    Depression    Dysphagia    Dx by Beverely Risen on 10/15/14   Eosinophilic esophagitis 11/19/2014   GE junction benign stricture s/p  balloon dilation, retained food contents. ?gastroparesis versus secondary to EE.   Esophageal dysphagia    GERD (gastroesophageal reflux disease)    Heartburn    Hypertension    Mental depression 2024   Migraine headache    "couple times each month"   Multilevel degenerative disc disease    Multiple sclerosis (HCC) 07/16/2015   Port-A-Cath in place    right side   Presence of intrathecal baclofen pump    right abdomen   Vertigo    Rare - none in last 6 months    PAST SURGICAL HISTORY: Past Surgical History:  Procedure Laterality Date   ABDOMINAL HYSTERECTOMY  2003   baclofen pump insertion Right 01/04/2018   CARPAL TUNNEL RELEASE Right 1997   CESAREAN SECTION  1989   COLONOSCOPY WITH PROPOFOL N/A 10/28/2020   Procedure: COLONOSCOPY WITH PROPOFOL;  Surgeon: [name], MD;  Location: [facility];  Service: Endoscopy;  Laterality: N/A;  PRIORITY 4 port a cath   CYSTECTOMY  2002   From chest   ESOPHAGOGASTRODUODENOSCOPY (EGD) WITH PROPOFOL N/A 05/19/2016   Procedure: ESOPHAGOGASTRODUODENOSCOPY (EGD) WITH PROPOFOL;  Surgeon: [name], MD;  Location: [facility];  Service: Endoscopy;  Laterality: N/A;   IR IMAGING GUIDED PORT INSERTION  06/24/2020   KIDNEY DONATION Left 2002   OVARIAN CYST REMOVAL Left 2002   Ovarian mass removal   TONSILLECTOMY  1973   UPPER GASTROINTESTINAL ENDOSCOPY  11/19/2014   Benign appearing esophageal stricture-Dilated.    FAMILY HISTORY: Family History  Problem Relation Age of Onset   Diabetes Mother    Hypertension Mother    Heart disease Mother    Pulmonary embolism Mother    Diabetes Sister    Hypertension Sister    Heart disease Sister    Bipolar disorder Sister    Narcolepsy  Brother    Dementia Maternal Aunt    Breast cancer Maternal Aunt    Diabetes Maternal Grandmother    Heart attack Maternal Grandmother    Diabetes Maternal Grandfather    Pancreatic cancer Maternal Grandfather     SOCIAL HISTORY:  Social  History   Socioeconomic History   Marital status: Married    Spouse name: [name]   Number of children: 1   Years of education: Not on file   Highest education level: Some college, no degree  Occupational History   Occupation: Disability  Tobacco Use   Smoking status: Never   Smokeless tobacco: Never  Vaping Use   Vaping Use: Never used  Substance and Sexual Activity   Alcohol use: No    Alcohol/week: 0.0 standard drinks of alcohol    Comment: rare wine on Holidays   Drug use: No   Sexual activity: Not Currently  Other Topics Concern   Not on file  Social History Narrative   Lives with husband, Ronique Cito   Right handed    Caffeine use: tea sometimes (mostly herbal)   Social Determinants of Health   Financial Resource Strain: Low Risk  (05/15/2021)   Overall Financial Resource Strain (CARDIA)    Difficulty of Paying Living Expenses: Not very hard  Food Insecurity: No Food Insecurity (01/06/2023)   Hunger Vital Sign    Worried About Running Out of Food in the Last Year: Never true    Ran Out of Food in the Last Year: Never true  Transportation Needs: No Transportation Needs (01/06/2023)   PRAPARE - Administrator, Civil Service (Medical): No    Lack of Transportation (Non-Medical): No  Physical Activity: Not on file  Stress: Not on file  Social Connections: Not on file  Intimate Partner Violence: Not At Risk (01/06/2023)   Humiliation, Afraid, Rape, and Kick questionnaire    Fear of Current or Ex-Partner: No    Emotionally Abused: No    Physically Abused: No    Sexually Abused: No     PHYSICAL EXAM  Vitals:   04/13/23 1503  BP: (!) 153/86  Pulse: 64  Weight: 200 lb 8 oz (90.9 kg)  Height: 5\' 3"  (1.6 m)    Body mass index is 35.52 kg/m.   General: The patient is well-developed and well-nourished and in no acute distress  Skin: Extremities are without rash or edema.   Neurologic Exam  Mental status: The patient is alert and oriented x 3 at  the time of the examination. The patient has apparent normal recent and remote memory, with an apparently normal attention span and concentration ability.   Speech is normal.  Cranial nerves: Extraocular movements are full.  Facial strength was normal.  No obvious hearing deficits are noted.  Motor:  Muscle bulk is normal.   She has increased muscle tone in legs.  Muscle tone is increased, left greater than right leg.  She has reduced rapid alternating movements in the left arm and strength was 4/5 in the triceps and 5/5 elsewhere.  Strength was 4 /5 in both legs  Sensory: Sensory testing is intact to pinprick, soft touch and vibration sensation in all 4 extremities.  Coordination: Cerebellar testing reveals good finger-nose-finger and slightly reduced heel-to-shin bilaterally.  Gait and station: Station is normal.  She can walk without a walker but has a wide stance and reduced stride.  Gait is much better with the walker.  She is unable to tandem walk.  Romberg  is negative.  Reflexes: Deep tendon reflexes are increased in the legs, left greater than right and there are crossed abductor reflexes.  Slight asymmetry in the arms.     DIAGNOSTIC DATA (LABS, IMAGING, TESTING) - I reviewed patient records, labs, notes, testing and imaging myself where available.  Lab Results  Component Value Date   WBC 7.0 04/08/2023   HGB 11.7 04/08/2023   HCT 36.7 04/08/2023   MCV 92 04/08/2023   PLT 289 04/08/2023      Component Value Date/Time   NA 140 01/05/2023 1547   NA 143 03/31/2022 1006   K 3.9 01/05/2023 1547   CL 108 01/05/2023 1547   CO2 24 01/05/2023 1547   GLUCOSE 108 (H) 01/05/2023 1547   BUN 12 01/05/2023 1547   BUN 10 03/31/2022 1006   CREATININE 0.78 01/05/2023 1547   CALCIUM 9.2 01/05/2023 1547   PROT 7.2 01/05/2023 1547   PROT 6.5 03/31/2022 1006   ALBUMIN 4.2 01/05/2023 1547   ALBUMIN 4.5 03/31/2022 1006   AST 18 01/05/2023 1547   ALT 9 01/05/2023 1547   ALKPHOS 75  01/05/2023 1547   BILITOT 0.5 01/05/2023 1547   BILITOT 0.2 03/31/2022 1006   GFRNONAA >60 01/05/2023 1547   GFRAA 76 02/07/2020 1050   Lab Results  Component Value Date   CHOL 267 (H) 03/31/2022   HDL 80 03/31/2022   LDLCALC 178 (H) 03/31/2022   TRIG 61 03/31/2022   CHOLHDL 3.3 03/31/2022   No results found for: "HGBA1C" Lab Results  Component Value Date   VITAMINB12 445 09/15/2022   Lab Results  Component Value Date   TSH 2.320 03/31/2022       ASSESSMENT AND PLAN  Multiple sclerosis (HCC)  High risk medication use  Dysesthesia  Migraine without aura and without status migrainosus, not intractable  Urge incontinence  Presence of intrathecal pump  Depression, unspecified depression type  1.   Continue Tysabri infusions.   JCV antibody pending and has been negative  She now has a central line.     Check MRI brain and cervical spine to see if subclinical progression (if this is occurring, consider anti-CD20 agent if occurring) 2.    Continue topiramate and maxalt prn for migraine.   3.   Stay active and exercise as tolerated. 4.     continue low-dose desmopressin at night to try to help the nocturia.    5.   She has a baclofen pump. --- only 104 mcg/day but no severe spasms on this dose., we discussed we could do a mild increase due to spasicity but because of weakness we can't increase much.   [facility] is now doing.     6.    Return for follow-up in 6 months  or call sooner if new or worsening neurologic symptoms.    Tiamarie Furnari A. Epimenio Foot, MD, [facility] 04/13/2023, 3:34 PM Certified in Neurology, Clinical Neurophysiology, Sleep Medicine and Neuroimaging  [facility] [address] [place], [place] [address] [phone]

## 2023-04-14 DIAGNOSIS — G35 Multiple sclerosis: Secondary | ICD-10-CM | POA: Diagnosis not present

## 2023-04-14 MED ORDER — TOPIRAMATE 50 MG PO TABS: 50.0000 mg | ORAL_TABLET | Freq: Two times a day (BID) | ORAL | 11 refills | Status: DC

## 2023-04-14 NOTE — Addendum Note (Signed)
Addended by: Arther Abbott on: 04/14/2023 07:39 AM   Modules accepted: Orders

## 2023-04-15 NOTE — Telephone Encounter (Signed)
JCV ab drawn on 04/08/23 negative, index: 0.17.

## 2023-04-21 DIAGNOSIS — G35 Multiple sclerosis: Secondary | ICD-10-CM | POA: Diagnosis not present

## 2023-04-25 ENCOUNTER — Ambulatory Visit
Admission: RE | Admit: 2023-04-25 | Discharge: 2023-04-25 | Disposition: A | Discharge status: Home / Self Care | Payer: Medicare Other | Source: Ambulatory Visit | Attending: Neurology | Admitting: Neurology

## 2023-04-25 DIAGNOSIS — M47812 Spondylosis without myelopathy or radiculopathy, cervical region: Secondary | ICD-10-CM | POA: Diagnosis not present

## 2023-04-25 DIAGNOSIS — R269 Unspecified abnormalities of gait and mobility: Secondary | ICD-10-CM | POA: Insufficient documentation

## 2023-04-25 DIAGNOSIS — R208 Other disturbances of skin sensation: Secondary | ICD-10-CM | POA: Insufficient documentation

## 2023-04-25 DIAGNOSIS — G9389 Other specified disorders of brain: Secondary | ICD-10-CM | POA: Diagnosis not present

## 2023-04-25 DIAGNOSIS — G35 Multiple sclerosis: Secondary | ICD-10-CM | POA: Insufficient documentation

## 2023-04-25 DIAGNOSIS — G35D Multiple sclerosis, unspecified: Secondary | ICD-10-CM

## 2023-04-28 ENCOUNTER — Encounter: Payer: Self-pay | Admitting: Psychiatry

## 2023-04-28 ENCOUNTER — Ambulatory Visit: Payer: Medicare Other | Admitting: Psychiatry

## 2023-04-28 VITALS — BP 134/85 | HR 84 | Temp 97.4°F | Ht 63.0 in | Wt 197.2 lb

## 2023-04-28 DIAGNOSIS — G4700 Insomnia, unspecified: Secondary | ICD-10-CM

## 2023-04-28 DIAGNOSIS — G35 Multiple sclerosis: Secondary | ICD-10-CM | POA: Diagnosis not present

## 2023-04-28 DIAGNOSIS — F3341 Major depressive disorder, recurrent, in partial remission: Secondary | ICD-10-CM | POA: Diagnosis not present

## 2023-04-28 DIAGNOSIS — F439 Reaction to severe stress, unspecified: Secondary | ICD-10-CM

## 2023-04-28 MED ORDER — VENLAFAXINE HCL ER 37.5 MG PO CP24
37.5000 mg | ORAL_CAPSULE | ORAL | 0 refills | Status: DC
Start: 2023-04-28 — End: 2023-06-30

## 2023-04-28 MED ORDER — TRAZODONE HCL 50 MG PO TABS
25.0000 mg | ORAL_TABLET | Freq: Every evening | ORAL | 1 refills | Status: DC | PRN
Start: 2023-04-28 — End: 2024-09-13

## 2023-04-28 NOTE — Progress Notes (Signed)
BH MD  OP Progress Note  04/28/2023 5:24 PM Rose Clements  MRN:  [id number]  Chief Complaint:  Chief Complaint  Patient presents with   Follow-up   Anxiety   Depression   Medication Refill   HPI: Rose Clements is a 62 year old African-American female who is married, on SSD, lives in [place], has a history of MDD, multiple sclerosis on intrathecal baclofen pump, migraine headaches, hypertension was evaluated in office today.  Patient today reports she is currently doing better with regards to her mood.  She however continues to struggle with a lot of pain.  Her MS is currently progressing and that is also giving her a lot of physical symptoms.  Patient reports there was a day recently when she was worried about not waking up from her sleep.  Patient however reports she has struggled with that for several years now.  She denies having any suicidality.  She does struggle with headaches which are getting worse since the past several weeks.  Patient reports she also has not been sleeping well especially due to her pain.  She does have symptoms of her hands going numb when she sleeps in certain position.  That also causes sleep issues.  Patient is currently not on sleep medication.  Agreeable to a trial of trazodone.  Patient is currently on a lower dosage of venlafaxine, tolerating it well.  Agreeable to dosage reduction and further tapering.  Patient currently compliant on duloxetine.  Denies side effects.  Patient denies any suicidality, homicidality or perceptual disturbances.  Patient is currently compliant with psychotherapy sessions.  Reports she had an appointment with Ms. Felecia Jan yesterday.  She continues to be motivated to stay in therapy and has upcoming appointment beginning of June.  Patient does struggle with diarrhea.  She reports she has had episodes of diarrhea in the past and her providers could not figure out what could have contributed to it.  She reports due to that she  has not been eating much.  She agrees to contact her primary care provider.  Visit Diagnosis:    ICD-10-CM   1. MDD (major depressive disorder), recurrent, in partial remission (HCC)  F33.41     2. Trauma and stressor-related disorder  F43.9 venlafaxine XR (EFFEXOR XR) 37.5 MG 24 hr capsule    traZODone (DESYREL) 50 MG tablet   Unspecified R/O PTSD    3. Insomnia, unspecified type  G47.00 traZODone (DESYREL) 50 MG tablet      Past Psychiatric History: I have reviewed past psychiatric history from progress note on 03/18/2023.  Past inpatient admission-at [facility]-01/07/2023 - 01/10/2023-after a suicide attempt.  Past trials of medications-multiple.  Past Medical History:  Past Medical History:  Diagnosis Date   Constipation    Cystitis bacillary, chronic    Depression    Dysphagia    Dx by Beverely Risen on 10/15/14   Eosinophilic esophagitis 11/19/2014   GE junction benign stricture s/p balloon dilation, retained food contents. ?gastroparesis versus secondary to EE.   Esophageal dysphagia    GERD (gastroesophageal reflux disease)    Heartburn    Hypertension    Mental depression 2024   Migraine headache    "couple times each month"   Multilevel degenerative disc disease    Multiple sclerosis (HCC) 07/16/2015   Port-A-Cath in place    right side   Presence of intrathecal baclofen pump    right abdomen   Vertigo    Rare - none in last 6 months  Past Surgical History:  Procedure Laterality Date   ABDOMINAL HYSTERECTOMY  2003   baclofen pump insertion Right 01/04/2018   CARPAL TUNNEL RELEASE Right 1997   CESAREAN SECTION  1989   COLONOSCOPY WITH PROPOFOL N/A 10/28/2020   Procedure: COLONOSCOPY WITH PROPOFOL;  Surgeon: [name], MD;  Location: [facility];  Service: Endoscopy;  Laterality: N/A;  PRIORITY 4 port a cath   CYSTECTOMY  2002   From chest   ESOPHAGOGASTRODUODENOSCOPY (EGD) WITH PROPOFOL N/A 05/19/2016   Procedure: ESOPHAGOGASTRODUODENOSCOPY (EGD) WITH  PROPOFOL;  Surgeon: [name], MD;  Location: [facility];  Service: Endoscopy;  Laterality: N/A;   IR IMAGING GUIDED PORT INSERTION  06/24/2020   KIDNEY DONATION Left 2002   OVARIAN CYST REMOVAL Left 2002   Ovarian mass removal   TONSILLECTOMY  1973   UPPER GASTROINTESTINAL ENDOSCOPY  11/19/2014   Benign appearing esophageal stricture-Dilated.    Family Psychiatric History: I have reviewed family psychiatric history from progress note on 03/18/2023.  Family History:  Family History  Problem Relation Age of Onset   Diabetes Mother    Hypertension Mother    Heart disease Mother    Pulmonary embolism Mother    Diabetes Sister    Hypertension Sister    Heart disease Sister    Bipolar disorder Sister    Narcolepsy Brother    Dementia Maternal Aunt    Breast cancer Maternal Aunt    Diabetes Maternal Grandmother    Heart attack Maternal Grandmother    Diabetes Maternal Grandfather    Pancreatic cancer Maternal Grandfather     Social History: I have reviewed social history from progress note on 03/18/2023. Social History   Socioeconomic History   Marital status: Married    Spouse name: [name]   Number of children: 1   Years of education: Not on file   Highest education level: Some college, no degree  Occupational History   Occupation: Disability  Tobacco Use   Smoking status: Never   Smokeless tobacco: Never  Vaping Use   Vaping Use: Never used  Substance and Sexual Activity   Alcohol use: No    Alcohol/week: 0.0 standard drinks of alcohol    Comment: rare wine on Holidays   Drug use: No   Sexual activity: Not Currently  Other Topics Concern   Not on file  Social History Narrative   Lives with husband, Taylre Raison   Right handed    Caffeine use: tea sometimes (mostly herbal)   Social Determinants of Health   Financial Resource Strain: Low Risk  (05/15/2021)   Overall Financial Resource Strain (CARDIA)    Difficulty of Paying Living Expenses: Not very hard   Food Insecurity: No Food Insecurity (01/06/2023)   Hunger Vital Sign    Worried About Running Out of Food in the Last Year: Never true    Ran Out of Food in the Last Year: Never true  Transportation Needs: No Transportation Needs (01/06/2023)   PRAPARE - Administrator, Civil Service (Medical): No    Lack of Transportation (Non-Medical): No  Physical Activity: Not on file  Stress: Not on file  Social Connections: Not on file    Allergies:  Allergies  Allergen Reactions   Lisinopril Hives    Metabolic Disorder Labs: No results found for: "HGBA1C", "MPG" No results found for: "PROLACTIN" Lab Results  Component Value Date   CHOL 267 (H) 03/31/2022   TRIG 61 03/31/2022   HDL 80 03/31/2022   CHOLHDL 3.3  03/31/2022   LDLCALC 178 (H) 03/31/2022   LDLCALC 144 (H) 02/07/2020   Lab Results  Component Value Date   TSH 2.320 03/31/2022   TSH 1.350 02/07/2020    Therapeutic Level Labs: No results found for: "LITHIUM" No results found for: "VALPROATE" No results found for: "CBMZ"  Current Medications: Current Outpatient Medications  Medication Sig Dispense Refill   baclofen (LIORESAL) 10 MG tablet Take 1 tablet (10 mg total) by mouth 3 (three) times daily as needed for muscle spasms. 270 each 1   cetirizine (ZYRTEC) 10 MG tablet Take 10 mg by mouth daily.     Cholecalciferol (VITAMIN D3) 1000 UNITS CAPS Take 1,000 Units by mouth in the morning and at bedtime.      cyanocobalamin (VITAMIN B12) 1000 MCG tablet Take 1,000 mcg by mouth daily.     desmopressin (DDAVP) 0.1 MG tablet Take 1 tablet (0.1 mg total) by mouth daily. 90 tablet 3   DULoxetine (CYMBALTA) 20 MG capsule Take 1 capsule (20 mg total) by mouth in the morning. Take daily with 60 mg daily - total of 80 mg daily 90 capsule 0   DULoxetine (CYMBALTA) 60 MG capsule Take 1 capsule (60 mg total) by mouth daily after supper. Take daily along with 20 mg , total of 80 mg daily 90 capsule 0   fluticasone (FLONASE)  50 MCG/ACT nasal spray SPRAY 2 SPRAYS INTO EACH NOSTRIL EVERY DAY 48 mL 2   hydrOXYzine (ATARAX) 25 MG tablet Take 1 tablet (25 mg total) by mouth 3 (three) times daily as needed for anxiety. 30 tablet 1   natalizumab (TYSABRI) 300 MG/15ML injection Inject 300 mg into the vein every 30 (thirty) days.      pantoprazole (PROTONIX) 40 MG tablet Take 1 tablet (40 mg total) by mouth daily. 90 tablet 1   polyethylene glycol (MIRALAX / GLYCOLAX) 17 g packet Take 17 g by mouth daily. 28 each 1   rizatriptan (MAXALT-MLT) 10 MG disintegrating tablet Take 1 tablet (10 mg total) by mouth as needed for migraine. May repeat in 2 hours if needed 9 tablet 1   topiramate (TOPAMAX) 50 MG tablet Take 1 tablet (50 mg total) by mouth 2 (two) times daily. 60 tablet 11   traZODone (DESYREL) 50 MG tablet Take 0.5-1 tablets (25-50 mg total) by mouth at bedtime as needed for sleep. 30 tablet 1   venlafaxine XR (EFFEXOR XR) 37.5 MG 24 hr capsule Take 1 capsule (37.5 mg total) by mouth as directed. Take every other day for 2 weeks and then take every two days until you are done with your medication. 30 capsule 0   No current facility-administered medications for this visit.     Musculoskeletal: Strength & Muscle Tone: within normal limits Gait & Station:  walks with walker Patient leans: Front  Psychiatric Specialty Exam: Review of Systems  Constitutional:  Positive for fatigue.  Musculoskeletal:  Positive for myalgias.  Neurological:  Positive for headaches.  Psychiatric/Behavioral:  Positive for sleep disturbance.     Blood pressure 134/85, pulse 84, temperature (!) 97.4 F (36.3 C), temperature source Skin, height 5\' 3"  (1.6 m), weight 197 lb 3.2 oz (89.4 kg).Body mass index is 34.93 kg/m.  General Appearance: Casual  Eye Contact:  Fair  Speech:  Clear and Coherent  Volume:  Normal  Mood:  Euthymic  Affect:  Congruent  Thought Process:  Goal Directed and Descriptions of Associations: Intact  Orientation:   Full (Time, Place, and Person)  Thought Content:  Logical   Suicidal Thoughts:  No  Homicidal Thoughts:  No  Memory:  Immediate;   Fair Recent;   Fair Remote;   Fair  Judgement:  Fair  Insight:  Fair  Psychomotor Activity:  Normal  Concentration:  Concentration: Fair and Attention Span: Fair  Recall:  [employer] of Knowledge: Fair  Language: Fair  Akathisia:  No  Handed:  Right  AIMS (if indicated): not done  Assets:  Communication Skills Desire for Improvement Housing Social Support  ADL's:  Intact  Cognition: WNL  Sleep:  Poor   Screenings: AUDIT    Flowsheet Row Admission (Discharged) from 01/06/2023 in [facility]ICINE  Alcohol Use Disorder Identification Test Final Score (AUDIT) 0      GAD-7    Flowsheet Row Office Visit from 04/28/2023 in [facility] Office Visit from 03/18/2023 in [facility]  Total GAD-7 Score 9 10      Mini-Mental    Flowsheet Row Office Visit from 04/01/2023 in [facility], [facility] Office Visit from 03/26/2022 in [facility], [facility] Clinical Support from 03/03/2021 in [facility], [facility]  Total Score (max 30 points ) 30 30 30       PHQ2-9    Flowsheet Row Office Visit from 04/28/2023 in [facility] Office Visit from 03/18/2023 in [facility] Office Visit from 08/24/2022 in [facility], [facility] Office Visit from 03/26/2022 in [facility], [facility] Clinical Support from 03/03/2021 in [facility], [facility]  PHQ-2 Total Score 1 4 6 6 1   PHQ-9 Total Score 9 20 22 20  --      Flowsheet Row Office Visit from 04/28/2023 in [facility] Office Visit from 03/18/2023 in [facility] Admission (Discharged) from 01/06/2023 in [facility] INPATIENT BEHAVIORAL  MEDICINE  C-SSRS RISK CATEGORY Moderate Risk Low Risk No Risk        Assessment and Plan: Cinthia Selmer is a 62 year old African-American female with recent inpatient behavioral health admission at [facility], currently struggling with multiple sclerosis, progressing with multiple physical symptoms including GI problems-sleep problems although depression symptoms otherwise improving.  Plan as noted below.  Plan MDD in partial remission Duloxetine 80 mg p.o. daily divided dosage Taper off venlafaxine further, patient advised to take venlafaxine 37.5 mg p.o. every other day for 2 weeks and then take every 2 days and stop taking it once she completes her current prescription bottle. Continue CBT with Ms. Felecia Jan.  I have coordinated care with Ms. Janee Morn.  Trauma related disorder unspecified-rule out PTSD-improving Continue CBT-patient to continue psychotherapy with Ms. Felecia Jan. Continue duloxetine as prescribed  Insomnia unspecified-likely multifactorial including headache, pain-unstable Patient will need sufficient pain management. Trazodone 25-50 mg p.o. nightly as needed  Follow-up in clinic in 4 to 6 weeks or sooner if needed.  Collaboration of Care: Collaboration of Care: Referral or follow-up with counselor/therapist AEB patient encouraged to continue psychotherapy sessions.  Patient encouraged to follow up with primary provider for her GI problems.   Patient/Guardian was advised Release of Information must be obtained prior to any record release in order to collaborate their care with an outside provider. Patient/Guardian was advised if they have not already done so to contact the registration department to sign all necessary forms in order for [place] to release information regarding their care.   Consent: Patient/Guardian gives verbal consent for treatment  and assignment of benefits for services provided during this visit. Patient/Guardian expressed understanding and agreed to  proceed.   This note was generated in part or whole with voice recognition software. Voice recognition is usually quite accurate but there are transcription errors that can and very often do occur. I apologize for any typographical errors that were not detected and corrected.     Jomarie Longs, MD 04/28/2023, 5:24 PM

## 2023-04-28 NOTE — Patient Instructions (Signed)
Trazodone Tablets What is this medication? TRAZODONE (TRAZ oh done) treats depression. It increases the amount of serotonin in the brain, a hormone that helps regulate mood. This medicine may be used for other purposes; ask your health care provider or pharmacist if you have questions. COMMON BRAND NAME(S): Desyrel What should I tell my care team before I take this medication? They need to know if you have any of these conditions: Attempted suicide or thinking about it Bipolar disorder Bleeding problems Glaucoma Heart disease, or previous heart attack Irregular heart beat Kidney or liver disease Low levels of sodium in the blood An unusual or allergic reaction to trazodone, other medications, foods, dyes or preservatives Pregnant or trying to get pregnant Breast-feeding How should I use this medication? Take this medication by mouth with a glass of water. Follow the directions on the prescription label. Take this medication shortly after a meal or a light snack. Take your medication at regular intervals. Do not take your medication more often than directed. Do not stop taking this medication suddenly except upon the advice of your care team. Stopping this medication too quickly may cause serious side effects or your condition may worsen. A special MedGuide will be given to you by the pharmacist with each prescription and refill. Be sure to read this information carefully each time. Talk to your care team regarding the use of this medication in children. Special care may be needed. Overdosage: If you think you have taken too much of this medicine contact a poison control center or emergency room at once. NOTE: This medicine is only for you. Do not share this medicine with others. What if I miss a dose? If you miss a dose, take it as soon as you can. If it is almost time for your next dose, take only that dose. Do not take double or extra doses. What may interact with this medication? Do not  take this medication with any of the following: Certain medications for fungal infections like fluconazole, itraconazole, ketoconazole, posaconazole, voriconazole Cisapride Dronedarone Linezolid MAOIs like Carbex, Eldepryl, Marplan, Nardil, and Parnate Mesoridazine Methylene blue (injected into a vein) Pimozide Saquinavir Thioridazine This medication may also interact with the following: Alcohol Antiviral medications for HIV or AIDS Aspirin and aspirin-like medications Barbiturates like phenobarbital Certain medications for blood pressure, heart disease, irregular heart beat Certain medications for depression, anxiety, or psychotic disturbances Certain medications for migraine headache like almotriptan, eletriptan, frovatriptan, naratriptan, rizatriptan, sumatriptan, zolmitriptan Certain medications for seizures like carbamazepine and phenytoin Certain medications for sleep Certain medications that treat or prevent blood clots like dalteparin, enoxaparin, warfarin Digoxin Fentanyl Lithium NSAIDS, medications for pain and inflammation, like ibuprofen or naproxen Other medications that prolong the QT interval (cause an abnormal heart rhythm) like dofetilide Rasagiline Supplements like St. John's wort, kava kava, valerian Tramadol Tryptophan This list may not describe all possible interactions. Give your health care provider a list of all the medicines, herbs, non-prescription drugs, or dietary supplements you use. Also tell them if you smoke, drink alcohol, or use illegal drugs. Some items may interact with your medicine. What should I watch for while using this medication? Tell your care team if your symptoms do not get better or if they get worse. Visit your care team for regular checks on your progress. Because it may take several weeks to see the full effects of this medication, it is important to continue your treatment as prescribed by your care team. Watch for new or worsening  thoughts of   suicide or depression. This includes sudden changes in mood, behaviors, or thoughts. These changes can happen at any time but are more common in the beginning of treatment or after a change in dose. Call your care team right away if you experience these thoughts or worsening depression. Manic episodes may happen in patients with bipolar disorder who take this medication. Watch for changes in feelings or behaviors such as feeling anxious, nervous, agitated, panicky, irritable, hostile, aggressive, impulsive, severely restless, overly excited and hyperactive, or trouble sleeping. These changes can happen at any time but are more common in the beginning of treatment or after a change in dose. Call your care team right away if you notice any of these symptoms. You may get drowsy or dizzy. Do not drive, use machinery, or do anything that needs mental alertness until you know how this medication affects you. Do not stand or sit up quickly, especially if you are an older patient. This reduces the risk of dizzy or fainting spells. Alcohol may interfere with the effect of this medication. Avoid alcoholic drinks. This medication may cause dry eyes and blurred vision. If you wear contact lenses you may feel some discomfort. Lubricating drops may help. See your eye doctor if the problem does not go away or is severe. Your mouth may get dry. Chewing sugarless gum, sucking hard candy and drinking plenty of water may help. Contact your care team if the problem does not go away or is severe. What side effects may I notice from receiving this medication? Side effects that you should report to your care team as soon as possible: Allergic reactions--skin rash, itching, hives, swelling of the face, lips, tongue, or throat Bleeding--bloody or black, tar-like stools, red or dark brown urine, vomiting blood or brown material that looks like coffee grounds, small, red or purple spots on skin, unusual bleeding or  bruising Heart rhythm changes--fast or irregular heartbeat, dizziness, feeling faint or lightheaded, chest pain, trouble breathing Low blood pressure--dizziness, feeling faint or lightheaded, blurry vision Low sodium level--muscle weakness, fatigue, dizziness, headache, confusion Prolonged or painful erection Serotonin syndrome--irritability, confusion, fast or irregular heartbeat, muscle stiffness, twitching muscles, sweating, high fever, seizures, chills, vomiting, diarrhea Sudden eye pain or change in vision such as blurry vision, seeing halos around lights, vision loss Thoughts of suicide or self-harm, worsening mood, feelings of depression Side effects that usually do not require medical attention (report to your care team if they continue or are bothersome): Change in sex drive or performance Constipation Dizziness Drowsiness Dry mouth This list may not describe all possible side effects. Call your doctor for medical advice about side effects. You may report side effects to FDA at 1-800-FDA-1088. Where should I keep my medication? Keep out of the reach of children and pets. Store at room temperature between 15 and 30 degrees C (59 to 86 degrees F). Protect from light. Keep container tightly closed. Throw away any unused medication after the expiration date. NOTE: This sheet is a summary. It may not cover all possible information. If you have questions about this medicine, talk to your doctor, pharmacist, or health care provider.  2023 Elsevier/Gold Standard (2008-01-14 00:00:00)  

## 2023-05-05 DIAGNOSIS — G35 Multiple sclerosis: Secondary | ICD-10-CM | POA: Diagnosis not present

## 2023-05-06 DIAGNOSIS — G35 Multiple sclerosis: Secondary | ICD-10-CM | POA: Diagnosis not present

## 2023-05-08 DIAGNOSIS — G35 Multiple sclerosis: Secondary | ICD-10-CM | POA: Diagnosis not present

## 2023-05-15 DIAGNOSIS — G35 Multiple sclerosis: Secondary | ICD-10-CM | POA: Diagnosis not present

## 2023-05-22 DIAGNOSIS — G35 Multiple sclerosis: Secondary | ICD-10-CM | POA: Diagnosis not present

## 2023-05-29 DIAGNOSIS — G35 Multiple sclerosis: Secondary | ICD-10-CM | POA: Diagnosis not present

## 2023-06-03 DIAGNOSIS — G35 Multiple sclerosis: Secondary | ICD-10-CM | POA: Diagnosis not present

## 2023-06-05 DIAGNOSIS — G35 Multiple sclerosis: Secondary | ICD-10-CM | POA: Diagnosis not present

## 2023-06-07 DIAGNOSIS — G35 Multiple sclerosis: Secondary | ICD-10-CM | POA: Diagnosis not present

## 2023-06-12 ENCOUNTER — Other Ambulatory Visit: Payer: Self-pay | Admitting: Psychiatry

## 2023-06-12 DIAGNOSIS — F332 Major depressive disorder, recurrent severe without psychotic features: Secondary | ICD-10-CM

## 2023-06-14 DIAGNOSIS — G35 Multiple sclerosis: Secondary | ICD-10-CM | POA: Diagnosis not present

## 2023-06-21 DIAGNOSIS — G35 Multiple sclerosis: Secondary | ICD-10-CM | POA: Diagnosis not present

## 2023-06-22 ENCOUNTER — Telehealth: Payer: Self-pay | Admitting: *Deleted

## 2023-06-22 NOTE — Telephone Encounter (Signed)
  Fax confirmation received. [phone].

## 2023-06-28 DIAGNOSIS — G35 Multiple sclerosis: Secondary | ICD-10-CM | POA: Diagnosis not present

## 2023-06-29 ENCOUNTER — Ambulatory Visit: Payer: Medicare Other | Admitting: Neurology

## 2023-06-30 ENCOUNTER — Ambulatory Visit: Payer: Medicare Other | Admitting: Psychiatry

## 2023-06-30 ENCOUNTER — Encounter: Payer: Self-pay | Admitting: Psychiatry

## 2023-06-30 VITALS — BP 126/85 | HR 72 | Temp 98.0°F | Ht 63.0 in | Wt 198.6 lb

## 2023-06-30 DIAGNOSIS — G4701 Insomnia due to medical condition: Secondary | ICD-10-CM | POA: Diagnosis not present

## 2023-06-30 DIAGNOSIS — F3341 Major depressive disorder, recurrent, in partial remission: Secondary | ICD-10-CM | POA: Diagnosis not present

## 2023-06-30 DIAGNOSIS — Z630 Problems in relationship with spouse or partner: Secondary | ICD-10-CM

## 2023-06-30 MED ORDER — BUPROPION HCL 75 MG PO TABS
75.0000 mg | ORAL_TABLET | Freq: Every morning | ORAL | 1 refills | Status: DC
Start: 2023-06-30 — End: 2023-08-25

## 2023-06-30 MED ORDER — DULOXETINE HCL 60 MG PO CPEP
60.0000 mg | ORAL_CAPSULE | Freq: Every day | ORAL | 0 refills | Status: DC
Start: 2023-06-30 — End: 2023-10-29

## 2023-06-30 NOTE — Patient Instructions (Signed)
Bupropion Tablets (Depression/Mood Disorders) What is this medication? BUPROPION (byoo PROE pee on) treats depression. It increases norepinephrine and dopamine in the brain, hormones that help regulate mood. It belongs to a group of medications called NDRIs. This medicine may be used for other purposes; ask your health care provider or pharmacist if you have questions. COMMON BRAND NAME(S): Wellbutrin What should I tell my care team before I take this medication? They need to know if you have any of these conditions: An eating disorder, such as anorexia or bulimia Bipolar disorder or psychosis Diabetes or high blood sugar, treated with medication Glaucoma Head injury or brain tumor Heart disease, previous heart attack, or irregular heart beat High blood pressure Kidney disease Liver disease Seizures Suicidal thoughts, plans, or attempt by you or a family member Tourette syndrome Weight loss An unusual or allergic reaction to bupropion, other medications, foods, dyes, or preservatives Pregnant or trying to become pregnant Breastfeeding How should I use this medication? Take this medication by mouth with a glass of water. Follow the directions on the prescription label. You can take it with or without food. If it upsets your stomach, take it with food. Take your medication at regular intervals. Do not take your medication more often than directed. Do not stop taking this medication suddenly except upon the advice of your care team. Stopping this medication too quickly may cause serious side effects or your condition may worsen. A special MedGuide will be given to you by the pharmacist with each prescription and refill. Be sure to read this information carefully each time. Talk to your care team regarding the use of this medication in children. Special care may be needed. Overdosage: If you think you have taken too much of this medicine contact a poison control center or emergency room at  once. NOTE: This medicine is only for you. Do not share this medicine with others. What if I miss a dose? If you miss a dose, take it as soon as you can. If it is less than four hours to your next dose, take only that dose and skip the missed dose. Do not take double or extra doses. What may interact with this medication? Do not take this medication with any of the following: Linezolid MAOIs, such as Azilect, Carbex, Eldepryl, Marplan, Nardil, and Parnate Methylene blue (injected into a vein) Other medications that contain bupropion, such as Zyban This medication may also interact with the following: Alcohol Certain medications for anxiety or sleep Certain medications for blood pressure, such as metoprolol, propranolol Certain medications for HIV or AIDS, such as efavirenz, lopinavir, nelfinavir, ritonavir Certain medications for irregular heartbeat, such as propafenone, flecainide Certain medications for mental health conditions Certain medications for Parkinson disease, such as amantadine, levodopa Certain medications for seizures, such as carbamazepine, phenytoin, phenobarbital Cimetidine Clopidogrel Cyclophosphamide Digoxin Furazolidone Isoniazid Nicotine Orphenadrine Procarbazine Steroid medications, such as prednisone or cortisone Stimulant medications for attention disorders, weight loss, or to stay awake Tamoxifen Theophylline Thiotepa Ticlopidine Tramadol Warfarin This list may not describe all possible interactions. Give your health care provider a list of all the medicines, herbs, non-prescription drugs, or dietary supplements you use. Also tell them if you smoke, drink alcohol, or use illegal drugs. Some items may interact with your medicine. What should I watch for while using this medication? Tell your care team if your symptoms do not get better or if they get worse. Visit your care team for regular checks on your progress. Because it may take several  weeks to see  the full effects of this medication, it is important to continue your treatment as prescribed. This medication may cause thoughts of suicide or depression. This includes sudden changes in mood, behaviors, or thoughts. These changes can happen at any time but are more common in the beginning of treatment or after a change in dose. Call your care team right away if you experience these thoughts or worsening depression. This medication may cause mood and behavior changes, such as anxiety, nervousness, irritability, hostility, restlessness, excitability, hyperactivity, or trouble sleeping. These changes can happen at any time but are more common in the beginning of treatment or after a change in dose. Call your care team right away if you notice any of these symptoms. This medication may cause serious skin reactions. They can happen weeks to months after starting the medication. Contact your care team right away if you notice fevers or flu-like symptoms with a rash. The rash may be red or purple and then turn into blisters or peeling of the skin. You may also notice a red rash with swelling of the face, lips, or lymph nodes in your neck or under your arms. Avoid drinks that contain alcohol while taking this medication. Drinking large amounts of alcohol, using sleeping or anxiety medications, or quickly stopping the use of these agents while taking this medication may increase your risk for a seizure. This medication may affect your coordination, reaction time, or judgment. Do not drive or operate machinery until you know how this medication affects you. Do not take this medication close to bedtime. It may prevent you from sleeping. Your mouth may get dry. Chewing sugarless gum or sucking hard candy and drinking plenty of water may help. Contact your care team if the problem does not go away or is severe. What side effects may I notice from receiving this medication? Side effects that you should report to your  care team as soon as possible: Allergic reactions--skin rash, itching, hives, swelling of the face, lips, tongue, or throat Increase in blood pressure Mood and behavior changes--anxiety, nervousness, confusion, hallucinations, irritability, hostility, thoughts of suicide or self-harm, worsening mood, feelings of depression Redness, blistering, peeling, or loosening of the skin, including inside the mouth Seizures Sudden eye pain or change in vision such as blurry vision, seeing halos around lights, vision loss Side effects that usually do not require medical attention (report to your care team if they continue or are bothersome): Constipation Dizziness Dry mouth Loss of appetite Nausea Tremors or shaking Trouble sleeping This list may not describe all possible side effects. Call your doctor for medical advice about side effects. You may report side effects to FDA at 1-800-FDA-1088. Where should I keep my medication? Keep out of the reach of children and pets. Store at room temperature between 20 and 25 degrees C (68 and 77 degrees F), away from direct sunlight and moisture. Keep tightly closed. Throw away any unused medication after the expiration date. NOTE: This sheet is a summary. It may not cover all possible information. If you have questions about this medicine, talk to your doctor, pharmacist, or health care provider.  2024 Elsevier/Gold Standard (2022-08-16 00:00:00)

## 2023-06-30 NOTE — Progress Notes (Unsigned)
BH MD OP Progress Note  06/30/2023 5:28 PM Rose Clements  MRN:  [id number]  Chief Complaint:  Chief Complaint  Patient presents with   Follow-up   Depression   Anxiety   Insomnia   Medication Refill   HPI: Rose Clements is a 62 year old African-American female who is married, on SSD, lives in [place], has a history of MDD, multiple sclerosis on intrathecal baclofen pump, migraine headaches, hypertension was evaluated in office today.  Patient today reports she is currently struggling with excessive sleepiness during the day.  She does have an most likely contributing to chronic fatigue as well as sleepiness.  She hence does not sleep well at night.  She reports she continues to also struggle with relationship with her spouse.  That does affect her mood.  She reports she often feels sad that he does not respond to her emotionally when she needs support.  That does worry her.  She hence does have some sadness, low motivation.  She is trying to get out more though.  She has joined a Bible study group as well as goes to [employer].  Recently made some comments as to maybe she needs to do something to hurt herself to get her husband's attention.  She however reports she did not mean that and currently denies any suicidality.  Patient denies any homicidality or perceptual disturbances.  Patient was able to taper herself off the venlafaxine, denies withdrawal symptoms.  Currently compliant on the duloxetine, denies side effects.  Patient is currently compliant with her therapist, reports therapy sessions are beneficial.  She would like her husband to come in further sessions however he has not been motivated to do so.  Patient denies any other concerns today.  Visit Diagnosis:    ICD-10-CM   1. MDD (major depressive disorder), recurrent, in partial remission (HCC)  F33.41 buPROPion (WELLBUTRIN) 75 MG tablet    2. Insomnia due to medical condition  G47.01    depression, MS    3. Partner  relational problem  Z63.0 DULoxetine (CYMBALTA) 60 MG capsule      Past Psychiatric History: I have reviewed past psychiatric history from progress note on 03/18/2023.  Past inpatient admission at [facility]-01/07/2023 - 01/10/2023-after a suicide attempt.  Past trials of medications-multiple.  Past Medical History:  Past Medical History:  Diagnosis Date   Constipation    Cystitis bacillary, chronic    Depression    Dysphagia    Dx by Beverely Risen on 10/15/14   Eosinophilic esophagitis 11/19/2014   GE junction benign stricture s/p balloon dilation, retained food contents. ?gastroparesis versus secondary to EE.   Esophageal dysphagia    GERD (gastroesophageal reflux disease)    Heartburn    Hypertension    Mental depression 2024   Migraine headache    "couple times each month"   Multilevel degenerative disc disease    Multiple sclerosis (HCC) 07/16/2015   Port-A-Cath in place    right side   Presence of intrathecal baclofen pump    right abdomen   Vertigo    Rare - none in last 6 months    Past Surgical History:  Procedure Laterality Date   ABDOMINAL HYSTERECTOMY  2003   baclofen pump insertion Right 01/04/2018   CARPAL TUNNEL RELEASE Right 1997   CESAREAN SECTION  1989   COLONOSCOPY WITH PROPOFOL N/A 10/28/2020   Procedure: COLONOSCOPY WITH PROPOFOL;  Surgeon: [name], MD;  Location: [facility];  Service: Endoscopy;  Laterality: N/A;  PRIORITY 4  port a cath   CYSTECTOMY  2002   From chest   ESOPHAGOGASTRODUODENOSCOPY (EGD) WITH PROPOFOL N/A 05/19/2016   Procedure: ESOPHAGOGASTRODUODENOSCOPY (EGD) WITH PROPOFOL;  Surgeon: [name], MD;  Location: [facility];  Service: Endoscopy;  Laterality: N/A;   IR IMAGING GUIDED PORT INSERTION  06/24/2020   KIDNEY DONATION Left 2002   OVARIAN CYST REMOVAL Left 2002   Ovarian mass removal   TONSILLECTOMY  1973   UPPER GASTROINTESTINAL ENDOSCOPY  11/19/2014   Benign appearing esophageal stricture-Dilated.    Family  Psychiatric History: I have reviewed family psychiatric history from progress note on 03/18/2023.  Family History:  Family History  Problem Relation Age of Onset   Diabetes Mother    Hypertension Mother    Heart disease Mother    Pulmonary embolism Mother    Diabetes Sister    Hypertension Sister    Heart disease Sister    Bipolar disorder Sister    Narcolepsy Brother    Dementia Maternal Aunt    Breast cancer Maternal Aunt    Diabetes Maternal Grandmother    Heart attack Maternal Grandmother    Diabetes Maternal Grandfather    Pancreatic cancer Maternal Grandfather     Social History: I have reviewed social history from progress note on 03/18/2023. Social History   Socioeconomic History   Marital status: Married    Spouse name: [name]   Number of children: 1   Years of education: Not on file   Highest education level: Some college, no degree  Occupational History   Occupation: Disability  Tobacco Use   Smoking status: Never   Smokeless tobacco: Never  Vaping Use   Vaping status: Never Used  Substance and Sexual Activity   Alcohol use: No    Alcohol/week: 0.0 standard drinks of alcohol    Comment: rare wine on Holidays   Drug use: No   Sexual activity: Not Currently  Other Topics Concern   Not on file  Social History Narrative   Lives with husband, Rose Clements   Right handed    Caffeine use: tea sometimes (mostly herbal)   Social Determinants of Health   Financial Resource Strain: Low Risk  (05/15/2021)   Overall Financial Resource Strain (CARDIA)    Difficulty of Paying Living Expenses: Not very hard  Food Insecurity: No Food Insecurity (01/06/2023)   Hunger Vital Sign    Worried About Running Out of Food in the Last Year: Never true    Ran Out of Food in the Last Year: Never true  Transportation Needs: No Transportation Needs (01/06/2023)   PRAPARE - Administrator, Civil Service (Medical): No    Lack of Transportation (Non-Medical): No   Physical Activity: Not on file  Stress: Not on file  Social Connections: Not on file    Allergies:  Allergies  Allergen Reactions   Lisinopril Hives    Metabolic Disorder Labs: No results found for: "HGBA1C", "MPG" No results found for: "PROLACTIN" Lab Results  Component Value Date   CHOL 267 (H) 03/31/2022   TRIG 61 03/31/2022   HDL 80 03/31/2022   CHOLHDL 3.3 03/31/2022   LDLCALC 178 (H) 03/31/2022   LDLCALC 144 (H) 02/07/2020   Lab Results  Component Value Date   TSH 2.320 03/31/2022   TSH 1.350 02/07/2020    Therapeutic Level Labs: No results found for: "LITHIUM" No results found for: "VALPROATE" No results found for: "CBMZ"  Current Medications: Current Outpatient Medications  Medication Sig Dispense Refill  baclofen (LIORESAL) 10 MG tablet Take 1 tablet (10 mg total) by mouth 3 (three) times daily as needed for muscle spasms. 270 each 1   buPROPion (WELLBUTRIN) 75 MG tablet Take 1 tablet (75 mg total) by mouth in the morning. 30 tablet 1   cetirizine (ZYRTEC) 10 MG tablet Take 10 mg by mouth daily.     Cholecalciferol (VITAMIN D3) 1000 UNITS CAPS Take 1,000 Units by mouth in the morning and at bedtime.      cyanocobalamin (VITAMIN B12) 1000 MCG tablet Take 1,000 mcg by mouth daily.     desmopressin (DDAVP) 0.1 MG tablet Take 1 tablet (0.1 mg total) by mouth daily. 90 tablet 3   DULoxetine (CYMBALTA) 20 MG capsule TAKE 1 CAPSULE(20 MG) BY MOUTH IN THE MORNING( TAKE WITH 60 MG CAPSULE FOR TOTAL DAILY DOSE OF 80 MG) 90 capsule 0   fluticasone (FLONASE) 50 MCG/ACT nasal spray SPRAY 2 SPRAYS INTO EACH NOSTRIL EVERY DAY 48 mL 2   hydrOXYzine (ATARAX) 25 MG tablet Take 1 tablet (25 mg total) by mouth 3 (three) times daily as needed for anxiety. 30 tablet 1   natalizumab (TYSABRI) 300 MG/15ML injection Inject 300 mg into the vein every 30 (thirty) days.      pantoprazole (PROTONIX) 40 MG tablet Take 1 tablet (40 mg total) by mouth daily. 90 tablet 1   polyethylene  glycol (MIRALAX / GLYCOLAX) 17 g packet Take 17 g by mouth daily. 28 each 1   rizatriptan (MAXALT-MLT) 10 MG disintegrating tablet Take 1 tablet (10 mg total) by mouth as needed for migraine. May repeat in 2 hours if needed 9 tablet 1   topiramate (TOPAMAX) 50 MG tablet Take 1 tablet (50 mg total) by mouth 2 (two) times daily. 60 tablet 11   traZODone (DESYREL) 50 MG tablet Take 0.5-1 tablets (25-50 mg total) by mouth at bedtime as needed for sleep. 30 tablet 1   DULoxetine (CYMBALTA) 60 MG capsule Take 1 capsule (60 mg total) by mouth daily after supper. Take daily along with 20 mg , total of 80 mg daily 90 capsule 0   No current facility-administered medications for this visit.     Musculoskeletal: Strength & Muscle Tone: within normal limits Gait & Station:  walks with walker Patient leans: Front  Psychiatric Specialty Exam: Review of Systems  Psychiatric/Behavioral:  Positive for decreased concentration, dysphoric mood and sleep disturbance. The patient is nervous/anxious.     Blood pressure 126/85, pulse 72, temperature 98 F (36.7 C), temperature source Skin, height 5\' 3"  (1.6 m), weight 198 lb 9.6 oz (90.1 kg).Body mass index is 35.18 kg/m.  General Appearance: Casual  Eye Contact:  Fair  Speech:  Clear and Coherent  Volume:  Normal  Mood:  Anxious and Depressed  Affect:  Congruent  Thought Process:  Goal Directed and Descriptions of Associations: Intact  Orientation:  Full (Time, Place, and Person)  Thought Content: Logical   Suicidal Thoughts:  No  Homicidal Thoughts:  No  Memory:  Immediate;   Fair Recent;   Fair Remote;   Fair  Judgement:  Fair  Insight:  Fair  Psychomotor Activity:  Normal  Concentration:  Concentration: Fair and Attention Span: Fair  Recall:  [employer] of Knowledge: Fair  Language: Fair  Akathisia:  No  Handed:  Right  AIMS (if indicated): not done  Assets:  Communication Skills Desire for Improvement Housing Social  Support Transportation  ADL's:  Intact  Cognition: WNL  Sleep:  Poor  Screenings: AUDIT    Flowsheet Row Admission (Discharged) from 01/06/2023 in [facility]  Alcohol Use Disorder Identification Test Final Score (AUDIT) 0      GAD-7    Flowsheet Row Office Visit from 06/30/2023 in [facility] Office Visit from 04/28/2023 in [facility] Office Visit from 03/18/2023 in [facility]  Total GAD-7 Score 6 9 10       Mini-Mental    Flowsheet Row Office Visit from 04/01/2023 in [facility], [facility] Office Visit from 03/26/2022 in [facility], [facility] Clinical Support from 03/03/2021 in [facility], [facility]  Total Score (max 30 points ) 30 30 30       PHQ2-9    Flowsheet Row Office Visit from 06/30/2023 in [facility] Office Visit from 04[facility]28/2023 in [facility] Office Visit from 03/18/2023 in [facility] Office Visit from 08/24/2022 in [facility], [facility] Office Visit from 03/26/2022 in [facility], [facility]  PHQ-2 Total Score 4 1 4 6 6   PHQ-9 Total Score 16 9 20 22 20       Flowsheet Row Office Visit from 06/30/2023 in [facility] Office Visit from 04/28/2023 in [facility] Office Visit from 03/18/2023 in [facility]  C-SSRS RISK CATEGORY Moderate Risk Moderate Risk Low Risk        Assessment and Plan: Shelbe Haglund is a 62 year old African-American female with recent inpatient behavioral health admission at [facility], multiple medical problems including multiple sclerosis with chronic fatigue, currently struggling with partner relational problems which does have an impact  on her mood sleep problems, will benefit from following plan.  Plan MDD-unstable Continue duloxetine 80 mg p.o. daily in divided dosage Discontinue venlafaxine-tapered off. Start Wellbutrin 75 mg p.o. daily in the morning. Continue CBT with Ms. Felecia Jan  Partner relational problem-unstable Patient will benefit from continued CBT.  Provided brief supportive psychotherapy.  Insomnia-multifactorial including her depression, medical problems like MS-unstable Patient to work on sleep hygiene. Continue trazodone 25-50 mg p.o. nightly as needed Wellbutrin medication likely will also help with staying awake during the day.  Follow-up in clinic in 4 to 7 weeks or sooner if needed.  Collaboration of Care: Collaboration of Care: Referral or follow-up with counselor/therapist AEB patient encouraged to continue CBT.  Patient/Guardian was advised Release of Information must be obtained prior to any record release in order to collaborate their care with an outside provider. Patient/Guardian was advised if they have not already done so to contact the registration department to sign all necessary forms in order for [place] to release information regarding their care.   Consent: Patient/Guardian gives verbal consent for treatment and assignment of benefits for services provided during this visit. Patient/Guardian expressed understanding and agreed to proceed.  This note was generated in part or whole with voice recognition software. Voice recognition is usually quite accurate but there are transcription errors that can and very often do occur. I apologize for any typographical errors that were not detected and corrected.     Jomarie Longs, MD 06/30/2023, 5:28 PM

## 2023-07-01 DIAGNOSIS — G35 Multiple sclerosis: Secondary | ICD-10-CM | POA: Diagnosis not present

## 2023-07-05 DIAGNOSIS — G35 Multiple sclerosis: Secondary | ICD-10-CM | POA: Diagnosis not present

## 2023-07-08 DIAGNOSIS — G35 Multiple sclerosis: Secondary | ICD-10-CM | POA: Diagnosis not present

## 2023-07-15 DIAGNOSIS — G35 Multiple sclerosis: Secondary | ICD-10-CM | POA: Diagnosis not present

## 2023-07-22 DIAGNOSIS — G35 Multiple sclerosis: Secondary | ICD-10-CM | POA: Diagnosis not present

## 2023-07-29 DIAGNOSIS — G35 Multiple sclerosis: Secondary | ICD-10-CM | POA: Diagnosis not present

## 2023-08-02 ENCOUNTER — Ambulatory Visit: Payer: Medicare Other | Admitting: Nurse Practitioner

## 2023-08-02 DIAGNOSIS — G35 Multiple sclerosis: Secondary | ICD-10-CM | POA: Diagnosis not present

## 2023-08-05 DIAGNOSIS — G35 Multiple sclerosis: Secondary | ICD-10-CM | POA: Diagnosis not present

## 2023-08-08 DIAGNOSIS — G35 Multiple sclerosis: Secondary | ICD-10-CM | POA: Diagnosis not present

## 2023-08-11 ENCOUNTER — Telehealth: Payer: Self-pay

## 2023-08-11 NOTE — Telephone Encounter (Signed)
Pt called that she having fever,coughing and sore throat and weakness she did Covid test earlier is negative advised her that we change her appt to My chart video and also advised her to do Covid test again in morning by [facility] and advised as per alyssa that she can OTC Mucinex and tylenol for fever and drink plenty of water and if her symptoms go to ED or urgent care

## 2023-08-12 ENCOUNTER — Encounter: Payer: Self-pay | Admitting: Nurse Practitioner

## 2023-08-12 ENCOUNTER — Other Ambulatory Visit: Payer: Self-pay

## 2023-08-12 ENCOUNTER — Telehealth: Payer: Self-pay | Admitting: Nurse Practitioner

## 2023-08-12 ENCOUNTER — Telehealth (INDEPENDENT_AMBULATORY_CARE_PROVIDER_SITE_OTHER): Payer: Medicare Other | Admitting: Physician Assistant

## 2023-08-12 VITALS — BP 142/91 | HR 97 | Temp 98.6°F | Resp 16 | Wt 195.4 lb

## 2023-08-12 DIAGNOSIS — U071 COVID-19: Secondary | ICD-10-CM

## 2023-08-12 MED ORDER — HYDROCOD POLI-CHLORPHE POLI ER 10-8 MG/5ML PO SUER
5.0000 mL | Freq: Two times a day (BID) | ORAL | 0 refills | Status: DC | PRN
Start: 2023-08-12 — End: 2023-11-25

## 2023-08-12 MED ORDER — NIRMATRELVIR/RITONAVIR (PAXLOVID)TABLET
3.0000 | ORAL_TABLET | Freq: Two times a day (BID) | ORAL | 0 refills | Status: AC
Start: 2023-08-12 — End: 2023-08-17

## 2023-08-12 NOTE — Telephone Encounter (Signed)
Spoke with pt she found coupon  and she will get med

## 2023-08-12 NOTE — Progress Notes (Signed)
[facility] [address] [place], [place] [address]  Internal MEDICINE  Telephone Visit  Patient Name: Rose Clements  [id number]  [id number]  Date of Service: 08/12/2023  I connected with the patient at 11:52 by telephone and verified the patients identity using two identifiers.   I discussed the limitations, risks, security and privacy concerns of performing an evaluation and management service by telephone and the availability of in person appointments. I also discussed with the patient that there may be a patient responsible charge related to the service.  The patient expressed understanding and agrees to proceed.    Chief Complaint  Patient presents with   Telephone Screen    Headache, cough, fever last night, body aches. Covid test positive.    Telephone Assessment    HPI Pt is here for virtual sick visit -Sore throat a few days ago and then started coughing and having headache, fever started yesterday and felt worse. Covid test initially negative yesterday, but then took another test and was positive -Taking tylenol -some wheezing, no SOB -has had covid previously and did well with paxlovid and used tussionex for cough which helped  Current Medication: Outpatient Encounter Medications as of 08/12/2023  Medication Sig Note   baclofen (LIORESAL) 10 MG tablet Take 1 tablet (10 mg total) by mouth 3 (three) times daily as needed for muscle spasms.    buPROPion (WELLBUTRIN) 75 MG tablet Take 1 tablet (75 mg total) by mouth in the morning.    cetirizine (ZYRTEC) 10 MG tablet Take 10 mg by mouth daily.    chlorpheniramine-HYDROcodone (TUSSIONEX) 10-8 MG/5ML Take 5 mLs by mouth every 12 (twelve) hours as needed for cough.    Cholecalciferol (VITAMIN D3) 1000 UNITS CAPS Take 1,000 Units by mouth in the morning and at bedtime.     cyanocobalamin (VITAMIN B12) 1000 MCG tablet Take 1,000 mcg by mouth daily.    desmopressin (DDAVP) 0.1 MG tablet Take 1 tablet (0.1 mg total) by  mouth daily. 01/07/2023: Last filled 08/25/22 90 day supply   DULoxetine (CYMBALTA) 20 MG capsule TAKE 1 CAPSULE(20 MG) BY MOUTH IN THE MORNING( TAKE WITH 60 MG CAPSULE FOR TOTAL DAILY DOSE OF 80 MG)    DULoxetine (CYMBALTA) 60 MG capsule Take 1 capsule (60 mg total) by mouth daily after supper. Take daily along with 20 mg , total of 80 mg daily    fluticasone (FLONASE) 50 MCG/ACT nasal spray SPRAY 2 SPRAYS INTO EACH NOSTRIL EVERY DAY    hydrOXYzine (ATARAX) 25 MG tablet Take 1 tablet (25 mg total) by mouth 3 (three) times daily as needed for anxiety.    natalizumab (TYSABRI) 300 MG/15ML injection Inject 300 mg into the vein every 30 (thirty) days.  11/18/2020: JCV ab drawn on 11/11/20 negative, index: 0.15    nirmatrelvir/ritonavir (PAXLOVID) 20 x 150 MG & 10 x 100MG  TABS Take 3 tablets by mouth 2 (two) times daily for 5 days.    pantoprazole (PROTONIX) 40 MG tablet Take 1 tablet (40 mg total) by mouth daily.    polyethylene glycol (MIRALAX / GLYCOLAX) 17 g packet Take 17 g by mouth daily.    rizatriptan (MAXALT-MLT) 10 MG disintegrating tablet Take 1 tablet (10 mg total) by mouth as needed for migraine. May repeat in 2 hours if needed    topiramate (TOPAMAX) 50 MG tablet Take 1 tablet (50 mg total) by mouth 2 (two) times daily.    traZODone (DESYREL) 50 MG tablet Take 0.5-1 tablets (25-50 mg total) by  mouth at bedtime as needed for sleep.    No facility-administered encounter medications on file as of 08/12/2023.    Surgical History: Past Surgical History:  Procedure Laterality Date   ABDOMINAL HYSTERECTOMY  2003   baclofen pump insertion Right 01/04/2018   CARPAL TUNNEL RELEASE Right 1997   CESAREAN SECTION  1989   COLONOSCOPY WITH PROPOFOL N/A 10/28/2020   Procedure: COLONOSCOPY WITH PROPOFOL;  Surgeon: [name], MD;  Location: [facility];  Service: Endoscopy;  Laterality: N/A;  PRIORITY 4 port a cath   CYSTECTOMY  2002   From chest   ESOPHAGOGASTRODUODENOSCOPY (EGD) WITH  PROPOFOL N/A 05/19/2016   Procedure: ESOPHAGOGASTRODUODENOSCOPY (EGD) WITH PROPOFOL;  Surgeon: [name], MD;  Location: [facility];  Service: Endoscopy;  Laterality: N/A;   IR IMAGING GUIDED PORT INSERTION  06/24/2020   KIDNEY DONATION Left 2002   OVARIAN CYST REMOVAL Left 2002   Ovarian mass removal   TONSILLECTOMY  1973   UPPER GASTROINTESTINAL ENDOSCOPY  11/19/2014   Benign appearing esophageal stricture-Dilated.    Medical History: Past Medical History:  Diagnosis Date   Constipation    Cystitis bacillary, chronic    Depression    Dysphagia    Dx by Beverely Risen on 10/15/14   Eosinophilic esophagitis 11/19/2014   GE junction benign stricture s/p balloon dilation, retained food contents. ?gastroparesis versus secondary to EE.   Esophageal dysphagia    GERD (gastroesophageal reflux disease)    Heartburn    Hypertension    Mental depression 2024   Migraine headache    "couple times each month"   Multilevel degenerative disc disease    Multiple sclerosis (HCC) 07/16/2015   Port-A-Cath in place    right side   Presence of intrathecal baclofen pump    right abdomen   Vertigo    Rare - none in last 6 months    Family History: Family History  Problem Relation Age of Onset   Diabetes Mother    Hypertension Mother    Heart disease Mother    Pulmonary embolism Mother    Diabetes Sister    Hypertension Sister    Heart disease Sister    Bipolar disorder Sister    Narcolepsy Brother    Dementia Maternal Aunt    Breast cancer Maternal Aunt    Diabetes Maternal Grandmother    Heart attack Maternal Grandmother    Diabetes Maternal Grandfather    Pancreatic cancer Maternal Grandfather     Social History   Socioeconomic History   Marital status: Married    Spouse name: [name]   Number of children: 1   Years of education: Not on file   Highest education level: Some college, no degree  Occupational History   Occupation: Disability  Tobacco Use   Smoking status:  Never   Smokeless tobacco: Never  Vaping Use   Vaping status: Never Used  Substance and Sexual Activity   Alcohol use: No    Alcohol/week: 0.0 standard drinks of alcohol    Comment: rare wine on Holidays   Drug use: No   Sexual activity: Not Currently  Other Topics Concern   Not on file  Social History Narrative   Lives with husband, Keleigh Stbernard   Right handed    Caffeine use: tea sometimes (mostly herbal)   Social Determinants of Health   Financial Resource Strain: Low Risk  (05/15/2021)   Overall Financial Resource Strain (CARDIA)    Difficulty of Paying Living Expenses: Not very hard  Food  Insecurity: No Food Insecurity (01/06/2023)   Hunger Vital Sign    Worried About Running Out of Food in the Last Year: Never true    Ran Out of Food in the Last Year: Never true  Transportation Needs: No Transportation Needs (01/06/2023)   PRAPARE - Administrator, Civil Service (Medical): No    Lack of Transportation (Non-Medical): No  Physical Activity: Not on file  Stress: Not on file  Social Connections: Not on file  Intimate Partner Violence: Not At Risk (01/06/2023)   Humiliation, Afraid, Rape, and Kick questionnaire    Fear of Current or Ex-Partner: No    Emotionally Abused: No    Physically Abused: No    Sexually Abused: No      Review of Systems  Constitutional:  Positive for fatigue and fever.  HENT:  Positive for congestion, postnasal drip and sore throat. Negative for mouth sores.   Respiratory:  Positive for cough and wheezing. Negative for shortness of breath.   Cardiovascular:  Negative for chest pain.  Genitourinary:  Negative for flank pain.  Neurological:  Positive for headaches.  Psychiatric/Behavioral: Negative.      Vital Signs: BP (!) 142/91   Pulse 97   Temp 98.6 F (37 C)   Resp 16   Wt 195 lb 6.4 oz (88.6 kg)   SpO2 96%   BMI 34.61 kg/m    Observation/Objective:  Pt is able to carry out conversation   Assessment/Plan: 1. Acute  COVID-19 Did well with paxlovid previously and will use to treat again and may use tussionex as needed for cough. Cautioned on S/E including drowsiness, especially if taken with other medications that can also make you drowsy. Rest and stay well hydrated - nirmatrelvir/ritonavir (PAXLOVID) 20 x 150 MG & 10 x 100MG  TABS; Take 3 tablets by mouth 2 (two) times daily for 5 days.  Dispense: 30 tablet; Refill: 0 - chlorpheniramine-HYDROcodone (TUSSIONEX) 10-8 MG/5ML; Take 5 mLs by mouth every 12 (twelve) hours as needed for cough.  Dispense: 115 mL; Refill: 0   General Counseling: Mable verbalizes understanding of the findings of today's phone visit and agrees with plan of treatment. I have discussed any further diagnostic evaluation that may be needed or ordered today. We also reviewed her medications today. she has been encouraged to call the office with any questions or concerns that should arise related to todays visit.    No orders of the defined types were placed in this encounter.   Meds ordered this encounter  Medications   nirmatrelvir/ritonavir (PAXLOVID) 20 x 150 MG & 10 x 100MG  TABS    Sig: Take 3 tablets by mouth 2 (two) times daily for 5 days.    Dispense:  30 tablet    Refill:  0   chlorpheniramine-HYDROcodone (TUSSIONEX) 10-8 MG/5ML    Sig: Take 5 mLs by mouth every 12 (twelve) hours as needed for cough.    Dispense:  115 mL    Refill:  0    Time spent:25 Minutes    Dr Lyndon Code Internal medicine

## 2023-08-15 DIAGNOSIS — G35 Multiple sclerosis: Secondary | ICD-10-CM | POA: Diagnosis not present

## 2023-08-22 DIAGNOSIS — G35 Multiple sclerosis: Secondary | ICD-10-CM | POA: Diagnosis not present

## 2023-08-25 ENCOUNTER — Encounter: Payer: Self-pay | Admitting: Psychiatry

## 2023-08-25 ENCOUNTER — Ambulatory Visit: Payer: Medicare Other | Admitting: Psychiatry

## 2023-08-25 VITALS — BP 126/86 | HR 91 | Temp 97.8°F | Ht 63.0 in | Wt 190.0 lb

## 2023-08-25 DIAGNOSIS — Z630 Problems in relationship with spouse or partner: Secondary | ICD-10-CM | POA: Diagnosis not present

## 2023-08-25 DIAGNOSIS — F3341 Major depressive disorder, recurrent, in partial remission: Secondary | ICD-10-CM | POA: Diagnosis not present

## 2023-08-25 DIAGNOSIS — G4701 Insomnia due to medical condition: Secondary | ICD-10-CM | POA: Diagnosis not present

## 2023-08-25 MED ORDER — BUPROPION HCL 75 MG PO TABS
75.0000 mg | ORAL_TABLET | Freq: Every morning | ORAL | 1 refills | Status: DC
Start: 2023-08-25 — End: 2024-02-23

## 2023-08-25 MED ORDER — DULOXETINE HCL 20 MG PO CPEP: 20.0000 mg | ORAL_CAPSULE | Freq: Every day | ORAL | 1 refills | Status: DC

## 2023-08-25 NOTE — Progress Notes (Signed)
BH MD OP Progress Note  08/25/2023 1:55 PM Yoon Knee  MRN:  [id number]  Chief Complaint:  Chief Complaint  Patient presents with   Follow-up   Anxiety   Depression   Medication Refill   HPI: Rose Clements is a 62 year old African-American female who is married, on SSD, lives in [place], has a history of MDD, multiple sclerosis on intrathecal baclofen pump, migraine headaches, hypertension was evaluated in office today.  Patient today reports she recently got infected with COVID-19.  This is the third time that she has had it.  She reports she is currently feeling better.  She did have some low energy, lack of motivation due to her COVID-19 infection.  She however prior to that was actively involved in a church group-[employer] program.  She reports that her program has ended and hence she is trying to find a new program.  Patient reports she continues to have sleep problems.  She gets around 4 to 5 hours of sleep at night and sleep is interrupted.  She reports she does have MS pain which affects her sleep.  She has not tried the trazodone yet.  However reports she will be motivated to try it and Pharmacist, hospital know.  Patient reports she is otherwise doing better with regards to her mood symptoms.  Her relationship with her spouse has improved since her spouse is trying to make some changes.  They had a mutual meeting with her therapist Ms. Felecia Jan and that seemed to help.  She continues to follow-up with her therapist and that has been beneficial.  Patient is currently compliant on the Wellbutrin, duloxetine.  Denies side effects.  Denies any suicidality, homicidality or perceptual disturbances.  Patient denies any other concerns today.    Visit Diagnosis:    ICD-10-CM   1. MDD (major depressive disorder), recurrent, in partial remission (HCC)  F33.41 buPROPion (WELLBUTRIN) 75 MG tablet    2. Insomnia due to medical condition  G47.01 DULoxetine (CYMBALTA) 20 MG capsule    mood, MS    3. Partner relational problem  Z63.0       Past Psychiatric History: I have reviewed past psychiatric history from progress note on 03/18/2023.  Past inpatient admission-01/07/2023 - 01/10/2023 after a suicide attempt.  Past trials of medications-multiple including modafinil for sleep problems.  Past Medical History:  Past Medical History:  Diagnosis Date   Constipation    Cystitis bacillary, chronic    Depression    Dysphagia    Dx by Beverely Risen on 10/15/14   Eosinophilic esophagitis 11/19/2014   GE junction benign stricture s/p balloon dilation, retained food contents. ?gastroparesis versus secondary to EE.   Esophageal dysphagia    GERD (gastroesophageal reflux disease)    Heartburn    Hypertension    Mental depression 2024   Migraine headache    "couple times each month"   Multilevel degenerative disc disease    Multiple sclerosis (HCC) 07/16/2015   Port-A-Cath in place    right side   Presence of intrathecal baclofen pump    right abdomen   Vertigo    Rare - none in last 6 months    Past Surgical History:  Procedure Laterality Date   ABDOMINAL HYSTERECTOMY  2003   baclofen pump insertion Right 01/04/2018   CARPAL TUNNEL RELEASE Right 1997   CESAREAN SECTION  1989   COLONOSCOPY WITH PROPOFOL N/A 10/28/2020   Procedure: COLONOSCOPY WITH PROPOFOL;  Surgeon: [name], MD;  Location: [facility];  Service: Endoscopy;  Laterality: N/A;  PRIORITY 4 port a cath   CYSTECTOMY  2002   From chest   ESOPHAGOGASTRODUODENOSCOPY (EGD) WITH PROPOFOL N/A 05/19/2016   Procedure: ESOPHAGOGASTRODUODENOSCOPY (EGD) WITH PROPOFOL;  Surgeon: [name], MD;  Location: [facility];  Service: Endoscopy;  Laterality: N/A;   IR IMAGING GUIDED PORT INSERTION  06/24/2020   KIDNEY DONATION Left 2002   OVARIAN CYST REMOVAL Left 2002   Ovarian mass removal   TONSILLECTOMY  1973   UPPER GASTROINTESTINAL ENDOSCOPY  11/19/2014   Benign appearing esophageal stricture-Dilated.     Family Psychiatric History: I have reviewed family psychiatric history from progress note on 03/18/2023.  Family History:  Family History  Problem Relation Age of Onset   Diabetes Mother    Hypertension Mother    Heart disease Mother    Pulmonary embolism Mother    Diabetes Sister    Hypertension Sister    Heart disease Sister    Bipolar disorder Sister    Narcolepsy Brother    Dementia Maternal Aunt    Breast cancer Maternal Aunt    Diabetes Maternal Grandmother    Heart attack Maternal Grandmother    Diabetes Maternal Grandfather    Pancreatic cancer Maternal Grandfather     Social History: I have reviewed social history from progress note on 03/18/2023. Social History   Socioeconomic History   Marital status: Married    Spouse name: [name]   Number of children: 1   Years of education: Not on file   Highest education level: Some college, no degree  Occupational History   Occupation: Disability  Tobacco Use   Smoking status: Never   Smokeless tobacco: Never  Vaping Use   Vaping status: Never Used  Substance and Sexual Activity   Alcohol use: No    Alcohol/week: 0.0 standard drinks of alcohol    Comment: rare wine on Holidays   Drug use: No   Sexual activity: Not Currently  Other Topics Concern   Not on file  Social History Narrative   Lives with husband, Rose Clements   Right handed    Caffeine use: tea sometimes (mostly herbal)   Social Determinants of Health   Financial Resource Strain: Low Risk  (05/15/2021)   Overall Financial Resource Strain (CARDIA)    Difficulty of Paying Living Expenses: Not very hard  Food Insecurity: No Food Insecurity (01/06/2023)   Hunger Vital Sign    Worried About Running Out of Food in the Last Year: Never true    Ran Out of Food in the Last Year: Never true  Transportation Needs: No Transportation Needs (01/06/2023)   PRAPARE - Administrator, Civil Service (Medical): No    Lack of Transportation (Non-Medical):  No  Physical Activity: Not on file  Stress: Not on file  Social Connections: Not on file    Allergies:  Allergies  Allergen Reactions   Lisinopril Hives    Metabolic Disorder Labs: No results found for: "HGBA1C", "MPG" No results found for: "PROLACTIN" Lab Results  Component Value Date   CHOL 267 (H) 03/31/2022   TRIG 61 03/31/2022   HDL 80 03/31/2022   CHOLHDL 3.3 03/31/2022   LDLCALC 178 (H) 03/31/2022   LDLCALC 144 (H) 02/07/2020   Lab Results  Component Value Date   TSH 2.320 03/31/2022   TSH 1.350 02/07/2020    Therapeutic Level Labs: No results found for: "LITHIUM" No results found for: "VALPROATE" No results found for: "CBMZ"  Current Medications: Current Outpatient  Medications  Medication Sig Dispense Refill   baclofen (LIORESAL) 10 MG tablet Take 1 tablet (10 mg total) by mouth 3 (three) times daily as needed for muscle spasms. 270 each 1   cetirizine (ZYRTEC) 10 MG tablet Take 10 mg by mouth daily.     chlorpheniramine-HYDROcodone (TUSSIONEX) 10-8 MG/5ML Take 5 mLs by mouth every 12 (twelve) hours as needed for cough. 115 mL 0   Cholecalciferol (VITAMIN D3) 1000 UNITS CAPS Take 1,000 Units by mouth in the morning and at bedtime.      cyanocobalamin (VITAMIN B12) 1000 MCG tablet Take 1,000 mcg by mouth daily.     desmopressin (DDAVP) 0.1 MG tablet Take 1 tablet (0.1 mg total) by mouth daily. 90 tablet 3   DULoxetine (CYMBALTA) 60 MG capsule Take 1 capsule (60 mg total) by mouth daily after supper. Take daily along with 20 mg , total of 80 mg daily 90 capsule 0   fluticasone (FLONASE) 50 MCG/ACT nasal spray SPRAY 2 SPRAYS INTO EACH NOSTRIL EVERY DAY 48 mL 2   hydrOXYzine (ATARAX) 25 MG tablet Take 1 tablet (25 mg total) by mouth 3 (three) times daily as needed for anxiety. 30 tablet 1   natalizumab (TYSABRI) 300 MG/15ML injection Inject 300 mg into the vein every 30 (thirty) days.      pantoprazole (PROTONIX) 40 MG tablet Take 1 tablet (40 mg total) by mouth  daily. 90 tablet 1   polyethylene glycol (MIRALAX / GLYCOLAX) 17 g packet Take 17 g by mouth daily. 28 each 1   rizatriptan (MAXALT-MLT) 10 MG disintegrating tablet Take 1 tablet (10 mg total) by mouth as needed for migraine. May repeat in 2 hours if needed 9 tablet 1   topiramate (TOPAMAX) 50 MG tablet Take 1 tablet (50 mg total) by mouth 2 (two) times daily. 60 tablet 11   traZODone (DESYREL) 50 MG tablet Take 0.5-1 tablets (25-50 mg total) by mouth at bedtime as needed for sleep. 30 tablet 1   buPROPion (WELLBUTRIN) 75 MG tablet Take 1 tablet (75 mg total) by mouth in the morning. 90 tablet 1   DULoxetine (CYMBALTA) 20 MG capsule Take 1 capsule (20 mg total) by mouth daily. Take along with 60 mg daily 90 capsule 1   No current facility-administered medications for this visit.     Musculoskeletal: Strength & Muscle Tone:  limited on left side due to MS Gait & Station:  Walks with cane Patient leans: N/A  Psychiatric Specialty Exam: Review of Systems  Musculoskeletal:  Positive for myalgias.       Has MS  Psychiatric/Behavioral:  Positive for sleep disturbance. The patient is nervous/anxious.     Blood pressure 126/86, pulse 91, temperature 97.8 F (36.6 C), temperature source Skin, height 5\' 3"  (1.6 m), weight 190 lb (86.2 kg).Body mass index is 33.66 kg/m.  General Appearance: Fairly Groomed  Eye Contact:  Fair  Speech:  Normal Rate  Volume:  Normal  Mood:  Anxious  Affect:  Appropriate  Thought Process:  Goal Directed and Descriptions of Associations: Intact  Orientation:  Full (Time, Place, and Person)  Thought Content: Logical   Suicidal Thoughts:  No  Homicidal Thoughts:  No  Memory:  Immediate;   Fair Recent;   Fair Remote;   Fair  Judgement:  Fair  Insight:  Fair  Psychomotor Activity:  Normal  Concentration:  Concentration: Fair and Attention Span: Fair  Recall:  [employer] of Knowledge: Fair  Language: Fair  Akathisia:  No  Handed:  Right  AIMS (if  indicated): not done  Assets:  Communication Skills Desire for Improvement Housing Social Support  ADL's:  Intact  Cognition: WNL  Sleep:  Poor   Screenings: AUDIT    Flowsheet Row Admission (Discharged) from [facility]06/[facility] in [facility]  Alcohol Use Disorder Identification Test Final Score (AUDIT) 0      GAD-7    Flowsheet Row Office Visit from 08/25/2023 in [facility] Office Visit from 06/30/2023 in [facility] Office Visit from 04/28/2023 in [facility] Office Visit from 03/18/2023 in [facility]  Total GAD-7 Score 4 6 9 10       Mini-Mental    Flowsheet Row Office Visit from 04/01/2023 in [facility] Office Visit from 03/26/2022 in [facility] Clinical Support from 03/03/2021 in [facility]  Total Score (max 30 points ) 30 30 30       PHQ2-9    Flowsheet Row Office Visit from 08/25/2023 in [facility] Office Visit from 06/30/2023 in [facility] Office Visit from 04/28/2023 in [facility] Office Visit from 03/18/2023 in [facility] Office Visit from 08/24/2022 in [facility]  PHQ-2 Total Score 2 4 1 4 6   PHQ-9 Total Score 10 16 9 20 22       Flowsheet Row Office Visit from 08/25/2023 in [facility] Office Visit from 06/30/2023 in [facility] Office Visit from 04/28/2023 in [facility]  C-SSRS RISK CATEGORY Moderate Risk Moderate Risk Moderate Risk        Assessment and Plan: Rose Clements is a 62 year old African-American female who is currently  improving with regards to her mood symptoms, patient with fatigue mostly because of recent COVID-19 infection, as well as has comorbid MS and chronic fatigue.  Patient however making progress, will benefit from the following plan.  Plan MDD in partial remission Duloxetine 80 mg p.o. daily divided dosage Wellbutrin 75 mg p.o. daily in the morning Continue CBT with Ms. Felecia Jan.  Partner relational problem-improving Patient to continue CBT.  They had a family session recently with therapist.  Insomnia-multifactorial-unstable Patient has been noncompliant with trazodone, continue trazodone 25-50 mg p.o. nightly as needed.  Patient encouraged to start taking the medication. She will also need sufficient pain management.     Collaboration of Care: Collaboration of Care: Referral or follow-up with counselor/therapist AEB encouraged to follow up with therapist  Patient/Guardian was advised Release of Information must be obtained prior to any record release in order to collaborate their care with an outside provider. Patient/Guardian was advised if they have not already done so to contact the registration department to sign all necessary forms in order for [place] to release information regarding their care.   Consent: Patient/Guardian gives verbal consent for treatment and assignment of benefits for services provided during this visit. Patient/Guardian expressed understanding and agreed to proceed.  Follow-up in clinic in 2 to 3 months or sooner if needed.  This note was generated in part or whole with voice recognition software. Voice recognition is usually quite accurate but there are transcription errors that can and very often do occur. I apologize for any typographical errors that were not detected and corrected.     Zein Helbing,  MD 08/25/2023, 1:55 PM

## 2023-08-29 DIAGNOSIS — G35 Multiple sclerosis: Secondary | ICD-10-CM | POA: Diagnosis not present

## 2023-08-31 DIAGNOSIS — G35 Multiple sclerosis: Secondary | ICD-10-CM | POA: Diagnosis not present

## 2023-09-01 DIAGNOSIS — G35 Multiple sclerosis: Secondary | ICD-10-CM | POA: Diagnosis not present

## 2023-09-01 DIAGNOSIS — M6249 Contracture of muscle, multiple sites: Secondary | ICD-10-CM | POA: Diagnosis not present

## 2023-09-05 DIAGNOSIS — G35 Multiple sclerosis: Secondary | ICD-10-CM | POA: Diagnosis not present

## 2023-09-07 DIAGNOSIS — G35 Multiple sclerosis: Secondary | ICD-10-CM | POA: Diagnosis not present

## 2023-09-14 DIAGNOSIS — G35 Multiple sclerosis: Secondary | ICD-10-CM | POA: Diagnosis not present

## 2023-09-15 ENCOUNTER — Other Ambulatory Visit: Payer: Self-pay | Admitting: Neurology

## 2023-09-15 ENCOUNTER — Other Ambulatory Visit: Payer: Self-pay | Admitting: Nurse Practitioner

## 2023-09-15 DIAGNOSIS — T7840XS Allergy, unspecified, sequela: Secondary | ICD-10-CM

## 2023-09-15 NOTE — Telephone Encounter (Signed)
Last seen on 04/13/23 Follow up scheduled on 10/19/23

## 2023-09-17 ENCOUNTER — Other Ambulatory Visit: Payer: Self-pay | Admitting: Internal Medicine

## 2023-09-17 DIAGNOSIS — Z1231 Encounter for screening mammogram for malignant neoplasm of breast: Secondary | ICD-10-CM

## 2023-09-21 DIAGNOSIS — G35 Multiple sclerosis: Secondary | ICD-10-CM | POA: Diagnosis not present

## 2023-09-28 DIAGNOSIS — G35 Multiple sclerosis: Secondary | ICD-10-CM | POA: Diagnosis not present

## 2023-09-29 ENCOUNTER — Ambulatory Visit
Admission: RE | Admit: 2023-09-29 | Discharge: 2023-09-29 | Disposition: A | Discharge status: Home / Self Care | Payer: Medicare Other | Source: Ambulatory Visit | Attending: Internal Medicine | Admitting: Internal Medicine

## 2023-09-29 DIAGNOSIS — Z1231 Encounter for screening mammogram for malignant neoplasm of breast: Secondary | ICD-10-CM | POA: Diagnosis not present

## 2023-10-05 DIAGNOSIS — G35 Multiple sclerosis: Secondary | ICD-10-CM | POA: Diagnosis not present

## 2023-10-08 DIAGNOSIS — G35 Multiple sclerosis: Secondary | ICD-10-CM | POA: Diagnosis not present

## 2023-10-15 DIAGNOSIS — G35 Multiple sclerosis: Secondary | ICD-10-CM | POA: Diagnosis not present

## 2023-10-19 ENCOUNTER — Ambulatory Visit: Payer: Medicare Other | Admitting: Adult Health

## 2023-10-19 ENCOUNTER — Encounter: Payer: Self-pay | Admitting: Adult Health

## 2023-10-19 VITALS — BP 120/76 | HR 72 | Ht 63.0 in | Wt 196.0 lb

## 2023-10-19 DIAGNOSIS — R252 Cramp and spasm: Secondary | ICD-10-CM

## 2023-10-19 DIAGNOSIS — G35 Multiple sclerosis: Secondary | ICD-10-CM

## 2023-10-19 DIAGNOSIS — G43009 Migraine without aura, not intractable, without status migrainosus: Secondary | ICD-10-CM | POA: Diagnosis not present

## 2023-10-19 MED ORDER — TOPIRAMATE 50 MG PO TABS: ORAL_TABLET | ORAL | 11 refills | Status: DC

## 2023-10-19 MED ORDER — RIZATRIPTAN BENZOATE 10 MG PO TBDP: 10.0000 mg | ORAL_TABLET | ORAL | 11 refills | Status: DC | PRN

## 2023-10-19 NOTE — Progress Notes (Signed)
[facility]  PATIENT: Rose Clements DOB: 09/29/1961  REFERRING DOCTOR OR PCP:  Beverely Risen, MD SOURCE: Patient, notes from primary care and neurology, MRI and laboratory reports, MRI images personally reviewed.  _________________________________   HISTORICAL  CHIEF COMPLAINT:  Chief Complaint  Patient presents with   Follow-up    Rm 3, here alone Pt is here for MS follow up. Pt states she is getting migraines again, pt states she needs a refill on Maxalt. States she has questions about getting her Baclofen pump replaced, questions who would set that up for her.     [name] is a 62 y.o. woman with relapsing multiple sclerosis.    Update 10/18/2023 Returns for follow-up visit.  Prior visit with Dr. Epimenio Foot on 04/13/2023. She is on Tysabri and tolerates it well.    JCV Ab was negative (0.17  04/08/2023).  She has not had any exacerbations.  Has repeat infusion next week, plans on repeat JCV labs at that time.   Gait is the same, ell last week after right leg gave out, thankfully no injury, no falls recently prior to that. Use of rollator walker at all times.  Both legs are weak, slightly weaker on right though spasms sometimes worse on left.     She also has right hand weakness and pain which has progressed some since prior visit. She has a right greater than left foot drop She has some tingling in legs bilaterally, left > right.  She has stiffness in her legs bilaterally.  Current use of baclofen pump (145mcg/day at 4.3/hr) and use of baclofen 10 mg up to 3 times daily as needed but typically only uses twice daily, denies side effects.  Baclofen pump filled twice per year.  Reports she was told pump need to be changed as it will be 5 years since placement in 12/2023 and needed to be replaced by 09/2024.  Previously, Dr. Epimenio Foot reluctant to raise dose further unless experiencing a lot more symptoms as she relies some on the spasticity to ambulate.   Continues to follow with  behavioral health, has f/u with psychology Friday and psychiatry in December. Mood has been stable on current regimen of duloxetine and bupropion for MDD and recently restarted trazodone for insomnia as she continues to struggle with sleep, MS pain contributing.  She reports increased migraines over the past 6-7 months, can be worse at night or can wake up with headache, currently on topiramate 50 mg twice daily. Reports daily headaches but can fluctuate in severity but unable to say how often they are severe.  Headaches associated with nausea, photophobia and phonophobia.  Use of rizatriptan with benefit but usually runs out early, occasional use of Excedrin, denies any OTC overuse. She continues sinus issue contributing and plans on discussing referral to ENT with PCP.   DDAVP seemed to help her nocturia more at first than now..    Because she was a kidney donor, she has only 1 kidney.  Creatinine and BUN have remained in the normal levels over the last few years but I would be reluctant to raise the dose further  She has fatigue and sleeiness.     Modafinil helped some but she stopped    She does not sleep well.   She snores but does not have OSA.    She reports PSG with Dr. Freda Munro in [place] showed narcolepsy.  She does not described cataplexy symptoms     She has vivid dreams and occasional  hypnagogic hallucinations and sleep paralysis.         MS HIstory:  She  was diagnosed with relapsing remitting MS in 2011 after she presented with stiffness in her left arm and a clumsy gait with some falls.   In retrospect some of the leg symptoms started a little earlier.    She had MRI's and LP with lesions and CSF findings consistent with MS.    She was placed on Avonex.   She did ok initially but then had an exacerbation with more issues with her legs.   She was started on Tysabri about 2016 after these issues.   She has done well with Tysabri and has not had any exacerbations.   Initially, she was  diagnosed and treated in [place].  She moved to [facility]) in 2016 and has seen Dr. Ronalee Red at [facility] since that time.  She transferred care to [place] mid 2021.    IMAGING:  MRI of the cervical spine 03/07/2018 and 05/15/2018 and 01/19/2020 and 04/25/2023 all show a couple T2 hyperintense lesions within the spinal cord.  One to the left adjacent to C2-C3 and 1 to the right adjacent to C4.    Also has right thyroid cyst.   MRI of the brain 05/15/2018 and 03/07/2018 and 2/212/2021 and 04/25/2023 show multiple T2/flair hyperintense foci.  Somewhat nonspecific but a few are periventricular and juxtacortical.  There is no change between the 3 studies.     MRI of the thoracic spine 05/16/2015 did not show any definite lesions.   Hemangiomas within the T2 and T6 vertebral body.    REVIEW OF SYSTEMS: Constitutional: No fevers, chills, sweats, or change in appetite Eyes: No visual changes, double vision, eye pain Ear, nose and throat: No hearing loss, ear pain, nasal congestion, sore throat Cardiovascular: No chest pain, palpitations Respiratory:  No shortness of breath at rest or with exertion.   No wheezes GastrointestinaI: No nausea, vomiting, diarrhea, abdominal pain, fecal incontinence Genitourinary:  No dysuria, urinary retention or frequency.  No nocturia. Musculoskeletal:  No neck pain, back pain Integumentary: No rash, pruritus, skin lesions Neurological: as above Psychiatric: No depression at this time.  No anxiety Endocrine: No palpitations, diaphoresis, change in appetite, change in weigh or increased thirst Hematologic/Lymphatic:  No anemia, purpura, petechiae. Allergic/Immunologic: No itchy/runny eyes, nasal congestion, recent allergic reactions, rashes  ALLERGIES: Allergies  Allergen Reactions   Lisinopril Hives    HOME MEDICATIONS:  Current Outpatient Medications:    baclofen (LIORESAL) 10 MG tablet, Take 1 tablet (10 mg total) by mouth 3 (three) times daily as needed for  muscle spasms., Disp: 270 each, Rfl: 1   buPROPion (WELLBUTRIN) 75 MG tablet, Take 1 tablet (75 mg total) by mouth in the morning., Disp: 90 tablet, Rfl: 1   cetirizine (ZYRTEC) 10 MG tablet, Take 10 mg by mouth daily., Disp: , Rfl:    chlorpheniramine-HYDROcodone (TUSSIONEX) 10-8 MG/5ML, Take 5 mLs by mouth every 12 (twelve) hours as needed for cough., Disp: 115 mL, Rfl: 0   Cholecalciferol (VITAMIN D3) 1000 UNITS CAPS, Take 1,000 Units by mouth in the morning and at bedtime. , Disp: , Rfl:    cyanocobalamin (VITAMIN B12) 1000 MCG tablet, Take 1,000 mcg by mouth daily., Disp: , Rfl:    desmopressin (DDAVP) 0.1 MG tablet, TAKE 1 TABLET BY MOUTH EVERY DAY, Disp: 90 tablet, Rfl: 2   DULoxetine (CYMBALTA) 20 MG capsule, Take 1 capsule (20 mg total) by mouth daily. Take along with 60  mg daily, Disp: 90 capsule, Rfl: 1   DULoxetine (CYMBALTA) 60 MG capsule, Take 1 capsule (60 mg total) by mouth daily after supper. Take daily along with 20 mg , total of 80 mg daily, Disp: 90 capsule, Rfl: 0   fluticasone (FLONASE) 50 MCG/ACT nasal spray, SHAKE LIQUID AND USE 2 SPRAYS IN EACH NOSTRIL EVERY DAY, Disp: 48 g, Rfl: 3   hydrOXYzine (ATARAX) 25 MG tablet, Take 1 tablet (25 mg total) by mouth 3 (three) times daily as needed for anxiety., Disp: 30 tablet, Rfl: 1   natalizumab (TYSABRI) 300 MG/15ML injection, Inject 300 mg into the vein every 30 (thirty) days. , Disp: , Rfl:    pantoprazole (PROTONIX) 40 MG tablet, Take 1 tablet (40 mg total) by mouth daily., Disp: 90 tablet, Rfl: 1   polyethylene glycol (MIRALAX / GLYCOLAX) 17 g packet, Take 17 g by mouth daily., Disp: 28 each, Rfl: 1   rizatriptan (MAXALT-MLT) 10 MG disintegrating tablet, Take 1 tablet (10 mg total) by mouth as needed for migraine. May repeat in 2 hours if needed, Disp: 12 tablet, Rfl: 11   topiramate (TOPAMAX) 50 MG tablet, Take 1 tablet (50 mg total) by mouth daily AND 2 tablets (100 mg total) at bedtime., Disp: 90 tablet, Rfl: 11   traZODone  (DESYREL) 50 MG tablet, Take 0.5-1 tablets (25-50 mg total) by mouth at bedtime as needed for sleep., Disp: 30 tablet, Rfl: 1  PAST MEDICAL HISTORY: Past Medical History:  Diagnosis Date   Constipation    Cystitis bacillary, chronic    Depression    Dysphagia    Dx by Beverely Risen on 10/15/14   Eosinophilic esophagitis 11/19/2014   GE junction benign stricture s/p balloon dilation, retained food contents. ?gastroparesis versus secondary to EE.   Esophageal dysphagia    GERD (gastroesophageal reflux disease)    Heartburn    Hypertension    Mental depression 2024   Migraine headache    "couple times each month"   Multilevel degenerative disc disease    Multiple sclerosis (HCC) 07/16/2015   Port-A-Cath in place    right side   Presence of intrathecal baclofen pump    right abdomen   Vertigo    Rare - none in last 6 months    PAST SURGICAL HISTORY: Past Surgical History:  Procedure Laterality Date   ABDOMINAL HYSTERECTOMY  2003   baclofen pump insertion Right 01/04/2018   CARPAL TUNNEL RELEASE Right 1997   CESAREAN SECTION  1989   COLONOSCOPY WITH PROPOFOL N/A 10/28/2020   Procedure: COLONOSCOPY WITH PROPOFOL;  Surgeon: [name], MD;  Location: [facility];  Service: Endoscopy;  Laterality: N/A;  PRIORITY 4 port a cath   CYSTECTOMY  2002   From chest   ESOPHAGOGASTRODUODENOSCOPY (EGD) WITH PROPOFOL N/A 05/19/2016   Procedure: ESOPHAGOGASTRODUODENOSCOPY (EGD) WITH PROPOFOL;  Surgeon: [name], MD;  Location: [facility];  Service: Endoscopy;  Laterality: N/A;   IR IMAGING GUIDED PORT INSERTION  06/24/2020   KIDNEY DONATION Left 2002   OVARIAN CYST REMOVAL Left 2002   Ovarian mass removal   TONSILLECTOMY  1973   UPPER GASTROINTESTINAL ENDOSCOPY  11/19/2014   Benign appearing esophageal stricture-Dilated.    FAMILY HISTORY: Family History  Problem Relation Age of Onset   Diabetes Mother    Hypertension Mother    Heart disease Mother    Pulmonary  embolism Mother    Diabetes Sister    Hypertension Sister    Heart disease Sister  Bipolar disorder Sister    Narcolepsy Brother    Dementia Maternal Aunt    Breast cancer Maternal Aunt    Diabetes Maternal Grandmother    Heart attack Maternal Grandmother    Diabetes Maternal Grandfather    Pancreatic cancer Maternal Grandfather     SOCIAL HISTORY:  Social History   Socioeconomic History   Marital status: Married    Spouse name: [name]   Number of children: 1   Years of education: Not on file   Highest education level: Some college, no degree  Occupational History   Occupation: Disability  Tobacco Use   Smoking status: Never   Smokeless tobacco: Never  Vaping Use   Vaping status: Never Used  Substance and Sexual Activity   Alcohol use: No    Alcohol/week: 0.0 standard drinks of alcohol    Comment: rare wine on Holidays   Drug use: No   Sexual activity: Not Currently  Other Topics Concern   Not on file  Social History Narrative   Lives with husband, Sherilyn Hauswirth   Right handed    Caffeine use: tea sometimes (mostly herbal)   Social Determinants of Health   Financial Resource Strain: Low Risk  (05/15/2021)   Overall Financial Resource Strain (CARDIA)    Difficulty of Paying Living Expenses: Not very hard  Food Insecurity: No Food Insecurity (01/06/2023)   Hunger Vital Sign    Worried About Running Out of Food in the Last Year: Never true    Ran Out of Food in the Last Year: Never true  Transportation Needs: No Transportation Needs (01/06/2023)   PRAPARE - Administrator, Civil Service (Medical): No    Lack of Transportation (Non-Medical): No  Physical Activity: Not on file  Stress: Not on file  Social Connections: Not on file  Intimate Partner Violence: Not At Risk (01/06/2023)   Humiliation, Afraid, Rape, and Kick questionnaire    Fear of Current or Ex-Partner: No    Emotionally Abused: No    Physically Abused: No    Sexually Abused: No      PHYSICAL EXAM  Vitals:   10/19/23 1345  BP: 120/76  Pulse: 72  Weight: 196 lb (88.9 kg)  Height: 5\' 3"  (1.6 m)   Body mass index is 34.72 kg/m.  General: The patient is a very pleasant middle-age African-American female, well-developed and well-nourished and in no acute distress  Skin: Extremities are without rash or edema.   Neurologic Exam  Mental status: The patient is alert and oriented x 3 at the time of the examination. The patient has apparent normal recent and remote memory, with an apparently normal attention span and concentration ability.   Speech is normal.  Cranial nerves: Extraocular movements are full.  Facial strength was normal.  No obvious hearing deficits are noted.  Motor:  Muscle bulk is normal.   She has increased muscle tone in legs.  Muscle tone is increased, left greater than right leg.  Mildly increased tone RUE.  She has reduced rapid alternating movements in the left arm and strength was 4/5 in the triceps and 5/5 elsewhere.  Strength was 4 /5 in both legs  Sensory: Sensory testing is intact to pinprick, soft touch and vibration sensation in all 4 extremities.  Coordination: Cerebellar testing reveals good finger-nose-finger and slightly reduced heel-to-shin bilaterally.  Gait and station: Station is normal.  She can walk without a walker but has a wide stance and reduced stride.  Gait is much better  with the walker.  She is unable to tandem walk.       DIAGNOSTIC DATA (LABS, IMAGING, TESTING) - I reviewed patient records, labs, notes, testing and imaging myself where available.  Lab Results  Component Value Date   WBC 7.0 04/08/2023   HGB 11.7 04/08/2023   HCT 36.7 04/08/2023   MCV 92 04/08/2023   PLT 289 04/08/2023      Component Value Date/Time   NA 140 01/05/2023 1547   NA 143 03/31/2022 1006   K 3.9 01/05/2023 1547   CL 108 01/05/2023 1547   CO2 24 01/05/2023 1547   GLUCOSE 108 (H) 01/05/2023 1547   BUN 12 01/05/2023 1547    BUN 10 03/31/2022 1006   CREATININE 0.78 01/05/2023 1547   CALCIUM 9.2 01/05/2023 1547   PROT 7.2 01/05/2023 1547   PROT 6.5 03/31/2022 1006   ALBUMIN 4.2 01/05/2023 1547   ALBUMIN 4.5 03/31/2022 1006   AST 18 01/05/2023 1547   ALT 9 01/05/2023 1547   ALKPHOS 75 01/05/2023 1547   BILITOT 0.5 01/05/2023 1547   BILITOT 0.2 03/31/2022 1006   GFRNONAA >60 01/05/2023 1547   GFRAA 76 02/07/2020 1050   Lab Results  Component Value Date   CHOL 267 (H) 03/31/2022   HDL 80 03/31/2022   LDLCALC 178 (H) 03/31/2022   TRIG 61 03/31/2022   CHOLHDL 3.3 03/31/2022   No results found for: "HGBA1C" Lab Results  Component Value Date   VITAMINB12 445 09/15/2022   Lab Results  Component Value Date   TSH 2.320 03/31/2022       ASSESSMENT AND PLAN  Multiple sclerosis (HCC)  Migraine without aura and without status migrainosus, not intractable  Spasticity  1.   Continue Tysabri infusions.   JCV antibody normal (04/2023). She now has a central line.     MR brain and c-spine 04/2023 stable  2.    Continue topiramate but increase from 50mg  BID to 50/100 due to increased migraine frequency and continue maxalt prn for migraine.   3.    Stay active and exercise as tolerated, continued use of RW for fall prevention 4.     continue low-dose desmopressin at night to try to help the nocturia.    5.    She has a baclofen pump. --- only 104 mcg/day but no severe spasms on this dose although reports worsening RUE spasms which has been interfering with sleep. Also on baclofen 10mg  TID PRN. Will discuss dosage adjustment with Dr. Epimenio Foot as previously hesitant to adjust due to weakness. She is also due to replacement next year, will discuss this further with Dr. Epimenio Foot as well. Continue to follow with [facility] for refills twice yearly 6.    Continue to follow with behavioral health for MDD and insomnia  6.    Return for follow-up in 6 months  or call sooner if new or worsening neurologic symptoms.        I spent 40 minutes of face-to-face and non-face-to-face time with patient.  This included previsit chart review, lab review, study review, order entry, electronic health record documentation, patient education and discussion regarding above diagnoses and treatment plan and answered all other questions to patient's satisfaction  Ihor Austin, [facility]  [facility] [address] [place], [place] [address]  Phone [phone] Fax [phone] Note: This document was prepared with digital dictation and possible smart phrase technology. Any transcriptional errors that result from this process are unintentional.

## 2023-10-19 NOTE — Patient Instructions (Addendum)
Your Plan:  Continue Tysabri for MS  Continue topamax 50mg  AM and increase bedtime dose to 100mg  (2 tablets)  Continue maxalt as needed  Continue baclofen at current dose for now - will discuss dosage adjustment with Dr. Epimenio Foot as well as further guidance in regards to pump replacement   Continue desmopressin nightly for nocturia      Follow up in 6 months or call earlier if needed     Thank you for coming to see [place] at [facility]. I hope we have been able to provide you high quality care today.  You may receive a patient satisfaction survey over the next few weeks. We would appreciate your feedback and comments so that we may continue to improve ourselves and the health of our patients.

## 2023-10-22 DIAGNOSIS — G35 Multiple sclerosis: Secondary | ICD-10-CM | POA: Diagnosis not present

## 2023-10-26 ENCOUNTER — Other Ambulatory Visit: Payer: Self-pay

## 2023-10-26 DIAGNOSIS — G35 Multiple sclerosis: Secondary | ICD-10-CM

## 2023-10-27 ENCOUNTER — Other Ambulatory Visit: Payer: Self-pay

## 2023-10-27 ENCOUNTER — Telehealth: Payer: Self-pay | Admitting: *Deleted

## 2023-10-27 ENCOUNTER — Other Ambulatory Visit: Payer: Self-pay | Admitting: Psychiatry

## 2023-10-27 DIAGNOSIS — G35 Multiple sclerosis: Secondary | ICD-10-CM | POA: Diagnosis not present

## 2023-10-27 DIAGNOSIS — Z630 Problems in relationship with spouse or partner: Secondary | ICD-10-CM

## 2023-10-27 NOTE — Telephone Encounter (Signed)
Placed JCV lab in [facility] lock box for routine lab pick up. Results pending. 

## 2023-10-28 LAB — CBC WITH DIFFERENTIAL/PLATELET
Basophils Absolute: 0.1 10*3/uL (ref 0.0–0.2)
Basos: 1 %
EOS (ABSOLUTE): 0.2 10*3/uL (ref 0.0–0.4)
Eos: 3 %
Hematocrit: 35.8 % (ref 34.0–46.6)
Hemoglobin: 11.6 g/dL (ref 11.1–15.9)
Immature Grans (Abs): 0 10*3/uL (ref 0.0–0.1)
Immature Granulocytes: 0 %
Lymphocytes Absolute: 4.2 10*3/uL — ABNORMAL HIGH (ref 0.7–3.1)
Lymphs: 65 %
MCH: 30.1 pg (ref 26.6–33.0)
MCHC: 32.4 g/dL (ref 31.5–35.7)
MCV: 93 fL (ref 79–97)
Monocytes Absolute: 0.5 10*3/uL (ref 0.1–0.9)
Monocytes: 8 %
NRBC: 1 % — ABNORMAL HIGH (ref 0–0)
Neutrophils Absolute: 1.5 10*3/uL (ref 1.4–7.0)
Neutrophils: 23 %
Platelets: 266 10*3/uL (ref 150–450)
RBC: 3.85 x10E6/uL (ref 3.77–5.28)
RDW: 12.5 % (ref 11.7–15.4)
WBC: 6.4 10*3/uL (ref 3.4–10.8)

## 2023-10-29 DIAGNOSIS — G35 Multiple sclerosis: Secondary | ICD-10-CM | POA: Diagnosis not present

## 2023-11-05 DIAGNOSIS — G35 Multiple sclerosis: Secondary | ICD-10-CM | POA: Diagnosis not present

## 2023-11-07 DIAGNOSIS — G35 Multiple sclerosis: Secondary | ICD-10-CM | POA: Diagnosis not present

## 2023-11-10 ENCOUNTER — Telehealth: Payer: Self-pay | Admitting: Adult Health

## 2023-11-10 DIAGNOSIS — Z978 Presence of other specified devices: Secondary | ICD-10-CM

## 2023-11-10 DIAGNOSIS — G35 Multiple sclerosis: Secondary | ICD-10-CM

## 2023-11-10 NOTE — Telephone Encounter (Signed)
Please advise patient that Dr. Epimenio Foot recommended increasing baclofen to 125mcg/day, please notify [facility] who she gets her pump refills through. Also let patient know pump replacements are through [facility]. Will place referral to their office as she will be due for pump replacement in January 2025.

## 2023-11-10 NOTE — Telephone Encounter (Signed)
Call to patient to update her about medication orders and she had no questions at this time.

## 2023-11-10 NOTE — Telephone Encounter (Signed)
Called and relayed verbal orders increase dose to 137mcg/day to Diane at [facility].

## 2023-11-11 NOTE — Telephone Encounter (Signed)
Faxed signed order back to [facility] at [phone]. Received fax confirmation.

## 2023-11-14 DIAGNOSIS — G35 Multiple sclerosis: Secondary | ICD-10-CM | POA: Diagnosis not present

## 2023-11-17 ENCOUNTER — Telehealth: Payer: Self-pay | Admitting: Adult Health

## 2023-11-17 NOTE — Telephone Encounter (Signed)
Referral for neurosurgery fax to [facility]. Phone: [phone], Fax: [phone] 

## 2023-11-21 DIAGNOSIS — G35 Multiple sclerosis: Secondary | ICD-10-CM | POA: Diagnosis not present

## 2023-11-22 ENCOUNTER — Other Ambulatory Visit: Payer: Self-pay | Admitting: Nurse Practitioner

## 2023-11-22 DIAGNOSIS — K219 Gastro-esophageal reflux disease without esophagitis: Secondary | ICD-10-CM

## 2023-11-23 DIAGNOSIS — G35 Multiple sclerosis: Secondary | ICD-10-CM | POA: Diagnosis not present

## 2023-11-25 ENCOUNTER — Encounter: Payer: Self-pay | Admitting: Psychiatry

## 2023-11-25 ENCOUNTER — Ambulatory Visit: Payer: Medicare Other | Admitting: Psychiatry

## 2023-11-25 VITALS — BP 134/87 | HR 67 | Temp 97.8°F | Ht 63.0 in | Wt 196.0 lb

## 2023-11-25 DIAGNOSIS — Z630 Problems in relationship with spouse or partner: Secondary | ICD-10-CM

## 2023-11-25 DIAGNOSIS — F3342 Major depressive disorder, recurrent, in full remission: Secondary | ICD-10-CM | POA: Diagnosis not present

## 2023-11-25 DIAGNOSIS — G4701 Insomnia due to medical condition: Secondary | ICD-10-CM

## 2023-11-25 MED ORDER — HYDROXYZINE HCL 25 MG PO TABS
25.0000 mg | ORAL_TABLET | Freq: Three times a day (TID) | ORAL | 2 refills | Status: AC | PRN
Start: 2023-11-25 — End: ?

## 2023-11-25 NOTE — Progress Notes (Signed)
BH MD OP Progress Note  11/25/2023 11:36 AM Rose Clements  MRN:  [id number]  Chief Complaint:  Chief Complaint  Patient presents with   Follow-up   Anxiety   Depression   Medication Refill   HPI: Rose Clements is a 62 year old African-American female who is married, on SSD, lives in [place], has a history of MDD, multiple sclerosis on intrathecal baclofen pump, migraine headaches, hypertension was evaluated in office today.   The patient reported feeling generally well, with no significant depressive or anxiety symptoms. However, she did experience a recent episode of anxiety related to atypical acid reflux symptoms. She also noted that this time of year is emotionally challenging due to the loss of her mother and the geographical distance from her immediate family.  The patient is currently in therapy and is scheduled to see her therapist after the Christmas holiday. She reported some recent stress related to her spouse's desire to move back to [place] to care for his ailing mother, a decision the patient was initially resistant to due to her established medical care in her current location and her physical limitations related to her multiple sclerosis (MS). The patient ultimately asserted her preference to stay, and the couple decided to remain in their current home.  The patient's sleep patterns were reported as inconsistent, often sleeping during the day and waking intermittently at night. She acknowledged a lack of sleep hygiene and non-compliance with prescribed trazodone. She also reported variable eating habits, sometimes overeating and sometimes not eating at all, largely due to stomach issues including diarrhea and severe cramps related to diverticulitis and slowed bowel movements due to MS.  The patient is currently on a regimen of duloxetine 80mg , bupropion 75mg , and other medications for various conditions. She has a baclofen pump for her MS, which is due to be replaced soon due  to increased pain and other symptoms in her legs. The patient is also scheduled to have routine blood work done.  Patient denies any suicidality, homicidality or perceptual disturbances.    Visit Diagnosis:    ICD-10-CM   1. Recurrent major depressive disorder, in full remission (HCC)  F33.42 hydrOXYzine (ATARAX) 25 MG tablet    2. Insomnia due to medical condition  G47.01    mood, MS, lack of sleep hygiene      Past Psychiatric History: I have reviewed past psychiatric history from progress note on 03/18/2023.  Past inpatient admission-01/07/2023 - 01/10/2023 after suicide attempt.  Past trials of medications-multiple including modafinil for sleep Problem.  Past Medical History:  Past Medical History:  Diagnosis Date   Constipation    Cystitis bacillary, chronic    Depression    Dysphagia    Dx by Beverely Risen on 10/15/14   Eosinophilic esophagitis 11/19/2014   GE junction benign stricture s/p balloon dilation, retained food contents. ?gastroparesis versus secondary to EE.   Esophageal dysphagia    GERD (gastroesophageal reflux disease)    Heartburn    Hypertension    Mental depression 2024   Migraine headache    "couple times each month"   Multilevel degenerative disc disease    Multiple sclerosis (HCC) 07/16/2015   Port-A-Cath in place    right side   Presence of intrathecal baclofen pump    right abdomen   Vertigo    Rare - none in last 6 months    Past Surgical History:  Procedure Laterality Date   ABDOMINAL HYSTERECTOMY  2003   baclofen pump insertion Right 01/04/2018   CARPAL  TUNNEL RELEASE Right 1997   CESAREAN SECTION  1989   COLONOSCOPY WITH PROPOFOL N/A 10/28/2020   Procedure: COLONOSCOPY WITH PROPOFOL;  Surgeon: [name], MD;  Location: [facility];  Service: Endoscopy;  Laterality: N/A;  PRIORITY 4 port a cath   CYSTECTOMY  2002   From chest   ESOPHAGOGASTRODUODENOSCOPY (EGD) WITH PROPOFOL N/A 05/19/2016   Procedure: ESOPHAGOGASTRODUODENOSCOPY  (EGD) WITH PROPOFOL;  Surgeon: [name], MD;  Location: [facility];  Service: Endoscopy;  Laterality: N/A;   IR IMAGING GUIDED PORT INSERTION  06/24/2020   KIDNEY DONATION Left 2002   OVARIAN CYST REMOVAL Left 2002   Ovarian mass removal   TONSILLECTOMY  1973   UPPER GASTROINTESTINAL ENDOSCOPY  11/19/2014   Benign appearing esophageal stricture-Dilated.    Family Psychiatric History: I have reviewed family psychiatric history from progress note on 03/18/2023.  Family History:  Family History  Problem Relation Age of Onset   Diabetes Mother    Hypertension Mother    Heart disease Mother    Pulmonary embolism Mother    Diabetes Sister    Hypertension Sister    Heart disease Sister    Bipolar disorder Sister    Narcolepsy Brother    Dementia Maternal Aunt    Breast cancer Maternal Aunt    Diabetes Maternal Grandmother    Heart attack Maternal Grandmother    Diabetes Maternal Grandfather    Pancreatic cancer Maternal Grandfather     Social History: I have reviewed social history from progress note on 03/18/2023. Social History   Socioeconomic History   Marital status: Married    Spouse name: Rose Clements   Number of children: 1   Years of education: Not on file   Highest education level: Some college, no degree  Occupational History   Occupation: Disability  Tobacco Use   Smoking status: Never   Smokeless tobacco: Never  Vaping Use   Vaping status: Never Used  Substance and Sexual Activity   Alcohol use: No    Alcohol/week: 0.0 standard drinks of alcohol    Comment: rare wine on Holidays   Drug use: No   Sexual activity: Not Currently  Other Topics Concern   Not on file  Social History Narrative   Lives with husband, Rose Clements   Right handed    Caffeine use: tea sometimes (mostly herbal)   Social Drivers of Health   Financial Resource Strain: Low Risk  (05/15/2021)   Overall Financial Resource Strain (CARDIA)    Difficulty of Paying Living Expenses: Not  very hard  Food Insecurity: No Food Insecurity (01/06/2023)   Hunger Vital Sign    Worried About Running Out of Food in the Last Year: Never true    Ran Out of Food in the Last Year: Never true  Transportation Needs: No Transportation Needs (01/06/2023)   PRAPARE - Administrator, Civil Service (Medical): No    Lack of Transportation (Non-Medical): No  Physical Activity: Not on file  Stress: Not on file  Social Connections: Not on file    Allergies:  Allergies  Allergen Reactions   Lisinopril Hives    Metabolic Disorder Labs: No results found for: "HGBA1C", "MPG" No results found for: "PROLACTIN" Lab Results  Component Value Date   CHOL 267 (H) 03/31/2022   TRIG 61 03/31/2022   HDL 80 03/31/2022   CHOLHDL 3.3 03/31/2022   LDLCALC 178 (H) 03/31/2022   LDLCALC 144 (H) 02/07/2020   Lab Results  Component Value Date  TSH 2.320 03/31/2022   TSH 1.350 02/07/2020    Therapeutic Level Labs: No results found for: "LITHIUM" No results found for: "VALPROATE" No results found for: "CBMZ"  Current Medications: Current Outpatient Medications  Medication Sig Dispense Refill   baclofen (LIORESAL) 10 MG tablet Take 1 tablet (10 mg total) by mouth 3 (three) times daily as needed for muscle spasms. 270 each 1   buPROPion (WELLBUTRIN) 75 MG tablet Take 1 tablet (75 mg total) by mouth in the morning. 90 tablet 1   cetirizine (ZYRTEC) 10 MG tablet Take 10 mg by mouth daily.     Cholecalciferol (VITAMIN D3) 1000 UNITS CAPS Take 1,000 Units by mouth in the morning and at bedtime.      cyanocobalamin (VITAMIN B12) 1000 MCG tablet Take 1,000 mcg by mouth daily.     desmopressin (DDAVP) 0.1 MG tablet TAKE 1 TABLET BY MOUTH EVERY DAY 90 tablet 2   DULoxetine (CYMBALTA) 20 MG capsule Take 1 capsule (20 mg total) by mouth daily. Take along with 60 mg daily 90 capsule 1   DULoxetine (CYMBALTA) 60 MG capsule TAKE 1 CAPSULE BY MOUTH DAILY AFTER SUPPER. TAKE WITH 20 MG DOSE FOR TOTAL OF  80 MG DAILY. 90 capsule 0   fluticasone (FLONASE) 50 MCG/ACT nasal spray SHAKE LIQUID AND USE 2 SPRAYS IN EACH NOSTRIL EVERY DAY 48 g 3   natalizumab (TYSABRI) 300 MG/15ML injection Inject 300 mg into the vein every 30 (thirty) days.      pantoprazole (PROTONIX) 40 MG tablet TAKE 1 TABLET(40 MG) BY MOUTH DAILY 90 tablet 1   polyethylene glycol (MIRALAX / GLYCOLAX) 17 g packet Take 17 g by mouth daily. 28 each 1   rizatriptan (MAXALT-MLT) 10 MG disintegrating tablet Take 1 tablet (10 mg total) by mouth as needed for migraine. May repeat in 2 hours if needed 12 tablet 11   topiramate (TOPAMAX) 50 MG tablet Take 1 tablet (50 mg total) by mouth daily AND 2 tablets (100 mg total) at bedtime. 90 tablet 11   traZODone (DESYREL) 50 MG tablet Take 0.5-1 tablets (25-50 mg total) by mouth at bedtime as needed for sleep. 30 tablet 1   hydrOXYzine (ATARAX) 25 MG tablet Take 1 tablet (25 mg total) by mouth 3 (three) times daily as needed for anxiety. 90 tablet 2   No current facility-administered medications for this visit.     Musculoskeletal: Strength & Muscle Tone: within normal limits Gait & Station: unsteady walks with cane Patient leans: N/A  Psychiatric Specialty Exam: Review of Systems  Psychiatric/Behavioral:  Positive for sleep disturbance. The patient is nervous/anxious.     Blood pressure 134/87, pulse 67, temperature 97.8 F (36.6 C), temperature source Skin, height 5\' 3"  (1.6 m), weight 196 lb (88.9 kg).Body mass index is 34.72 kg/m.  General Appearance: Casual  Eye Contact:  Fair  Speech:  Clear and Coherent  Volume:  Normal  Mood:  Anxious coping okay  Affect:  Congruent  Thought Process:  Goal Directed and Descriptions of Associations: Intact  Orientation:  Full (Time, Place, and Person)  Thought Content: Logical   Suicidal Thoughts:  No  Homicidal Thoughts:  No  Memory:  Immediate;   Fair Recent;   Fair Remote;   Fair  Judgement:  Fair  Insight:  Fair  Psychomotor  Activity:  Normal  Concentration:  Concentration: Fair and Attention Span: Fair  Recall:  [employer] of Knowledge: Fair  Language: Fair  Akathisia:  No  Handed:  Right  AIMS (if indicated): not done  Assets:  Desire for Improvement Housing Social Support  ADL's:  Intact  Cognition: WNL  Sleep:   Poor   Screenings: AUDIT    Flowsheet Row Admission (Discharged) from 01/06/2023 in [facility]  Alcohol Use Disorder Identification Test Final Score (AUDIT) 0      GAD-7    Flowsheet Row Office Visit from 08/25/2023 in [facility] Office Visit from 06/30/2023 in [facility] Office Visit from 04/28/2023 in [facility] Office Visit from 03/18/2023 in [facility]  Total GAD-7 Score 4 6 9 10       Mini-Mental    Flowsheet Row Office Visit from 04/01/2023 in [facility] Office Visit from 03/26/2022 in [facility] Clinical Support from 03/03/2021 in [facility]  Total Score (max 30 points ) 30 30 30       PHQ2-9    Flowsheet Row Office Visit from 11/25/2023 in [facility] Office Visit from 08/25/2023 in [facility] Office Visit from 06/30/2023 in [facility] Office Visit from 04/28/2023 in [facility] Office Visit from 03/18/2023 in [facility]  PHQ-2 Total Score 1 2 4 1 4   PHQ-9 Total Score -- 10 16 9 20       Flowsheet Row Office Visit from 11/25/2023 in [facility] Office Visit from 08/25/2023 in [facility] Associates Office Visit from 06/30/2023 in [facility] Psych[facility]  Associates  C-SSRS RISK CATEGORY Moderate Risk Moderate Risk Moderate Risk        Assessment and Plan: Rose Clements is a 62 year old African-American female who is currently improving on the current medication regimen with regards to mood symptoms although has sleep issues, discussed assessment and plan as noted below.   Major Depressive Disorder in remission No significant depressive symptoms recently. Mood well-managed with current medication regimen. Engages in social activities (church, Bible study). Discussed importance of social engagement for mental health. - Continue current medication regimen - Duloxetine 80 mg p.o. daily - Wellbutrin 75 mg daily in the morning - Encourage continued social engagement - Continue psychotherapy with Ms. Felecia Jan.  Insomnia-unstable Patient with lack of sleep hygiene, discussed sleep hygiene techniques - Continue trazodone 25-50 mg at bedtime as needed. - Will need sufficient pain management.  Multiple Sclerosis (MS) Increased leg pain and symptoms. Baclofen pump due for replacement after five years. Awaiting neurosurgeon contact for surgery scheduling. Discussed potential baclofen dosage increase post-surgery to manage pain. - Await neurosurgeon contact for baclofen pump replacement - Consider increasing baclofen dosage post-surgery  Gastroesophageal Reflux Disease (GERD) Intermittent acid reflux contributing to anxiety. Currently on Protonix (pantoprazole). Discussed importance of managing GERD to reduce anxiety episodes. - Continue Protonix (pantoprazole)  Diverticulitis Intermittent stomach cramps and diarrhea affecting eating habits. Discussed dietary adjustments to manage symptoms. - Adjust diet as needed  General Health Maintenance Received flu, pneumonia, RSV, and shingles vaccines. Needs to reschedule COVID-19 booster. Discussed importance of staying up-to-date with vaccinations. - Reschedule COVID-19 booster  Follow-up -  Schedule follow-up appointment in three months. Collaboration of Care: Collaboration of Care: Referral or follow-up with counselor/therapist AEB patient encouraged to continue CBT  Patient/Guardian was advised Release of Information must be obtained prior to any record release in order to  collaborate their care with an outside provider. Patient/Guardian was advised if they have not already done so to contact the registration department to sign all necessary forms in order for [place] to release information regarding their care.   Consent: Patient/Guardian gives verbal consent for treatment and assignment of benefits for services provided during this visit. Patient/Guardian expressed understanding and agreed to proceed.   This note was generated in part or whole with voice recognition software. Voice recognition is usually quite accurate but there are transcription errors that can and very often do occur. I apologize for any typographical errors that were not detected and corrected.    Jomarie Longs, MD 11/26/2023, 7:48 AM

## 2023-11-28 DIAGNOSIS — G35 Multiple sclerosis: Secondary | ICD-10-CM | POA: Diagnosis not present

## 2023-12-05 DIAGNOSIS — G35 Multiple sclerosis: Secondary | ICD-10-CM | POA: Diagnosis not present

## 2023-12-08 DIAGNOSIS — G35 Multiple sclerosis: Secondary | ICD-10-CM | POA: Diagnosis not present

## 2023-12-09 DIAGNOSIS — M6249 Contracture of muscle, multiple sites: Secondary | ICD-10-CM | POA: Diagnosis not present

## 2023-12-09 DIAGNOSIS — G35 Multiple sclerosis: Secondary | ICD-10-CM | POA: Diagnosis not present

## 2023-12-14 ENCOUNTER — Other Ambulatory Visit: Payer: Self-pay | Admitting: Psychiatry

## 2023-12-14 DIAGNOSIS — G4701 Insomnia due to medical condition: Secondary | ICD-10-CM

## 2023-12-14 DIAGNOSIS — F3341 Major depressive disorder, recurrent, in partial remission: Secondary | ICD-10-CM

## 2023-12-15 DIAGNOSIS — G35 Multiple sclerosis: Secondary | ICD-10-CM | POA: Diagnosis not present

## 2023-12-15 DIAGNOSIS — Z978 Presence of other specified devices: Secondary | ICD-10-CM | POA: Diagnosis not present

## 2023-12-20 DIAGNOSIS — G35 Multiple sclerosis: Secondary | ICD-10-CM | POA: Diagnosis not present

## 2023-12-21 ENCOUNTER — Other Ambulatory Visit: Payer: Self-pay | Admitting: Psychiatry

## 2023-12-21 DIAGNOSIS — Z630 Problems in relationship with spouse or partner: Secondary | ICD-10-CM

## 2023-12-22 ENCOUNTER — Telehealth: Payer: Self-pay | Admitting: *Deleted

## 2023-12-22 ENCOUNTER — Other Ambulatory Visit: Payer: Self-pay | Admitting: Neurological Surgery

## 2023-12-22 ENCOUNTER — Other Ambulatory Visit: Payer: Self-pay | Admitting: Psychiatry

## 2023-12-22 DIAGNOSIS — G4701 Insomnia due to medical condition: Secondary | ICD-10-CM

## 2023-12-22 DIAGNOSIS — G35 Multiple sclerosis: Secondary | ICD-10-CM | POA: Diagnosis not present

## 2023-12-22 NOTE — Telephone Encounter (Signed)
 Noted.

## 2023-12-22 NOTE — Telephone Encounter (Signed)
 called pharamcy spoke with Rose Clements she states pt pick up 90 day supply on 12-17 pt should have enough until march

## 2023-12-22 NOTE — Telephone Encounter (Signed)
 pt left a message that she needed a refill on the duloxetine  20mg . but it looks like pt should have enough until march. will call the pharmacy to confirm

## 2023-12-22 NOTE — Telephone Encounter (Signed)
 pt was notified what pharmacy had stated she she went and looked into a different medication bag and she found it.

## 2023-12-22 NOTE — Telephone Encounter (Signed)
 Faxed below form/office notes back. Received fax confirmation.

## 2023-12-29 DIAGNOSIS — G35 Multiple sclerosis: Secondary | ICD-10-CM | POA: Diagnosis not present

## 2023-12-31 ENCOUNTER — Other Ambulatory Visit: Payer: Self-pay | Admitting: Neurological Surgery

## 2024-01-05 ENCOUNTER — Other Ambulatory Visit: Payer: Self-pay | Admitting: Nurse Practitioner

## 2024-01-05 DIAGNOSIS — G35 Multiple sclerosis: Secondary | ICD-10-CM

## 2024-01-08 DIAGNOSIS — G35 Multiple sclerosis: Secondary | ICD-10-CM | POA: Diagnosis not present

## 2024-01-14 NOTE — Pre-Procedure Instructions (Addendum)
 Surgical Instructions   Your procedure is scheduled on January 20, 2024. Report to [facility] Main Entrance "A" at 5:30 A.M., then check in with the Admitting office. Any questions or running late day of surgery: call [phone]  Questions prior to your surgery date: call [phone], Monday-Friday, 8am-4pm. If you experience any cold or flu symptoms such as cough, fever, chills, shortness of breath, etc. between now and your scheduled surgery, please notify us  at the above number.     Remember:  Do not eat or drink after midnight the night before your surgery    Take these medicines the morning of surgery with A SIP OF WATER : buPROPion  (WELLBUTRIN )  cetirizine (ZYRTEC)  DULoxetine  (CYMBALTA )  fluticasone  (FLONASE ) nasal spray  pantoprazole  (PROTONIX )  topiramate  (TOPAMAX )    May take these medicines IF NEEDED: baclofen  (LIORESAL )  hydrOXYzine  (ATARAX )  rizatriptan  (MAXALT -MLT)    STOP taking your natalizumab  (TYSABRI ) injections FOUR WEEKS prior to surgery.   One week prior to surgery, STOP taking any Aspirin (unless otherwise instructed by your surgeon) Aleve, Naproxen, Ibuprofen, Motrin, Advil, Goody's, BC's, all herbal medications, fish oil, and non-prescription vitamins.                     Do NOT Smoke (Tobacco/Vaping) for 24 hours prior to your procedure.  If you use a CPAP at night, you may bring your mask/headgear for your overnight stay.   You will be asked to remove any contacts, glasses, piercing's, hearing aid's, dentures/partials prior to surgery. Please bring cases for these items if needed.    Patients discharged the day of surgery will not be allowed to drive home, and someone needs to stay with them for 24 hours.  SURGICAL WAITING ROOM VISITATION Patients may have no more than 2 support people in the waiting area - these visitors may rotate.   Pre-op nurse will coordinate an appropriate time for 1 ADULT support person, who may not rotate, to  accompany patient in pre-op.  Children under the age of 73 must have an adult with them who is not the patient and must remain in the main waiting area with an adult.  If the patient needs to stay at the hospital during part of their recovery, the visitor guidelines for inpatient rooms apply.  Please refer to the [facility] website for the visitor guidelines for any additional information.   If you received a COVID test during your pre-op visit  it is requested that you wear a mask when out in public, stay away from anyone that may not be feeling well and notify your surgeon if you develop symptoms. If you have been in contact with anyone that has tested positive in the last 10 days please notify you surgeon.      Pre-operative 5 CHG Bathing Instructions   You can play a key role in reducing the risk of infection after surgery. Your skin needs to be as free of germs as possible. You can reduce the number of germs on your skin by washing with CHG (chlorhexidine  gluconate) soap before surgery. CHG is an antiseptic soap that kills germs and continues to kill germs even after washing.   DO NOT use if you have an allergy to chlorhexidine /CHG or antibacterial soaps. If your skin becomes reddened or irritated, stop using the CHG and notify one of our RNs at [phone].   Please shower with the CHG soap starting 4 days before surgery using the following schedule:  Please keep in mind the following:  DO NOT shave, including legs and underarms, starting the day of your first shower.   You may shave your face at any point before/day of surgery.  Place clean sheets on your bed the day you start using CHG soap. Use a clean washcloth (not used since being washed) for each shower. DO NOT sleep with pets once you start using the CHG.   CHG Shower Instructions:  Wash your face and private area with normal soap. If you choose to wash your hair, wash first with your normal shampoo.  After you use  shampoo/soap, rinse your hair and body thoroughly to remove shampoo/soap residue.  Turn the water  OFF and apply about 3 tablespoons (45 ml) of CHG soap to a CLEAN washcloth.  Apply CHG soap ONLY FROM YOUR NECK DOWN TO YOUR TOES (washing for 3-5 minutes)  DO NOT use CHG soap on face, private areas, open wounds, or sores.  Pay special attention to the area where your surgery is being performed.  If you are having back surgery, having someone wash your back for you may be helpful. Wait 2 minutes after CHG soap is applied, then you may rinse off the CHG soap.  Pat dry with a clean towel  Put on clean clothes/pajamas   If you choose to wear lotion, please use ONLY the CHG-compatible lotions that are listed below.  Additional instructions for the day of surgery: DO NOT APPLY any lotions, deodorants, cologne, or perfumes.   Do not bring valuables to the hospital. [facility] is not responsible for any belongings/valuables. Do not wear nail polish, gel polish, artificial nails, or any other type of covering on natural nails (fingers and toes) Do not wear jewelry or makeup Put on clean/comfortable clothes.  Please brush your teeth.  Ask your nurse before applying any prescription medications to the skin.     CHG Compatible Lotions   Aveeno Moisturizing lotion  Cetaphil Moisturizing Cream  Cetaphil Moisturizing Lotion  Clairol Herbal Essence Moisturizing Lotion, Dry Skin  Clairol Herbal Essence Moisturizing Lotion, Extra Dry Skin  Clairol Herbal Essence Moisturizing Lotion, Normal Skin  Curel Age Defying Therapeutic Moisturizing Lotion with Alpha Hydroxy  Curel Extreme Care Body Lotion  Curel Soothing Hands Moisturizing Hand Lotion  Curel Therapeutic Moisturizing Cream, Fragrance-Free  Curel Therapeutic Moisturizing Lotion, Fragrance-Free  Curel Therapeutic Moisturizing Lotion, Original Formula  Eucerin Daily Replenishing Lotion  Eucerin Dry Skin Therapy Plus Alpha Hydroxy Crme  Eucerin  Dry Skin Therapy Plus Alpha Hydroxy Lotion  Eucerin Original Crme  Eucerin Original Lotion  Eucerin Plus Crme Eucerin Plus Lotion  Eucerin TriLipid Replenishing Lotion  Keri Anti-Bacterial Hand Lotion  Keri Deep Conditioning Original Lotion Dry Skin Formula Softly Scented  Keri Deep Conditioning Original Lotion, Fragrance Free Sensitive Skin Formula  Keri Lotion Fast Absorbing Fragrance Free Sensitive Skin Formula  Keri Lotion Fast Absorbing Softly Scented Dry Skin Formula  Keri Original Lotion  Keri Skin Renewal Lotion Keri Silky Smooth Lotion  Keri Silky Smooth Sensitive Skin Lotion  Nivea Body Creamy Conditioning Oil  Nivea Body Extra Enriched Lotion  Nivea Body Original Lotion  Nivea Body Sheer Moisturizing Lotion Nivea Crme  Nivea Skin Firming Lotion  NutraDerm 30 Skin Lotion  NutraDerm Skin Lotion  NutraDerm Therapeutic Skin Cream  NutraDerm Therapeutic Skin Lotion  ProShield Protective Hand Cream  Provon moisturizing lotion  Please read over the following fact sheets that you were given.

## 2024-01-15 DIAGNOSIS — G35 Multiple sclerosis: Secondary | ICD-10-CM | POA: Diagnosis not present

## 2024-01-17 ENCOUNTER — Encounter (HOSPITAL_COMMUNITY): Payer: Self-pay

## 2024-01-17 ENCOUNTER — Encounter (HOSPITAL_COMMUNITY)
Admission: RE | Admit: 2024-01-17 | Discharge: 2024-01-17 | Disposition: A | Discharge status: Home / Self Care | Payer: Medicare Other | Source: Ambulatory Visit | Attending: Neurological Surgery

## 2024-01-17 ENCOUNTER — Other Ambulatory Visit: Payer: Self-pay

## 2024-01-17 VITALS — BP 124/81 | HR 84 | Temp 98.3°F | Resp 17 | Ht 63.0 in | Wt 196.4 lb

## 2024-01-17 DIAGNOSIS — Z9689 Presence of other specified functional implants: Secondary | ICD-10-CM | POA: Diagnosis not present

## 2024-01-17 DIAGNOSIS — Z01818 Encounter for other preprocedural examination: Secondary | ICD-10-CM | POA: Insufficient documentation

## 2024-01-17 DIAGNOSIS — Z01810 Encounter for preprocedural cardiovascular examination: Secondary | ICD-10-CM | POA: Diagnosis present

## 2024-01-17 DIAGNOSIS — Z79899 Other long term (current) drug therapy: Secondary | ICD-10-CM | POA: Insufficient documentation

## 2024-01-17 DIAGNOSIS — I2510 Atherosclerotic heart disease of native coronary artery without angina pectoris: Secondary | ICD-10-CM | POA: Insufficient documentation

## 2024-01-17 DIAGNOSIS — K219 Gastro-esophageal reflux disease without esophagitis: Secondary | ICD-10-CM | POA: Diagnosis not present

## 2024-01-17 DIAGNOSIS — G35 Multiple sclerosis: Secondary | ICD-10-CM | POA: Insufficient documentation

## 2024-01-17 DIAGNOSIS — I10 Essential (primary) hypertension: Secondary | ICD-10-CM | POA: Insufficient documentation

## 2024-01-17 DIAGNOSIS — Z01812 Encounter for preprocedural laboratory examination: Secondary | ICD-10-CM | POA: Diagnosis present

## 2024-01-17 DIAGNOSIS — R1319 Other dysphagia: Secondary | ICD-10-CM | POA: Diagnosis not present

## 2024-01-17 HISTORY — DX: Myoneural disorder, unspecified: G70.9

## 2024-01-17 HISTORY — DX: Anxiety disorder, unspecified: F41.9

## 2024-01-17 LAB — BASIC METABOLIC PANEL
Anion gap: 8 (ref 5–15)
BUN: 13 mg/dL (ref 8–23)
CO2: 19 mmol/L — ABNORMAL LOW (ref 22–32)
Calcium: 8.8 mg/dL — ABNORMAL LOW (ref 8.9–10.3)
Chloride: 111 mmol/L (ref 98–111)
Creatinine, Ser: 0.9 mg/dL (ref 0.44–1.00)
GFR, Estimated: >60 mL/min (ref >60)
Glucose, Bld: 73 mg/dL (ref 70–99)
Potassium: 5.3 mmol/L — ABNORMAL HIGH (ref 3.5–5.1)
Sodium: 138 mmol/L (ref 135–145)

## 2024-01-17 LAB — CBC
HCT: 37.6 % (ref 36.0–46.0)
Hemoglobin: 11.8 g/dL — ABNORMAL LOW (ref 12.0–15.0)
MCH: 29.9 pg (ref 26.0–34.0)
MCHC: 31.4 g/dL (ref 30.0–36.0)
MCV: 95.4 fL (ref 80.0–100.0)
Platelets: 247 10*3/uL (ref 150–400)
RBC: 3.94 MIL/uL (ref 3.87–5.11)
RDW: 12.3 % (ref 11.5–15.5)
WBC: 6.3 10*3/uL (ref 4.0–10.5)
nRBC: 0.6 % — ABNORMAL HIGH (ref 0.0–0.2)

## 2024-01-17 LAB — SURGICAL PCR SCREEN
MRSA, PCR: NEGATIVE
Staphylococcus aureus: NEGATIVE

## 2024-01-17 NOTE — Progress Notes (Signed)
 PCP - Laurence Pons, NP Cardiologist - Denies Neurologist - Dr. Hortensia Ma  PPM/ICD - Denies Device Orders - n/a Rep Notified - n/a  Chest x-ray - n/a EKG - 01/17/2024 Stress Test - Denies ECHO - Denies Cardiac Cath - Denies  Sleep Study - Denies CPAP - n/a  No DM  Last dose of GLP1 agonist- n/a GLP1 instructions: n/a   Blood Thinner Instructions: n/a Aspirin Instructions: n/a  NPO after midnight  COVID TEST- n/a   Anesthesia review: Yes. Hx of HTN and MS. Pt instructed to hold her Tysabri  injections for 4 weeks. Last dose was January 13th.   Patient denies shortness of breath, fever, cough and chest pain at PAT appointment. Pt denies any respiratory illness/infection in the last two months.   All instructions explained to the patient, with a verbal understanding of the material. Patient agrees to go over the instructions while at home for a better understanding. Patient also instructed to self quarantine after being tested for COVID-19. The opportunity to ask questions was provided.

## 2024-01-18 NOTE — Progress Notes (Signed)
Anesthesia Chart Review:  Case: [id number] Date/Time: 01/20/24 0715   Procedure: BACLOFEN PUMP REPLACEMENT, ABDOMEN (Right)   Anesthesia type: General   Pre-op diagnosis: PRESENCE OF INTRATHECAL BACLOFEN PUMP   Location: [facility] ROOM 20 / [facility]   Surgeons: Dawley, Alan Mulder, DO       DISCUSSION: Patient is a 63 year old female scheduled for the above procedure.  History includes never smoker, HTN, Multiple Sclerosis, GERD, esophageal dysphagia, migraines, Port-a-cath (right internal jugular, 06/24/20). BMI is consistent with obesity.  She was diagnosed with relapsing remitting multiple sclerosis in 2011.  She was initially placed on Avonex but had exacerbation with more issues in her legs and was started on Tysabri in 2016.  She has been followed at [facility] with Dr. Epimenio Foot since 2021. Per 10/30/23 notes by Ihor Austin, NP, JCV antibody normal and MRI brain and c-spine stable in May. She is getting Tysabri infusions vis a central line. She is using a rolling walker for fall prevention, low dose desmopressin at night to help with nocturia, and has a baclofen pump and oral as needed baclofen for spasms. Baclofen pump was nearing need for replacement. Continue as needed Maxalt and increase topiramate for migraines. Six month follow-up planned.   She reported instructions to hold Tysabri injections for 4 weeks prior to surgery, last dose 12/20/23.   Preoperative labs noted. Creatinine 0.90. K 5.3, but hemolysis at this level may affect result. H/H 11.8/37.6. With normal Creatinine, would defer to assigned anesthesiologist if repeat potasium desired perioperatively.     VS: BP 124/81   Pulse 84   Temp 36.8 C   Resp 17   Ht 5\' 3"  (1.6 m)   Wt 89.1 kg   SpO2 99%   BMI 34.79 kg/m    PROVIDERS: Sallyanne Kuster, NP is PCP  Despina Arias, MD is neurologist    LABS: Labs reviewed: Acceptable for surgery. See DISCUSSION. (all labs ordered are listed, but only abnormal  results are displayed)  Labs Reviewed  CBC - Abnormal; Notable for the following components:      Result Value   Hemoglobin 11.8 (*)    nRBC 0.6 (*)    All other components within normal limits  BASIC METABOLIC PANEL - Abnormal; Notable for the following components:   Potassium 5.3 (*)    CO2 19 (*)    Calcium 8.8 (*)    All other components within normal limits  SURGICAL PCR SCREEN     IMAGES: MRI Brain/C-spine 04/25/23:  IMPRESSION: MRI brain: 1.  No evidence of an acute intracranial abnormality. 2. Scattered foci of T2 FLAIR hyperintense signal abnormality within the cerebral white matter. An additional small focus of signal abnormality is questioned within the right aspect of the pons (versus artifact). Findings are compatible with the provided history of multiple sclerosis.   MRI cervical spine: 1. T2 hyperintense lesions within the left aspect of the spinal cord at C2-C3 and within the right aspect of the spinal cord at C3-C4. Findings are compatible with the provided history of multiple sclerosis. 2. Cervical spondylosis as outlined. No more than mild spinal canal narrowing. At C3-C4, uncovertebral hypertrophy and facet arthrosis contribute to moderate right neural foraminal narrowing. Disc degeneration is greatest at C6-C7 (moderate at this level). 3. Grade 1 spondylolisthesis at C2-C3 and C3-C4.    EKG: 01/17/24: Normal sinus rhythm with sinus arrhythmia Normal ECG When compared with ECG of 06-Jan-2023 01:06, No significant change was found Confirmed by End, Cristal Deer [phone])  on 01/17/2024 8:25:40 PM   CV: N/A  Past Medical History:  Diagnosis Date   Anxiety    Constipation    Cystitis bacillary, chronic    Depression    Dysphagia    Dx by Beverely Risen on 10/15/14   Eosinophilic esophagitis 11/19/2014   GE junction benign stricture s/p balloon dilation, retained food contents. ?gastroparesis versus secondary to EE.   Esophageal dysphagia    GERD  (gastroesophageal reflux disease)    Heartburn    Hypertension    Mental depression 2024   Migraine headache    "couple times each month"   Multilevel degenerative disc disease    Multiple sclerosis (HCC) 07/16/2015   Neuromuscular disorder (HCC)    Multiple Sclerosis   Port-A-Cath in place    right side   Presence of intrathecal baclofen pump    right abdomen   Vertigo    Rare - none in last 6 months    Past Surgical History:  Procedure Laterality Date   ABDOMINAL HYSTERECTOMY  2003   baclofen pump insertion Right 01/04/2018   CARPAL TUNNEL RELEASE Right 1997   CESAREAN SECTION  1989   COLONOSCOPY WITH PROPOFOL N/A 10/28/2020   Procedure: COLONOSCOPY WITH PROPOFOL;  Surgeon: [name], MD;  Location: [facility];  Service: Endoscopy;  Laterality: N/A;  PRIORITY 4 port a cath   CYSTECTOMY  2002   From chest   CYSTECTOMY  1969   From Chest   ESOPHAGOGASTRODUODENOSCOPY (EGD) WITH PROPOFOL N/A 05/19/2016   Procedure: ESOPHAGOGASTRODUODENOSCOPY (EGD) WITH PROPOFOL;  Surgeon: [name], MD;  Location: [facility];  Service: Endoscopy;  Laterality: N/A;   FRACTURE SURGERY Left 2014   Fibula Fracture and dislocated ankle screws and plate placed   IR IMAGING GUIDED PORT INSERTION  06/24/2020   KIDNEY DONATION Left 2002   OVARIAN CYST REMOVAL Left 2002   Ovarian mass removal   TONSILLECTOMY  1973   UPPER GASTROINTESTINAL ENDOSCOPY  11/19/2014   Benign appearing esophageal stricture-Dilated.    MEDICATIONS:  baclofen (LIORESAL) 10 MG tablet   buPROPion (WELLBUTRIN) 75 MG tablet   cetirizine (ZYRTEC) 10 MG tablet   Cholecalciferol (VITAMIN D3) 1000 UNITS CAPS   cyanocobalamin (VITAMIN B12) 1000 MCG tablet   desmopressin (DDAVP) 0.1 MG tablet   DULoxetine (CYMBALTA) 20 MG capsule   DULoxetine (CYMBALTA) 60 MG capsule   fluticasone (FLONASE) 50 MCG/ACT nasal spray   hydrOXYzine (ATARAX) 25 MG tablet   natalizumab (TYSABRI) 300 MG/15ML injection    pantoprazole (PROTONIX) 40 MG tablet   polyethylene glycol (MIRALAX / GLYCOLAX) 17 g packet   rizatriptan (MAXALT-MLT) 10 MG disintegrating tablet   topiramate (TOPAMAX) 50 MG tablet   traZODone (DESYREL) 50 MG tablet   No current facility-administered medications for this encounter.    Shonna Chock, PA-C Surgical Short Stay/Anesthesiology [facility] Phone [phone] [facility] Phone [phone] 01/18/2024 2:08 PM

## 2024-01-18 NOTE — Anesthesia Preprocedure Evaluation (Signed)
Anesthesia Evaluation  Patient identified by MRN, date of birth, ID band Patient awake    Reviewed: Allergy & Precautions, NPO status , Patient's Chart, lab work & pertinent test results, reviewed documented beta blocker date and time   History of Anesthesia Complications Negative for: history of anesthetic complications  Airway Mallampati: II  TM Distance: >3 FB     Dental no notable dental hx.    Pulmonary neg COPD, neg PE   breath sounds clear to auscultation       Cardiovascular hypertension, (-) CAD, (-) Past MI, (-) Cardiac Stents and (-) CABG  Rhythm:Regular Rate:Normal     Neuro/Psych  Headaches PSYCHIATRIC DISORDERS Anxiety Depression     Neuromuscular disease (multiple sclerosis)    GI/Hepatic ,GERD  ,,(+) neg Cirrhosis        Endo/Other    Renal/GU Renal disease     Musculoskeletal  (+) Arthritis ,    Abdominal   Peds  Hematology   Anesthesia Other Findings   Reproductive/Obstetrics                             Anesthesia Physical Anesthesia Plan  ASA: 2  Anesthesia Plan: General   Post-op Pain Management:    Induction: Inhalational  PONV Risk Score and Plan: 2 and Ondansetron and Dexamethasone  Airway Management Planned: Oral ETT  Additional Equipment:   Intra-op Plan:   Post-operative Plan: Extubation in OR  Informed Consent: I have reviewed the patients History and Physical, chart, labs and discussed the procedure including the risks, benefits and alternatives for the proposed anesthesia with the patient or authorized representative who has indicated his/her understanding and acceptance.     Dental advisory given  Plan Discussed with: CRNA  Anesthesia Plan Comments: (PAT note written 01/18/2024 by Shonna Chock, PA-C.  )       Anesthesia Quick Evaluation

## 2024-01-20 ENCOUNTER — Ambulatory Visit (HOSPITAL_COMMUNITY)
Admission: RE | Admit: 2024-01-20 | Discharge: 2024-01-20 | Disposition: A | Discharge status: Home / Self Care | Payer: Medicare Other | Attending: Neurological Surgery | Admitting: Neurological Surgery

## 2024-01-20 ENCOUNTER — Ambulatory Visit (HOSPITAL_COMMUNITY): Payer: Medicare Other | Admitting: Physician Assistant

## 2024-01-20 ENCOUNTER — Encounter (HOSPITAL_COMMUNITY): Payer: Self-pay | Admitting: Neurological Surgery

## 2024-01-20 ENCOUNTER — Ambulatory Visit (HOSPITAL_COMMUNITY): Payer: Medicare Other

## 2024-01-20 ENCOUNTER — Encounter (HOSPITAL_COMMUNITY)
Admission: RE | Disposition: A | Discharge status: Home / Self Care | Payer: Self-pay | Source: Home / Self Care | Attending: Neurological Surgery

## 2024-01-20 ENCOUNTER — Ambulatory Visit (HOSPITAL_BASED_OUTPATIENT_CLINIC_OR_DEPARTMENT_OTHER): Payer: Medicare Other | Admitting: Physician Assistant

## 2024-01-20 DIAGNOSIS — Z451 Encounter for adjustment and management of infusion pump: Secondary | ICD-10-CM | POA: Insufficient documentation

## 2024-01-20 DIAGNOSIS — Z978 Presence of other specified devices: Secondary | ICD-10-CM

## 2024-01-20 DIAGNOSIS — G35 Multiple sclerosis: Secondary | ICD-10-CM | POA: Diagnosis not present

## 2024-01-20 DIAGNOSIS — R258 Other abnormal involuntary movements: Secondary | ICD-10-CM | POA: Diagnosis not present

## 2024-01-20 DIAGNOSIS — F32A Depression, unspecified: Secondary | ICD-10-CM | POA: Insufficient documentation

## 2024-01-20 DIAGNOSIS — Z4549 Encounter for adjustment and management of other implanted nervous system device: Secondary | ICD-10-CM | POA: Diagnosis not present

## 2024-01-20 DIAGNOSIS — K219 Gastro-esophageal reflux disease without esophagitis: Secondary | ICD-10-CM | POA: Diagnosis not present

## 2024-01-20 DIAGNOSIS — I10 Essential (primary) hypertension: Secondary | ICD-10-CM

## 2024-01-20 DIAGNOSIS — G43909 Migraine, unspecified, not intractable, without status migrainosus: Secondary | ICD-10-CM | POA: Insufficient documentation

## 2024-01-20 DIAGNOSIS — F418 Other specified anxiety disorders: Secondary | ICD-10-CM | POA: Diagnosis not present

## 2024-01-20 DIAGNOSIS — F419 Anxiety disorder, unspecified: Secondary | ICD-10-CM | POA: Insufficient documentation

## 2024-01-20 DIAGNOSIS — Z79899 Other long term (current) drug therapy: Secondary | ICD-10-CM | POA: Diagnosis not present

## 2024-01-20 HISTORY — PX: PAIN PUMP IMPLANTATION: SHX330

## 2024-01-20 SURGERY — PAIN PUMP INSERTION
Anesthesia: General | Laterality: Right

## 2024-01-20 MED ORDER — LACTATED RINGERS IV SOLN
INTRAVENOUS | Status: DC
Start: 2024-01-20 — End: 2024-01-20

## 2024-01-20 MED ORDER — CEFAZOLIN SODIUM-DEXTROSE 2-4 GM/100ML-% IV SOLN
INTRAVENOUS | Status: AC
  Filled 2024-01-20: qty 100

## 2024-01-20 MED ORDER — OXYCODONE HCL 5 MG PO TABS: 5.0000 mg | ORAL_TABLET | Freq: Four times a day (QID) | ORAL | 0 refills | Status: DC | PRN

## 2024-01-20 MED ORDER — VANCOMYCIN HCL 1000 MG IV SOLR
INTRAVENOUS | Status: AC
  Filled 2024-01-20: qty 20

## 2024-01-20 MED ORDER — BUPIVACAINE-EPINEPHRINE (PF) 0.5% -1:200000 IJ SOLN
INTRAMUSCULAR | Status: AC
  Filled 2024-01-20: qty 30

## 2024-01-20 MED ORDER — PROPOFOL 500 MG/50ML IV EMUL
INTRAVENOUS | Status: DC | PRN
  Administered 2024-01-20: 50 ug/kg/min via INTRAVENOUS

## 2024-01-20 MED ORDER — CHLORHEXIDINE GLUCONATE CLOTH 2 % EX PADS: 6.0000 | MEDICATED_PAD | Freq: Once | CUTANEOUS | Status: DC

## 2024-01-20 MED ORDER — OXYCODONE HCL 5 MG/5ML PO SOLN: 5.0000 mg | Freq: Once | ORAL | Status: DC | PRN

## 2024-01-20 MED ORDER — LIDOCAINE-EPINEPHRINE 1 %-1:100000 IJ SOLN
INTRAMUSCULAR | Status: AC
  Filled 2024-01-20: qty 1

## 2024-01-20 MED ORDER — ACETAMINOPHEN 10 MG/ML IV SOLN: 1000.0000 mg | Freq: Once | INTRAVENOUS | Status: DC | PRN

## 2024-01-20 MED ORDER — ORAL CARE MOUTH RINSE: 15.0000 mL | Freq: Once | OROMUCOSAL | Status: AC

## 2024-01-20 MED ORDER — PROPOFOL 10 MG/ML IV BOLUS
INTRAVENOUS | Status: AC
  Filled 2024-01-20: qty 20

## 2024-01-20 MED ORDER — FENTANYL CITRATE (PF) 100 MCG/2ML IJ SOLN: 25.0000 ug | INTRAMUSCULAR | Status: DC | PRN

## 2024-01-20 MED ORDER — CEFAZOLIN SODIUM-DEXTROSE 2-4 GM/100ML-% IV SOLN
2.0000 g | INTRAVENOUS | Status: AC
  Administered 2024-01-20: 2 g via INTRAVENOUS

## 2024-01-20 MED ORDER — LIDOCAINE-EPINEPHRINE 1 %-1:100000 IJ SOLN
INTRAMUSCULAR | Status: DC | PRN
  Administered 2024-01-20: 20 mL

## 2024-01-20 MED ORDER — ONDANSETRON HCL 4 MG/2ML IJ SOLN
INTRAMUSCULAR | Status: DC | PRN
  Administered 2024-01-20: 4 mg via INTRAVENOUS

## 2024-01-20 MED ORDER — ONDANSETRON HCL 4 MG/2ML IJ SOLN: 4.0000 mg | Freq: Once | INTRAMUSCULAR | Status: DC | PRN

## 2024-01-20 MED ORDER — FENTANYL CITRATE (PF) 250 MCG/5ML IJ SOLN
INTRAMUSCULAR | Status: DC | PRN
  Administered 2024-01-20: 50 ug via INTRAVENOUS
  Administered 2024-01-20: 100 ug via INTRAVENOUS
  Administered 2024-01-20 (×2): 50 ug via INTRAVENOUS

## 2024-01-20 MED ORDER — FENTANYL CITRATE (PF) 250 MCG/5ML IJ SOLN
INTRAMUSCULAR | Status: AC
  Filled 2024-01-20: qty 5

## 2024-01-20 MED ORDER — OXYCODONE HCL 5 MG PO TABS: 5.0000 mg | ORAL_TABLET | Freq: Once | ORAL | Status: DC | PRN

## 2024-01-20 MED ORDER — MIDAZOLAM HCL 2 MG/2ML IJ SOLN
INTRAMUSCULAR | Status: AC
  Filled 2024-01-20: qty 2

## 2024-01-20 MED ORDER — VANCOMYCIN HCL 1000 MG IV SOLR
INTRAVENOUS | Status: DC | PRN
  Administered 2024-01-20: 1000 mg via TOPICAL

## 2024-01-20 MED ORDER — 0.9 % SODIUM CHLORIDE (POUR BTL) OPTIME
TOPICAL | Status: DC | PRN
  Administered 2024-01-20: 1000 mL

## 2024-01-20 MED ORDER — MIDAZOLAM HCL 2 MG/2ML IJ SOLN
INTRAMUSCULAR | Status: DC | PRN
  Administered 2024-01-20: 2 mg via INTRAVENOUS

## 2024-01-20 MED ORDER — BUPIVACAINE-EPINEPHRINE (PF) 0.5% -1:200000 IJ SOLN
INTRAMUSCULAR | Status: DC | PRN
  Administered 2024-01-20: 30 mL

## 2024-01-20 MED ORDER — PROPOFOL 10 MG/ML IV BOLUS
INTRAVENOUS | Status: DC | PRN
  Administered 2024-01-20: 50 mg via INTRAVENOUS

## 2024-01-20 MED ORDER — THROMBIN 5000 UNITS EX SOLR
CUTANEOUS | Status: AC
  Filled 2024-01-20: qty 10000

## 2024-01-20 MED ORDER — PHENYLEPHRINE HCL-NACL 20-0.9 MG/250ML-% IV SOLN
INTRAVENOUS | Status: DC | PRN
  Administered 2024-01-20: 80 ug via INTRAVENOUS

## 2024-01-20 MED ORDER — CHLORHEXIDINE GLUCONATE 0.12 % MT SOLN
OROMUCOSAL | Status: AC
  Administered 2024-01-20: 15 mL via OROMUCOSAL
  Filled 2024-01-20: qty 15

## 2024-01-20 MED ORDER — CHLORHEXIDINE GLUCONATE 0.12 % MT SOLN: 15.0000 mL | Freq: Once | OROMUCOSAL | Status: AC

## 2024-01-20 SURGICAL SUPPLY — 54 items
BAG COUNTER SPONGE SURGICOUNT (BAG) ×1 IMPLANT
BLADE CLIPPER SURG (BLADE) ×1 IMPLANT
CABLE BIPOLOR RESECTION CORD (MISCELLANEOUS) ×1 IMPLANT
CANISTER SUCT 3000ML PPV (MISCELLANEOUS) ×1 IMPLANT
CLAMP SUTURE YELLOW 5 PAIRS (MISCELLANEOUS) ×1 IMPLANT
COVER MAYO STAND STRL (DRAPES) ×1 IMPLANT
DERMABOND ADVANCED .7 DNX12 (GAUZE/BANDAGES/DRESSINGS) ×1 IMPLANT
DRAPE C-ARM 42X72 X-RAY (DRAPES) ×1 IMPLANT
DRAPE LAPAROTOMY 100X72X124 (DRAPES) ×1 IMPLANT
DRSG OPSITE POSTOP 3X4 (GAUZE/BANDAGES/DRESSINGS) IMPLANT
DRSG OPSITE POSTOP 4X6 (GAUZE/BANDAGES/DRESSINGS) IMPLANT
ELECT BLADE INSULATED 4IN (ELECTROSURGICAL) ×1 IMPLANT
ELECT COATED BLADE 2.86 ST (ELECTRODE) ×1 IMPLANT
ELECT REM PT RETURN 9FT ADLT (ELECTROSURGICAL) ×1 IMPLANT
ELECTRODE BLADE INSULATED 4IN (ELECTROSURGICAL) ×1 IMPLANT
ELECTRODE REM PT RTRN 9FT ADLT (ELECTROSURGICAL) ×1 IMPLANT
GAUZE 4X4 16PLY ~~LOC~~+RFID DBL (SPONGE) ×1 IMPLANT
GAUZE PAD ABD 8X10 STRL (GAUZE/BANDAGES/DRESSINGS) IMPLANT
GAUZE SPONGE 4X4 12PLY STRL (GAUZE/BANDAGES/DRESSINGS) IMPLANT
GLOVE BIO SURGEON STRL SZ7 (GLOVE) ×1 IMPLANT
GLOVE BIOGEL PI IND STRL 7.5 (GLOVE) ×1 IMPLANT
GLOVE BIOGEL PI IND STRL 8 (GLOVE) ×1 IMPLANT
GLOVE ECLIPSE 8.0 STRL XLNG CF (GLOVE) ×1 IMPLANT
GLOVE EXAM NITRILE XL STR (GLOVE) IMPLANT
GOWN STRL REUS W/ TWL LRG LVL3 (GOWN DISPOSABLE) IMPLANT
GOWN STRL REUS W/ TWL XL LVL3 (GOWN DISPOSABLE) ×1 IMPLANT
GOWN STRL REUS W/TWL 2XL LVL3 (GOWN DISPOSABLE) IMPLANT
KIT BASIN OR (CUSTOM PROCEDURE TRAY) ×1 IMPLANT
KIT TURNOVER KIT B (KITS) ×1 IMPLANT
NDL HYPO 18GX1.5 BLUNT FILL (NEEDLE) IMPLANT
NDL HYPO 25X1 1.5 SAFETY (NEEDLE) ×1 IMPLANT
NEEDLE HYPO 18GX1.5 BLUNT FILL (NEEDLE) IMPLANT
NEEDLE HYPO 25X1 1.5 SAFETY (NEEDLE) ×1 IMPLANT
NS IRRIG 1000ML POUR BTL (IV SOLUTION) ×1 IMPLANT
PACK LAMINECTOMY NEURO (CUSTOM PROCEDURE TRAY) ×1 IMPLANT
PUMP SYNCHROMED III 20ML RESVR (Neuro Prosthesis/Implant) IMPLANT
SPIKE FLUID TRANSFER (MISCELLANEOUS) ×1 IMPLANT
SPONGE SURGIFOAM ABS GEL SZ50 (HEMOSTASIS) IMPLANT
SPONGE T-LAP 4X18 ~~LOC~~+RFID (SPONGE) ×1 IMPLANT
STAPLER SKIN PROX WIDE 3.9 (STAPLE) IMPLANT
STAPLER VISISTAT (STAPLE) ×1 IMPLANT
SUT PROLENE 2 0 SH DA (SUTURE) IMPLANT
SUT PROLENE 3 0 PS 2 (SUTURE) ×1 IMPLANT
SUT SILK 2 0 TIES 10X30 (SUTURE) ×1 IMPLANT
SUT VIC AB 0 CT1 18XCR BRD8 (SUTURE) ×2 IMPLANT
SUT VIC AB 2-0 CP2 18 (SUTURE) ×2 IMPLANT
SUT VIC AB 3-0 SH 8-18 (SUTURE) ×3 IMPLANT
SYR 20CC LL (SYRINGE) ×1 IMPLANT
SYR 20ML LL LF (SYRINGE) IMPLANT
SYR 3ML LL SCALE MARK (SYRINGE) ×1 IMPLANT
TAG SUTURE CLAMP YLW 5PR (MISCELLANEOUS) ×1 IMPLANT
TOWEL GREEN STERILE (TOWEL DISPOSABLE) ×1 IMPLANT
TOWEL GREEN STERILE FF (TOWEL DISPOSABLE) ×1 IMPLANT
WATER STERILE IRR 1000ML POUR (IV SOLUTION) ×1 IMPLANT

## 2024-01-20 NOTE — Anesthesia Procedure Notes (Signed)
Procedure Name: LMA Insertion Date/Time: 01/20/2024 8:05 AM  Performed by: Hali Marry, CRNAPre-anesthesia Checklist: Patient identified, Emergency Drugs available, Suction available and Patient being monitored Patient Re-evaluated:Patient Re-evaluated prior to induction Oxygen Delivery Method: Circle system utilized Preoxygenation: Pre-oxygenation with 100% oxygen Induction Type: IV induction LMA: LMA inserted LMA Size: 4.0 Number of attempts: 1 Placement Confirmation: positive ETCO2 and breath sounds checked- equal and bilateral Tube secured with: Tape Dental Injury: Teeth and Oropharynx as per pre-operative assessment

## 2024-01-20 NOTE — H&P (Signed)
Providing Compassionate, Quality Care - Together  NEUROSURGERY HISTORY & PHYSICAL   Rose Clements is an 63 y.o. female.   Chief Complaint: Baclofen pump, end of battery life HPI: This is a 63 year old female with a history of multiple sclerosis with spasticity, with baclofen pump for spasticity.  She presents today for battery replacement due to end of battery life.  She has no complaints at this time.  Her last Tysabri infusion was 1 month ago.  Past Medical History:  Diagnosis Date   Anxiety    Constipation    Cystitis bacillary, chronic    Depression    Dysphagia    Dx by Beverely Risen on 10/15/14   Eosinophilic esophagitis 11/19/2014   GE junction benign stricture s/p balloon dilation, retained food contents. ?gastroparesis versus secondary to EE.   Esophageal dysphagia    GERD (gastroesophageal reflux disease)    Heartburn    Hypertension    Mental depression 2024   Migraine headache    "couple times each month"   Multilevel degenerative disc disease    Multiple sclerosis (HCC) 07/16/2015   Neuromuscular disorder (HCC)    Multiple Sclerosis   Port-A-Cath in place    right side   Presence of intrathecal baclofen pump    right abdomen   Vertigo    Rare - none in last 6 months    Past Surgical History:  Procedure Laterality Date   ABDOMINAL HYSTERECTOMY  2003   baclofen pump insertion Right 01/04/2018   CARPAL TUNNEL RELEASE Right 1997   CESAREAN SECTION  1989   COLONOSCOPY WITH PROPOFOL N/A 10/28/2020   Procedure: COLONOSCOPY WITH PROPOFOL;  Surgeon: [name], MD;  Location: [facility];  Service: Endoscopy;  Laterality: N/A;  PRIORITY 4 port a cath   CYSTECTOMY  2002   From chest   CYSTECTOMY  1969   From Chest   ESOPHAGOGASTRODUODENOSCOPY (EGD) WITH PROPOFOL N/A 05/19/2016   Procedure: ESOPHAGOGASTRODUODENOSCOPY (EGD) WITH PROPOFOL;  Surgeon: [name], MD;  Location: [facility];  Service: Endoscopy;  Laterality: N/A;   FRACTURE SURGERY  Left 2014   Fibula Fracture and dislocated ankle screws and plate placed   IR IMAGING GUIDED PORT INSERTION  06/24/2020   KIDNEY DONATION Left 2002   OVARIAN CYST REMOVAL Left 2002   Ovarian mass removal   TONSILLECTOMY  1973   UPPER GASTROINTESTINAL ENDOSCOPY  11/19/2014   Benign appearing esophageal stricture-Dilated.    Family History  Problem Relation Age of Onset   Diabetes Mother    Hypertension Mother    Heart disease Mother    Pulmonary embolism Mother    Diabetes Sister    Hypertension Sister    Heart disease Sister    Bipolar disorder Sister    Narcolepsy Brother    Dementia Maternal Aunt    Breast cancer Maternal Aunt    Diabetes Maternal Grandmother    Heart attack Maternal Grandmother    Diabetes Maternal Grandfather    Pancreatic cancer Maternal Grandfather    Social History:  reports that she has never smoked. She has never used smokeless tobacco. She reports that she does not drink alcohol and does not use drugs.  Allergies:  Allergies  Allergen Reactions   Lisinopril Hives    Medications Prior to Admission  Medication Sig Dispense Refill   baclofen (LIORESAL) 10 MG tablet TAKE 1 TABLET(10 MG) BY MOUTH THREE TIMES DAILY AS NEEDED FOR MUSCLE SPASMS 270 tablet 0   buPROPion (WELLBUTRIN) 75 MG tablet Take  1 tablet (75 mg total) by mouth in the morning. 90 tablet 1   cetirizine (ZYRTEC) 10 MG tablet Take 10 mg by mouth daily.     Cholecalciferol (VITAMIN D3) 1000 UNITS CAPS Take 1,000 Units by mouth in the morning and at bedtime.      cyanocobalamin (VITAMIN B12) 1000 MCG tablet Take 1,000 mcg by mouth daily.     desmopressin (DDAVP) 0.1 MG tablet TAKE 1 TABLET BY MOUTH EVERY DAY 90 tablet 2   DULoxetine (CYMBALTA) 20 MG capsule Take 1 capsule (20 mg total) by mouth daily. Take along with 60 mg daily 90 capsule 1   DULoxetine (CYMBALTA) 60 MG capsule TAKE 1 CAPSULE BY MOUTH DAILY AFTER SUPPER. TAKE WITH 20 MG DOSE FOR TOTAL OF 80 MG DAILY. 90 capsule 0    fluticasone (FLONASE) 50 MCG/ACT nasal spray SHAKE LIQUID AND USE 2 SPRAYS IN EACH NOSTRIL EVERY DAY 48 g 3   hydrOXYzine (ATARAX) 25 MG tablet Take 1 tablet (25 mg total) by mouth 3 (three) times daily as needed for anxiety. 90 tablet 2   natalizumab (TYSABRI) 300 MG/15ML injection Inject 300 mg into the vein every 30 (thirty) days.      pantoprazole (PROTONIX) 40 MG tablet TAKE 1 TABLET(40 MG) BY MOUTH DAILY 90 tablet 1   polyethylene glycol (MIRALAX / GLYCOLAX) 17 g packet Take 17 g by mouth daily. 28 each 1   rizatriptan (MAXALT-MLT) 10 MG disintegrating tablet Take 1 tablet (10 mg total) by mouth as needed for migraine. May repeat in 2 hours if needed 12 tablet 11   topiramate (TOPAMAX) 50 MG tablet Take 1 tablet (50 mg total) by mouth daily AND 2 tablets (100 mg total) at bedtime. 90 tablet 11   traZODone (DESYREL) 50 MG tablet Take 0.5-1 tablets (25-50 mg total) by mouth at bedtime as needed for sleep. 30 tablet 1    No results found for this or any previous visit (from the past 48 hours). No results found.  ROS All pertinent positives and negatives are listed HPI above  Blood pressure 134/85, pulse 68, temperature 98.2 F (36.8 C), temperature source Oral, resp. rate 17, height 5\' 3"  (1.6 m), weight 88 kg, SpO2 100%. Physical Exam  Awake alert oriented x 3, no acute distress PERRLA Cranial nerves II through XII intact Moves all extremities equally Speech fluent appropriate Nonlabored breathing  Assessment/Plan 63 year old female with  End of battery life, baclofen pump  -OR today for replacement of abdominal baclofen pump.  We discussed all risks, benefits and expected outcomes as well as alternatives to treatment.  Informed consent was obtained and witnessed.  Thank you for allowing me to participate in this patient's care.  Please do not hesitate to call with questions or concerns.   Monia Pouch, DO Neurosurgeon [facility] [phone]

## 2024-01-20 NOTE — Op Note (Signed)
   Providing Compassionate, Quality Care - Together  Date of service: 01/20/2024  PREOP DIAGNOSIS:  Baclofen pump, end of battery life  POSTOP DIAGNOSIS: Same  PROCEDURE: Replacement of abdominal intrathecal baclofen pump, Medtronic SynchroMed 2, 20 cc  SURGEON: Dr. Kendell Bane C. Dalene Robards, DO  ASSISTANT: None  ANESTHESIA: LMA  EBL: Minimal  SPECIMENS: None  DRAINS: None  COMPLICATIONS: None  CONDITION: Hemodynamically stable  HISTORY: Rose Clements is a 63 y.o. female with a multiple sclerosis with spasticity, with intrathecal baclofen pump.  Her baclofen pump was at end of battery life therefore presented for replacement.  We discussed all risks, benefits and expected outcomes as well as alternatives to treatment.  Informed consent was obtained and witnessed.  PROCEDURE IN DETAIL: The patient was brought to the operating room. After induction of general anesthesia, the patient was positioned on the operative table in the supine position. All pressure points were meticulously padded. Skin incision was then marked out and prepped and draped in the usual sterile fashion. Physician driven timeout was performed.  Local anesthetic was injected into the planned incision.  Previous incision was opened sharply with 15 blade down to the soft tissues.  Using Bovie electrocautery, encountering and cleared the baclofen pump from surrounding scar tissue and attachments.  Identified the catheter and this was protected.  The catheter was accessed with a needle at the catheter access points, and 2 cc of clear fluid were removed without difficulty.  The pump was then gently extricated from the pocket without difficulty.  This was then disconnected.  Utilizing sterile technique on the back table, the prior baclofen pump was emptied from the medication and the new pump was refilled with the same medication.  This was done without difficulty.  The new pump was then connected.  2-0 Prolene sutures were used in  the corners of the pocket to secure the device to its anchors.  The pump was then gently placed in surgical position, the catheter was circumferentially checked to noted to be deep to the pump.  Vancomycin powder was then placed in the wound.  The wound was then closed in layers, 2-0 and 3-0 Vicryl sutures for dermis.  Skin was closed with skin glue.  Sterile dressing applied.  At the end of the case all sponge, needle, and instrument counts were correct. The patient was then transferred to the stretcher, extubated, and taken to the post-anesthesia care unit in stable hemodynamic condition.

## 2024-01-20 NOTE — Discharge Instructions (Addendum)
Use abdominal binder majority of the day, okay to remove to shower Can shower in 3 days, no submerging wound until seen in the office  NO TYSABRI INFUSION UNTIL SEEN AND CLEARED BY SURGEON

## 2024-01-20 NOTE — Transfer of Care (Signed)
Immediate Anesthesia Transfer of Care Note  Patient: Rose Clements  Procedure(s) Performed: BACLOFEN PUMP REPLACEMENT, ABDOMEN (Right)  Patient Location: PACU  Anesthesia Type:General  Level of Consciousness: awake, alert , and oriented  Airway & Oxygen Therapy: Patient Spontanous Breathing  Post-op Assessment: Report given to RN and Post -op Vital signs reviewed and stable  Post vital signs: Reviewed and stable  Last Vitals:  Vitals Value Taken Time  BP 139/76 01/20/24 0930  Temp    Pulse 84 01/20/24 0933  Resp 13 01/20/24 0933  SpO2 95 % 01/20/24 0933  Vitals shown include unfiled device data.  Last Pain:  Vitals:   01/20/24 0603  TempSrc:   PainSc: 5       Patients Stated Pain Goal: 3 (01/20/24 0603)  Complications: No notable events documented.

## 2024-01-20 NOTE — Progress Notes (Signed)
Dr. Jean Rosenthal made aware of potassium 5.3 at PAT. No new orders at this time.

## 2024-01-20 NOTE — Anesthesia Postprocedure Evaluation (Signed)
Anesthesia Post Note  Patient: Rose Clements  Procedure(s) Performed: BACLOFEN PUMP REPLACEMENT, ABDOMEN (Right)     Patient location during evaluation: PACU Anesthesia Type: General Level of consciousness: awake and alert Pain management: pain level controlled Vital Signs Assessment: post-procedure vital signs reviewed and stable Respiratory status: spontaneous breathing, nonlabored ventilation, respiratory function stable and patient connected to nasal cannula oxygen Cardiovascular status: blood pressure returned to baseline and stable Postop Assessment: no apparent nausea or vomiting Anesthetic complications: no   No notable events documented.  Last Vitals:  Vitals:   01/20/24 0945 01/20/24 1000  BP: (!) 133/93 132/81  Pulse: 77 66  Resp: 12 13  Temp:  36.7 C  SpO2: 92% 97%    Last Pain:  Vitals:   01/20/24 1000  TempSrc:   PainSc: 0-No pain                 Mariann Barter

## 2024-01-22 DIAGNOSIS — G35 Multiple sclerosis: Secondary | ICD-10-CM | POA: Diagnosis not present

## 2024-01-24 ENCOUNTER — Encounter (HOSPITAL_COMMUNITY): Payer: Self-pay | Admitting: Neurological Surgery

## 2024-01-27 ENCOUNTER — Telehealth: Payer: Self-pay | Admitting: *Deleted

## 2024-01-27 NOTE — Telephone Encounter (Signed)
Faxed signed order back to [facility] at [phone]. Received fax confirmation.

## 2024-01-29 DIAGNOSIS — G35 Multiple sclerosis: Secondary | ICD-10-CM | POA: Diagnosis not present

## 2024-02-05 DIAGNOSIS — G35 Multiple sclerosis: Secondary | ICD-10-CM | POA: Diagnosis not present

## 2024-02-09 ENCOUNTER — Telehealth: Payer: Self-pay | Admitting: *Deleted

## 2024-02-09 NOTE — Telephone Encounter (Signed)
 Faxed signed care plan back to [facility] at [phone]. Received fax confirmation.

## 2024-02-12 DIAGNOSIS — G35 Multiple sclerosis: Secondary | ICD-10-CM | POA: Diagnosis not present

## 2024-02-16 DIAGNOSIS — G35 Multiple sclerosis: Secondary | ICD-10-CM | POA: Diagnosis not present

## 2024-02-16 DIAGNOSIS — M6249 Contracture of muscle, multiple sites: Secondary | ICD-10-CM | POA: Diagnosis not present

## 2024-02-19 DIAGNOSIS — G35 Multiple sclerosis: Secondary | ICD-10-CM | POA: Diagnosis not present

## 2024-02-21 DIAGNOSIS — G35 Multiple sclerosis: Secondary | ICD-10-CM | POA: Diagnosis not present

## 2024-02-23 ENCOUNTER — Other Ambulatory Visit: Payer: Self-pay

## 2024-02-23 ENCOUNTER — Encounter: Payer: Self-pay | Admitting: Psychiatry

## 2024-02-23 ENCOUNTER — Ambulatory Visit: Payer: Medicare Other | Admitting: Psychiatry

## 2024-02-23 VITALS — BP 123/84 | HR 68 | Temp 97.6°F | Ht 63.0 in | Wt 187.4 lb

## 2024-02-23 DIAGNOSIS — G4701 Insomnia due to medical condition: Secondary | ICD-10-CM | POA: Diagnosis not present

## 2024-02-23 DIAGNOSIS — F3342 Major depressive disorder, recurrent, in full remission: Secondary | ICD-10-CM | POA: Diagnosis not present

## 2024-02-23 MED ORDER — DULOXETINE HCL 20 MG PO CPEP: 20.0000 mg | ORAL_CAPSULE | Freq: Every day | ORAL | 1 refills | Status: DC

## 2024-02-23 MED ORDER — BUPROPION HCL 75 MG PO TABS: 75.0000 mg | ORAL_TABLET | Freq: Every morning | ORAL | 1 refills | Status: DC

## 2024-02-23 NOTE — Progress Notes (Unsigned)
 [facility] MD OP Progress Note  02/23/2024 11:26 AM Rose Clements  MRN:  [id number]  Chief Complaint:  Chief Complaint  Patient presents with   Follow-up   Depression   Insomnia   Medication Refill   HPI: Rose Clements is a 63 year old African-American female who is married, on SSD, lives in [place], has a history of MDD, multiple sclerosis on intrathecal baclofen pump, migraine headaches, hypertension was evaluated in office today.  She has a history of major depression and insomnia. Her mood has improved compared to before, when she had thoughts of self-harm. She is coping better and is thankful for her current state. She continues to see her therapist, Inetta Fermo, twice a month.  current thoughts of self-harm or harm to others.  She experiences disrupted sleep patterns, sleeping during the day and staying awake at night, which started before Christmas. She attributes this to boredom and being home alone.  She does have trazodone available for sleep however she has not been compliant.  She is currently taking duloxetine 60 mg in the evening and 20 mg in the morning, Wellbutrin 75 mg. She has hydroxyzine available as needed.  Her home situation is stressful due to her cousin staying with her and her husband's desire to move to [place] to care for his elderly mother. She has expressed her reluctance to move due to the stress and responsibilities involved. Her husband's mother recently had a health scare, leading to hospitalization due to constipation and malnourishment. She assisted in her care during this time, which was challenging for her.  She underwent a baclofen pump replacement surgery on January 20, 2024, as it was overdue for a change. The pump is functioning well, although she continues to experience stiffness and numbness. The dosage has been increased slightly to manage symptoms without causing excessive muscle relaxation.  She is currently compliant on her medications as prescribed.  Denies  any side effects.  Denies any suicidality, homicidality or perceptual disturbances.    Visit Diagnosis:    ICD-10-CM   1. MDD (major depressive disorder), recurrent, in full remission (HCC)  F33.42 buPROPion (WELLBUTRIN) 75 MG tablet    2. Insomnia due to medical condition  G47.01 DULoxetine (CYMBALTA) 20 MG capsule   mood, MS      Past Psychiatric History: I have reviewed past psychiatric history from progress note on 03/18/2023.  Past inpatient admission-01/07/2023 - 01/10/2023 after a suicide attempt.  Past trials of medications-multiple including modafinil for sleep problems.  Past Medical History:  Past Medical History:  Diagnosis Date   Anxiety    Constipation    Cystitis bacillary, chronic    Depression    Dysphagia    Dx by Beverely Risen on 10/15/14   Eosinophilic esophagitis 11/19/2014   GE junction benign stricture s/p balloon dilation, retained food contents. ?gastroparesis versus secondary to EE.   Esophageal dysphagia    GERD (gastroesophageal reflux disease)    Heartburn    Hypertension    Mental depression 2024   Migraine headache    "couple times each month"   Multilevel degenerative disc disease    Multiple sclerosis (HCC) 07/16/2015   Neuromuscular disorder (HCC)    Multiple Sclerosis   Port-A-Cath in place    right side   Presence of intrathecal baclofen pump    right abdomen   Vertigo    Rare - none in last 6 months    Past Surgical History:  Procedure Laterality Date   ABDOMINAL HYSTERECTOMY  2003   baclofen pump  insertion Right 01/04/2018   CARPAL TUNNEL RELEASE Right 1997   CESAREAN SECTION  1989   COLONOSCOPY WITH PROPOFOL N/A 10/28/2020   Procedure: COLONOSCOPY WITH PROPOFOL;  Surgeon: [name], MD;  Location: [facility];  Service: Endoscopy;  Laterality: N/A;  PRIORITY 4 port a cath   CYSTECTOMY  2002   From chest   CYSTECTOMY  1969   From Chest   ESOPHAGOGASTRODUODENOSCOPY (EGD) WITH PROPOFOL N/A 05/19/2016   Procedure:  ESOPHAGOGASTRODUODENOSCOPY (EGD) WITH PROPOFOL;  Surgeon: [name], MD;  Location: [facility];  Service: Endoscopy;  Laterality: N/A;   FRACTURE SURGERY Left 2014   Fibula Fracture and dislocated ankle screws and plate placed   IR IMAGING GUIDED PORT INSERTION  06/24/2020   KIDNEY DONATION Left 2002   OVARIAN CYST REMOVAL Left 2002   Ovarian mass removal   PAIN PUMP IMPLANTATION Right 01/20/2024   Procedure: BACLOFEN PUMP REPLACEMENT, ABDOMEN;  Surgeon: Bethann Goo, DO;  Location: [facility];  Service: Neurosurgery;  Laterality: Right;   TONSILLECTOMY  1973   UPPER GASTROINTESTINAL ENDOSCOPY  11/19/2014   Benign appearing esophageal stricture-Dilated.    Family Psychiatric History: I have reviewed family psychiatric history from progress note on 03/18/2023.  Family History:  Family History  Problem Relation Age of Onset   Diabetes Mother    Hypertension Mother    Heart disease Mother    Pulmonary embolism Mother    Diabetes Sister    Hypertension Sister    Heart disease Sister    Bipolar disorder Sister    Narcolepsy Brother    Dementia Maternal Aunt    Breast cancer Maternal Aunt    Diabetes Maternal Grandmother    Heart attack Maternal Grandmother    Diabetes Maternal Grandfather    Pancreatic cancer Maternal Grandfather     Social History: I have reviewed social history from progress note on 03/18/2023. Social History   Socioeconomic History   Marital status: Married    Spouse name: [name]   Number of children: 1   Years of education: Not on file   Highest education level: Some college, no degree  Occupational History   Occupation: Disability  Tobacco Use   Smoking status: Never   Smokeless tobacco: Never  Vaping Use   Vaping status: Never Used  Substance and Sexual Activity   Alcohol use: No    Alcohol/week: 0.0 standard drinks of alcohol    Comment: rare wine on Holidays   Drug use: No   Sexual activity: Not Currently  Other Topics Concern   Not on  file  Social History Narrative   Lives with husband, Rose Clements   Right handed    Caffeine use: tea sometimes (mostly herbal)   Social Drivers of Health   Financial Resource Strain: Low Risk  (05/15/2021)   Overall Financial Resource Strain (CARDIA)    Difficulty of Paying Living Expenses: Not very hard  Food Insecurity: No Food Insecurity (01/06/2023)   Hunger Vital Sign    Worried About Running Out of Food in the Last Year: Never true    Ran Out of Food in the Last Year: Never true  Transportation Needs: No Transportation Needs (01/06/2023)   PRAPARE - Administrator, Civil Service (Medical): No    Lack of Transportation (Non-Medical): No  Physical Activity: Not on file  Stress: Not on file  Social Connections: Not on file    Allergies:  Allergies  Allergen Reactions   Lisinopril Hives    Metabolic  Disorder Labs: No results found for: "HGBA1C", "MPG" No results found for: "PROLACTIN" Lab Results  Component Value Date   CHOL 267 (H) 03/31/2022   TRIG 61 03/31/2022   HDL 80 03/31/2022   CHOLHDL 3.3 03/31/2022   LDLCALC 178 (H) 03/31/2022   LDLCALC 144 (H) 02/07/2020   Lab Results  Component Value Date   TSH 2.320 03/31/2022   TSH 1.350 02/07/2020    Therapeutic Level Labs: No results found for: "LITHIUM" No results found for: "VALPROATE" No results found for: "CBMZ"  Current Medications: Current Outpatient Medications  Medication Sig Dispense Refill   baclofen (LIORESAL) 10 MG tablet TAKE 1 TABLET(10 MG) BY MOUTH THREE TIMES DAILY AS NEEDED FOR MUSCLE SPASMS 270 tablet 0   cetirizine (ZYRTEC) 10 MG tablet Take 10 mg by mouth daily.     Cholecalciferol (VITAMIN D3) 1000 UNITS CAPS Take 1,000 Units by mouth in the morning and at bedtime.      cyanocobalamin (VITAMIN B12) 1000 MCG tablet Take 1,000 mcg by mouth daily.     desmopressin (DDAVP) 0.1 MG tablet TAKE 1 TABLET BY MOUTH EVERY DAY 90 tablet 2   DULoxetine (CYMBALTA) 60 MG capsule TAKE 1  CAPSULE BY MOUTH DAILY AFTER SUPPER. TAKE WITH 20 MG DOSE FOR TOTAL OF 80 MG DAILY. 90 capsule 0   fluticasone (FLONASE) 50 MCG/ACT nasal spray SHAKE LIQUID AND USE 2 SPRAYS IN EACH NOSTRIL EVERY DAY 48 g 3   hydrOXYzine (ATARAX) 25 MG tablet Take 1 tablet (25 mg total) by mouth 3 (three) times daily as needed for anxiety. 90 tablet 2   natalizumab (TYSABRI) 300 MG/15ML injection Inject 300 mg into the vein every 30 (thirty) days.      oxyCODONE (OXY IR/ROXICODONE) 5 MG immediate release tablet Take 1 tablet (5 mg total) by mouth every 6 (six) hours as needed for severe pain (pain score 7-10). 20 tablet 0   pantoprazole (PROTONIX) 40 MG tablet TAKE 1 TABLET(40 MG) BY MOUTH DAILY 90 tablet 1   polyethylene glycol (MIRALAX / GLYCOLAX) 17 g packet Take 17 g by mouth daily. 28 each 1   rizatriptan (MAXALT-MLT) 10 MG disintegrating tablet Take 1 tablet (10 mg total) by mouth as needed for migraine. May repeat in 2 hours if needed 12 tablet 11   topiramate (TOPAMAX) 50 MG tablet Take 1 tablet (50 mg total) by mouth daily AND 2 tablets (100 mg total) at bedtime. 90 tablet 11   traZODone (DESYREL) 50 MG tablet Take 0.5-1 tablets (25-50 mg total) by mouth at bedtime as needed for sleep. 30 tablet 1   buPROPion (WELLBUTRIN) 75 MG tablet Take 1 tablet (75 mg total) by mouth in the morning. 90 tablet 1   DULoxetine (CYMBALTA) 20 MG capsule Take 1 capsule (20 mg total) by mouth daily. Take along with 60 mg daily 90 capsule 1   No current facility-administered medications for this visit.     Musculoskeletal: Strength & Muscle Tone:  baseline Gait & Station:  walks with walker Patient leans: N/A  Psychiatric Specialty Exam: Review of Systems  Psychiatric/Behavioral:  Positive for sleep disturbance.     Blood pressure 123/84, pulse 68, temperature 97.6 F (36.4 C), temperature source Skin, height 5\' 3"  (1.6 m), weight 187 lb 6.4 oz (85 kg).Body mass index is 33.2 kg/m.  General Appearance: Casual  Eye  Contact:  Fair  Speech:  Clear and Coherent  Volume:  Normal  Mood:  Euthymic  Affect:  Appropriate  Thought Process:  Goal Directed and Descriptions of Associations: Intact  Orientation:  Full (Time, Place, and Person)  Thought Content: Logical   Suicidal Thoughts:  No  Homicidal Thoughts:  No  Memory:  Immediate;   Fair Recent;   Fair Remote;   Fair  Judgement:  Fair  Insight:  Fair  Psychomotor Activity:  Normal  Concentration:  Concentration: Fair and Attention Span: Fair  Recall:  [employer] of Knowledge: Fair  Language: Fair  Akathisia:  No  Handed:  Right  AIMS (if indicated): not done  Assets:  Desire for Improvement Housing Social Support  ADL's:  Intact  Cognition: WNL  Sleep:   Excessive during the day and poor at night   Screenings: AUDIT    Flowsheet Row Admission (Discharged) from 01/06/2023 in [facility]  Alcohol Use Disorder Identification Test Final Score (AUDIT) 0      GAD-7    Flowsheet Row Office Visit from 02/23/2024 in [facility] Office Visit from 08/25/2023 in [facility] Office Visit from 06/30/2023 in [facility] Office Visit from 04/28/2023 in [facility] Office Visit from 03/18/2023 in [facility]  Total GAD-7 Score 4 4 6 9 10       Mini-Mental    Flowsheet Row Office Visit from 04/01/2023 in [facility] Office Visit from 03/26/2022 in [facility] from 03/03/2021 in [facility]  Total Score (max 30 points ) 30 30 30       PHQ2-9    Flowsheet Row Office Visit from 02/23/2024 in [facility] Office Visit from 11/25/2023 in [facility] Office Visit from 08/25/2023 in [facility] Office Visit from 06/30/2023 in [facility] Office Visit from 04/28/2023 in [facility]  PHQ-2 Total Score 2 1 2 4 1   PHQ-9 Total Score 9 -- 10 16 9       Flowsheet Row Office Visit from 02/23/2024 in [facility] Admission (Discharged) from 01/20/2024 in [place] [facility] Office Visit from 11/25/2023 in [facility]  C-SSRS RISK CATEGORY Moderate Risk No Risk Moderate Risk        Assessment and Plan: Clarisa Danser is a 63 year old African-American female who is currently struggling with sleep problems, discussed assessment and plan as noted below.   Major depressive disorder-in remission Major depressive disorder with improved mood symptoms and no current suicidal ideation. She is coping better with stressors, including family-related stress, and expressed gratitude for not acting on past suicidal thoughts. - Continue Duloxetine 60 mg in the evening and 20 mg in the morning, - Continue Wellbutrin 75 mg - Continue Hydroxyzine 25 mg 3 times a day as needed - Continue psychotherapy sessions with Inetta Fermo twice a month  Insomnia-unstable Insomnia characterized by nocturnal wakefulness and daytime sleep, likely due to boredom and inactivity. Emphasis on improving sleep hygiene. - Continue Trazodone 25-50 mg at bedtime, encouraged to use it regularly. - Implement sleep hygiene practices: avoid naps or limit them to 30-45 minutes before 2:30 PM  Follow-up Follow-up appointment to be scheduled in three months to monitor psychiatric symptoms and medication management. - Schedule follow-up appointment in three months  Collaboration of Care: Collaboration of Care: Referral or follow-up with counselor/therapist AEB continue psychotherapy  sessions with Ms. Felecia Jan.  Patient/Guardian was advised Release of Information must be obtained prior to any record release in order to collaborate their care with an outside provider. Patient/Guardian was advised if they have not already done so to contact the registration department to sign all necessary forms in order for [place] to release information regarding their care.   Consent: Patient/Guardian gives verbal consent for treatment and assignment of benefits for services provided during this visit. Patient/Guardian expressed understanding and agreed to proceed.  Discussed the use of a AI scribe software for clinical note transcription with the patient, who gave verbal consent to proceed.  This note was generated in part or whole with voice recognition software. Voice recognition is usually quite accurate but there are transcription errors that can and very often do occur. I apologize for any typographical errors that were not detected and corrected.     Jomarie Longs, MD 02/24/2024, 9:01 AM

## 2024-02-26 DIAGNOSIS — G35 Multiple sclerosis: Secondary | ICD-10-CM | POA: Diagnosis not present

## 2024-03-04 DIAGNOSIS — G35 Multiple sclerosis: Secondary | ICD-10-CM | POA: Diagnosis not present

## 2024-03-07 DIAGNOSIS — G35 Multiple sclerosis: Secondary | ICD-10-CM | POA: Diagnosis not present

## 2024-03-09 ENCOUNTER — Other Ambulatory Visit: Payer: Self-pay | Admitting: Psychiatry

## 2024-03-09 DIAGNOSIS — Z630 Problems in relationship with spouse or partner: Secondary | ICD-10-CM

## 2024-03-09 MED ORDER — DULOXETINE HCL 60 MG PO CPEP: 60.0000 mg | ORAL_CAPSULE | Freq: Every day | ORAL | 1 refills | Status: DC

## 2024-03-09 NOTE — Telephone Encounter (Signed)
 I have sent duloxetine 60 mg to pharmacy.

## 2024-03-14 DIAGNOSIS — G35 Multiple sclerosis: Secondary | ICD-10-CM | POA: Diagnosis not present

## 2024-03-20 DIAGNOSIS — G35 Multiple sclerosis: Secondary | ICD-10-CM | POA: Diagnosis not present

## 2024-03-21 DIAGNOSIS — G35 Multiple sclerosis: Secondary | ICD-10-CM | POA: Diagnosis not present

## 2024-03-28 ENCOUNTER — Telehealth: Payer: Self-pay | Admitting: Nurse Practitioner

## 2024-03-28 DIAGNOSIS — G35 Multiple sclerosis: Secondary | ICD-10-CM | POA: Diagnosis not present

## 2024-03-28 NOTE — Telephone Encounter (Signed)
 Left vm and sent mychart message to confirm 04/04/24 appointment-Toni

## 2024-04-04 ENCOUNTER — Encounter: Payer: Self-pay | Admitting: Nurse Practitioner

## 2024-04-04 ENCOUNTER — Ambulatory Visit (INDEPENDENT_AMBULATORY_CARE_PROVIDER_SITE_OTHER): Payer: Medicare Other | Admitting: Nurse Practitioner

## 2024-04-04 VITALS — BP 124/81 | HR 78 | Temp 98.4°F | Resp 16 | Ht 63.0 in | Wt 187.2 lb

## 2024-04-04 DIAGNOSIS — G35 Multiple sclerosis: Secondary | ICD-10-CM

## 2024-04-04 DIAGNOSIS — K219 Gastro-esophageal reflux disease without esophagitis: Secondary | ICD-10-CM

## 2024-04-04 DIAGNOSIS — Z905 Acquired absence of kidney: Secondary | ICD-10-CM

## 2024-04-04 DIAGNOSIS — E538 Deficiency of other specified B group vitamins: Secondary | ICD-10-CM

## 2024-04-04 DIAGNOSIS — E782 Mixed hyperlipidemia: Secondary | ICD-10-CM

## 2024-04-04 DIAGNOSIS — Z0000 Encounter for general adult medical examination without abnormal findings: Secondary | ICD-10-CM

## 2024-04-04 DIAGNOSIS — J301 Allergic rhinitis due to pollen: Secondary | ICD-10-CM

## 2024-04-04 DIAGNOSIS — F331 Major depressive disorder, recurrent, moderate: Secondary | ICD-10-CM

## 2024-04-04 MED ORDER — PANTOPRAZOLE SODIUM 40 MG PO TBEC: 40.0000 mg | DELAYED_RELEASE_TABLET | Freq: Every day | ORAL | 1 refills | Status: DC

## 2024-04-04 MED ORDER — FLUTICASONE PROPIONATE 50 MCG/ACT NA SUSP: NASAL | 3 refills | Status: AC

## 2024-04-04 NOTE — Progress Notes (Signed)
 [facility] [address] [place], [place] [address]  Internal MEDICINE  Office Visit Note  Patient Name: Rose Clements  [id number]  [id number]  Date of Service: 04/04/2024  Chief Complaint  Patient presents with   Depression   Gastroesophageal Reflux   Hypertension   Medicare Wellness    HPI Latusha presents for a medicare annual well visit.  Well-appearing 63 y.o. female with multiple sclerosis, migraines, eosinophilic esophagitis, GERD, insomnia, fatigue, and depression  Routine CRC screening:  due in 2031 Routine mammogram: due in October this year  Labs: some recent labs done. No recent cholesterol panel New or worsening pain: chronic pain  Other concerns: has a home infusion pump for baclofen  from neurology for her multiple sclerosis.  Has RSV and shingles vaccines done in 2023.  Received prevnar 20 vaccine done on 10/25/2023     04/04/2024    8:57 AM 04/01/2023   11:02 AM 03/26/2022   10:15 AM  MMSE - Mini Mental State Exam  Orientation to time 5 5 5   Orientation to Place 5 5 5   Registration 3 3 3   Attention/ Calculation 5 5 5   Recall 3 3 3   Language- name 2 objects 2 2 2   Language- repeat 1 1 1   Language- follow 3 step command 3 3 3   Language- read & follow direction 1 1 1   Write a sentence 1 1 1   Copy design 1 1 1   Total score 30 30 30     Functional Status Survey: Is the patient deaf or have difficulty hearing?: No Does the patient have difficulty seeing, even when wearing glasses/contacts?: No Does the patient have difficulty concentrating, remembering, or making decisions?: Yes Does the patient have difficulty walking or climbing stairs?: Yes Does the patient have difficulty dressing or bathing?: Yes Does the patient have difficulty doing errands alone such as visiting a doctor's office or shopping?: Yes     08/24/2022   10:40 AM 09/22/2022    9:36 AM 12/22/2022   10:47 AM 04/01/2023   11:01 AM 04/04/2024    8:55 AM  Fall Risk  Falls in  the past year? 1 0 0 1 0  Was there an injury with Fall? 0 0  0 0  Fall Risk Category Calculator 1 0  2 0  Fall Risk Category (Retired) Low Low     (RETIRED) Patient Fall Risk Level Low fall risk Low fall risk     Patient at Risk for Falls Due to Impaired balance/gait No Fall Risks   No Fall Risks  Fall risk Follow up Falls evaluation completed Falls evaluation completed   Falls evaluation completed       02/23/2024   11:09 AM  Depression screen PHQ 2/9  Decreased Interest 1  Down, Depressed, Hopeless 1  PHQ - 2 Score 2  Altered sleeping 2  Tired, decreased energy 2  Change in appetite 1  Feeling bad or failure about yourself  0  Trouble concentrating 2  Moving slowly or fidgety/restless 0  Suicidal thoughts 0  PHQ-9 Score 9  Difficult doing work/chores Somewhat difficult       02/23/2024   11:09 AM 08/25/2023   11:48 AM 06/30/2023    5:08 PM 06/30/2023    4:20 PM  GAD 7 : Generalized Anxiety Score  Nervous, Anxious, on Edge 0 1 1 1   Control/stop worrying 1 1 2 2   Worry too much - different things 1 0 1 1  Trouble relaxing 1 2 2  2  Restless 0 0 0 0  Easily annoyed or irritable 1 0 0 0  Afraid - awful might happen 0 0 0 0  Total GAD 7 Score 4 4 6 6   Anxiety Difficulty Somewhat difficult Very difficult Somewhat difficult Somewhat difficult      Current Medication: Outpatient Encounter Medications as of 04/04/2024  Medication Sig Note   baclofen  (LIORESAL ) 10 MG tablet TAKE 1 TABLET(10 MG) BY MOUTH THREE TIMES DAILY AS NEEDED FOR MUSCLE SPASMS    buPROPion  (WELLBUTRIN ) 75 MG tablet Take 1 tablet (75 mg total) by mouth in the morning.    cetirizine (ZYRTEC) 10 MG tablet Take 10 mg by mouth daily.    Cholecalciferol (VITAMIN D3) 1000 UNITS CAPS Take 1,000 Units by mouth in the morning and at bedtime.     cyanocobalamin  (VITAMIN B12) 1000 MCG tablet Take 1,000 mcg by mouth daily.    desmopressin  (DDAVP ) 0.1 MG tablet TAKE 1 TABLET BY MOUTH EVERY DAY    DULoxetine   (CYMBALTA ) 20 MG capsule Take 1 capsule (20 mg total) by mouth daily. Take along with 60 mg daily    DULoxetine  (CYMBALTA ) 60 MG capsule Take 1 capsule (60 mg total) by mouth daily. Take along with 20 mg daily    hydrOXYzine  (ATARAX ) 25 MG tablet Take 1 tablet (25 mg total) by mouth 3 (three) times daily as needed for anxiety.    natalizumab  (TYSABRI ) 300 MG/15ML injection Inject 300 mg into the vein every 30 (thirty) days.  11/18/2020: JCV ab drawn on 11/11/20 negative, index: 0.15    oxyCODONE  (OXY IR/ROXICODONE ) 5 MG immediate release tablet Take 1 tablet (5 mg total) by mouth every 6 (six) hours as needed for severe pain (pain score 7-10).    polyethylene glycol (MIRALAX  / GLYCOLAX ) 17 g packet Take 17 g by mouth daily.    rizatriptan  (MAXALT -MLT) 10 MG disintegrating tablet Take 1 tablet (10 mg total) by mouth as needed for migraine. May repeat in 2 hours if needed    topiramate  (TOPAMAX ) 50 MG tablet Take 1 tablet (50 mg total) by mouth daily AND 2 tablets (100 mg total) at bedtime.    traZODone  (DESYREL ) 50 MG tablet Take 0.5-1 tablets (25-50 mg total) by mouth at bedtime as needed for sleep.    [DISCONTINUED] fluticasone  (FLONASE ) 50 MCG/ACT nasal spray SHAKE LIQUID AND USE 2 SPRAYS IN EACH NOSTRIL EVERY DAY    [DISCONTINUED] pantoprazole  (PROTONIX ) 40 MG tablet TAKE 1 TABLET(40 MG) BY MOUTH DAILY    fluticasone  (FLONASE ) 50 MCG/ACT nasal spray SHAKE LIQUID AND USE 2 SPRAYS IN EACH NOSTRIL EVERY DAY    pantoprazole  (PROTONIX ) 40 MG tablet Take 1 tablet (40 mg total) by mouth daily.    No facility-administered encounter medications on file as of 04/04/2024.    Surgical History: Past Surgical History:  Procedure Laterality Date   ABDOMINAL HYSTERECTOMY  2003   baclofen  pump insertion Right 01/04/2018   CARPAL TUNNEL RELEASE Right 1997   CESAREAN SECTION  1989   COLONOSCOPY WITH PROPOFOL  N/A 10/28/2020   Procedure: COLONOSCOPY WITH PROPOFOL ;  Surgeon: Marnee Sink, MD;  [facility];  Service: Endoscopy;  Laterality: N/A;  PRIORITY 4 port a cath   CYSTECTOMY  2002   From chest   CYSTECTOMY  1969   From Chest   ESOPHAGOGASTRODUODENOSCOPY (EGD) WITH PROPOFOL  N/A 05/19/2016   Procedure: ESOPHAGOGASTRODUODENOSCOPY (EGD) WITH PROPOFOL ;  Surgeon: Marnee Sink, MD;  Location: [facility] ENDOSCOPY;  Service: Endoscopy;  Laterality: N/A;   FRACTURE SURGERY  Left 2014   Fibula Fracture and dislocated ankle screws and plate placed   IR IMAGING GUIDED PORT INSERTION  06/24/2020   KIDNEY DONATION Left 2002   OVARIAN CYST REMOVAL Left 2002   Ovarian mass removal   PAIN PUMP IMPLANTATION Right 01/20/2024   Procedure: BACLOFEN  PUMP REPLACEMENT, ABDOMEN;  Surgeon: Pincus Bridgeman, DO;  Location: [facility] OR;  Service: Neurosurgery;  Laterality: Right;   TONSILLECTOMY  1973   UPPER GASTROINTESTINAL ENDOSCOPY  11/19/2014   Benign appearing esophageal stricture-Dilated.    Medical History: Past Medical History:  Diagnosis Date   Anxiety    Constipation    Cystitis bacillary, chronic    Depression    Dysphagia    Dx by Verneta Gone on 10/15/14   Eosinophilic esophagitis 11/19/2014   GE junction benign stricture s/p balloon dilation, retained food contents. ?gastroparesis versus secondary to EE.   Esophageal dysphagia    GERD (gastroesophageal reflux disease)    Heartburn    Hypertension    Mental depression 2024   Migraine headache    "couple times each month"   Multilevel degenerative disc disease    Multiple sclerosis (HCC) 07/16/2015   Neuromuscular disorder (HCC)    Multiple Sclerosis   Port-A-Cath in place    right side   Presence of intrathecal baclofen  pump    right abdomen   Vertigo    Rare - none in last 6 months    Family History: Family History  Problem Relation Age of Onset   Diabetes Mother    Hypertension Mother    Heart disease Mother    Pulmonary embolism Mother    Diabetes Sister    Hypertension Sister    Heart disease Sister    Bipolar  disorder Sister    Narcolepsy Brother    Dementia Maternal Aunt    Breast cancer Maternal Aunt    Diabetes Maternal Grandmother    Heart attack Maternal Grandmother    Diabetes Maternal Grandfather    Pancreatic cancer Maternal Grandfather     Social History   Socioeconomic History   Marital status: Married    Spouse name: Antonio Baumgarten   Number of children: 1   Years of education: Not on file   Highest education level: Some college, no degree  Occupational History   Occupation: Disability  Tobacco Use   Smoking status: Never   Smokeless tobacco: Never  Vaping Use   Vaping status: Never Used  Substance and Sexual Activity   Alcohol use: No    Alcohol/week: 0.0 standard drinks of alcohol    Comment: rare wine on Holidays   Drug use: No   Sexual activity: Not Currently  Other Topics Concern   Not on file  Social History Narrative   Lives with husband, Yuka Siravo   Right handed    Caffeine use: tea sometimes (mostly herbal)   Social Drivers of Health   Financial Resource Strain: Low Risk  (05/15/2021)   Overall Financial Resource Strain (CARDIA)    Difficulty of Paying Living Expenses: Not very hard  Food Insecurity: No Food Insecurity (01/06/2023)   Hunger Vital Sign    Worried About Running Out of Food in the Last Year: Never true    Ran Out of Food in the Last Year: Never true  Transportation Needs: No Transportation Needs (01/06/2023)   PRAPARE - Administrator, Civil Service (Medical): No    Lack of Transportation (Non-Medical): No  Physical Activity: Not on file  Stress: Not  on file  Social Connections: Not on file  Intimate Partner Violence: Not At Risk (01/06/2023)   Humiliation, Afraid, Rape, and Kick questionnaire    Fear of Current or Ex-Partner: No    Emotionally Abused: No    Physically Abused: No    Sexually Abused: No      Review of Systems  Constitutional:  Negative for activity change, appetite change, chills, fatigue, fever and  unexpected weight change.  HENT: Negative.  Negative for congestion, ear pain, rhinorrhea, sore throat and trouble swallowing.   Eyes: Negative.   Respiratory: Negative.  Negative for cough, chest tightness, shortness of breath and wheezing.   Cardiovascular: Negative.  Negative for chest pain and palpitations.  Gastrointestinal: Negative.  Negative for abdominal pain, blood in stool, constipation, diarrhea, nausea and vomiting.  Endocrine: Negative.   Genitourinary: Negative.  Negative for difficulty urinating, dysuria, frequency, hematuria and urgency.  Musculoskeletal:  Positive for gait problem. Negative for arthralgias, back pain, joint swelling, myalgias and neck pain.  Skin: Negative.  Negative for rash and wound.  Allergic/Immunologic: Negative.  Negative for immunocompromised state.  Neurological:  Positive for weakness and headaches. Negative for dizziness, seizures and numbness.  Hematological: Negative.   Psychiatric/Behavioral:  Positive for behavioral problems and sleep disturbance. Negative for self-injury and suicidal ideas. The patient is nervous/anxious.     Vital Signs: BP 124/81   Pulse 78   Temp 98.4 F (36.9 C)   Resp 16   Ht 5\' 3"  (1.6 m)   Wt 187 lb 3.2 oz (84.9 kg)   SpO2 99%   BMI 33.16 kg/m    Physical Exam Vitals reviewed.  Constitutional:      General: She is awake. She is not in acute distress.    Appearance: Normal appearance. She is well-developed and well-groomed. She is obese. She is not ill-appearing or diaphoretic.  HENT:     Head: Normocephalic and atraumatic.     Mouth/Throat:     Lips: Pink.     Pharynx: Uvula midline.  Eyes:     General: Lids are normal. Vision grossly intact. Gaze aligned appropriately.     Extraocular Movements: Extraocular movements intact.     Conjunctiva/sclera: Conjunctivae normal.     Pupils: Pupils are equal, round, and reactive to light.     Funduscopic exam:    Right eye: Red reflex present.        Left  eye: Red reflex present. Neck:     Thyroid : No thyromegaly.     Vascular: No JVD.     Trachea: No tracheal deviation.  Cardiovascular:     Rate and Rhythm: Normal rate and regular rhythm.     Pulses: Normal pulses.     Heart sounds: Normal heart sounds, S1 normal and S2 normal. No murmur heard.    No friction rub. No gallop.  Pulmonary:     Effort: Pulmonary effort is normal. No accessory muscle usage or respiratory distress.     Breath sounds: Normal breath sounds and air entry. No stridor. No wheezing or rales.  Chest:     Chest wall: No tenderness.  Skin:    General: Skin is warm and dry.     Capillary Refill: Capillary refill takes less than 2 seconds.  Neurological:     Mental Status: She is alert and oriented to person, place, and time.     Cranial Nerves: No cranial nerve deficit.     Motor: Weakness present. No abnormal muscle tone.  Coordination: Coordination normal.     Gait: Gait abnormal.     Deep Tendon Reflexes: Reflexes are normal and symmetric.  Psychiatric:        Mood and Affect: Mood normal.        Behavior: Behavior normal. Behavior is cooperative.        Assessment/Plan: 1. Encounter for subsequent annual wellness visit (AWV) in Medicare patient (Primary) Age-appropriate preventive screenings and vaccinations discussed. Routine labs for health maintenance will be ordered. PHM updated.    2. Multiple sclerosis (HCC) Gets Tysabri  infusions and has a baclofen  implanted pump. Followed by neurology.   3. Mixed hyperlipidemia Not currently on statin therapy. Will repeat lab  4. Gastroesophageal reflux disease without esophagitis Continue pantoprazole  as prescribed.  - pantoprazole  (PROTONIX ) 40 MG tablet; Take 1 tablet (40 mg total) by mouth daily.  Dispense: 90 tablet; Refill: 1  5. B12 deficiency History of low B12, will recheck levels.   6. Seasonal allergic rhinitis due to pollen Continue flonase  as needed as prescribed.  - fluticasone   (FLONASE ) 50 MCG/ACT nasal spray; SHAKE LIQUID AND USE 2 SPRAYS IN EACH NOSTRIL EVERY DAY  Dispense: 48 g; Refill: 3  7. Single kidney Needs kidney function checked. Will order labs   8. Major depressive disorder, recurrent episode, moderate (HCC) Sees Dr. Eappen for psychiatry. Currently taking duloxetine  and bupropion .       General Counseling: Koi verbalizes understanding of the findings of todays visit and agrees with plan of treatment. I have discussed any further diagnostic evaluation that may be needed or ordered today. We also reviewed her medications today. she has been encouraged to call the office with any questions or concerns that should arise related to todays visit.    No orders of the defined types were placed in this encounter.   Meds ordered this encounter  Medications   pantoprazole  (PROTONIX ) 40 MG tablet    Sig: Take 1 tablet (40 mg total) by mouth daily.    Dispense:  90 tablet    Refill:  1    For future refills   fluticasone  (FLONASE ) 50 MCG/ACT nasal spray    Sig: SHAKE LIQUID AND USE 2 SPRAYS IN EACH NOSTRIL EVERY DAY    Dispense:  48 g    Refill:  3    Return in about 2 weeks (around 04/18/2024) for F/U, Labs, Sheneika Walstad PCP.   Total time spent:30 Minutes Time spent includes review of chart, medications, test results, and follow up plan with the patient.   [place] Controlled Substance Database was reviewed by me.  This patient was seen by Laurence Pons, FNP-C in collaboration with Dr. Verneta Gone as a part of collaborative care agreement.  Tyland Klemens R. Bobbi Burow, MSN, FNP-C Internal medicine

## 2024-04-06 ENCOUNTER — Other Ambulatory Visit: Payer: Self-pay | Admitting: Nurse Practitioner

## 2024-04-06 DIAGNOSIS — Z905 Acquired absence of kidney: Secondary | ICD-10-CM | POA: Diagnosis not present

## 2024-04-06 DIAGNOSIS — E782 Mixed hyperlipidemia: Secondary | ICD-10-CM | POA: Diagnosis not present

## 2024-04-06 DIAGNOSIS — E538 Deficiency of other specified B group vitamins: Secondary | ICD-10-CM | POA: Diagnosis not present

## 2024-04-06 DIAGNOSIS — E039 Hypothyroidism, unspecified: Secondary | ICD-10-CM | POA: Diagnosis not present

## 2024-04-06 DIAGNOSIS — E559 Vitamin D deficiency, unspecified: Secondary | ICD-10-CM | POA: Diagnosis not present

## 2024-04-06 DIAGNOSIS — G35 Multiple sclerosis: Secondary | ICD-10-CM | POA: Diagnosis not present

## 2024-04-08 LAB — URINALYSIS, ROUTINE W REFLEX MICROSCOPIC
Bilirubin, UA: NEGATIVE
Glucose, UA: NEGATIVE
Ketones, UA: NEGATIVE
Leukocytes,UA: NEGATIVE
Nitrite, UA: NEGATIVE
Protein,UA: NEGATIVE
RBC, UA: NEGATIVE
Specific Gravity, UA: 1.011 (ref 1.005–1.030)
Urobilinogen, Ur: 0.2 mg/dL (ref 0.2–1.0)
pH, UA: 8.5 — ABNORMAL HIGH (ref 5.0–7.5)

## 2024-04-08 LAB — MICROALBUMIN / CREATININE URINE RATIO: Creatinine, Urine: 49.1 mg/dL

## 2024-04-08 LAB — TSH: TSH: 1.23 u[IU]/mL (ref 0.450–4.500)

## 2024-04-08 LAB — LIPID PANEL
Chol/HDL Ratio: 3.1 ratio (ref 0.0–4.4)
Cholesterol, Total: 237 mg/dL — ABNORMAL HIGH (ref 100–199)
HDL: 76 mg/dL (ref >39)
LDL Chol Calc (NIH): 149 mg/dL — ABNORMAL HIGH (ref 0–99)
Triglycerides: 73 mg/dL (ref 0–149)
VLDL Cholesterol Cal: 12 mg/dL (ref 5–40)

## 2024-04-08 LAB — B12 AND FOLATE PANEL
Folate: 7.1 ng/mL (ref >3.0)
Vitamin B-12: 1309 pg/mL — ABNORMAL HIGH (ref 232–1245)

## 2024-04-08 LAB — T4, FREE: Free T4: 0.95 ng/dL (ref 0.82–1.77)

## 2024-04-08 LAB — VITAMIN D 25 HYDROXY (VIT D DEFICIENCY, FRACTURES): Vit D, 25-Hydroxy: 36.4 ng/mL (ref 30.0–100.0)

## 2024-04-11 ENCOUNTER — Encounter: Payer: Self-pay | Admitting: Neurology

## 2024-04-11 NOTE — Telephone Encounter (Signed)
 Called pt. Rescheduled appt from 05/03/24 to 04/19/24 at 930am with Dr. Godwin Lat.

## 2024-04-13 DIAGNOSIS — G35 Multiple sclerosis: Secondary | ICD-10-CM | POA: Diagnosis not present

## 2024-04-17 ENCOUNTER — Ambulatory Visit: Admitting: Nurse Practitioner

## 2024-04-17 ENCOUNTER — Other Ambulatory Visit: Payer: Self-pay | Admitting: *Deleted

## 2024-04-17 DIAGNOSIS — Z79899 Other long term (current) drug therapy: Secondary | ICD-10-CM

## 2024-04-17 DIAGNOSIS — G35 Multiple sclerosis: Secondary | ICD-10-CM

## 2024-04-18 ENCOUNTER — Other Ambulatory Visit: Payer: Self-pay

## 2024-04-18 ENCOUNTER — Telehealth: Payer: Self-pay | Admitting: *Deleted

## 2024-04-18 DIAGNOSIS — G35 Multiple sclerosis: Secondary | ICD-10-CM | POA: Diagnosis not present

## 2024-04-18 DIAGNOSIS — Z79899 Other long term (current) drug therapy: Secondary | ICD-10-CM | POA: Diagnosis not present

## 2024-04-18 DIAGNOSIS — G35D Multiple sclerosis, unspecified: Secondary | ICD-10-CM

## 2024-04-18 NOTE — Telephone Encounter (Signed)
 Placed [facility] lab in [facility] lock box for routine lab pick up. Results pending.

## 2024-04-19 ENCOUNTER — Encounter: Payer: Self-pay | Admitting: Nurse Practitioner

## 2024-04-19 ENCOUNTER — Ambulatory Visit: Admitting: Neurology

## 2024-04-19 ENCOUNTER — Ambulatory Visit (INDEPENDENT_AMBULATORY_CARE_PROVIDER_SITE_OTHER): Admitting: Nurse Practitioner

## 2024-04-19 ENCOUNTER — Encounter: Payer: Self-pay | Admitting: Neurology

## 2024-04-19 VITALS — BP 129/73 | HR 68 | Ht 63.0 in | Wt 188.0 lb

## 2024-04-19 VITALS — BP 136/82 | HR 67 | Temp 98.2°F | Resp 16 | Ht 63.0 in | Wt 188.2 lb

## 2024-04-19 DIAGNOSIS — G4719 Other hypersomnia: Secondary | ICD-10-CM | POA: Diagnosis not present

## 2024-04-19 DIAGNOSIS — E538 Deficiency of other specified B group vitamins: Secondary | ICD-10-CM | POA: Diagnosis not present

## 2024-04-19 DIAGNOSIS — R0683 Snoring: Secondary | ICD-10-CM

## 2024-04-19 DIAGNOSIS — G35 Multiple sclerosis: Secondary | ICD-10-CM

## 2024-04-19 DIAGNOSIS — E559 Vitamin D deficiency, unspecified: Secondary | ICD-10-CM

## 2024-04-19 DIAGNOSIS — Z905 Acquired absence of kidney: Secondary | ICD-10-CM

## 2024-04-19 DIAGNOSIS — E782 Mixed hyperlipidemia: Secondary | ICD-10-CM

## 2024-04-19 LAB — CBC WITH DIFFERENTIAL/PLATELET
Basophils Absolute: 0.1 10*3/uL (ref 0.0–0.2)
Basos: 1 %
EOS (ABSOLUTE): 0.2 10*3/uL (ref 0.0–0.4)
Eos: 3 %
Hematocrit: 36.5 % (ref 34.0–46.6)
Hemoglobin: 11.4 g/dL (ref 11.1–15.9)
Immature Grans (Abs): 0 10*3/uL (ref 0.0–0.1)
Immature Granulocytes: 0 %
Lymphocytes Absolute: 4.3 10*3/uL — ABNORMAL HIGH (ref 0.7–3.1)
Lymphs: 68 %
MCH: 29.6 pg (ref 26.6–33.0)
MCHC: 31.2 g/dL — ABNORMAL LOW (ref 31.5–35.7)
MCV: 95 fL (ref 79–97)
Monocytes Absolute: 0.4 10*3/uL (ref 0.1–0.9)
Monocytes: 6 %
NRBC: 1 % — ABNORMAL HIGH (ref 0–0)
Neutrophils Absolute: 1.4 10*3/uL (ref 1.4–7.0)
Neutrophils: 22 %
Platelets: 256 10*3/uL (ref 150–450)
RBC: 3.85 x10E6/uL (ref 3.77–5.28)
RDW: 12.4 % (ref 11.7–15.4)
WBC: 6.4 10*3/uL (ref 3.4–10.8)

## 2024-04-19 MED ORDER — DESMOPRESSIN ACETATE 0.1 MG PO TABS
100.0000 ug | ORAL_TABLET | Freq: Every day | ORAL | 3 refills | Status: DC
Start: 2024-04-19 — End: 2024-09-13

## 2024-04-19 MED ORDER — TOPIRAMATE 50 MG PO TABS
ORAL_TABLET | ORAL | 3 refills | Status: DC
Start: 2024-04-19 — End: 2024-11-01

## 2024-04-19 MED ORDER — BACLOFEN 10 MG PO TABS: ORAL_TABLET | ORAL | 3 refills | Status: DC

## 2024-04-19 NOTE — Progress Notes (Signed)
 [facility] [address] [place], [place] [address]  Internal MEDICINE  Office Visit Note  Patient Name: Rose Clements  [id number]  [id number]  Date of Service: 04/19/2024  Chief Complaint  Patient presents with   Depression   Gastroesophageal Reflux   Hypertension   Follow-up    Review labs     HPI Rose Clements presents for a follow-up visit for lab results and MS.  High cholesterol -- LDL is still elevated but has improved. Total cholesterol is elevated. HDL is good. Triglycerides are normal.  Low normal vitamin D  Low B12 -- improved  Multiple sclerosis -- sees neurology for treatment.     Current Medication: Outpatient Encounter Medications as of 04/19/2024  Medication Sig Note   baclofen  (LIORESAL ) 10 MG tablet One po tid    buPROPion  (WELLBUTRIN ) 75 MG tablet Take 1 tablet (75 mg total) by mouth in the morning.    cetirizine (ZYRTEC) 10 MG tablet Take 10 mg by mouth daily.    Cholecalciferol (VITAMIN D3) 1000 UNITS CAPS Take 1,000 Units by mouth in the morning and at bedtime.     cyanocobalamin  (VITAMIN B12) 1000 MCG tablet Take 1,000 mcg by mouth daily.    desmopressin  (DDAVP ) 0.1 MG tablet Take 1 tablet (100 mcg total) by mouth daily.    DULoxetine  (CYMBALTA ) 20 MG capsule Take 1 capsule (20 mg total) by mouth daily. Take along with 60 mg daily    DULoxetine  (CYMBALTA ) 60 MG capsule Take 1 capsule (60 mg total) by mouth daily. Take along with 20 mg daily    fluticasone  (FLONASE ) 50 MCG/ACT nasal spray SHAKE LIQUID AND USE 2 SPRAYS IN EACH NOSTRIL EVERY DAY    hydrOXYzine  (ATARAX ) 25 MG tablet Take 1 tablet (25 mg total) by mouth 3 (three) times daily as needed for anxiety.    natalizumab  (TYSABRI ) 300 MG/15ML injection Inject 300 mg into the vein every 30 (thirty) days.  11/18/2020: JCV ab drawn on 11/11/20 negative, index: 0.15    oxyCODONE  (OXY IR/ROXICODONE ) 5 MG immediate release tablet Take 1 tablet (5 mg total) by mouth every 6 (six) hours as needed for  severe pain (pain score 7-10).    pantoprazole  (PROTONIX ) 40 MG tablet Take 1 tablet (40 mg total) by mouth daily.    polyethylene glycol (MIRALAX  / GLYCOLAX ) 17 g packet Take 17 g by mouth daily.    rizatriptan  (MAXALT -MLT) 10 MG disintegrating tablet Take 1 tablet (10 mg total) by mouth as needed for migraine. May repeat in 2 hours if needed    topiramate  (TOPAMAX ) 50 MG tablet Take 1 tablet (50 mg total) by mouth daily AND 2 tablets (100 mg total) at bedtime.    traZODone  (DESYREL ) 50 MG tablet Take 0.5-1 tablets (25-50 mg total) by mouth at bedtime as needed for sleep.    No facility-administered encounter medications on file as of 04/19/2024.    Surgical History: Past Surgical History:  Procedure Laterality Date   ABDOMINAL HYSTERECTOMY  2003   baclofen  pump insertion Right 01/04/2018   CARPAL TUNNEL RELEASE Right 1997   CESAREAN SECTION  1989   COLONOSCOPY WITH PROPOFOL  N/A 10/28/2020   Procedure: COLONOSCOPY WITH PROPOFOL ;  Surgeon: Marnee Sink, MD;  Location: [facility];  Service: Endoscopy;  Laterality: N/A;  PRIORITY 4 port a cath   CYSTECTOMY  2002   From chest   CYSTECTOMY  1969   From Chest   ESOPHAGOGASTRODUODENOSCOPY (EGD) WITH PROPOFOL  N/A 05/19/2016   Procedure: ESOPHAGOGASTRODUODENOSCOPY (EGD) WITH PROPOFOL ;  Surgeon: Marnee Sink, MD;  Location: [facility];  Service: Endoscopy;  Laterality: N/A;   FRACTURE SURGERY Left 2014   Fibula Fracture and dislocated ankle screws and plate placed   IR IMAGING GUIDED PORT INSERTION  06/24/2020   KIDNEY DONATION Left 2002   OVARIAN CYST REMOVAL Left 2002   Ovarian mass removal   PAIN PUMP IMPLANTATION Right 01/20/2024   Procedure: BACLOFEN  PUMP REPLACEMENT, ABDOMEN;  Surgeon: Pincus Bridgeman, DO;  Location: [facility] OR;  Service: Neurosurgery;  Laterality: Right;   TONSILLECTOMY  1973   UPPER GASTROINTESTINAL ENDOSCOPY  11/19/2014   Benign appearing esophageal stricture-Dilated.    Medical History: Past Medical  History:  Diagnosis Date   Anxiety    Constipation    Cystitis bacillary, chronic    Depression    Dysphagia    Dx by Verneta Gone on 10/15/14   Eosinophilic esophagitis 11/19/2014   GE junction benign stricture s/p balloon dilation, retained food contents. ?gastroparesis versus secondary to EE.   Esophageal dysphagia    GERD (gastroesophageal reflux disease)    Heartburn    Hypertension    Mental depression 2024   Migraine headache    "couple times each month"   Multilevel degenerative disc disease    Multiple sclerosis (HCC) 07/16/2015   Neuromuscular disorder (HCC)    Multiple Sclerosis   Port-A-Cath in place    right side   Presence of intrathecal baclofen  pump    right abdomen   Vertigo    Rare - none in last 6 months    Family History: Family History  Problem Relation Age of Onset   Diabetes Mother    Hypertension Mother    Heart disease Mother    Pulmonary embolism Mother    Diabetes Sister    Hypertension Sister    Heart disease Sister    Bipolar disorder Sister    Narcolepsy Brother    Dementia Maternal Aunt    Breast cancer Maternal Aunt    Diabetes Maternal Grandmother    Heart attack Maternal Grandmother    Diabetes Maternal Grandfather    Pancreatic cancer Maternal Grandfather     Social History   Socioeconomic History   Marital status: Married    Spouse name: Antonio Baumgarten   Number of children: 1   Years of education: Not on file   Highest education level: Some college, no degree  Occupational History   Occupation: Disability  Tobacco Use   Smoking status: Never   Smokeless tobacco: Never  Vaping Use   Vaping status: Never Used  Substance and Sexual Activity   Alcohol use: No    Alcohol/week: 0.0 standard drinks of alcohol    Comment: rare wine on Holidays   Drug use: No   Sexual activity: Not Currently  Other Topics Concern   Not on file  Social History Narrative   Lives with husband, Rose Clements   Right handed    Caffeine use: tea  sometimes (mostly herbal)   Social Drivers of Health   Financial Resource Strain: Low Risk  (05/15/2021)   Overall Financial Resource Strain (CARDIA)    Difficulty of Paying Living Expenses: Not very hard  Food Insecurity: No Food Insecurity (01/06/2023)   Hunger Vital Sign    Worried About Running Out of Food in the Last Year: Never true    Ran Out of Food in the Last Year: Never true  Transportation Needs: No Transportation Needs (01/06/2023)   PRAPARE - Transportation    Lack of Transportation (  Medical): No    Lack of Transportation (Non-Medical): No  Physical Activity: Not on file  Stress: Not on file  Social Connections: Not on file  Intimate Partner Violence: Not At Risk (01/06/2023)   Humiliation, Afraid, Rape, and Kick questionnaire    Fear of Current or Ex-Partner: No    Emotionally Abused: No    Physically Abused: No    Sexually Abused: No      Review of Systems  Constitutional:  Negative for chills, fatigue and unexpected weight change.  HENT:  Negative for congestion, rhinorrhea, sneezing and sore throat.   Eyes:  Negative for redness.  Respiratory:  Negative for cough, chest tightness and shortness of breath.   Cardiovascular:  Negative for chest pain and palpitations.  Gastrointestinal:  Negative for abdominal pain, constipation, diarrhea, nausea and vomiting.  Genitourinary:  Negative for dysuria and frequency.  Musculoskeletal:  Negative for arthralgias, back pain, joint swelling and neck pain.  Skin:  Negative for rash.  Neurological: Negative.  Negative for tremors and numbness.  Hematological:  Negative for adenopathy. Does not bruise/bleed easily.  Psychiatric/Behavioral:  Positive for behavioral problems (Depression) and sleep disturbance. Negative for self-injury and suicidal ideas. The patient is nervous/anxious.     Vital Signs: BP 136/82   Pulse 67   Temp 98.2 F (36.8 C)   Resp 16   Ht 5\' 3"  (1.6 m)   Wt 188 lb 3.2 oz (85.4 kg)   SpO2 96%   BMI  33.34 kg/m    Physical Exam Vitals reviewed.  Constitutional:      General: She is not in acute distress.    Appearance: Normal appearance. She is obese. She is not ill-appearing.  Eyes:     Pupils: Pupils are equal, round, and reactive to light.  Cardiovascular:     Heart sounds: No murmur heard. Pulmonary:     Effort: Pulmonary effort is normal. No respiratory distress.  Musculoskeletal:     Cervical back: Neck supple. No tenderness.  Lymphadenopathy:     Cervical: No cervical adenopathy.  Neurological:     Mental Status: She is alert and oriented to person, place, and time.  Psychiatric:        Mood and Affect: Mood is depressed.        Behavior: Behavior normal.        Assessment/Plan: 1. Multiple sclerosis (HCC) (Primary) Continue follow up and treatment with neurology.   2. Mixed hyperlipidemia Not currently on statin therapy. Continue diet modifications as previously discussed.   3. B12 deficiency Continue OTC B12 supplement.   4. Vitamin D  deficiency Continue OTC vitamin D  supplement.   5. Single kidney Kidney function is normal, continue to monitor periodically    General Counseling: Rose Clements verbalizes understanding of the findings of todays visit and agrees with plan of treatment. I have discussed any further diagnostic evaluation that may be needed or ordered today. We also reviewed her medications today. she has been encouraged to call the office with any questions or concerns that should arise related to todays visit.    No orders of the defined types were placed in this encounter.   No orders of the defined types were placed in this encounter.   Return in about 6 months (around 10/20/2024) for F/U, Jalon Squier PCP.   Total time spent:30 Minutes Time spent includes review of chart, medications, test results, and follow up plan with the patient.   [place] Controlled Substance Database was reviewed by me.  This patient was  seen by Laurence Pons,  FNP-C in collaboration with Dr. Verneta Gone as a part of collaborative care agreement.   Aadvik Roker R. Bobbi Burow, MSN, FNP-C Internal medicine

## 2024-04-19 NOTE — Patient Instructions (Signed)
 Start an over the counter fish oil or flaxseed oil supplement for cholesterol.

## 2024-04-19 NOTE — Progress Notes (Signed)
 [facility]  PATIENT: Rose Clements DOB: Dec 02, 1961  REFERRING DOCTOR OR PCP:  Verneta Gone, MD SOURCE: Patient, notes from primary care and neurology, MRI and laboratory reports, MRI images personally reviewed.  _________________________________   HISTORICAL  CHIEF COMPLAINT:  Chief Complaint  Patient presents with   Follow-up    Pt in room 10. Here for MS follow up. DMT: Tysabri . Last infusion 04/18/24. Pt reports MS is stable. Last eye exam was 2 years ago.    Rose Clements is a 63 y.o. woman with relapsing multiple sclerosis.    Update 04/19/2024 She is on Tysabri  and tolerates it well.    JCV Ab was negative (0.16 10/2023).   She has tolerated Tysabri  very well.  She has not had any exacerbations.  She had some slow progression of gait disturbance  Gait is the same, some trips but no falls.  She uses the walker due to left foot drop.  She has spasms on her left as well..   She also has RIGHT hand weakness.  She can go 200 + feet with her walker     She has some tingling in legs bilaterally, left > right.  She has stiffness in her legs bilaterally.  Baclofen  helps her some.   She does not become sleepy.      She has vertigo.    LFCN pain resolved and she stopped lamotrigine .    She has a baclofen  pump  since 2019 for MS related spasticity.   Pump placed by Dr. Julane Ny.  She nots a little more spasticity this year than last year.   .   Dose is low (104 mcg/day at 4.3 / hr).     She was switched to higher concentration so only needs a refill twice a year now.   She still takes rare 10 mg baclofen  pills as needed when she notes more tightness in her legs.  As she relies some on the spasticity to walk.  I am reluctant to raise the dose further unless she has a lot more symptoms.  Mood is better.   She had a suicide attempt in 2024(running car engine in garage) and did a 5 day inpatient psychiatry stay.    She is on duloxetine   She is seeing Dr. Eappen and a therapist q 2  weeks  She has fatigue.  She tried Modafinil  and felt it dried her out and did not help much.   She sleeps poorly most  night.    She snores and has EDS   Occasionally wakes up feeling choked   EPWORTH SLEEPINESS SCALE  On a scale of 0 - 3 what is the chance of dozing:  Sitting and Reading:     3 Watching TV:    3 Sitting inactive in a public place: 0 Passenger in car for one hour: 1 Lying down to rest in the afternoon: 3 Sitting and talking to someone: 1 Sitting quietly after lunch:  3  In a car, stopped in traffic:  0  Total (out of 24):    14/24   mild to moderate EDS   She reports migraines are doing okay with Topamax .  The imipramine  was discontinued when she had her psychiatry admission.   She has a headache about 1/week.   There are migrainous features with nausea, photophobia/phonopobia.   She takes Tylenol  when one occurs which usually helps.  She can take the rizatriptan  if 1 occurs.  DDAVP  seemed to help her nocturia more at  first than now..    Because she was a kidney donor, she has only 1 kidney.  Creatinine and BUN have remained in the normal levels over the last few years but I would be reluctant to raise the dose further  She has fatigue and sleeiness.     Modafinil  helped some but she stopped    She does not sleep well.   She snores but does not have OSA.    She reports PSG with Dr. Cam Cava in [place] showed narcolepsy.  She does not described cataplexy symptoms     She has vivid dreams and occasional hypnagogic hallucinations and sleep paralysis.       MS HIstory:  She  was diagnosed with relapsing remitting MS in 2011 after she presented with stiffness in her left arm and a clumsy gait with some falls.   In retrospect some of the leg symptoms started a little earlier.    She had MRI's and LP with lesions and CSF findings consistent with MS.    She was placed on Avonex.   She did ok initially but then had an exacerbation with more issues with her legs.   She was  started on Tysabri  about 2016 after these issues.   She has done well with Tysabri  and has not had any exacerbations.   Initially, she was diagnosed and treated in [place] .  She moved to [place]  [facility]) in 2016 and has seen Dr. Carrolyn Clan at [facility] since that time.  She transferred care to us  mid 2021.    IMAGING: MRI cervical spine 04/25/2023 showed no new lesions  MRI brain 04/25/2023  showed no new lesions. MRI of the cervical spine 03/07/2018 and 05/15/2018 and 01/19/2020 all show a couple T2 hyperintense lesions within the spinal cord.  One to the left adjacent to C2-C3 and 1 to the right adjacent to C4.    Also has right thyroid  cyst.   MRI of the brain 05/15/2018 and 03/07/2018 and 2/212/2021 show multiple T2/flair hyperintense foci.  Somewhat nonspecific but a few are periventricular and juxtacortical.  There is no change between the 3 studies.     MRI of the thoracic spine 05/16/2015 did not show any definite lesions.   Hemangiomas within the T2 and T6 vertebral body.    REVIEW OF SYSTEMS: Constitutional: No fevers, chills, sweats, or change in appetite Eyes: No visual changes, double vision, eye pain Ear, nose and throat: No hearing loss, ear pain, nasal congestion, sore throat Cardiovascular: No chest pain, palpitations Respiratory:  No shortness of breath at rest or with exertion.   No wheezes GastrointestinaI: No nausea, vomiting, diarrhea, abdominal pain, fecal incontinence Genitourinary:  No dysuria, urinary retention or frequency.  No nocturia. Musculoskeletal:  No neck pain, back pain Integumentary: No rash, pruritus, skin lesions Neurological: as above Psychiatric: No depression at this time.  No anxiety Endocrine: No palpitations, diaphoresis, change in appetite, change in weigh or increased thirst Hematologic/Lymphatic:  No anemia, purpura, petechiae. Allergic/Immunologic: No itchy/runny eyes, nasal congestion, recent allergic reactions, rashes  ALLERGIES: Allergies   Allergen Reactions   Lisinopril  Hives    HOME MEDICATIONS:  Current Outpatient Medications:    buPROPion  (WELLBUTRIN ) 75 MG tablet, Take 1 tablet (75 mg total) by mouth in the morning., Disp: 90 tablet, Rfl: 1   cetirizine (ZYRTEC) 10 MG tablet, Take 10 mg by mouth daily., Disp: , Rfl:    Cholecalciferol (VITAMIN D3) 1000 UNITS CAPS, Take 1,000 Units by mouth in the morning and at  bedtime. , Disp: , Rfl:    cyanocobalamin  (VITAMIN B12) 1000 MCG tablet, Take 1,000 mcg by mouth daily., Disp: , Rfl:    DULoxetine  (CYMBALTA ) 20 MG capsule, Take 1 capsule (20 mg total) by mouth daily. Take along with 60 mg daily, Disp: 90 capsule, Rfl: 1   DULoxetine  (CYMBALTA ) 60 MG capsule, Take 1 capsule (60 mg total) by mouth daily. Take along with 20 mg daily, Disp: 90 capsule, Rfl: 1   fluticasone  (FLONASE ) 50 MCG/ACT nasal spray, SHAKE LIQUID AND USE 2 SPRAYS IN EACH NOSTRIL EVERY DAY, Disp: 48 g, Rfl: 3   hydrOXYzine  (ATARAX ) 25 MG tablet, Take 1 tablet (25 mg total) by mouth 3 (three) times daily as needed for anxiety., Disp: 90 tablet, Rfl: 2   natalizumab  (TYSABRI ) 300 MG/15ML injection, Inject 300 mg into the vein every 30 (thirty) days. , Disp: , Rfl:    pantoprazole  (PROTONIX ) 40 MG tablet, Take 1 tablet (40 mg total) by mouth daily., Disp: 90 tablet, Rfl: 1   polyethylene glycol (MIRALAX  / GLYCOLAX ) 17 g packet, Take 17 g by mouth daily., Disp: 28 each, Rfl: 1   rizatriptan  (MAXALT -MLT) 10 MG disintegrating tablet, Take 1 tablet (10 mg total) by mouth as needed for migraine. May repeat in 2 hours if needed, Disp: 12 tablet, Rfl: 11   traZODone  (DESYREL ) 50 MG tablet, Take 0.5-1 tablets (25-50 mg total) by mouth at bedtime as needed for sleep., Disp: 30 tablet, Rfl: 1   baclofen  (LIORESAL ) 10 MG tablet, One po tid, Disp: 270 tablet, Rfl: 3   desmopressin  (DDAVP ) 0.1 MG tablet, Take 1 tablet (100 mcg total) by mouth daily., Disp: 90 tablet, Rfl: 3   oxyCODONE  (OXY IR/ROXICODONE ) 5 MG immediate release  tablet, Take 1 tablet (5 mg total) by mouth every 6 (six) hours as needed for severe pain (pain score 7-10). (Patient not taking: Reported on 04/19/2024), Disp: 20 tablet, Rfl: 0   topiramate  (TOPAMAX ) 50 MG tablet, Take 1 tablet (50 mg total) by mouth daily AND 2 tablets (100 mg total) at bedtime., Disp: 270 tablet, Rfl: 3  PAST MEDICAL HISTORY: Past Medical History:  Diagnosis Date   Anxiety    Constipation    Cystitis bacillary, chronic    Depression    Dysphagia    Dx by Verneta Gone on 10/15/14   Eosinophilic esophagitis 11/19/2014   GE junction benign stricture s/p balloon dilation, retained food contents. ?gastroparesis versus secondary to EE.   Esophageal dysphagia    GERD (gastroesophageal reflux disease)    Heartburn    Hypertension    Mental depression 2024   Migraine headache    "couple times each month"   Multilevel degenerative disc disease    Multiple sclerosis (HCC) 07/16/2015   Neuromuscular disorder (HCC)    Multiple Sclerosis   Port-A-Cath in place    right side   Presence of intrathecal baclofen  pump    right abdomen   Vertigo    Rare - none in last 6 months    PAST SURGICAL HISTORY: Past Surgical History:  Procedure Laterality Date   ABDOMINAL HYSTERECTOMY  2003   baclofen  pump insertion Right 01/04/2018   CARPAL TUNNEL RELEASE Right 1997   CESAREAN SECTION  1989   COLONOSCOPY WITH PROPOFOL  N/A 10/28/2020   Procedure: COLONOSCOPY WITH PROPOFOL ;  Surgeon: Marnee Sink, MD;  Location: [facility];  Service: Endoscopy;  Laterality: N/A;  PRIORITY 4 port a cath   CYSTECTOMY  2002   From chest  CYSTECTOMY  1969   From Chest   ESOPHAGOGASTRODUODENOSCOPY (EGD) WITH PROPOFOL  N/A 05/19/2016   Procedure: ESOPHAGOGASTRODUODENOSCOPY (EGD) WITH PROPOFOL ;  Surgeon: Marnee Sink, MD;  Location: [facility];  Service: Endoscopy;  Laterality: N/A;   FRACTURE SURGERY Left 2014   Fibula Fracture and dislocated ankle screws and plate placed   IR IMAGING  GUIDED PORT INSERTION  06/24/2020   KIDNEY DONATION Left 2002   OVARIAN CYST REMOVAL Left 2002   Ovarian mass removal   PAIN PUMP IMPLANTATION Right 01/20/2024   Procedure: BACLOFEN  PUMP REPLACEMENT, ABDOMEN;  Surgeon: Pincus Bridgeman, DO;  Location: [facility];  Service: Neurosurgery;  Laterality: Right;   TONSILLECTOMY  1973   UPPER GASTROINTESTINAL ENDOSCOPY  11/19/2014   Benign appearing esophageal stricture-Dilated.    FAMILY HISTORY: Family History  Problem Relation Age of Onset   Diabetes Mother    Hypertension Mother    Heart disease Mother    Pulmonary embolism Mother    Diabetes Sister    Hypertension Sister    Heart disease Sister    Bipolar disorder Sister    Narcolepsy Brother    Dementia Maternal Aunt    Breast cancer Maternal Aunt    Diabetes Maternal Grandmother    Heart attack Maternal Grandmother    Diabetes Maternal Grandfather    Pancreatic cancer Maternal Grandfather     SOCIAL HISTORY:  Social History   Socioeconomic History   Marital status: Married    Spouse name: Antonio Baumgarten   Number of children: 1   Years of education: Not on file   Highest education level: Some college, no degree  Occupational History   Occupation: Disability  Tobacco Use   Smoking status: Never   Smokeless tobacco: Never  Vaping Use   Vaping status: Never Used  Substance and Sexual Activity   Alcohol use: No    Alcohol/week: 0.0 standard drinks of alcohol    Comment: rare wine on Holidays   Drug use: No   Sexual activity: Not Currently  Other Topics Concern   Not on file  Social History Narrative   Lives with husband, Crissie Leos   Right handed    Caffeine use: tea sometimes (mostly herbal)   Social Drivers of Health   Financial Resource Strain: Low Risk  (05/15/2021)   Overall Financial Resource Strain (CARDIA)    Difficulty of Paying Living Expenses: Not very hard  Food Insecurity: No Food Insecurity (01/06/2023)   Hunger Vital Sign    Worried About Running Out of  Food in the Last Year: Never true    Ran Out of Food in the Last Year: Never true  Transportation Needs: No Transportation Needs (01/06/2023)   PRAPARE - Administrator, Civil Service (Medical): No    Lack of Transportation (Non-Medical): No  Physical Activity: Not on file  Stress: Not on file  Social Connections: Not on file  Intimate Partner Violence: Not At Risk (01/06/2023)   Humiliation, Afraid, Rape, and Kick questionnaire    Fear of Current or Ex-Partner: No    Emotionally Abused: No    Physically Abused: No    Sexually Abused: No     PHYSICAL EXAM  Vitals:   04/19/24 0852  BP: 129/73  Pulse: 68  Weight: 188 lb (85.3 kg)  Height: 5\' 3"  (1.6 m)    Body mass index is 33.3 kg/m.   General: The patient is well-developed and well-nourished and in no acute distress  Skin: Extremities are without rash or edema.  Neurologic Exam  Mental status: The patient is alert and oriented x 3 at the time of the examination. The patient has apparent normal recent and remote memory, with an apparently normal attention span and concentration ability.   Speech is normal.  Cranial nerves: Extraocular movements are full.  Facial strength was normal.  No obvious hearing deficits are noted.  Motor:  Muscle bulk is normal.   She has increased muscle tone in legs.  Muscle tone is increased, left greater than right leg.  She has reduced rapid alternating movements in the left arm and strength was 4/5 in the triceps and 5/5 elsewhere.  Strength 4/5 in legs.   Sensory: Sensory testing is intact to pinprick, soft touch and vibration sensation in all 4 extremities.  Coordination: Cerebellar testing reveals good finger-nose-finger and slightly reduced heel-to-shin bilaterally.  Gait and station: Station is normal.  She can walk without a walker but has a wide stance and reduced stride.  Gait is much better with the walker.  She is unable to tandem walk.  Romberg is  negative.  Reflexes: Deep tendon reflexes are increased in the legs, left greater than right and there are crossed abductor reflexes.  Slight asymmetry in the arms.     DIAGNOSTIC DATA (LABS, IMAGING, TESTING) - I reviewed patient records, labs, notes, testing and imaging myself where available.  Lab Results  Component Value Date   WBC 6.4 04/18/2024   HGB 11.4 04/18/2024   HCT 36.5 04/18/2024   MCV 95 04/18/2024   PLT 256 04/18/2024      Component Value Date/Time   NA 138 01/17/2024 0959   NA 143 03/31/2022 1006   K 5.3 (H) 01/17/2024 0959   CL 111 01/17/2024 0959   CO2 19 (L) 01/17/2024 0959   GLUCOSE 73 01/17/2024 0959   BUN 13 01/17/2024 0959   BUN 10 03/31/2022 1006   CREATININE 0.90 01/17/2024 0959   CALCIUM 8.8 (L) 01/17/2024 0959   PROT 7.2 01/05/2023 1547   PROT 6.5 03/31/2022 1006   ALBUMIN 4.2 01/05/2023 1547   ALBUMIN 4.5 03/31/2022 1006   AST 18 01/05/2023 1547   ALT 9 01/05/2023 1547   ALKPHOS 75 01/05/2023 1547   BILITOT 0.5 01/05/2023 1547   BILITOT 0.2 03/31/2022 1006   GFRNONAA >60 01/17/2024 0959   GFRAA 76 02/07/2020 1050   Lab Results  Component Value Date   CHOL 237 (H) 04/06/2024   HDL 76 04/06/2024   LDLCALC 149 (H) 04/06/2024   TRIG 73 04/06/2024   CHOLHDL 3.1 04/06/2024   No results found for: "HGBA1C" Lab Results  Component Value Date   VITAMINB12 1,309 (H) 04/06/2024   Lab Results  Component Value Date   TSH 1.230 04/06/2024       ASSESSMENT AND PLAN  Snoring - Plan: Home sleep test  Multiple sclerosis (HCC) - Plan: baclofen  (LIORESAL ) 10 MG tablet, Home sleep test  Excessive daytime sleepiness - Plan: Home sleep test  1.   Continue Tysabri  infusions.  Will be out of town so next dose will be delayed (single dose at 8 weeks should be ok).  JCV antibody is pending and has been negative (11/02/2023)  She now has a central line.   2.    Continue topiramate  and maxalt  prn for migraine.   3.   Stay active and exercise as  tolerated. 4.   She will  continue low-dose desmopressin  at night which has helped her nocturia.    5.   She  has a baclofen  pump infusing at low dose --- only 104 mcg/day but no severe spasms on this dose.  Will adjust if needed   [facility] is now doing.     6.    Return for follow-up in 6 months  or call sooner if new or worsening neurologic symptoms.   This visit is part of a comprehensive longitudinal care medical relationship regarding the patients primary diagnosis of MS and related concerns.   Liberta Gimpel A. Godwin Lat, MD, [facility] 04/19/2024, 9:34 AM Certified in Neurology, Clinical Neurophysiology, Sleep Medicine and Neuroimaging  [facility] [address], [address] [place], [place] [address] [phone]

## 2024-04-20 DIAGNOSIS — G35 Multiple sclerosis: Secondary | ICD-10-CM | POA: Diagnosis not present

## 2024-04-27 DIAGNOSIS — G35 Multiple sclerosis: Secondary | ICD-10-CM | POA: Diagnosis not present

## 2024-05-03 ENCOUNTER — Ambulatory Visit: Payer: Medicare Other | Admitting: Neurology

## 2024-05-04 DIAGNOSIS — G35 Multiple sclerosis: Secondary | ICD-10-CM | POA: Diagnosis not present

## 2024-05-07 DIAGNOSIS — G35 Multiple sclerosis: Secondary | ICD-10-CM | POA: Diagnosis not present

## 2024-05-14 DIAGNOSIS — G35 Multiple sclerosis: Secondary | ICD-10-CM | POA: Diagnosis not present

## 2024-05-16 ENCOUNTER — Encounter: Payer: Self-pay | Admitting: Nurse Practitioner

## 2024-05-16 DIAGNOSIS — E538 Deficiency of other specified B group vitamins: Secondary | ICD-10-CM | POA: Insufficient documentation

## 2024-05-16 DIAGNOSIS — E782 Mixed hyperlipidemia: Secondary | ICD-10-CM | POA: Insufficient documentation

## 2024-05-21 DIAGNOSIS — G35 Multiple sclerosis: Secondary | ICD-10-CM | POA: Diagnosis not present

## 2024-05-22 ENCOUNTER — Ambulatory Visit: Admitting: Psychiatry

## 2024-05-28 DIAGNOSIS — G35 Multiple sclerosis: Secondary | ICD-10-CM | POA: Diagnosis not present

## 2024-06-04 DIAGNOSIS — G35 Multiple sclerosis: Secondary | ICD-10-CM | POA: Diagnosis not present

## 2024-06-06 DIAGNOSIS — G35 Multiple sclerosis: Secondary | ICD-10-CM | POA: Diagnosis not present

## 2024-06-11 ENCOUNTER — Other Ambulatory Visit: Payer: Self-pay | Admitting: Psychiatry

## 2024-06-11 DIAGNOSIS — F3342 Major depressive disorder, recurrent, in full remission: Secondary | ICD-10-CM

## 2024-06-12 DIAGNOSIS — G35 Multiple sclerosis: Secondary | ICD-10-CM | POA: Diagnosis not present

## 2024-06-13 ENCOUNTER — Other Ambulatory Visit: Payer: Self-pay

## 2024-06-13 ENCOUNTER — Ambulatory Visit: Admitting: Psychiatry

## 2024-06-13 ENCOUNTER — Encounter: Payer: Self-pay | Admitting: Psychiatry

## 2024-06-13 VITALS — BP 130/86 | HR 64 | Temp 97.4°F | Ht 63.0 in | Wt 195.8 lb

## 2024-06-13 DIAGNOSIS — G35 Multiple sclerosis: Secondary | ICD-10-CM | POA: Diagnosis not present

## 2024-06-13 DIAGNOSIS — G4701 Insomnia due to medical condition: Secondary | ICD-10-CM | POA: Diagnosis not present

## 2024-06-13 DIAGNOSIS — F3342 Major depressive disorder, recurrent, in full remission: Secondary | ICD-10-CM | POA: Diagnosis not present

## 2024-06-13 NOTE — Progress Notes (Signed)
 [facility] MD OP Progress Note  06/13/2024 1:40 PM Rose Clements  MRN:  [id number]  Chief Complaint:  Chief Complaint  Patient presents with   Follow-up   Anxiety   Depression   Medication Refill    Discussed the use of AI scribe software for clinical note transcription with the patient, who gave verbal consent to proceed.  History of Present Illness Rose Clements is a 63 year old African-American female, married, on SSD, lives in [place], has a history of MDD, multiple sclerosis on intrathecal baclofen  pump, migraine headaches, hypertension was evaluated in office today for a follow-up appointment.  She recently returned from a trip to [place]  with her daughter and son-in-law. Despite concerns about missing her infusion treatment, she was able to receive it upon her return. She found the trip enjoyable despite the heat and humidity.  She experiences ongoing issues with sleep, describing it as 'insane' and noting irregular sleep patterns during the day. She is arranging a sleep study and has not been taking trazodone , a prescribed sleep medication. During her trip, she slept more than usual, particularly after returning home, which her daughter noticed.  Her depression and anxiety are under control with her current medication regimen, which includes Wellbutrin , Cymbalta , hydroxyzine  for anxiety.  Denies suicidal thoughts or thoughts of harming others are present. She continues to see her therapist, Ellouise, although she has not had a session since May due to her travel schedule.  She denies any other concerns today.   Visit Diagnosis:    ICD-10-CM   1. MDD (major depressive disorder), recurrent, in full remission (HCC)  F33.42     2. Insomnia due to medical condition  G47.01    Mood, rule out sleep apnea, multiple sclerosis      Past Psychiatric History: I have reviewed past psychiatric history from progress note on 03/18/2023.  Past inpatient admission dated 01/07/2023 - 01/10/2023 after a  suicide attempt.  Multiple psychiatric medication trials in the past including modafinil .  Past Medical History:  Past Medical History:  Diagnosis Date   Anxiety    Constipation    Cystitis bacillary, chronic    Depression    Dysphagia    Dx by Sigrid Bathe on 10/15/14   Eosinophilic esophagitis 11/19/2014   GE junction benign stricture s/p balloon dilation, retained food contents. ?gastroparesis versus secondary to EE.   Esophageal dysphagia    GERD (gastroesophageal reflux disease)    Heartburn    Hypertension    Mental depression 2024   Migraine headache    couple times each month   Multilevel degenerative disc disease    Multiple sclerosis (HCC) 07/16/2015   Neuromuscular disorder (HCC)    Multiple Sclerosis   Port-A-Cath in place    right side   Presence of intrathecal baclofen  pump    right abdomen   Vertigo    Rare - none in last 6 months    Past Surgical History:  Procedure Laterality Date   ABDOMINAL HYSTERECTOMY  2003   baclofen  pump insertion Right 01/04/2018   CARPAL TUNNEL RELEASE Right 1997   CESAREAN SECTION  1989   COLONOSCOPY WITH PROPOFOL  N/A 10/28/2020   Procedure: COLONOSCOPY WITH PROPOFOL ;  Surgeon: Jinny Carmine, MD;  Location: [facility];  Service: Endoscopy;  Laterality: N/A;  PRIORITY 4 port a cath   CYSTECTOMY  2002   From chest   CYSTECTOMY  1969   From Chest   ESOPHAGOGASTRODUODENOSCOPY (EGD) WITH PROPOFOL  N/A 05/19/2016   Procedure: ESOPHAGOGASTRODUODENOSCOPY (EGD) WITH  PROPOFOL ;  Surgeon: Rogelia Copping, MD;  Location: [facility];  Service: Endoscopy;  Laterality: N/A;   FRACTURE SURGERY Left 2014   Fibula Fracture and dislocated ankle screws and plate placed   IR IMAGING GUIDED PORT INSERTION  06/24/2020   KIDNEY DONATION Left 2002   OVARIAN CYST REMOVAL Left 2002   Ovarian mass removal   PAIN PUMP IMPLANTATION Right 01/20/2024   Procedure: BACLOFEN  PUMP REPLACEMENT, ABDOMEN;  Surgeon: Carollee Lani BROCKS, DO;  Location: [facility] OR;   Service: Neurosurgery;  Laterality: Right;   TONSILLECTOMY  1973   UPPER GASTROINTESTINAL ENDOSCOPY  11/19/2014   Benign appearing esophageal stricture-Dilated.    Family Psychiatric History: I have reviewed family psychiatric history from progress note on 03/18/2023.  Family History:  Family History  Problem Relation Age of Onset   Diabetes Mother    Hypertension Mother    Heart disease Mother    Pulmonary embolism Mother    Diabetes Sister    Hypertension Sister    Heart disease Sister    Bipolar disorder Sister    Narcolepsy Brother    Dementia Maternal Aunt    Breast cancer Maternal Aunt    Diabetes Maternal Grandmother    Heart attack Maternal Grandmother    Diabetes Maternal Grandfather    Pancreatic cancer Maternal Grandfather     Social History: I have reviewed social history from progress note on 03/18/2023. Social History   Socioeconomic History   Marital status: Married    Spouse name: Dorina   Number of children: 1   Years of education: Not on file   Highest education level: Some college, no degree  Occupational History   Occupation: Disability  Tobacco Use   Smoking status: Never   Smokeless tobacco: Never  Vaping Use   Vaping status: Never Used  Substance and Sexual Activity   Alcohol use: No    Alcohol/week: 0.0 standard drinks of alcohol    Comment: rare wine on Holidays   Drug use: No   Sexual activity: Not Currently  Other Topics Concern   Not on file  Social History Narrative   Lives with husband, Rose Clements   Right handed    Caffeine use: tea sometimes (mostly herbal)   Social Drivers of Health   Financial Resource Strain: Low Risk  (05/15/2021)   Overall Financial Resource Strain (CARDIA)    Difficulty of Paying Living Expenses: Not very hard  Food Insecurity: No Food Insecurity (01/06/2023)   Hunger Vital Sign    Worried About Running Out of Food in the Last Year: Never true    Ran Out of Food in the Last Year: Never true   Transportation Needs: No Transportation Needs (01/06/2023)   PRAPARE - Administrator, Civil Service (Medical): No    Lack of Transportation (Non-Medical): No  Physical Activity: Not on file  Stress: Not on file  Social Connections: Not on file    Allergies:  Allergies  Allergen Reactions   Lisinopril  Hives    Metabolic Disorder Labs: No results found for: HGBA1C, MPG No results found for: PROLACTIN Lab Results  Component Value Date   CHOL 237 (H) 04/06/2024   TRIG 73 04/06/2024   HDL 76 04/06/2024   CHOLHDL 3.1 04/06/2024   LDLCALC 149 (H) 04/06/2024   LDLCALC 178 (H) 03/31/2022   Lab Results  Component Value Date   TSH 1.230 04/06/2024   TSH 2.320 03/31/2022    Therapeutic Level Labs: No results found for: LITHIUM No results  found for: VALPROATE No results found for: CBMZ  Current Medications: Current Outpatient Medications  Medication Sig Dispense Refill   baclofen  (LIORESAL ) 10 MG tablet One po tid 270 tablet 3   buPROPion  (WELLBUTRIN ) 75 MG tablet Take 1 tablet (75 mg total) by mouth in the morning. 90 tablet 1   cetirizine (ZYRTEC) 10 MG tablet Take 10 mg by mouth daily.     Cholecalciferol (VITAMIN D3) 1000 UNITS CAPS Take 1,000 Units by mouth in the morning and at bedtime.      cyanocobalamin  (VITAMIN B12) 1000 MCG tablet Take 1,000 mcg by mouth daily.     desmopressin  (DDAVP ) 0.1 MG tablet Take 1 tablet (100 mcg total) by mouth daily. 90 tablet 3   DULoxetine  (CYMBALTA ) 20 MG capsule Take 1 capsule (20 mg total) by mouth daily. Take along with 60 mg daily 90 capsule 1   DULoxetine  (CYMBALTA ) 60 MG capsule Take 1 capsule (60 mg total) by mouth daily. Take along with 20 mg daily 90 capsule 1   fluticasone  (FLONASE ) 50 MCG/ACT nasal spray SHAKE LIQUID AND USE 2 SPRAYS IN EACH NOSTRIL EVERY DAY 48 g 3   hydrOXYzine  (ATARAX ) 25 MG tablet Take 1 tablet (25 mg total) by mouth 3 (three) times daily as needed for anxiety. 90 tablet 2    natalizumab  (TYSABRI ) 300 MG/15ML injection Inject 300 mg into the vein every 30 (thirty) days.      oxyCODONE  (OXY IR/ROXICODONE ) 5 MG immediate release tablet Take 1 tablet (5 mg total) by mouth every 6 (six) hours as needed for severe pain (pain score 7-10). 20 tablet 0   pantoprazole  (PROTONIX ) 40 MG tablet Take 1 tablet (40 mg total) by mouth daily. 90 tablet 1   polyethylene glycol (MIRALAX  / GLYCOLAX ) 17 g packet Take 17 g by mouth daily. 28 each 1   rizatriptan  (MAXALT -MLT) 10 MG disintegrating tablet Take 1 tablet (10 mg total) by mouth as needed for migraine. May repeat in 2 hours if needed 12 tablet 11   topiramate  (TOPAMAX ) 50 MG tablet Take 1 tablet (50 mg total) by mouth daily AND 2 tablets (100 mg total) at bedtime. 270 tablet 3   traZODone  (DESYREL ) 50 MG tablet Take 0.5-1 tablets (25-50 mg total) by mouth at bedtime as needed for sleep. 30 tablet 1   No current facility-administered medications for this visit.     Musculoskeletal: Strength & Muscle Tone: WNL Gait & Station: Uses a walker Patient leans: N/A  Psychiatric Specialty Exam: Review of Systems  Psychiatric/Behavioral:  Positive for sleep disturbance.     Blood pressure 130/86, pulse 64, temperature (!) 97.4 F (36.3 C), temperature source Temporal, height 5' 3 (1.6 m), weight 195 lb 12.8 oz (88.8 kg).Body mass index is 34.68 kg/m.  General Appearance: Casual  Eye Contact:  Fair  Speech:  Clear and Coherent  Volume:  Normal  Mood:  Euthymic  Affect:  Congruent  Thought Process:  Goal Directed and Descriptions of Associations: Intact  Orientation:  Full (Time, Place, and Person)  Thought Content: Logical   Suicidal Thoughts:  No  Homicidal Thoughts:  No  Memory:  Immediate;   Fair Recent;   Fair Remote;   Fair  Judgement:  Fair  Insight:  Fair  Psychomotor Activity:  Normal  Concentration:  Concentration: Fair and Attention Span: Fair  Recall:  [employer] of Knowledge: Fair  Language: Fair   Akathisia:  No  Handed:  Right  AIMS (if indicated): done  Assets:  Communication Skills Desire for Improvement Housing Social Support Transportation  ADL's:  Intact  Cognition: WNL  Sleep:  excessive   Screenings: AIMS    Flowsheet Row Office Visit from 06/13/2024 in [facility]  AIMS Total Score 0   AUDIT    Flowsheet Row Admission (Discharged) from 01/06/2023 in [facility]  Alcohol Use Disorder Identification Test Final Score (AUDIT) 0   GAD-7    Flowsheet Row Office Visit from 06/13/2024 in [facility] Office Visit from 02/23/2024 in [facility] Office Visit from 08/25/2023 in [facility] Office Visit from 06/30/2023 in [facility] Office Visit from 04/28/2023 in [facility]  Total GAD-7 Score 4 4 4 6 9    Mini-Mental    Flowsheet Row Office Visit from 04/04/2024 in [facility] Office Visit from 04/01/2023 in [facility] Office Visit from 03/26/2022 in [facility] Support from 03/03/2021 in [facility]  Total Score (max 30 points ) 30 30 30 30    PHQ2-9    Flowsheet Row Office Visit from 06/13/2024 in [facility] Office Visit from 02/23/2024 in [facility] Office Visit from 11/25/2023 in [facility] Office Visit from 08/25/2023 in [facility] Office Visit from 06/30/2023 in [facility]  PHQ-2 Total Score 1 2 1 2 4   PHQ-9 Total Score -- 9 -- 10 16   Flowsheet Row Office Visit from 06/13/2024 in [facility]  Office Visit from 02/23/2024 in [facility] Admission (Discharged) from 01/20/2024 in [facility]  C-SSRS RISK CATEGORY Moderate Risk Moderate Risk No Risk     Assessment and Plan: Miyo Aina is a 63 year old African-American female who has a history of depression, insomnia presented for a follow-up appointment, discussed assessment and plan as noted below.  Major depression in remission Currently denies any significant depression symptoms. Continue Duloxetine  60 mg in the evening and 20 mg in the morning Continue Wellbutrin  75 mg daily Continue Hydroxyzine  25 mg 3 times a day as needed. Continue psychotherapy sessions with Ms. Ellouise Hummer.  Insomnia-unstable Continues to struggle with sleep hygiene, excessive sleepiness during the day, fatigue.  Does have trazodone  available although has been noncompliant. Encouraged sleep hygiene techniques Encouraged to use Trazodone  25 to 50 mg at bedtime as needed Encouraged to complete sleep study, she was referred for the same.  Follow-up Follow-up in clinic in 3 months or sooner if needed.  Collaboration of Care: Collaboration of Care: Referral or follow-up with counselor/therapist AEB encouraged to continue follow-up with therapist Ms. Ellouise Hummer.  Patient/Guardian was advised Release of Information must be obtained prior to any record release in order to collaborate their care with an outside provider. Patient/Guardian was advised if they have not already done so to contact the registration department to sign all necessary forms in order for us  to release information regarding their care.   Consent: Patient/Guardian gives verbal consent for treatment and assignment of benefits for services provided during this visit. Patient/Guardian expressed understanding and agreed to proceed.   This note was generated in part or whole with voice recognition software. Voice recognition is  usually quite accurate but there are transcription errors that can and very  often do occur. I apologize for any typographical errors that were not detected and corrected.    Britnay Magnussen, MD 06/14/2024, 8:25 AM

## 2024-06-13 NOTE — Progress Notes (Unsigned)
 error

## 2024-06-20 DIAGNOSIS — G35 Multiple sclerosis: Secondary | ICD-10-CM | POA: Diagnosis not present

## 2024-06-27 DIAGNOSIS — G35 Multiple sclerosis: Secondary | ICD-10-CM | POA: Diagnosis not present

## 2024-07-04 DIAGNOSIS — G35 Multiple sclerosis: Secondary | ICD-10-CM | POA: Diagnosis not present

## 2024-07-07 DIAGNOSIS — G35 Multiple sclerosis: Secondary | ICD-10-CM | POA: Diagnosis not present

## 2024-07-10 DIAGNOSIS — G35 Multiple sclerosis: Secondary | ICD-10-CM | POA: Diagnosis not present

## 2024-07-14 DIAGNOSIS — G35 Multiple sclerosis: Secondary | ICD-10-CM | POA: Diagnosis not present

## 2024-07-21 DIAGNOSIS — G35 Multiple sclerosis: Secondary | ICD-10-CM | POA: Diagnosis not present

## 2024-07-28 DIAGNOSIS — G35 Multiple sclerosis: Secondary | ICD-10-CM | POA: Diagnosis not present

## 2024-08-04 DIAGNOSIS — G35 Multiple sclerosis: Secondary | ICD-10-CM | POA: Diagnosis not present

## 2024-08-07 DIAGNOSIS — G35 Multiple sclerosis: Secondary | ICD-10-CM | POA: Diagnosis not present

## 2024-08-14 DIAGNOSIS — G35 Multiple sclerosis: Secondary | ICD-10-CM | POA: Diagnosis not present

## 2024-08-14 DIAGNOSIS — M6249 Contracture of muscle, multiple sites: Secondary | ICD-10-CM | POA: Diagnosis not present

## 2024-08-16 ENCOUNTER — Telehealth: Payer: Self-pay | Admitting: *Deleted

## 2024-08-16 NOTE — Telephone Encounter (Signed)
 Rose Clements

## 2024-08-21 DIAGNOSIS — G35 Multiple sclerosis: Secondary | ICD-10-CM | POA: Diagnosis not present

## 2024-08-28 ENCOUNTER — Other Ambulatory Visit: Payer: Self-pay | Admitting: Psychiatry

## 2024-08-28 DIAGNOSIS — F3342 Major depressive disorder, recurrent, in full remission: Secondary | ICD-10-CM

## 2024-08-28 DIAGNOSIS — G35 Multiple sclerosis: Secondary | ICD-10-CM | POA: Diagnosis not present

## 2024-09-04 DIAGNOSIS — G35 Multiple sclerosis: Secondary | ICD-10-CM | POA: Diagnosis not present

## 2024-09-06 DIAGNOSIS — G35D Multiple sclerosis, unspecified: Secondary | ICD-10-CM | POA: Diagnosis not present

## 2024-09-10 ENCOUNTER — Other Ambulatory Visit: Payer: Self-pay | Admitting: Nurse Practitioner

## 2024-09-10 DIAGNOSIS — Z1231 Encounter for screening mammogram for malignant neoplasm of breast: Secondary | ICD-10-CM

## 2024-09-11 ENCOUNTER — Telehealth: Payer: Self-pay | Admitting: Nurse Practitioner

## 2024-09-11 DIAGNOSIS — G35A Relapsing-remitting multiple sclerosis: Secondary | ICD-10-CM | POA: Diagnosis not present

## 2024-09-11 NOTE — Telephone Encounter (Signed)
 Lvm notifying patient of mammogram appointment date, arrival time, location-[place]

## 2024-09-12 ENCOUNTER — Other Ambulatory Visit: Payer: Self-pay | Admitting: Neurology

## 2024-09-12 DIAGNOSIS — Z95828 Presence of other vascular implants and grafts: Secondary | ICD-10-CM

## 2024-09-12 DIAGNOSIS — G35D Multiple sclerosis, unspecified: Secondary | ICD-10-CM

## 2024-09-13 ENCOUNTER — Other Ambulatory Visit: Payer: Self-pay

## 2024-09-13 ENCOUNTER — Ambulatory Visit: Admitting: Psychiatry

## 2024-09-13 ENCOUNTER — Encounter (HOSPITAL_COMMUNITY): Payer: Self-pay

## 2024-09-13 ENCOUNTER — Encounter: Payer: Self-pay | Admitting: Psychiatry

## 2024-09-13 ENCOUNTER — Encounter: Payer: Self-pay | Admitting: Neurology

## 2024-09-13 VITALS — BP 131/83 | HR 65 | Temp 97.4°F | Ht 63.0 in | Wt 193.2 lb

## 2024-09-13 DIAGNOSIS — Z978 Presence of other specified devices: Secondary | ICD-10-CM | POA: Insufficient documentation

## 2024-09-13 DIAGNOSIS — F3342 Major depressive disorder, recurrent, in full remission: Secondary | ICD-10-CM | POA: Diagnosis not present

## 2024-09-13 DIAGNOSIS — G35D Multiple sclerosis, unspecified: Secondary | ICD-10-CM | POA: Diagnosis not present

## 2024-09-13 DIAGNOSIS — G4701 Insomnia due to medical condition: Secondary | ICD-10-CM | POA: Diagnosis not present

## 2024-09-13 DIAGNOSIS — G4700 Insomnia, unspecified: Secondary | ICD-10-CM

## 2024-09-13 MED ORDER — BUPROPION HCL 75 MG PO TABS: 75.0000 mg | ORAL_TABLET | Freq: Every morning | ORAL | 3 refills | Status: AC

## 2024-09-13 MED ORDER — TRAZODONE HCL 50 MG PO TABS
25.0000 mg | ORAL_TABLET | Freq: Every evening | ORAL | 1 refills | Status: AC | PRN
Start: 2024-09-13 — End: ?

## 2024-09-13 MED ORDER — DULOXETINE HCL 20 MG PO CPEP: 20.0000 mg | ORAL_CAPSULE | Freq: Every day | ORAL | 3 refills | Status: DC

## 2024-09-13 NOTE — Telephone Encounter (Signed)
 Last seen on 04/19/24 Follow up scheduled on 11/01/24

## 2024-09-13 NOTE — Progress Notes (Signed)
 [facility] MD OP Progress Note  09/13/2024 3:34 PM Rose Clements  MRN:  [id number]  Chief Complaint:  Chief Complaint  Patient presents with   Follow-up   Anxiety   Depression   Medication Refill   Discussed the use of AI scribe software for clinical note transcription with the patient, who gave verbal consent to proceed.  History of Present Illness Rose Clements is a 63 year old African-American female, married, on SSD, lives in [place], has a history of MDD, multiple sclerosis on intrathecal baclofen  pump, migraine headaches, hypertension was evaluated in office today for a follow-up appointment.  Since her last visit in July, she reports that her mood has remained stable. Overall, she describes feeling well, with only minimal depressive symptoms in the past 2 weeks, rating her mood as 1 or 2 out of 10 and noting these feelings as brief and not persistent. She denies experiencing significant sadness or hopelessness and states that she has not returned to the level of deep depression she experienced previously. She continues to engage in activities she enjoys, such as attending Bible study and church, which allows her to get out more frequently. She describes her relationship with her husband as supportive and notes that he is engaged in therapy through the [facility].  Regarding sleep, she describes improvement, stating that she has been sleeping longer at night by turning off the TV and working on her sleep hygiene. Occasionally, she sleeps off and on during the day but notes improvement in her nighttime sleep. She mentions that her trazodone  prescription for sleep has expired and that she may need a refill.  Over the past 2 weeks, she denies significant anxiety symptoms, except for mild worry in the last day related to concerns about her medical port. She denies trouble relaxing, restlessness, irritability, or feeling afraid that something awful might happen. She reports stable appetite and no recent  changes in weight.  Her current medication regimen includes Wellbutrin  75 mg, duloxetine  20 mg and 60 mg, hydroxyzine  25 mg three times a day as needed, trazodone  (for sleep, taken infrequently).   She denies any other concerns today.   Visit Diagnosis:    ICD-10-CM   1. MDD (major depressive disorder), recurrent, in full remission  F33.42 buPROPion  (WELLBUTRIN ) 75 MG tablet    2. Insomnia due to medical condition  G47.01 traZODone  (DESYREL ) 50 MG tablet   Mood, rule out sleep apnea, multiple sclerosis      Past Psychiatric History: I have reviewed past psychiatric history from progress note on 03/18/2023.  Past inpatient admission dated 01/07/2023 - 01/10/2023 after suicide attempt.  Multiple psychiatric medication trials in the past including modafinil .  Past Medical History:  Past Medical History:  Diagnosis Date   Anxiety    Constipation    Cystitis bacillary, chronic    Depression    Dysphagia    Dx by Sigrid Bathe on 10/15/14   Eosinophilic esophagitis 11/19/2014   GE junction benign stricture s/p balloon dilation, retained food contents. ?gastroparesis versus secondary to EE.   Esophageal dysphagia    GERD (gastroesophageal reflux disease)    Heartburn    Hypertension    Mental depression 2024   Migraine headache    couple times each month   Multilevel degenerative disc disease    Multiple sclerosis 07/16/2015   Neuromuscular disorder (HCC)    Multiple Sclerosis   Port-A-Cath in place    right side   Presence of intrathecal baclofen  pump    right abdomen   Vertigo  Rare - none in last 6 months    Past Surgical History:  Procedure Laterality Date   ABDOMINAL HYSTERECTOMY  2003   baclofen  pump insertion Right 01/04/2018   CARPAL TUNNEL RELEASE Right 1997   CESAREAN SECTION  1989   COLONOSCOPY WITH PROPOFOL  N/A 10/28/2020   Procedure: COLONOSCOPY WITH PROPOFOL ;  Surgeon: Jinny Carmine, MD;  [facility];  Service: Endoscopy;  Laterality: N/A;   PRIORITY 4 port a cath   CYSTECTOMY  2002   From chest   CYSTECTOMY  1969   From Chest   ESOPHAGOGASTRODUODENOSCOPY (EGD) WITH PROPOFOL  N/A 05/19/2016   Procedure: ESOPHAGOGASTRODUODENOSCOPY (EGD) WITH PROPOFOL ;  Surgeon: Carmine Jinny, MD;  Location: [facility];  Service: Endoscopy;  Laterality: N/A;   FRACTURE SURGERY Left 2014   Fibula Fracture and dislocated ankle screws and plate placed   IR IMAGING GUIDED PORT INSERTION  06/24/2020   KIDNEY DONATION Left 2002   OVARIAN CYST REMOVAL Left 2002   Ovarian mass removal   PAIN PUMP IMPLANTATION Right 01/20/2024   Procedure: BACLOFEN  PUMP REPLACEMENT, ABDOMEN;  Surgeon: Carollee Lani BROCKS, DO;  Location: [facility] OR;  Service: Neurosurgery;  Laterality: Right;   TONSILLECTOMY  1973   UPPER GASTROINTESTINAL ENDOSCOPY  11/19/2014   Benign appearing esophageal stricture-Dilated.    Family Psychiatric History: I have reviewed family psychiatric history from progress note on 03/18/2023.  Family History:  Family History  Problem Relation Age of Onset   Diabetes Mother    Hypertension Mother    Heart disease Mother    Pulmonary embolism Mother    Diabetes Sister    Hypertension Sister    Heart disease Sister    Bipolar disorder Sister    Narcolepsy Brother    Dementia Maternal Aunt    Breast cancer Maternal Aunt    Diabetes Maternal Grandmother    Heart attack Maternal Grandmother    Diabetes Maternal Grandfather    Pancreatic cancer Maternal Grandfather     Social History: I have reviewed social history from progress note on 03/18/2023. Social History   Socioeconomic History   Marital status: Married    Spouse name: Dorina   Number of children: 1   Years of education: Not on file   Highest education level: Some college, no degree  Occupational History   Occupation: Disability  Tobacco Use   Smoking status: Never   Smokeless tobacco: Never  Vaping Use   Vaping status: Never Used  Substance and Sexual Activity   Alcohol use:  No    Alcohol/week: 0.0 standard drinks of alcohol    Comment: rare wine on Holidays   Drug use: No   Sexual activity: Not Currently  Other Topics Concern   Not on file  Social History Narrative   Lives with husband, Shirah Roseman   Right handed    Caffeine use: tea sometimes (mostly herbal)   Social Drivers of Health   Financial Resource Strain: Low Risk  (05/15/2021)   Overall Financial Resource Strain (CARDIA)    Difficulty of Paying Living Expenses: Not very hard  Food Insecurity: No Food Insecurity (01/06/2023)   Hunger Vital Sign    Worried About Running Out of Food in the Last Year: Never true    Ran Out of Food in the Last Year: Never true  Transportation Needs: No Transportation Needs (01/06/2023)   PRAPARE - Administrator, Civil Service (Medical): No    Lack of Transportation (Non-Medical): No  Physical Activity: Not on file  Stress: Not on file  Social Connections: Not on file    Allergies:  Allergies  Allergen Reactions   Lisinopril  Hives    Metabolic Disorder Labs: No results found for: HGBA1C, MPG No results found for: PROLACTIN Lab Results  Component Value Date   CHOL 237 (H) 04/06/2024   TRIG 73 04/06/2024   HDL 76 04/06/2024   CHOLHDL 3.1 04/06/2024   LDLCALC 149 (H) 04/06/2024   LDLCALC 178 (H) 03/31/2022   Lab Results  Component Value Date   TSH 1.230 04/06/2024   TSH 2.320 03/31/2022    Therapeutic Level Labs: No results found for: LITHIUM No results found for: VALPROATE No results found for: CBMZ  Current Medications: Current Outpatient Medications  Medication Sig Dispense Refill   baclofen  (LIORESAL ) 10 MG tablet One po tid 270 tablet 3   buPROPion  (WELLBUTRIN ) 75 MG tablet Take 1 tablet (75 mg total) by mouth in the morning. 90 tablet 3   cetirizine (ZYRTEC) 10 MG tablet Take 10 mg by mouth daily.     Cholecalciferol (VITAMIN D3) 1000 UNITS CAPS Take 1,000 Units by mouth in the morning and at bedtime.       cyanocobalamin  (VITAMIN B12) 1000 MCG tablet Take 1,000 mcg by mouth daily.     desmopressin  (DDAVP ) 0.1 MG tablet TAKE 1 TABLET BY MOUTH EVERY DAY 90 tablet 0   DULoxetine  (CYMBALTA ) 20 MG capsule TAKE ONE CAPSULE BY MOUTH DAILY, WITH 60 MG 90 capsule 3   DULoxetine  (CYMBALTA ) 60 MG capsule Take 1 capsule (60 mg total) by mouth daily. Take along with 20 mg daily 90 capsule 1   fluticasone  (FLONASE ) 50 MCG/ACT nasal spray SHAKE LIQUID AND USE 2 SPRAYS IN EACH NOSTRIL EVERY DAY 48 g 3   hydrOXYzine  (ATARAX ) 25 MG tablet Take 1 tablet (25 mg total) by mouth 3 (three) times daily as needed for anxiety. 90 tablet 2   natalizumab  (TYSABRI ) 300 MG/15ML injection Inject 300 mg into the vein every 30 (thirty) days.      oxyCODONE  (OXY IR/ROXICODONE ) 5 MG immediate release tablet Take 1 tablet (5 mg total) by mouth every 6 (six) hours as needed for severe pain (pain score 7-10). 20 tablet 0   pantoprazole  (PROTONIX ) 40 MG tablet Take 1 tablet (40 mg total) by mouth daily. 90 tablet 1   polyethylene glycol (MIRALAX  / GLYCOLAX ) 17 g packet Take 17 g by mouth daily. 28 each 1   rizatriptan  (MAXALT -MLT) 10 MG disintegrating tablet Take 1 tablet (10 mg total) by mouth as needed for migraine. May repeat in 2 hours if needed 12 tablet 11   topiramate  (TOPAMAX ) 50 MG tablet Take 1 tablet (50 mg total) by mouth daily AND 2 tablets (100 mg total) at bedtime. 270 tablet 3   traZODone  (DESYREL ) 50 MG tablet Take 0.5-1 tablets (25-50 mg total) by mouth at bedtime as needed for sleep. 90 tablet 1   No current facility-administered medications for this visit.     Musculoskeletal: Strength & Muscle Tone: due to MS does have difficulties  Gait & Station: uses a cane Patient leans: N/A  Psychiatric Specialty Exam: Review of Systems  Psychiatric/Behavioral:  Positive for sleep disturbance.     Blood pressure 131/83, pulse 65, temperature (!) 97.4 F (36.3 C), temperature source Temporal, height 5' 3 (1.6 m), weight  193 lb 3.2 oz (87.6 kg).Body mass index is 34.22 kg/m.  General Appearance: Casual  Eye Contact:  Fair  Speech:  Clear and Coherent  Volume:  Normal  Mood:  Euthymic  Affect:  Congruent  Thought Process:  Goal Directed and Descriptions of Associations: Intact  Orientation:  Full (Time, Place, and Person)  Thought Content: Logical   Suicidal Thoughts:  No  Homicidal Thoughts:  No  Memory:  Immediate;   Fair Recent;   Fair Remote;   Fair  Judgement:  Fair  Insight:  Fair  Psychomotor Activity:  Normal  Concentration:  Concentration: Fair and Attention Span: Fair  Recall:  [employer] of Knowledge: Fair  Language: Fair  Akathisia:  No  Handed:  Right  AIMS (if indicated): not done  Assets:  Communication Skills Desire for Improvement Housing Social Support Transportation  ADL's:  Intact  Cognition: WNL  Sleep:  varies   Screenings: AIMS    Flowsheet Row Office Visit from 06/13/2024 in [facility]  AIMS Total Score 0   AUDIT    Flowsheet Row Admission (Discharged) from 01/06/2023 in [facility]  Alcohol Use Disorder Identification Test Final Score (AUDIT) 0   GAD-7    Flowsheet Row Office Visit from 09/13/2024 in [facility] Office Visit from 06/13/2024 in [facility] Office Visit from 02/23/2024 in [facility] Office Visit from 08/25/2023 in [facility] Office Visit from 06/30/2023 in [facility]  Total GAD-7 Score 2 4 4 4 6    Mini-Mental    Flowsheet Row Office Visit from 04/04/2024 in [facility] Office Visit from 04/01/2023 in [facility] Office Visit from 03/26/2022 in [facility] Clinical Support from 03/03/2021 in [facility]  Total Score  (max 30 points ) 30 30 30 30    PHQ2-9    Flowsheet Row Office Visit from 09/13/2024 in [facility] Office Visit from 06/13/2024 in [facility] Office Visit from 02/23/2024 in [facility] Office Visit from 11/25/2023 in [facility] Office Visit from 08/25/2023 in [facility]  PHQ-2 Total Score 1 1 2 1 2   PHQ-9 Total Score -- -- 9 -- 10   Flowsheet Row Office Visit from 09/13/2024 in [facility] Office Visit from 06/13/2024 in [facility] Office Visit from 02/23/2024 in [facility]  C-SSRS RISK CATEGORY Moderate Risk Moderate Risk Moderate Risk     Assessment and Plan: Chantee Cerino is a 63 year old African-American female who has a history of depression, insomnia was evaluated in office today for a follow-up appointment.  Discussed assessment and plan as noted below.  1. MDD (major depressive disorder), recurrent, in full remission Currently denies any depression symptoms. Continue Duloxetine  80 mg daily in the morning Continue Wellbutrin  75 mg daily Continue Hydroxyzine  25 mg 3 times a day as needed  2. Insomnia due to medical condition-stable Currently reports sleep is overall good although she does have pain which does affect her sleep at times Continue Trazodone  25-50 mg at bedtime as needed  Follow-up Follow-up in clinic in 3 months or sooner if needed.   Collaboration of Care: Collaboration of Care: Referral or follow-up with counselor/therapist AEB patient encouraged to follow-up with therapist Ms. Ellouise Hummer.  I have coordinated care.  Patient/Guardian was advised Release of Information must be obtained prior to any record  release in order to collaborate their  care with an outside provider. Patient/Guardian was advised if they have not already done so to contact the registration department to sign all necessary forms in order for us  to release information regarding their care.   Consent: Patient/Guardian gives verbal consent for treatment and assignment of benefits for services provided during this visit. Patient/Guardian expressed understanding and agreed to proceed.   This note was generated in part or whole with voice recognition software. Voice recognition is usually quite accurate but there are transcription errors that can and very often do occur. I apologize for any typographical errors that were not detected and corrected.    Kahmari Herard, MD 09/15/2024, 8:19 AM

## 2024-09-14 ENCOUNTER — Other Ambulatory Visit: Payer: Self-pay | Admitting: Psychiatry

## 2024-09-14 DIAGNOSIS — G4701 Insomnia due to medical condition: Secondary | ICD-10-CM

## 2024-09-14 NOTE — Telephone Encounter (Signed)
 Pt is all set up , Order is Scheduled

## 2024-09-15 DIAGNOSIS — F3342 Major depressive disorder, recurrent, in full remission: Secondary | ICD-10-CM | POA: Insufficient documentation

## 2024-09-20 DIAGNOSIS — M6249 Contracture of muscle, multiple sites: Secondary | ICD-10-CM | POA: Diagnosis not present

## 2024-09-20 DIAGNOSIS — G35D Multiple sclerosis, unspecified: Secondary | ICD-10-CM | POA: Diagnosis not present

## 2024-09-22 ENCOUNTER — Other Ambulatory Visit: Payer: Self-pay | Admitting: Radiology

## 2024-09-25 ENCOUNTER — Other Ambulatory Visit: Payer: Self-pay | Admitting: Neurology

## 2024-09-25 ENCOUNTER — Ambulatory Visit (HOSPITAL_COMMUNITY)
Admission: RE | Admit: 2024-09-25 | Discharge: 2024-09-25 | Disposition: A | Discharge status: Home / Self Care | Source: Ambulatory Visit | Attending: Neurology | Admitting: Neurology

## 2024-09-25 ENCOUNTER — Encounter (HOSPITAL_COMMUNITY): Payer: Self-pay

## 2024-09-25 DIAGNOSIS — G35D Multiple sclerosis, unspecified: Secondary | ICD-10-CM

## 2024-09-25 DIAGNOSIS — Z95828 Presence of other vascular implants and grafts: Secondary | ICD-10-CM

## 2024-09-25 DIAGNOSIS — T82828A Fibrosis of vascular prosthetic devices, implants and grafts, initial encounter: Secondary | ICD-10-CM | POA: Diagnosis not present

## 2024-09-25 HISTORY — PX: IR CV LINE INJECTION: IMG2294

## 2024-09-25 HISTORY — PX: IR REMOVE CV FIBRIN SHEATH: IMG699

## 2024-09-25 MED ORDER — IOHEXOL 300 MG/ML  SOLN
50.0000 mL | Freq: Once | INTRAMUSCULAR | Status: AC | PRN
  Administered 2024-09-25: 20 mL via INTRA_ARTERIAL

## 2024-09-25 MED ORDER — FENTANYL CITRATE (PF) 100 MCG/2ML IJ SOLN
INTRAMUSCULAR | Status: AC | PRN
  Administered 2024-09-25: 25 ug via INTRAVENOUS
  Administered 2024-09-25: 50 ug via INTRAVENOUS

## 2024-09-25 MED ORDER — MIDAZOLAM HCL 2 MG/2ML IJ SOLN
INTRAMUSCULAR | Status: AC
  Filled 2024-09-25: qty 2

## 2024-09-25 MED ORDER — LIDOCAINE HCL 1 % IJ SOLN
INTRAMUSCULAR | Status: AC
  Filled 2024-09-25: qty 20

## 2024-09-25 MED ORDER — HEPARIN SOD (PORK) LOCK FLUSH 100 UNIT/ML IV SOLN
INTRAVENOUS | Status: AC
  Filled 2024-09-25: qty 5

## 2024-09-25 MED ORDER — FENTANYL CITRATE (PF) 100 MCG/2ML IJ SOLN
INTRAMUSCULAR | Status: AC
  Filled 2024-09-25: qty 2

## 2024-09-25 MED ORDER — MIDAZOLAM HCL (PF) 2 MG/2ML IJ SOLN
INTRAMUSCULAR | Status: AC | PRN
  Administered 2024-09-25: .5 mg via INTRAVENOUS
  Administered 2024-09-25: 1 mg via INTRAVENOUS

## 2024-09-25 MED ORDER — LIDOCAINE HCL 1 % IJ SOLN
20.0000 mL | Freq: Once | INTRAMUSCULAR | Status: AC
Start: 2024-09-25 — End: 2024-09-25
  Administered 2024-09-25: 10 mL

## 2024-09-25 MED ORDER — SODIUM CHLORIDE 0.9 % IV SOLN: INTRAVENOUS | Status: DC

## 2024-09-25 NOTE — Consult Note (Signed)
 Chief Complaint: Portacath unable to aspirate. Patient presents for catheterogram and possible intervention.  Referring Physician(s): Sater,Richard A  Supervising Physician: Vanice Revel  Patient Status: [facility] - Out-pt  History of Present Illness: Rose Clements is a 63 y.o. female outpatient. History of multiple sclerosis with RIJ portacath placed by IR on 7.19.21 for durable IV. Team states that the port flushed but they are unable to aspirate. Team is requesting a portacath evaluation.   Currently without any significant complaints. Patient alert and laying in bed,calm. Denies any fevers, headache, chest pain, SOB, cough, abdominal pain, nausea, vomiting or bleeding.   No recent labs. All medications are within acceptable parameters. No pertinent images. Patient has been NPO since midnight.   Return precautions and treatment recommendations and follow-up discussed with the patient  who is agreeable with the plan.   Past Medical History:  Diagnosis Date   Anxiety    Constipation    Cystitis bacillary, chronic    Depression    Dysphagia    Dx by Sigrid Bathe on 10/15/14   Eosinophilic esophagitis 11/19/2014   GE junction benign stricture s/p balloon dilation, retained food contents. ?gastroparesis versus secondary to EE.   Esophageal dysphagia    GERD (gastroesophageal reflux disease)    Heartburn    Hypertension    Mental depression 2024   Migraine headache    couple times each month   Multilevel degenerative disc disease    Multiple sclerosis 07/16/2015   Neuromuscular disorder (HCC)    Multiple Sclerosis   Port-A-Cath in place    right side   Presence of intrathecal baclofen  pump    right abdomen   Vertigo    Rare - none in last 6 months    Past Surgical History:  Procedure Laterality Date   ABDOMINAL HYSTERECTOMY  2003   baclofen  pump insertion Right 01/04/2018   CARPAL TUNNEL RELEASE Right 1997   CESAREAN SECTION  1989   COLONOSCOPY WITH PROPOFOL  N/A  10/28/2020   Procedure: COLONOSCOPY WITH PROPOFOL ;  Surgeon: Jinny Carmine, MD;  Location: [facility];  Service: Endoscopy;  Laterality: N/A;  PRIORITY 4 port a cath   CYSTECTOMY  2002   From chest   CYSTECTOMY  1969   From Chest   ESOPHAGOGASTRODUODENOSCOPY (EGD) WITH PROPOFOL  N/A 05/19/2016   Procedure: ESOPHAGOGASTRODUODENOSCOPY (EGD) WITH PROPOFOL ;  Surgeon: Carmine Jinny, MD;  Location: [facility] END[facility]COPY;  Service: Endoscopy;  Laterality: N/A;   FRACTURE SURGERY Left 2014   Fibula Fracture and dislocated ankle screws and plate placed   IR IMAGING GUIDED PORT INSERTION  06/24/2020   IR PORT REPAIR CENTRAL VENOUS ACCESS DEVICE  09/25/2024   KIDNEY DONATION Left 2002   OVARIAN CYST REMOVAL Left 2002   Ovarian mass removal   PAIN PUMP IMPLANTATION Right 01/20/2024   Procedure: BACLOFEN  PUMP REPLACEMENT, ABDOMEN;  Surgeon: Carollee Lani BROCKS, DO;  Location: [facility] OR;  Service: Neurosurgery;  Laterality: Right;   TONSILLECTOMY  1973   UPPER GASTROINTESTINAL ENDOSCOPY  11/19/2014   Benign appearing esophageal stricture-Dilated.    Allergies: Lisinopril   Medications: Prior to Admission medications   Medication Sig Start Date End Date Taking? Authorizing Provider  buPROPion  (WELLBUTRIN ) 75 MG tablet Take 1 tablet (75 mg total) by mouth in the morning. 09/13/24 09/13/25 Yes Eappen, Saramma, MD  cetirizine (ZYRTEC) 10 MG tablet Take 10 mg by mouth daily.   Yes [provider]  cyanocobalamin  (VITAMIN B12) 1000 MCG tablet Take 1,000 mcg by mouth daily.   Yes [provider]  desmopressin  (DDAVP ) 0.1 MG tablet TAKE 1 TABLET BY MOUTH EVERY DAY 09/13/24  Yes Sater, Charlie LABOR, MD  DULoxetine  (CYMBALTA ) 20 MG capsule TAKE ONE CAPSULE BY MOUTH DAILY, WITH 60 MG 09/14/24  Yes Eappen, Saramma, MD  DULoxetine  (CYMBALTA ) 60 MG capsule Take 1 capsule (60 mg total) by mouth daily. Take along with 20 mg daily 03/09/24  Yes Eappen, Saramma, MD  fluticasone  (FLONASE ) 50 MCG/ACT nasal spray SHAKE  LIQUID AND USE 2 SPRAYS IN EACH NOSTRIL EVERY DAY 04/04/24  Yes Abernathy, Alyssa, NP  hydrOXYzine  (ATARAX ) 25 MG tablet Take 1 tablet (25 mg total) by mouth 3 (three) times daily as needed for anxiety. 11/25/23  Yes Eappen, Saramma, MD  pantoprazole  (PROTONIX ) 40 MG tablet Take 1 tablet (40 mg total) by mouth daily. 04/04/24  Yes Abernathy, Alyssa, NP  polyethylene glycol (MIRALAX  / GLYCOLAX ) 17 g packet Take 17 g by mouth daily. 01/11/23  Yes Clapacs, Norleen DASEN, MD  topiramate  (TOPAMAX ) 50 MG tablet Take 1 tablet (50 mg total) by mouth daily AND 2 tablets (100 mg total) at bedtime. 04/19/24  Yes Sater, Charlie LABOR, MD  baclofen  (LIORESAL ) 10 MG tablet One po tid 04/19/24   Sater, Charlie LABOR, MD  Cholecalciferol (VITAMIN D3) 1000 UNITS CAPS Take 1,000 Units by mouth in the morning and at bedtime.     [provider]  natalizumab  (TYSABRI ) 300 MG/15ML injection Inject 300 mg into the vein every 30 (thirty) days.     [provider]  oxyCODONE  (OXY IR/ROXICODONE ) 5 MG immediate release tablet Take 1 tablet (5 mg total) by mouth every 6 (six) hours as needed for severe pain (pain score 7-10). Patient not taking: Reported on 09/25/2024 01/20/24   Dawley, Troy C, DO  rizatriptan  (MAXALT -MLT) 10 MG disintegrating tablet Take 1 tablet (10 mg total) by mouth as needed for migraine. May repeat in 2 hours if needed 10/19/23   Whitfield Raisin, NP  traZODone  (DESYREL ) 50 MG tablet Take 0.5-1 tablets (25-50 mg total) by mouth at bedtime as needed for sleep. 09/13/24   Eappen, Saramma, MD     Family History  Problem Relation Age of Onset   Diabetes Mother    Hypertension Mother    Heart disease Mother    Pulmonary embolism Mother    Diabetes Sister    Hypertension Sister    Heart disease Sister    Bipolar disorder Sister    Narcolepsy Brother    Dementia Maternal Aunt    Breast cancer Maternal Aunt    Diabetes Maternal Grandmother    Heart attack Maternal Grandmother    Diabetes Maternal  Grandfather    Pancreatic cancer Maternal Grandfather     Social History   Socioeconomic History   Marital status: Married    Spouse name: Dorina   Number of children: 1   Years of education: Not on file   Highest education level: Some college, no degree  Occupational History   Occupation: Disability  Tobacco Use   Smoking status: Never   Smokeless tobacco: Never  Vaping Use   Vaping status: Never Used  Substance and Sexual Activity   Alcohol use: No    Alcohol/week: 0.0 standard drinks of alcohol    Comment: rare wine on Holidays   Drug use: No   Sexual activity: Not Currently  Other Topics Concern   Not on file  Social History Narrative   Lives with husband, Emmery Seiler   Right handed    Caffeine use: tea  sometimes (mostly herbal)   Social Drivers of Corporate investment banker Strain: Low Risk  (05/15/2021)   Overall Financial Resource Strain (CARDIA)    Difficulty of Paying Living Expenses: Not very hard  Food Insecurity: No Food Insecurity (01/06/2023)   Hunger Vital Sign    Worried About Running Out of Food in the Last Year: Never true    Ran Out of Food in the Last Year: Never true  Transportation Needs: No Transportation Needs (01/06/2023)   PRAPARE - Administrator, Civil Service (Medical): No    Lack of Transportation (Non-Medical): No  Physical Activity: Not on file  Stress: Not on file  Social Connections: Not on file     Review of Systems: A 12 point ROS discussed and pertinent positives are indicated in the HPI above.  All other systems are negative.  Review of Systems  Constitutional:  Negative for fatigue and fever.  HENT:  Negative for congestion.   Respiratory:  Negative for cough and shortness of breath.   Gastrointestinal:  Negative for abdominal pain, diarrhea, nausea and vomiting.    Vital Signs: BP (!) 140/74   Pulse (!) 58   Temp 98 F (36.7 C) (Oral)   Resp 17   SpO2 99%     Physical Exam Vitals and nursing note  reviewed.  Constitutional:      Appearance: She is well-developed.  HENT:     Head: Normocephalic and atraumatic.     Mouth/Throat:     Mouth: Mucous membranes are dry.  Eyes:     Conjunctiva/sclera: Conjunctivae normal.  Cardiovascular:     Rate and Rhythm: Normal rate and regular rhythm.  Pulmonary:     Effort: Pulmonary effort is normal.  Musculoskeletal:        General: Normal range of motion.     Cervical back: Normal range of motion.  Skin:    General: Skin is warm.  Neurological:     General: No focal deficit present.     Mental Status: She is alert and oriented to person, place, and time. Mental status is at baseline.  Psychiatric:        Mood and Affect: Mood normal.        Behavior: Behavior normal.        Thought Content: Thought content normal.        Judgment: Judgment normal.     Imaging: IR Port Repair Central Venous Access Device Result Date: 09/25/2024 INDICATION: Long-term right IJ power port catheter placed 06/24/2020 for treatment of multiple sclerosis, mostly infusions, port catheter flushes but no blood return suspicious for fibrin sheath EXAM: FLUOROSCOPIC PORT CATHETER INJECTION ULTRASOUND AND FLUOROSCOPIC PORT CATHETER TIP LOOPS NEAR FIBRIN SHEATH STRIPPING MEDICATIONS: 1% lidocaine  local ANESTHESIA/SEDATION: Moderate (conscious) sedation was employed during this procedure. A total of Versed  1.5 mg and Fentanyl  75 mcg was administered intravenously by the radiology nurse. Total intra-service moderate Sedation Time: 14 minutes. The patient's level of consciousness and vital signs were monitored continuously by radiology nursing throughout the procedure under my direct supervision. FLUOROSCOPY: Radiation Exposure Index (as provided by the fluoroscopic device): 45 mGy Kerma COMPLICATIONS: None immediate. PROCEDURE: Informed written consent was obtained from the patient after a thorough discussion of the procedural risks, benefits and alternatives. All questions  were addressed. Maximal Sterile Barrier Technique was utilized including caps, mask, sterile gowns, sterile gloves, sterile drape, hand hygiene and skin antiseptic. A timeout was performed prior to the initiation of the procedure. Port catheter  injection: Initially, the right IJ port catheter was accessed under sterile conditions. Fluoroscopic injection performed. Port catheter is intact and patent. Contrast does not flow freely from the tip consistent with catheter tip fibrin sheath. Tip in the SVC RA junction. Catheter tip fibrin sheath stripping: Under sterile conditions and local anesthesia, ultrasound micropuncture access performed of the right common femoral vein. Images obtained for documentation of the right common femoral vein. Over a Bentson guidewire, 7 French 55 cm sheath was advanced into the inferior right atrium below the port catheter tip. Several 25 mm loop snare catheter fibrin sheath maneuvers performed. Following loop snare fibrin sheath stripping, there was return of blood from the port catheter site. Port catheter was flushed with saline. Final port injection confirms contrast free-flowing from the catheter tip compatible with successful fibrin sheath stripping. Port catheter was instilled with the appropriate volume and strength of heparin . Needle removed. Femoral vein access also removed. Hemostasis obtained manual compression. No immediate complication. Patient tolerated the procedure well. IMPRESSION: Successful port catheter tip fibrin sheath stripping as above. Electronically Signed   By: CHRISTELLA.  Shick M.D.   On: 09/25/2024 11:46    Labs:  CBC: Recent Labs    10/27/23 1431 01/17/24 0959 04/18/24 1030  WBC 6.4 6.3 6.4  HGB 11.6 11.8* 11.4  HCT 35.8 37.6 36.5  PLT 266 247 256    COAGS: No results for input(s): INR, APTT in the last 8760 hours.  BMP: Recent Labs    01/17/24 0959  NA 138  K 5.3*  CL 111  CO2 19*  GLUCOSE 73  BUN 13  CALCIUM 8.8*  CREATININE 0.90   GFRNONAA >60    LIVER FUNCTION TESTS: No results for input(s): BILITOT, AST, ALT, ALKPHOS, PROT, ALBUMIN in the last 8760 hours.  TUMOR MARKERS: No results for input(s): AFPTM, CEA, CA199, CHROMGRNA in the last 8760 hours.  Assessment and Plan:  63 y.o. Female. History of multiple sclerosis with RIJ portacath placed by IR on 7.19.21 for durable IV. Team states that the port flushed but they are unable to aspirate. Team is requesting a portacath evaluation.   PLAN: IR Image Guided Catheterogram with possible intervention  Risks and benefits of image guided port-a-catheter placement was discussed with the patient including, but not limited to bleeding, infection, pneumothorax, or fibrin sheath development and need for additional procedures.  All of the patient's questions were answered, patient is agreeable to proceed.  Consent signed and in chart.   Thank you for this interesting consult.  I greatly enjoyed meeting Rose Clements and look forward to participating in their care.  A copy of this report was sent to the requesting provider on this date.  Electronically Signed: Delon JAYSON Beagle, NP 09/25/2024, 12:09 PM   I spent a total of  30 Minutes   in face to face in clinical consultation, greater than 50% of which was counseling/coordinating care for

## 2024-09-25 NOTE — Progress Notes (Signed)
 Up and walked and tolerated well; right groin stable, no bleeding or hematoma

## 2024-09-25 NOTE — Procedures (Signed)
 Interventional Radiology Procedure Note  Procedure: port cath tip fibrin sheath stripping    Complications: None  Estimated Blood Loss:  0  Findings: Successful loop snare fibrin sheath stripping   Full report in pacs   EMERSON FREDERIC SPECKING, MD

## 2024-09-27 DIAGNOSIS — M6249 Contracture of muscle, multiple sites: Secondary | ICD-10-CM | POA: Diagnosis not present

## 2024-09-27 DIAGNOSIS — G35D Multiple sclerosis, unspecified: Secondary | ICD-10-CM | POA: Diagnosis not present

## 2024-10-10 ENCOUNTER — Ambulatory Visit
Admission: RE | Admit: 2024-10-10 | Discharge: 2024-10-10 | Disposition: A | Discharge status: Home / Self Care | Source: Ambulatory Visit | Attending: Nurse Practitioner | Admitting: Nurse Practitioner

## 2024-10-10 DIAGNOSIS — Z1231 Encounter for screening mammogram for malignant neoplasm of breast: Secondary | ICD-10-CM | POA: Insufficient documentation

## 2024-10-16 ENCOUNTER — Telehealth: Payer: Self-pay | Admitting: *Deleted

## 2024-10-16 ENCOUNTER — Other Ambulatory Visit

## 2024-10-16 ENCOUNTER — Other Ambulatory Visit: Payer: Self-pay

## 2024-10-16 ENCOUNTER — Other Ambulatory Visit: Payer: Self-pay | Admitting: *Deleted

## 2024-10-16 DIAGNOSIS — G35D Multiple sclerosis, unspecified: Secondary | ICD-10-CM

## 2024-10-16 DIAGNOSIS — Z79899 Other long term (current) drug therapy: Secondary | ICD-10-CM

## 2024-10-16 NOTE — Telephone Encounter (Signed)
 Placed [facility] lab in [facility] lock box for routine lab pick up. Results pending.

## 2024-10-17 LAB — CBC WITH DIFFERENTIAL/PLATELET
Basophils Absolute: 0.1 x10E3/uL (ref 0.0–0.2)
Basos: 1 %
EOS (ABSOLUTE): 0.3 x10E3/uL (ref 0.0–0.4)
Eos: 4 %
Hematocrit: 35.6 % (ref 34.0–46.6)
Hemoglobin: 11.4 g/dL (ref 11.1–15.9)
Immature Grans (Abs): 0 x10E3/uL (ref 0.0–0.1)
Immature Granulocytes: 0 %
Lymphocytes Absolute: 4.3 x10E3/uL — ABNORMAL HIGH (ref 0.7–3.1)
Lymphs: 66 %
MCH: 30.2 pg (ref 26.6–33.0)
MCHC: 32 g/dL (ref 31.5–35.7)
MCV: 94 fL (ref 79–97)
Monocytes Absolute: 0.4 x10E3/uL (ref 0.1–0.9)
Monocytes: 7 %
NRBC: 1 % — ABNORMAL HIGH (ref 0–0)
Neutrophils Absolute: 1.4 x10E3/uL (ref 1.4–7.0)
Neutrophils: 22 %
Platelets: 253 x10E3/uL (ref 150–450)
RBC: 3.77 x10E6/uL (ref 3.77–5.28)
RDW: 12.3 % (ref 11.7–15.4)
WBC: 6.5 x10E3/uL (ref 3.4–10.8)

## 2024-10-18 ENCOUNTER — Encounter: Payer: Self-pay | Admitting: Nurse Practitioner

## 2024-10-18 ENCOUNTER — Ambulatory Visit: Admitting: Nurse Practitioner

## 2024-10-18 VITALS — BP 121/80 | HR 63 | Temp 95.3°F | Resp 16 | Ht 63.0 in | Wt 191.4 lb

## 2024-10-18 DIAGNOSIS — F419 Anxiety disorder, unspecified: Secondary | ICD-10-CM

## 2024-10-18 DIAGNOSIS — G35D Multiple sclerosis, unspecified: Secondary | ICD-10-CM | POA: Diagnosis not present

## 2024-10-18 DIAGNOSIS — F331 Major depressive disorder, recurrent, moderate: Secondary | ICD-10-CM | POA: Diagnosis not present

## 2024-10-18 DIAGNOSIS — K219 Gastro-esophageal reflux disease without esophagitis: Secondary | ICD-10-CM | POA: Diagnosis not present

## 2024-10-18 DIAGNOSIS — E782 Mixed hyperlipidemia: Secondary | ICD-10-CM

## 2024-10-18 MED ORDER — PANTOPRAZOLE SODIUM 40 MG PO TBEC: 40.0000 mg | DELAYED_RELEASE_TABLET | Freq: Every day | ORAL | 1 refills | Status: AC

## 2024-10-18 NOTE — Progress Notes (Signed)
 [facility] [address] [place], [place] [address]  Internal MEDICINE  Office Visit Note  Patient Name: Rose Clements  [id number]  [id number]  Date of Service: 10/18/2024  Chief Complaint  Patient presents with   Depression   Gastroesophageal Reflux   Hypertension   Follow-up    HPI Tykiera presents for a follow-up visit for MS, GERD, anxiety and depression.  MS --managed by neurology. Has a baclofen  pump that they manage. She also gets infusions of Tysabri  which they report she tolerates well.   GERD -- controlled with pantoprazole  Depression and anxiety -- sees psychiatry. She is on duloxetine  and bupropion .  High  cholesterol -- last checked in may, her numbers are still abnormal but had improved significantly. She is not currently on statin therapy.    Current Medication: Outpatient Encounter Medications as of 10/18/2024  Medication Sig Note   baclofen  (LIORESAL ) 10 MG tablet One po tid    buPROPion  (WELLBUTRIN ) 75 MG tablet Take 1 tablet (75 mg total) by mouth in the morning.    cetirizine (ZYRTEC) 10 MG tablet Take 10 mg by mouth daily.    Cholecalciferol (VITAMIN D3) 1000 UNITS CAPS Take 1,000 Units by mouth in the morning and at bedtime.     cyanocobalamin  (VITAMIN B12) 1000 MCG tablet Take 1,000 mcg by mouth daily.    desmopressin  (DDAVP ) 0.1 MG tablet TAKE 1 TABLET BY MOUTH EVERY DAY    DULoxetine  (CYMBALTA ) 20 MG capsule TAKE ONE CAPSULE BY MOUTH DAILY, WITH 60 MG    DULoxetine  (CYMBALTA ) 60 MG capsule Take 1 capsule (60 mg total) by mouth daily. Take along with 20 mg daily    fluticasone  (FLONASE ) 50 MCG/ACT nasal spray SHAKE LIQUID AND USE 2 SPRAYS IN EACH NOSTRIL EVERY DAY    hydrOXYzine  (ATARAX ) 25 MG tablet Take 1 tablet (25 mg total) by mouth 3 (three) times daily as needed for anxiety.    natalizumab  (TYSABRI ) 300 MG/15ML injection Inject 300 mg into the vein every 30 (thirty) days.  11/18/2020: JCV ab drawn on 11/11/20 negative, index: 0.15     oxyCODONE  (OXY IR/ROXICODONE ) 5 MG immediate release tablet Take 1 tablet (5 mg total) by mouth every 6 (six) hours as needed for severe pain (pain score 7-10). (Patient not taking: Reported on 10/18/2024)    pantoprazole  (PROTONIX ) 40 MG tablet Take 1 tablet (40 mg total) by mouth daily.    polyethylene glycol (MIRALAX  / GLYCOLAX ) 17 g packet Take 17 g by mouth daily.    rizatriptan  (MAXALT -MLT) 10 MG disintegrating tablet Take 1 tablet (10 mg total) by mouth as needed for migraine. May repeat in 2 hours if needed    topiramate  (TOPAMAX ) 50 MG tablet Take 1 tablet (50 mg total) by mouth daily AND 2 tablets (100 mg total) at bedtime.    traZODone  (DESYREL ) 50 MG tablet Take 0.5-1 tablets (25-50 mg total) by mouth at bedtime as needed for sleep.    [DISCONTINUED] pantoprazole  (PROTONIX ) 40 MG tablet Take 1 tablet (40 mg total) by mouth daily.    No facility-administered encounter medications on file as of 10/18/2024.    Surgical History: Past Surgical History:  Procedure Laterality Date   ABDOMINAL HYSTERECTOMY  2003   baclofen  pump insertion Right 01/04/2018   CARPAL TUNNEL RELEASE Right 1997   CESAREAN SECTION  1989   COLONOSCOPY WITH PROPOFOL  N/A 10/28/2020   Procedure: COLONOSCOPY WITH PROPOFOL ;  Surgeon: Jinny Carmine, MD;  Location[facility];  Service: Endoscopy;  Laterality: N/A;  PRIORITY 4 port a cath   CYSTECTOMY  2002   From chest   CYSTECTOMY  1969   From Chest   ESOPHAGOGASTRODUODENOSCOPY (EGD) WITH PROPOFOL  N/A 05/19/2016   Procedure: ESOPHAGOGASTRODUODENOSCOPY (EGD) WITH PROPOFOL ;  Surgeon: Rogelia Copping, MD;  Location: [facility];  Service: Endoscopy;  Laterality: N/A;   FRACTURE SURGERY Left 2014   Fibula Fracture and dislocated ankle screws and plate placed   IR CV LINE INJECTION  09/25/2024   IR IMAGING GUIDED PORT INSERTION  06/24/2020   IR REMOVE CV FIBRIN SHEATH  09/25/2024   KIDNEY DONATION Left 2002   OVARIAN CYST REMOVAL Left 2002   Ovarian mass  removal   PAIN PUMP IMPLANTATION Right 01/20/2024   Procedure: BACLOFEN  PUMP REPLACEMENT, ABDOMEN;  Surgeon: Carollee Lani BROCKS, DO;  Location: [facility] OR;  Service: Neurosurgery;  Laterality: Right;   TONSILLECTOMY  1973   UPPER GASTROINTESTINAL ENDOSCOPY  11/19/2014   Benign appearing esophageal stricture-Dilated.    Medical History: Past Medical History:  Diagnosis Date   Anxiety    Constipation    Cystitis bacillary, chronic    Depression    Dysphagia    Dx by Sigrid Bathe on 10/15/14   Eosinophilic esophagitis 11/19/2014   GE junction benign stricture s/p balloon dilation, retained food contents. ?gastroparesis versus secondary to EE.   Esophageal dysphagia    GERD (gastroesophageal reflux disease)    Heartburn    Hypertension    Mental depression 2024   Migraine headache    couple times each month   Multilevel degenerative disc disease    Multiple sclerosis 07/16/2015   Neuromuscular disorder (HCC)    Multiple Sclerosis   Port-A-Cath in place    right side   Presence of intrathecal baclofen  pump    right abdomen   Vertigo    Rare - none in last 6 months    Family History: Family History  Problem Relation Age of Onset   Diabetes Mother    Hypertension Mother    Heart disease Mother    Pulmonary embolism Mother    Diabetes Sister    Hypertension Sister    Heart disease Sister    Bipolar disorder Sister    Narcolepsy Brother    Dementia Maternal Aunt    Breast cancer Maternal Aunt    Diabetes Maternal Grandmother    Heart attack Maternal Grandmother    Diabetes Maternal Grandfather    Pancreatic cancer Maternal Grandfather     Social History   Socioeconomic History   Marital status: Married    Spouse name: Dorina   Number of children: 1   Years of education: Not on file   Highest education level: Some college, no degree  Occupational History   Occupation: Disability  Tobacco Use   Smoking status: Never   Smokeless tobacco: Never  Vaping Use   Vaping  status: Never Used  Substance and Sexual Activity   Alcohol use: No    Alcohol/week: 0.0 standard drinks of alcohol    Comment: rare wine on Holidays   Drug use: No   Sexual activity: Not Currently  Other Topics Concern   Not on file  Social History Narrative   Lives with husband, Mckinzy Fuller   Right handed    Caffeine use: tea sometimes (mostly herbal)   Social Drivers of Health   Financial Resource Strain: Low Risk  (05/15/2021)   Overall Financial Resource Strain (CARDIA)    Difficulty of Paying Living Expenses: Not very hard  Food Insecurity:  No Food Insecurity (01/06/2023)   Hunger Vital Sign    Worried About Running Out of Food in the Last Year: Never true    Ran Out of Food in the Last Year: Never true  Transportation Needs: No Transportation Needs (01/06/2023)   PRAPARE - Administrator, Civil Service (Medical): No    Lack of Transportation (Non-Medical): No  Physical Activity: Not on file  Stress: Not on file  Social Connections: Not on file  Intimate Partner Violence: Not At Risk (01/06/2023)   Humiliation, Afraid, Rape, and Kick questionnaire    Fear of Current or Ex-Partner: No    Emotionally Abused: No    Physically Abused: No    Sexually Abused: No      Review of Systems  Constitutional:  Negative for chills, fatigue and unexpected weight change.  HENT:  Negative for congestion, rhinorrhea, sneezing and sore throat.   Eyes:  Negative for redness.  Respiratory:  Negative for cough, chest tightness and shortness of breath.   Cardiovascular:  Negative for chest pain and palpitations.  Gastrointestinal:  Negative for abdominal pain, constipation, diarrhea, nausea and vomiting.  Genitourinary:  Negative for dysuria and frequency.  Musculoskeletal:  Negative for arthralgias, back pain, joint swelling and neck pain.  Skin:  Negative for rash.  Neurological:  Positive for weakness. Negative for tremors and numbness.  Hematological:  Negative for  adenopathy. Does not bruise/bleed easily.  Psychiatric/Behavioral:  Positive for behavioral problems (Depression) and sleep disturbance. Negative for self-injury and suicidal ideas. The patient is nervous/anxious.     Vital Signs: BP 121/80   Pulse 63   Temp (!) 95.3 F (35.2 C)   Resp 16   Ht 5' 3 (1.6 m)   Wt 191 lb 6.4 oz (86.8 kg)   SpO2 97%   BMI 33.90 kg/m    Physical Exam Vitals reviewed.  Constitutional:      General: She is not in acute distress.    Appearance: Normal appearance. She is obese. She is not ill-appearing.  HENT:     Head: Normocephalic and atraumatic.  Eyes:     Pupils: Pupils are equal, round, and reactive to light.  Cardiovascular:     Heart sounds: No murmur heard. Pulmonary:     Effort: Pulmonary effort is normal. No respiratory distress.  Musculoskeletal:     Cervical back: Neck supple. No tenderness.  Lymphadenopathy:     Cervical: No cervical adenopathy.  Skin:    Capillary Refill: Capillary refill takes less than 2 seconds.  Neurological:     Mental Status: She is alert and oriented to person, place, and time.  Psychiatric:        Mood and Affect: Mood is depressed.        Behavior: Behavior normal.        Assessment/Plan: 1. Multiple sclerosis (Primary) Continue treatment regimen and follow up as directed by neurology.   2. Mixed hyperlipidemia Working on diet changes and increasing physical activity as tolerated. Will consider statin therapy next time cholesterol panel is checked.   3. Gastroesophageal reflux disease without esophagitis Stable, continue pantoprazole  as prescribed.  - pantoprazole  (PROTONIX ) 40 MG tablet; Take 1 tablet (40 mg total) by mouth daily.  Dispense: 90 tablet; Refill: 1  4. Anxiety Continue to follow up with psychiatrist. Continue duloxetine  and bupropion  as prescribed   5. Major depressive disorder, recurrent episode, moderate (HCC) Continue to follow up with psychiatrist. Continue duloxetine   and bupropion  as prescribed  General Counseling: Ifrah verbalizes understanding of the findings of todays visit and agrees with plan of treatment. I have discussed any further diagnostic evaluation that may be needed or ordered today. We also reviewed her medications today. she has been encouraged to call the office with any questions or concerns that should arise related to todays visit.    No orders of the defined types were placed in this encounter.   Meds ordered this encounter  Medications   pantoprazole  (PROTONIX ) 40 MG tablet    Sig: Take 1 tablet (40 mg total) by mouth daily.    Dispense:  90 tablet    Refill:  1    For future refills    Return for previously scheduled, AWV, Mavi Un PCP in april.   Total time spent:30 Minutes Time spent includes review of chart, medications, test results, and follow up plan with the patient.   [place] Controlled Substance Database was reviewed by me.  This patient was seen by Mardy Maxin, FNP-C in collaboration with Dr. Sigrid Bathe as a part of collaborative care agreement.   Tilman Mcclaren R. Maxin, MSN, FNP-C Internal medicine

## 2024-11-01 ENCOUNTER — Encounter: Payer: Self-pay | Admitting: Adult Health

## 2024-11-01 ENCOUNTER — Ambulatory Visit: Admitting: Adult Health

## 2024-11-01 VITALS — BP 132/89 | HR 78 | Ht 63.0 in | Wt 187.0 lb

## 2024-11-01 DIAGNOSIS — R252 Cramp and spasm: Secondary | ICD-10-CM

## 2024-11-01 DIAGNOSIS — Z95828 Presence of other vascular implants and grafts: Secondary | ICD-10-CM

## 2024-11-01 DIAGNOSIS — Z79899 Other long term (current) drug therapy: Secondary | ICD-10-CM | POA: Diagnosis not present

## 2024-11-01 DIAGNOSIS — G35D Multiple sclerosis, unspecified: Secondary | ICD-10-CM | POA: Diagnosis not present

## 2024-11-01 DIAGNOSIS — G43009 Migraine without aura, not intractable, without status migrainosus: Secondary | ICD-10-CM | POA: Diagnosis not present

## 2024-11-01 DIAGNOSIS — R269 Unspecified abnormalities of gait and mobility: Secondary | ICD-10-CM

## 2024-11-01 MED ORDER — TOPIRAMATE 50 MG PO TABS: ORAL_TABLET | ORAL | 3 refills | Status: AC

## 2024-11-01 MED ORDER — BACLOFEN 10 MG PO TABS: ORAL_TABLET | ORAL | 3 refills | Status: AC

## 2024-11-01 MED ORDER — ONDANSETRON 4 MG PO TBDP: 4.0000 mg | ORAL_TABLET | Freq: Three times a day (TID) | ORAL | 11 refills | Status: AC | PRN

## 2024-11-01 MED ORDER — RIZATRIPTAN BENZOATE 10 MG PO TBDP: 10.0000 mg | ORAL_TABLET | ORAL | 11 refills | Status: AC | PRN

## 2024-11-01 MED ORDER — DESMOPRESSIN ACETATE 0.1 MG PO TABS: 100.0000 ug | ORAL_TABLET | Freq: Every day | ORAL | 3 refills | Status: AC

## 2024-11-01 NOTE — Progress Notes (Signed)
 [facility]  PATIENT: Rose Clements DOB: 08-31-1961  REFERRING DOCTOR OR PCP:  Sigrid Bathe, MD SOURCE: Patient and chart review  _________________________________   HISTORICAL  CHIEF COMPLAINT:  Chief Complaint  Patient presents with   Multiple Sclerosis    Rm 3 alone Pt is well, reports increase in BLE pain. No other concerns.     Rose Clements is a 63 y.o. woman with relapsing multiple sclerosis.    Update 11/01/2024 Patient returns for follow-up visit.  Previously seen by Dr. Vear 04/19/2024.  She is on Tysabri  and tolerates it well.    JCV Ab was negative (0.17 04/2024).   She has not had any exacerbations.  She had some slow progression of gait disturbance and spasticity.   Ambulates with cane short distance and RW long distance. Did have a fall about 1 month ago but thankfully no injury.  She reports some worsening of LE spasms especially in the evening and at night.   Does have left hand and leg weakness.    She has some tingling in legs bilaterally, left > right, has had some progression of numbness in the bottom of feet and toes bilaterally worse at night.  She has stiffness in her legs bilaterally.  Baclofen  helps her some, usually takes 1 tab in the morning and 1 tab at bedtime. Denies side effects. She also has baclofen  pump at 1106mcg/day (dosage increased 11/2024). Did have replacement of pump in 01/2024 by Dr. Carollee.  She has vertigo at times upon awakening but quickly resolves.    LFCN pain resolved and she stopped lamotrigine .    Mood is stable.   She had a suicide attempt in 2024(running car engine in garage) and did a 5 day inpatient psychiatry stay.   She is on duloxetine   She is seeing Dr. Eappen and a therapist q 2 weeks  Migraines currently well-controlled.  She reports about 3 migraines per month on Topamax .  Use of rizatriptan  with benefit.  Migraines can be associated with nausea as well as photophobia and phonophobia.    DDAVP  with benefit  for nocturia.    Because she was a kidney donor, she has only 1 kidney.  Creatinine and BUN have remained in the normal levels over the last few years but I would be reluctant to raise the dose further  She has fatigue and sleeiness.     Modafinil  helped some but she stopped    She does not sleep well.   She snores but does not have OSA.    She reports PSG with Dr. Elfreda Bathe in [place] showed narcolepsy.  She does not described cataplexy symptoms     She has vivid dreams and occasional hypnagogic hallucinations and sleep paralysis.        MS HIstory:  She  was diagnosed with relapsing remitting MS in 2011 after she presented with stiffness in her left arm and a clumsy gait with some falls.   In retrospect some of the leg symptoms started a little earlier.    She had MRI's and LP with lesions and CSF findings consistent with MS.    She was placed on Avonex.   She did ok initially but then had an exacerbation with more issues with her legs.   She was started on Tysabri  about 2016 after these issues.   She has done well with Tysabri  and has not had any exacerbations.   Initially, she was diagnosed and treated in [place] .  She moved to  [place]  ([place] in 2016 and has seen Dr. True at [facility] since that time.  She transferred care to us  mid 2021.    IMAGING: MRI cervical spine 04/25/2023 showed no new lesions  MRI brain 04/25/2023  showed no new lesions. MRI of the cervical spine 03/07/2018 and 05/15/2018 and 01/19/2020 all show a couple T2 hyperintense lesions within the spinal cord.  One to the left adjacent to C2-C3 and 1 to the right adjacent to C4.    Also has right thyroid  cyst.   MRI of the brain 05/15/2018 and 03/07/2018 and 2/212/2021 show multiple T2/flair hyperintense foci.  Somewhat nonspecific but a few are periventricular and juxtacortical.  There is no change between the 3 studies.     MRI of the thoracic spine 05/16/2015 did not show any definite lesions.   Hemangiomas within the T2  and T6 vertebral body.    REVIEW OF SYSTEMS: Constitutional: No fevers, chills, sweats, or change in appetite Eyes: No visual changes, double vision, eye pain Ear, nose and throat: No hearing loss, ear pain, nasal congestion, sore throat Cardiovascular: No chest pain, palpitations Respiratory:  No shortness of breath at rest or with exertion.   No wheezes GastrointestinaI: No nausea, vomiting, diarrhea, abdominal pain, fecal incontinence Genitourinary:  No dysuria, urinary retention or frequency.  No nocturia. Musculoskeletal:  No neck pain, back pain Integumentary: No rash, pruritus, skin lesions Neurological: as above Psychiatric: No depression at this time.  No anxiety Endocrine: No palpitations, diaphoresis, change in appetite, change in weigh or increased thirst Hematologic/Lymphatic:  No anemia, purpura, petechiae. Allergic/Immunologic: No itchy/runny eyes, nasal congestion, recent allergic reactions, rashes  ALLERGIES: Allergies  Allergen Reactions   Lisinopril  Hives    HOME MEDICATIONS:  Current Outpatient Medications:    buPROPion  (WELLBUTRIN ) 75 MG tablet, Take 1 tablet (75 mg total) by mouth in the morning., Disp: 90 tablet, Rfl: 3   cetirizine (ZYRTEC) 10 MG tablet, Take 10 mg by mouth daily., Disp: , Rfl:    Cholecalciferol (VITAMIN D3) 1000 UNITS CAPS, Take 1,000 Units by mouth in the morning and at bedtime. , Disp: , Rfl:    cyanocobalamin  (VITAMIN B12) 1000 MCG tablet, Take 1,000 mcg by mouth daily., Disp: , Rfl:    DULoxetine  (CYMBALTA ) 20 MG capsule, TAKE ONE CAPSULE BY MOUTH DAILY, WITH 60 MG, Disp: 90 capsule, Rfl: 3   DULoxetine  (CYMBALTA ) 60 MG capsule, Take 1 capsule (60 mg total) by mouth daily. Take along with 20 mg daily, Disp: 90 capsule, Rfl: 1   fluticasone  (FLONASE ) 50 MCG/ACT nasal spray, SHAKE LIQUID AND USE 2 SPRAYS IN EACH NOSTRIL EVERY DAY, Disp: 48 g, Rfl: 3   hydrOXYzine  (ATARAX ) 25 MG tablet, Take 1 tablet (25 mg total) by mouth 3 (three)  times daily as needed for anxiety., Disp: 90 tablet, Rfl: 2   natalizumab  (TYSABRI ) 300 MG/15ML injection, Inject 300 mg into the vein every 30 (thirty) days. , Disp: , Rfl:    ondansetron  (ZOFRAN -ODT) 4 MG disintegrating tablet, Take 1 tablet (4 mg total) by mouth every 8 (eight) hours as needed., Disp: 20 tablet, Rfl: 11   pantoprazole  (PROTONIX ) 40 MG tablet, Take 1 tablet (40 mg total) by mouth daily., Disp: 90 tablet, Rfl: 1   polyethylene glycol (MIRALAX  / GLYCOLAX ) 17 g packet, Take 17 g by mouth daily., Disp: 28 each, Rfl: 1   traZODone  (DESYREL ) 50 MG tablet, Take 0.5-1 tablets (25-50 mg total) by mouth at bedtime as needed for sleep., Disp: 90 tablet, Rfl:  1   baclofen  (LIORESAL ) 10 MG tablet, One po tid, Disp: 270 tablet, Rfl: 3   desmopressin  (DDAVP ) 0.1 MG tablet, Take 1 tablet (100 mcg total) by mouth daily., Disp: 90 tablet, Rfl: 3   rizatriptan  (MAXALT -MLT) 10 MG disintegrating tablet, Take 1 tablet (10 mg total) by mouth as needed for migraine. May repeat in 2 hours if needed, Disp: 12 tablet, Rfl: 11   topiramate  (TOPAMAX ) 50 MG tablet, Take 1 tablet (50 mg total) by mouth daily AND 2 tablets (100 mg total) at bedtime., Disp: 270 tablet, Rfl: 3  PAST MEDICAL HISTORY: Past Medical History:  Diagnosis Date   Anxiety    Constipation    Cystitis bacillary, chronic    Depression    Dysphagia    Dx by Sigrid Bathe on 10/15/14   Eosinophilic esophagitis 11/19/2014   GE junction benign stricture s/p balloon dilation, retained food contents. ?gastroparesis versus secondary to EE.   Esophageal dysphagia    GERD (gastroesophageal reflux disease)    Heartburn    Hypertension    Mental depression 2024   Migraine headache    couple times each month   Multilevel degenerative disc disease    Multiple sclerosis 07/16/2015   Neuromuscular disorder (HCC)    Multiple Sclerosis   Port-A-Cath in place    right side   Presence of intrathecal baclofen  pump    right abdomen   Vertigo     Rare - none in last 6 months    PAST SURGICAL HISTORY: Past Surgical History:  Procedure Laterality Date   ABDOMINAL HYSTERECTOMY  2003   baclofen  pump insertion Right 01/04/2018   CARPAL TUNNEL RELEASE Right 1997   CESAREAN SECTION  1989   COLONOSCOPY WITH PROPOFOL  N/A 10/28/2020   Procedure: COLONOSCOPY WITH PROPOFOL ;  Surgeon: Jinny Carmine, MD;  Location: [facility];  Service: Endoscopy;  Laterality: N/A;  PRIORITY 4 port a cath   CYSTECTOMY  2002   From chest   CYSTECTOMY  1969   From Chest   ESOPHAGOGASTRODUODENOSCOPY (EGD) WITH PROPOFOL  N/A 05/19/2016   Procedure: ESOPHAGOGASTRODUODENOSCOPY (EGD) WITH PROPOFOL ;  Surgeon: Carmine Jinny, MD;  Location: [facility];  Service: Endoscopy;  Laterality: N/A;   FRACTURE SURGERY Left 2014   Fibula Fracture and dislocated ankle screws and plate placed   IR CV LINE INJECTION  09/25/2024   IR IMAGING GUIDED PORT INSERTION  06/24/2020   IR REMOVE CV FIBRIN SHEATH  09/25/2024   KIDNEY DONATION Left 2002   OVARIAN CYST REMOVAL Left 2002   Ovarian mass removal   PAIN PUMP IMPLANTATION Right 01/20/2024   Procedure: BACLOFEN  PUMP REPLACEMENT, ABDOMEN;  Surgeon: Carollee Lani BROCKS, DO;  Location: [facility] OR;  Service: Neurosurgery;  Laterality: Right;   TONSILLECTOMY  1973   UPPER GASTROINTESTINAL ENDOSCOPY  11/19/2014   Benign appearing esophageal stricture-Dilated.    FAMILY HISTORY: Family History  Problem Relation Age of Onset   Diabetes Mother    Hypertension Mother    Heart disease Mother    Pulmonary embolism Mother    Diabetes Sister    Hypertension Sister    Heart disease Sister    Bipolar disorder Sister    Narcolepsy Brother    Dementia Maternal Aunt    Breast cancer Maternal Aunt    Diabetes Maternal Grandmother    Heart attack Maternal Grandmother    Diabetes Maternal Grandfather    Pancreatic cancer Maternal Grandfather     SOCIAL HISTORY:  Social History   Socioeconomic History  Marital status: Married     Spouse name: Dorina   Number of children: 1   Years of education: Not on file   Highest education level: Some college, no degree  Occupational History   Occupation: Disability  Tobacco Use   Smoking status: Never   Smokeless tobacco: Never  Vaping Use   Vaping status: Never Used  Substance and Sexual Activity   Alcohol use: No    Alcohol/week: 0.0 standard drinks of alcohol    Comment: rare wine on Holidays   Drug use: No   Sexual activity: Not Currently  Other Topics Concern   Not on file  Social History Narrative   Lives with husband, Alima Naser   Right handed    Caffeine use: tea sometimes (mostly herbal)   Social Drivers of Corporate Investment Banker Strain: Low Risk  (05/15/2021)   Overall Financial Resource Strain (CARDIA)    Difficulty of Paying Living Expenses: Not very hard  Food Insecurity: No Food Insecurity (01/06/2023)   Hunger Vital Sign    Worried About Running Out of Food in the Last Year: Never true    Ran Out of Food in the Last Year: Never true  Transportation Needs: No Transportation Needs (01/06/2023)   PRAPARE - Administrator, Civil Service (Medical): No    Lack of Transportation (Non-Medical): No  Physical Activity: Not on file  Stress: Not on file  Social Connections: Not on file  Intimate Partner Violence: Not At Risk (01/06/2023)   Humiliation, Afraid, Rape, and Kick questionnaire    Fear of Current or Ex-Partner: No    Emotionally Abused: No    Physically Abused: No    Sexually Abused: No     PHYSICAL EXAM  Vitals:   11/01/24 0846  BP: 132/89  Pulse: 78  Weight: 187 lb (84.8 kg)  Height: 5' 3 (1.6 m)   Body mass index is 33.13 kg/m.   General: The patient is well-developed and well-nourished and in no acute distress  Skin: Extremities are without rash or edema.   Neurologic Exam  Mental status: The patient is alert and oriented x 3 at the time of the examination. The patient has apparent normal recent and  remote memory, with an apparently normal attention span and concentration ability.   Speech is normal.  Cranial nerves: Extraocular movements are full.  Facial strength was normal.  No obvious hearing deficits are noted.  Motor:  Muscle bulk is normal.   She has increased muscle tone in legs.  Muscle tone is increased, left greater than right leg.  She has reduced rapid alternating movements in the left arm and strength was 4/5 in the triceps and 5/5 elsewhere.  Strength 4/5 in legs and 3/5 L ADF.   Sensory: Sensory testing is intact to pinprick, soft touch and vibration sensation in all 4 extremities.  Coordination: Cerebellar testing reveals good finger-nose-finger and slightly reduced heel-to-shin bilaterally.  Gait and station: Station is normal. Ambulates with cane and mild imbalance, slow cautious gait with decreased stride length and step height of LLE       DIAGNOSTIC DATA (LABS, IMAGING, TESTING) - I reviewed patient records, labs, notes, testing and imaging myself where available.  Lab Results  Component Value Date   WBC 6.5 10/16/2024   HGB 11.4 10/16/2024   HCT 35.6 10/16/2024   MCV 94 10/16/2024   PLT 253 10/16/2024      Component Value Date/Time   NA 138 01/17/2024 0959  NA 143 03/31/2022 1006   K 5.3 (H) 01/17/2024 0959   CL 111 01/17/2024 0959   CO2 19 (L) 01/17/2024 0959   GLUCOSE 73 01/17/2024 0959   BUN 13 01/17/2024 0959   BUN 10 03/31/2022 1006   CREATININE 0.90 01/17/2024 0959   CALCIUM 8.8 (L) 01/17/2024 0959   PROT 7.2 01/05/2023 1547   PROT 6.5 03/31/2022 1006   ALBUMIN 4.2 01/05/2023 1547   ALBUMIN 4.5 03/31/2022 1006   AST 18 01/05/2023 1547   ALT 9 01/05/2023 1547   ALKPHOS 75 01/05/2023 1547   BILITOT 0.5 01/05/2023 1547   BILITOT 0.2 03/31/2022 1006   GFRNONAA >60 01/17/2024 0959   GFRAA 76 02/07/2020 1050   Lab Results  Component Value Date   CHOL 237 (H) 04/06/2024   HDL 76 04/06/2024   LDLCALC 149 (H) 04/06/2024   TRIG 73  04/06/2024   CHOLHDL 3.1 04/06/2024   No results found for: HGBA1C Lab Results  Component Value Date   VITAMINB12 1,309 (H) 04/06/2024   Lab Results  Component Value Date   TSH 1.230 04/06/2024       ASSESSMENT AND PLAN  Multiple sclerosis - Plan: baclofen  (LIORESAL ) 10 MG tablet  High risk medication use  Port-A-Cath in place  Migraine without aura and without status migrainosus, not intractable  Spasticity  Gait disturbance  1.   Continue Tysabri  infusions.  JCV antibody negative (04/2024)  She now has a central line.   2.    Continue topiramate  50/100 and maxalt  prn for migraine.  Start zofran  as needed 3.   Stay active and exercise as tolerated. 4.   She will  continue low-dose desmopressin  at night which has helped her nocturia.    5.   She has a baclofen  pump infusing at 123mcg/day [facility] is now doing.   6.   Continue baclofen  PO as needed, can try to take evening dose and bedtime dose to help with worsening nighttime spasms. Also discussed trying magnesium  at night.  7.    Return for follow-up in 6 months  or call sooner if new or worsening neurologic symptoms.       I personally spent a total of 30 minutes in the care of the patient today including preparing to see the patient, performing a medically appropriate exam/evaluation, counseling and educating, placing orders, and documenting clinical information in the EHR.  Harlene Bogaert, AGNP-BC  [facility] [address] [place], [place] [address]  Phone [phone] Fax [phone] Note: This document was prepared with digital dictation and possible smart phrase technology. Any transcriptional errors that result from this process are unintentional.

## 2024-11-01 NOTE — Patient Instructions (Signed)
 Your Plan:  Continue current medications - refills provided   Try to take baclofen  3 times per day, can take 2nd dose in the evening and 3rd dose at bedtime to see if this helps your night time pains  Can also try magnesium  oxide or glycinate which may further help with cramping and sleep     Follow up in 6 months or call earlier if needed     Thank you for coming to see us  at [facility]. I hope we have been able to provide you high quality care today.  You may receive a patient satisfaction survey over the next few weeks. We would appreciate your feedback and comments so that we may continue to improve ourselves and the health of our patients.

## 2024-11-01 NOTE — Progress Notes (Signed)
 [name]

## 2024-11-28 ENCOUNTER — Encounter: Payer: Self-pay | Admitting: Nurse Practitioner

## 2024-11-28 ENCOUNTER — Encounter: Payer: Self-pay | Admitting: Neurology

## 2024-12-12 ENCOUNTER — Other Ambulatory Visit: Payer: Self-pay

## 2024-12-12 ENCOUNTER — Ambulatory Visit: Admitting: Psychiatry

## 2024-12-12 ENCOUNTER — Encounter: Payer: Self-pay | Admitting: Psychiatry

## 2024-12-12 VITALS — BP 136/84 | HR 78 | Temp 97.5°F | Ht 63.0 in | Wt 194.6 lb

## 2024-12-12 DIAGNOSIS — Z634 Disappearance and death of family member: Secondary | ICD-10-CM | POA: Insufficient documentation

## 2024-12-12 DIAGNOSIS — F3342 Major depressive disorder, recurrent, in full remission: Secondary | ICD-10-CM | POA: Diagnosis not present

## 2024-12-12 DIAGNOSIS — G4701 Insomnia due to medical condition: Secondary | ICD-10-CM | POA: Diagnosis not present

## 2024-12-12 NOTE — Progress Notes (Unsigned)
 [facility] MD OP Progress Note  12/12/2024 3:31 PM Rose Clements  MRN:  [id number]  Chief Complaint:  Chief Complaint  Patient presents with   Follow-up   Depression   Anxiety   Medication Refill   Discussed the use of AI scribe software for clinical note transcription with the patient, who gave verbal consent to proceed.  History of Present Illness Rose Clements is a 64 year old African-American female, married, on SSD, lives in [place], has a history of MDD, multiple sclerosis on intrathecal baclofen  pump, migraine headaches, hypertension was evaluated in office today for a follow-up appointment.  Significant grief has affected her following the recent deaths of her mother's sister on December 12th, her father's sister shortly after, and her friend's mother in November. She describes these losses as emotionally challenging, noting close relationships with all three women and considering her aunt like a mother. She states that her family maintained strong connections.  To cope with her grief, she continues to engage in therapy, discussing these losses with her therapist and maintaining regular appointments. She uses hydroxyzine  as needed for anxiety, noting that she has taken it a couple of times recently but feels she manages well overall.  She denies experiencing deep or significant depression, lack of motivation, or inability to enjoy activities. She notes feeling down a couple of days per week, but not to the extreme as in the past. She reports that her sleep has improved, as she falls asleep more easily and wakes up feeling rested, though occasional nights of wakefulness persist.  She reports no recent changes to her medication regimen and confirms current use of bupropion  75 mg, Cymbalta  20 mg plus 60 mg, hydroxyzine  as needed, and trazodone  25-50 mg at bedtime as needed.   She reports her husband has been supportive.    Visit Diagnosis:    ICD-10-CM   1. MDD (major depressive  disorder), recurrent, in full remission  F33.42     2. Insomnia due to medical condition  G47.01    Mood, rule out sleep apnea, multiple sclerosis    3. Bereavement  Z63.4       Past Psychiatric History: I have reviewed past psychiatric history from progress note on 03/18/2023.  Past inpatient admission dated 01/07/2023 - 01/10/2023 after suicide attempt.  Multiple psychiatric medication trials in the past including modafinil .  Past Medical History:  Past Medical History:  Diagnosis Date   Anxiety    Constipation    Cystitis bacillary, chronic    Depression    Dysphagia    Dx by Sigrid Bathe on 10/15/14   Eosinophilic esophagitis 11/19/2014   GE junction benign stricture s/p balloon dilation, retained food contents. ?gastroparesis versus secondary to EE.   Esophageal dysphagia    GERD (gastroesophageal reflux disease)    Heartburn    Hypertension    Mental depression 2024   Migraine headache    couple times each month   Multilevel degenerative disc disease    Multiple sclerosis 07/16/2015   Neuromuscular disorder (HCC)    Multiple Sclerosis   Port-A-Cath in place    right side   Presence of intrathecal baclofen  pump    right abdomen   Vertigo    Rare - none in last 6 months    Past Surgical History:  Procedure Laterality Date   ABDOMINAL HYSTERECTOMY  2003   baclofen  pump insertion Right 01/04/2018   CARPAL TUNNEL RELEASE Right 1997   CESAREAN SECTION  1989   COLONOSCOPY WITH PROPOFOL  N/A 10/28/2020  Procedure: COLONOSCOPY WITH PROPOFOL ;  Surgeon: Jinny Carmine, MD;  Location: [facility];  Service: Endoscopy;  Laterality: N/A;  PRIORITY 4 port a cath   CYSTECTOMY  2002   From chest   CYSTECTOMY  1969   From Chest   ESOPHAGOGASTRODUODENOSCOPY (EGD) WITH PROPOFOL  N/A 05/19/2016   Procedure: ESOPHAGOGASTRODUODENOSCOPY (EGD) WITH PROPOFOL ;  Surgeon: Carmine Jinny, MD;  Location: [facility];  Service: Endoscopy;  Laterality: N/A;   FRACTURE SURGERY Left 2014    Fibula Fracture and dislocated ankle screws and plate placed   IR CV LINE INJECTION  09/25/2024   IR IMAGING GUIDED PORT INSERTION  06/24/2020   IR REMOVE CV FIBRIN SHEATH  09/25/2024   KIDNEY DONATION Left 2002   OVARIAN CYST REMOVAL Left 2002   Ovarian mass removal   PAIN PUMP IMPLANTATION Right 01/20/2024   Procedure: BACLOFEN  PUMP REPLACEMENT, ABDOMEN;  Surgeon: Carollee Lani BROCKS, DO;  Location: [facility] OR;  Service: Neurosurgery;  Laterality: Right;   TONSILLECTOMY  1973   UPPER GASTROINTESTINAL ENDOSCOPY  11/19/2014   Benign appearing esophageal stricture-Dilated.    Family Psychiatric History: Reviewed family psychiatric history from progress note on 03/18/2023.  Family History:  Family History  Problem Relation Age of Onset   Diabetes Mother    Hypertension Mother    Heart disease Mother    Pulmonary embolism Mother    Diabetes Sister    Hypertension Sister    Heart disease Sister    Bipolar disorder Sister    Narcolepsy Brother    Dementia Maternal Aunt    Breast cancer Maternal Aunt    Diabetes Maternal Grandmother    Heart attack Maternal Grandmother    Diabetes Maternal Grandfather    Pancreatic cancer Maternal Grandfather     Social History: I have reviewed social history from progress note on 03/18/2023. Social History   Socioeconomic History   Marital status: Married    Spouse name: Rose Clements   Number of children: 1   Years of education: Not on file   Highest education level: Some college, no degree  Occupational History   Occupation: Disability  Tobacco Use   Smoking status: Never   Smokeless tobacco: Never  Vaping Use   Vaping status: Never Used  Substance and Sexual Activity   Alcohol use: No    Alcohol/week: 0.0 standard drinks of alcohol    Comment: rare wine on Holidays   Drug use: No   Sexual activity: Not Currently  Other Topics Concern   Not on file  Social History Narrative   Lives with husband, Rose Clements   Right handed    Caffeine use: tea  sometimes (mostly herbal)   Social Drivers of Health   Tobacco Use: Low Risk (12/12/2024)   Patient History    Smoking Tobacco Use: Never    Smokeless Tobacco Use: Never    Passive Exposure: Not on file  Financial Resource Strain: Not on file  Food Insecurity: No Food Insecurity (01/06/2023)   Hunger Vital Sign    Worried About Running Out of Food in the Last Year: Never true    Ran Out of Food in the Last Year: Never true  Transportation Needs: No Transportation Needs (01/06/2023)   PRAPARE - Administrator, Civil Service (Medical): No    Lack of Transportation (Non-Medical): No  Physical Activity: Not on file  Stress: Not on file  Social Connections: Not on file  Depression (PHQ2-9): Low Risk (12/12/2024)   Depression (PHQ2-9)    PHQ-2  Score: 1  Alcohol Screen: Low Risk (04/01/2023)   Alcohol Screen    Last Alcohol Screening Score (AUDIT): 1  Housing: Low Risk (01/06/2023)   Housing    Last Housing Risk Score: 0  Utilities: Not At Risk (01/06/2023)   AHC Utilities    Threatened with loss of utilities: No  Health Literacy: Not on file    Allergies: Allergies[1]  Metabolic Disorder Labs: No results found for: HGBA1C, MPG No results found for: PROLACTIN Lab Results  Component Value Date   CHOL 237 (H) 04/06/2024   TRIG 73 04/06/2024   HDL 76 04/06/2024   CHOLHDL 3.1 04/06/2024   LDLCALC 149 (H) 04/06/2024   LDLCALC 178 (H) 03/31/2022   Lab Results  Component Value Date   TSH 1.230 04/06/2024   TSH 2.320 03/31/2022    Therapeutic Level Labs: No results found for: LITHIUM No results found for: VALPROATE No results found for: CBMZ  Current Medications: Current Outpatient Medications  Medication Sig Dispense Refill   baclofen  (LIORESAL ) 10 MG tablet One po tid 270 tablet 3   buPROPion  (WELLBUTRIN ) 75 MG tablet Take 1 tablet (75 mg total) by mouth in the morning. 90 tablet 3   cetirizine (ZYRTEC) 10 MG tablet Take 10 mg by mouth daily.      Cholecalciferol (VITAMIN D3) 1000 UNITS CAPS Take 1,000 Units by mouth in the morning and at bedtime.      cyanocobalamin  (VITAMIN B12) 1000 MCG tablet Take 1,000 mcg by mouth daily.     desmopressin  (DDAVP ) 0.1 MG tablet Take 1 tablet (100 mcg total) by mouth daily. 90 tablet 3   DULoxetine  (CYMBALTA ) 20 MG capsule TAKE ONE CAPSULE BY MOUTH DAILY, WITH 60 MG 90 capsule 3   DULoxetine  (CYMBALTA ) 60 MG capsule Take 1 capsule (60 mg total) by mouth daily. Take along with 20 mg daily 90 capsule 1   fluticasone  (FLONASE ) 50 MCG/ACT nasal spray SHAKE LIQUID AND USE 2 SPRAYS IN EACH NOSTRIL EVERY DAY 48 g 3   hydrOXYzine  (ATARAX ) 25 MG tablet Take 1 tablet (25 mg total) by mouth 3 (three) times daily as needed for anxiety. 90 tablet 2   natalizumab  (TYSABRI ) 300 MG/15ML injection Inject 300 mg into the vein every 30 (thirty) days.      ondansetron  (ZOFRAN -ODT) 4 MG disintegrating tablet Take 1 tablet (4 mg total) by mouth every 8 (eight) hours as needed. 20 tablet 11   pantoprazole  (PROTONIX ) 40 MG tablet Take 1 tablet (40 mg total) by mouth daily. 90 tablet 1   polyethylene glycol (MIRALAX  / GLYCOLAX ) 17 g packet Take 17 g by mouth daily. 28 each 1   rizatriptan  (MAXALT -MLT) 10 MG disintegrating tablet Take 1 tablet (10 mg total) by mouth as needed for migraine. May repeat in 2 hours if needed 12 tablet 11   topiramate  (TOPAMAX ) 50 MG tablet Take 1 tablet (50 mg total) by mouth daily AND 2 tablets (100 mg total) at bedtime. 270 tablet 3   traZODone  (DESYREL ) 50 MG tablet Take 0.5-1 tablets (25-50 mg total) by mouth at bedtime as needed for sleep. 90 tablet 1   No current facility-administered medications for this visit.     Musculoskeletal: Strength & Muscle Tone: within normal limits Gait & Station: walks with walker Patient leans: Front  Psychiatric Specialty Exam: Review of Systems  Psychiatric/Behavioral:         Grieving    Blood pressure 136/84, pulse 78, temperature (!) 97.5 F (36.4  C), temperature source Temporal,  height 5' 3 (1.6 m), weight 194 lb 9.6 oz (88.3 kg).Body mass index is 34.47 kg/m.  General Appearance: Casual  Eye Contact:  Fair  Speech:  Clear and Coherent  Volume:  Normal  Mood:  grieving  Affect:  Appropriate  Thought Process:  Goal Directed and Descriptions of Associations: Intact  Orientation:  Full (Time, Place, and Person)  Thought Content: Logical   Suicidal Thoughts:  No  Homicidal Thoughts:  No  Memory:  Immediate;   Fair Recent;   Fair Remote;   Fair  Judgement:  Fair  Insight:  Fair  Psychomotor Activity:  Normal  Concentration:  Concentration: Fair and Attention Span: Fair  Recall:  [employer] of Knowledge: Fair  Language: Fair  Akathisia:  No  Handed:  Right  AIMS (if indicated): not done  Assets:  Communication Skills Desire for Improvement Housing Social Support Transportation  ADL's:  Intact  Cognition: WNL  Sleep:  Fair   Screenings: AIMS    Flowsheet Row Office Visit from 06/13/2024 in [facility]  AIMS Total Score 0   AUDIT    Flowsheet Row Admission (Discharged) from 01/06/2023 in [facility]  Alcohol Use Disorder Identification Test Final Score (AUDIT) 0   GAD-7    Flowsheet Row Office Visit from 12/12/2024 in [facility] Office Visit from 09/13/2024 in [facility] Office Visit from 06/13/2024 in [facility] Office Visit from 02/23/2024 in [facility] Office Visit from 08/25/2023 in [facility]  Total GAD-7 Score 6 2 4 4 4    Mini-Mental    Flowsheet Row Office Visit from 04/04/2024 in [facility] Office Visit from 04/01/2023 in [facility] Office Visit from 03[facility]26/2022 in [facility], [facility] Clinical Support  from 03/03/2021 in [facility]  Total Score (max 30 points ) 30 30 30 30    PHQ2-9    Flowsheet Row Office Visit from 12/12/2024 in [facility] Office Visit from 09/13/2024 in [facility] Office Visit from 06/13/2024 in [facility] Office Visit from 02/23/2024 in [facility] Office Visit from 11/25/2023 in [facility]  PHQ-2 Total Score 1 1 1 2 1   PHQ-9 Total Score -- -- -- 9 --   Flowsheet Row Office Visit from 09/13/2024 in [facility] Office Visit from 06/13/2024 in [facility] Office Visit from 02/23/2024 in [facility]  C-SSRS RISK CATEGORY Moderate Risk Moderate Risk Moderate Risk     Assessment and Plan: Megan Presti is a 64 year old African-American female who has a history of depression, insomnia was evaluated in office today.  Discussed assessment and plan as noted below.    1. MDD (major depressive disorder), recurrent, in full remission Currently denies any significant depression symptoms Continue Duloxetine  80 mg daily in the morning Continue Wellbutrin  75 mg daily  2. Insomnia due to medical condition-improving Currently reports sleep is overall improved. Continue Trazodone  25-50 mg at bedtime as needed  3. Bereavement-improving Currently reports coping with grief and is motivated to stay in psychotherapy Continue psychotherapy sessions with Ms. Ellouise Hummer Continue Hydroxyzine  25 mg 3 times a day as needed  Follow-up Follow-up in clinic in 3 months or sooner  if needed.  Collaboration of Care: Collaboration of Care: Referral or follow-up with counselor/therapist AEB encouraged to continue psychotherapy sessions  Patient/Guardian was  advised Release of Information must be obtained prior to any record release in order to collaborate their care with an outside provider. Patient/Guardian was advised if they have not already done so to contact the registration department to sign all necessary forms in order for us  to release information regarding their care.   Consent: Patient/Guardian gives verbal consent for treatment and assignment of benefits for services provided during this visit. Patient/Guardian expressed understanding and agreed to proceed.   This note was generated in part or whole with voice recognition software. Voice recognition is usually quite accurate but there are transcription errors that can and very often do occur. I apologize for any typographical errors that were not detected and corrected.    Krupa Stege, MD 12/12/2024, 3:31 PM     [1]  Allergies Allergen Reactions   Lisinopril  Hives

## 2024-12-21 ENCOUNTER — Telehealth: Payer: Self-pay | Admitting: *Deleted

## 2024-12-21 ENCOUNTER — Other Ambulatory Visit: Payer: Self-pay | Admitting: Psychiatry

## 2024-12-21 DIAGNOSIS — Z630 Problems in relationship with spouse or partner: Secondary | ICD-10-CM

## 2024-12-21 NOTE — Telephone Encounter (Signed)
 [employer] touch form filled out and placed in Dr.Sater in basket to signed to be uploaded in chart.

## 2024-12-21 NOTE — Telephone Encounter (Signed)
 Form still pending to be signed

## 2024-12-21 NOTE — Telephone Encounter (Signed)
Fax conformation received.

## 2024-12-25 NOTE — Telephone Encounter (Addendum)
 [name]

## 2025-03-15 ENCOUNTER — Ambulatory Visit: Admitting: Psychiatry

## 2025-04-05 ENCOUNTER — Ambulatory Visit: Admitting: Nurse Practitioner

## 2025-06-07 ENCOUNTER — Ambulatory Visit: Admitting: Neurology
